# Patient Record
Sex: Female | Born: 1940 | Race: White | Hispanic: No | Marital: Single | Smoking: Never smoker
Health system: Southern US, Community
[De-identification: ages and names within clinical notes are randomized; demographics above are authoritative.]

## PROBLEM LIST (undated history)

## (undated) DIAGNOSIS — M1990 Unspecified osteoarthritis, unspecified site: Secondary | ICD-10-CM

## (undated) DIAGNOSIS — K219 Gastro-esophageal reflux disease without esophagitis: Secondary | ICD-10-CM

## (undated) DIAGNOSIS — F0390 Unspecified dementia without behavioral disturbance: Secondary | ICD-10-CM

## (undated) DIAGNOSIS — R1310 Dysphagia, unspecified: Secondary | ICD-10-CM

## (undated) DIAGNOSIS — N189 Chronic kidney disease, unspecified: Secondary | ICD-10-CM

## (undated) DIAGNOSIS — I10 Essential (primary) hypertension: Secondary | ICD-10-CM

## (undated) DIAGNOSIS — F419 Anxiety disorder, unspecified: Secondary | ICD-10-CM

## (undated) DIAGNOSIS — R4182 Altered mental status, unspecified: Secondary | ICD-10-CM

## (undated) DIAGNOSIS — E162 Hypoglycemia, unspecified: Secondary | ICD-10-CM

## (undated) DIAGNOSIS — E785 Hyperlipidemia, unspecified: Secondary | ICD-10-CM

## (undated) DIAGNOSIS — F329 Major depressive disorder, single episode, unspecified: Secondary | ICD-10-CM

## (undated) DIAGNOSIS — F32A Depression, unspecified: Secondary | ICD-10-CM

## (undated) DIAGNOSIS — F319 Bipolar disorder, unspecified: Secondary | ICD-10-CM

## (undated) DIAGNOSIS — R600 Localized edema: Secondary | ICD-10-CM

## (undated) DIAGNOSIS — D696 Thrombocytopenia, unspecified: Secondary | ICD-10-CM

## (undated) DIAGNOSIS — R531 Weakness: Secondary | ICD-10-CM

## (undated) HISTORY — DX: Hyperlipidemia, unspecified: E78.5

## (undated) HISTORY — DX: Unspecified osteoarthritis, unspecified site: M19.90

## (undated) HISTORY — DX: Essential (primary) hypertension: I10

## (undated) HISTORY — DX: Major depressive disorder, single episode, unspecified: F32.9

## (undated) HISTORY — DX: Depression, unspecified: F32.A

## (undated) HISTORY — PX: ABDOMINAL HYSTERECTOMY: SHX81

---

## 1987-03-16 HISTORY — PX: BACK SURGERY: SHX140

## 1993-08-15 HISTORY — PX: CHOLECYSTECTOMY: SHX55

## 1995-04-04 HISTORY — PX: OTHER SURGICAL HISTORY: SHX169

## 1995-04-04 HISTORY — PX: PARTIAL HYSTERECTOMY: SHX80

## 1995-05-16 HISTORY — PX: BREAST BIOPSY: SHX20

## 1997-04-28 HISTORY — PX: ANKLE SURGERY: SHX546

## 2001-02-05 ENCOUNTER — Encounter (HOSPITAL_COMMUNITY): Admission: RE | Admit: 2001-02-05 | Discharge: 2001-03-07 | Payer: Self-pay | Admitting: Family Medicine

## 2001-05-27 ENCOUNTER — Encounter: Payer: Self-pay | Admitting: Specialist

## 2001-05-27 ENCOUNTER — Ambulatory Visit (HOSPITAL_COMMUNITY): Admission: RE | Admit: 2001-05-27 | Discharge: 2001-05-27 | Payer: Self-pay | Admitting: *Deleted

## 2002-06-02 ENCOUNTER — Encounter: Payer: Self-pay | Admitting: Specialist

## 2002-06-02 ENCOUNTER — Ambulatory Visit (HOSPITAL_COMMUNITY): Admission: RE | Admit: 2002-06-02 | Discharge: 2002-06-02 | Payer: Self-pay | Admitting: Specialist

## 2002-10-26 ENCOUNTER — Ambulatory Visit (HOSPITAL_COMMUNITY): Admission: RE | Admit: 2002-10-26 | Discharge: 2002-10-26 | Payer: Self-pay | Admitting: Internal Medicine

## 2002-10-26 HISTORY — PX: COLONOSCOPY: SHX174

## 2003-10-10 ENCOUNTER — Ambulatory Visit (HOSPITAL_COMMUNITY): Admission: RE | Admit: 2003-10-10 | Discharge: 2003-10-10 | Payer: Self-pay | Admitting: Specialist

## 2003-11-02 ENCOUNTER — Ambulatory Visit (HOSPITAL_COMMUNITY): Admission: RE | Admit: 2003-11-02 | Discharge: 2003-11-02 | Payer: Self-pay | Admitting: Specialist

## 2004-01-30 ENCOUNTER — Ambulatory Visit: Payer: Self-pay | Admitting: *Deleted

## 2004-07-24 ENCOUNTER — Ambulatory Visit: Payer: Self-pay | Admitting: Psychiatry

## 2004-10-10 ENCOUNTER — Ambulatory Visit (HOSPITAL_COMMUNITY): Admission: RE | Admit: 2004-10-10 | Discharge: 2004-10-10 | Payer: Self-pay | Admitting: Specialist

## 2005-01-22 ENCOUNTER — Ambulatory Visit: Payer: Self-pay | Admitting: Psychiatry

## 2005-04-23 ENCOUNTER — Ambulatory Visit: Payer: Self-pay | Admitting: Psychiatry

## 2005-07-18 ENCOUNTER — Ambulatory Visit (HOSPITAL_COMMUNITY): Payer: Self-pay | Admitting: Psychiatry

## 2005-08-13 ENCOUNTER — Ambulatory Visit (HOSPITAL_COMMUNITY): Admission: RE | Admit: 2005-08-13 | Discharge: 2005-08-13 | Payer: Self-pay | Admitting: Family Medicine

## 2005-09-01 ENCOUNTER — Encounter: Admission: RE | Admit: 2005-09-01 | Discharge: 2005-09-01 | Payer: Self-pay | Admitting: Neurology

## 2005-10-15 ENCOUNTER — Ambulatory Visit (HOSPITAL_COMMUNITY): Payer: Self-pay | Admitting: Psychiatry

## 2005-11-05 ENCOUNTER — Encounter (HOSPITAL_COMMUNITY): Admission: RE | Admit: 2005-11-05 | Discharge: 2005-12-05 | Payer: Self-pay | Admitting: Neurology

## 2005-11-19 ENCOUNTER — Ambulatory Visit (HOSPITAL_COMMUNITY): Admission: RE | Admit: 2005-11-19 | Discharge: 2005-11-19 | Payer: Self-pay | Admitting: Family Medicine

## 2005-12-05 ENCOUNTER — Encounter (HOSPITAL_COMMUNITY): Admission: RE | Admit: 2005-12-05 | Discharge: 2006-01-04 | Payer: Self-pay | Admitting: Neurology

## 2006-01-09 ENCOUNTER — Ambulatory Visit (HOSPITAL_COMMUNITY): Payer: Self-pay | Admitting: Psychiatry

## 2006-05-01 ENCOUNTER — Ambulatory Visit (HOSPITAL_COMMUNITY): Payer: Self-pay | Admitting: Psychiatry

## 2006-08-28 ENCOUNTER — Ambulatory Visit (HOSPITAL_COMMUNITY): Payer: Self-pay | Admitting: Psychiatry

## 2006-11-18 ENCOUNTER — Ambulatory Visit (HOSPITAL_COMMUNITY): Payer: Self-pay | Admitting: Psychiatry

## 2006-12-05 ENCOUNTER — Ambulatory Visit (HOSPITAL_COMMUNITY): Admission: RE | Admit: 2006-12-05 | Discharge: 2006-12-05 | Payer: Self-pay | Admitting: Family Medicine

## 2007-01-08 ENCOUNTER — Ambulatory Visit (HOSPITAL_COMMUNITY): Admission: RE | Admit: 2007-01-08 | Discharge: 2007-01-08 | Payer: Self-pay | Admitting: Family Medicine

## 2007-01-22 ENCOUNTER — Ambulatory Visit (HOSPITAL_COMMUNITY): Admission: RE | Admit: 2007-01-22 | Discharge: 2007-01-22 | Payer: Self-pay | Admitting: Family Medicine

## 2007-02-05 ENCOUNTER — Encounter (HOSPITAL_COMMUNITY): Admission: RE | Admit: 2007-02-05 | Discharge: 2007-02-10 | Payer: Self-pay

## 2007-02-11 ENCOUNTER — Ambulatory Visit: Payer: Self-pay | Admitting: Orthopedic Surgery

## 2007-02-12 ENCOUNTER — Ambulatory Visit (HOSPITAL_COMMUNITY): Payer: Self-pay | Admitting: Psychiatry

## 2007-02-13 DIAGNOSIS — Z8679 Personal history of other diseases of the circulatory system: Secondary | ICD-10-CM | POA: Insufficient documentation

## 2007-02-18 ENCOUNTER — Ambulatory Visit: Payer: Self-pay | Admitting: Orthopedic Surgery

## 2007-02-27 ENCOUNTER — Ambulatory Visit (HOSPITAL_COMMUNITY): Admission: RE | Admit: 2007-02-27 | Discharge: 2007-02-27 | Payer: Self-pay | Admitting: Orthopedic Surgery

## 2007-02-27 ENCOUNTER — Ambulatory Visit: Payer: Self-pay | Admitting: Orthopedic Surgery

## 2007-03-03 ENCOUNTER — Ambulatory Visit: Payer: Self-pay | Admitting: Orthopedic Surgery

## 2007-03-03 DIAGNOSIS — L02419 Cutaneous abscess of limb, unspecified: Secondary | ICD-10-CM

## 2007-03-03 DIAGNOSIS — L03119 Cellulitis of unspecified part of limb: Secondary | ICD-10-CM

## 2007-03-11 ENCOUNTER — Ambulatory Visit: Payer: Self-pay | Admitting: Orthopedic Surgery

## 2007-03-16 ENCOUNTER — Ambulatory Visit: Payer: Self-pay | Admitting: Orthopedic Surgery

## 2007-03-19 ENCOUNTER — Encounter: Payer: Self-pay | Admitting: Orthopedic Surgery

## 2007-03-23 ENCOUNTER — Ambulatory Visit: Payer: Self-pay | Admitting: Orthopedic Surgery

## 2007-04-06 ENCOUNTER — Ambulatory Visit: Payer: Self-pay | Admitting: Orthopedic Surgery

## 2007-04-08 ENCOUNTER — Encounter: Payer: Self-pay | Admitting: Orthopedic Surgery

## 2007-04-14 ENCOUNTER — Ambulatory Visit (HOSPITAL_COMMUNITY): Payer: Self-pay | Admitting: Psychiatry

## 2007-04-17 ENCOUNTER — Encounter: Payer: Self-pay | Admitting: Orthopedic Surgery

## 2007-05-04 ENCOUNTER — Ambulatory Visit: Payer: Self-pay | Admitting: Orthopedic Surgery

## 2007-05-12 ENCOUNTER — Telehealth: Payer: Self-pay | Admitting: Orthopedic Surgery

## 2007-06-04 ENCOUNTER — Ambulatory Visit: Payer: Self-pay | Admitting: Orthopedic Surgery

## 2007-06-16 ENCOUNTER — Encounter: Payer: Self-pay | Admitting: Orthopedic Surgery

## 2007-07-28 ENCOUNTER — Ambulatory Visit (HOSPITAL_COMMUNITY): Payer: Self-pay | Admitting: Psychiatry

## 2007-09-24 ENCOUNTER — Ambulatory Visit (HOSPITAL_COMMUNITY): Payer: Self-pay | Admitting: Psychiatry

## 2007-12-08 ENCOUNTER — Ambulatory Visit (HOSPITAL_COMMUNITY): Admission: RE | Admit: 2007-12-08 | Discharge: 2007-12-08 | Payer: Self-pay | Admitting: Family Medicine

## 2007-12-24 ENCOUNTER — Ambulatory Visit (HOSPITAL_COMMUNITY): Payer: Self-pay | Admitting: Psychiatry

## 2008-03-24 ENCOUNTER — Ambulatory Visit (HOSPITAL_COMMUNITY): Payer: Self-pay | Admitting: Psychiatry

## 2008-06-23 ENCOUNTER — Ambulatory Visit (HOSPITAL_COMMUNITY): Payer: Self-pay | Admitting: Psychiatry

## 2008-10-11 ENCOUNTER — Ambulatory Visit (HOSPITAL_COMMUNITY): Payer: Self-pay | Admitting: Psychiatry

## 2008-12-12 ENCOUNTER — Ambulatory Visit (HOSPITAL_COMMUNITY): Admission: RE | Admit: 2008-12-12 | Discharge: 2008-12-12 | Payer: Self-pay | Admitting: Family Medicine

## 2009-01-10 ENCOUNTER — Ambulatory Visit (HOSPITAL_COMMUNITY): Payer: Self-pay | Admitting: Psychiatry

## 2009-02-24 ENCOUNTER — Ambulatory Visit (HOSPITAL_COMMUNITY): Admission: RE | Admit: 2009-02-24 | Discharge: 2009-02-24 | Payer: Self-pay | Admitting: Family Medicine

## 2009-02-24 HISTORY — PX: WRIST SURGERY: SHX841

## 2009-04-11 ENCOUNTER — Ambulatory Visit (HOSPITAL_COMMUNITY): Payer: Self-pay | Admitting: Psychiatry

## 2009-07-11 ENCOUNTER — Ambulatory Visit (HOSPITAL_COMMUNITY): Payer: Self-pay | Admitting: Psychiatry

## 2009-08-24 ENCOUNTER — Emergency Department (HOSPITAL_COMMUNITY): Admission: EM | Admit: 2009-08-24 | Discharge: 2009-08-24 | Payer: Self-pay | Admitting: Emergency Medicine

## 2009-10-10 ENCOUNTER — Ambulatory Visit (HOSPITAL_COMMUNITY): Payer: Self-pay | Admitting: Psychiatry

## 2009-11-30 ENCOUNTER — Emergency Department (HOSPITAL_COMMUNITY)
Admission: EM | Admit: 2009-11-30 | Discharge: 2009-11-30 | Payer: Self-pay | Source: Home / Self Care | Admitting: Emergency Medicine

## 2009-12-15 ENCOUNTER — Ambulatory Visit (HOSPITAL_COMMUNITY): Admission: RE | Admit: 2009-12-15 | Discharge: 2009-12-15 | Payer: Self-pay | Admitting: Family Medicine

## 2010-01-09 ENCOUNTER — Ambulatory Visit (HOSPITAL_COMMUNITY): Payer: Self-pay | Admitting: Psychiatry

## 2010-04-10 ENCOUNTER — Ambulatory Visit (HOSPITAL_COMMUNITY): Payer: Self-pay | Admitting: Psychiatry

## 2010-06-03 ENCOUNTER — Encounter: Payer: Self-pay | Admitting: Specialist

## 2010-06-03 ENCOUNTER — Encounter: Payer: Self-pay | Admitting: Family Medicine

## 2010-07-10 ENCOUNTER — Encounter (INDEPENDENT_AMBULATORY_CARE_PROVIDER_SITE_OTHER): Payer: BC Managed Care – PPO | Admitting: Psychiatry

## 2010-07-10 ENCOUNTER — Encounter (HOSPITAL_COMMUNITY): Payer: Medicare Other | Admitting: Psychiatry

## 2010-07-10 DIAGNOSIS — F331 Major depressive disorder, recurrent, moderate: Secondary | ICD-10-CM

## 2010-09-25 NOTE — Op Note (Signed)
NAME:  Amy Clay, DEMMA              ACCOUNT NO.:  [id number]   MEDICAL RECORD NO.:  [id number]          PATIENT TYPE:  AMB   LOCATION:  DAY                           FACILITY:  [facility]   PHYSICIAN:  Vickki Hearing, M.D.DATE OF BIRTH:  1941/02/20   DATE OF PROCEDURE:  02/27/2007  DATE OF DISCHARGE:                               OPERATIVE REPORT   PREOPERATIVE DIAGNOSIS:  Right ankle infection.   PROCEDURE:  Incision and drainage, removal of hardware right ankle.   SURGEON:  Vickki Hearing, M.D., no assistants.   ANESTHETIC:  Spinal.   FINDINGS:  Healed old lateral malleolus fracture, intact hardware.  Cultures were taken and specimen sent for micro.  Minimal blood loss.  No complications.   HISTORY:  This is a 70 year old female 10 years status post open  treatment internal fixation of bimalleolar ankle fracture on the right.  She was in her usual state of health until some time in July 2008 when  she developed redness and a scab over the right ankle incision.  When  the scab came off, the wound opened up.  She was initially seen by her  primary care physician and treated with dicloxacillin and topical cream  and when that did not improve her symptoms, she was treated at the wound  center in [place], [place].  After debridement and curetting of the  wound, doctor at the wound center noted that he felt metal and sought my  advice.  We asked her to come in and we treated her with the week of  Levaquin, she did not improve and she was scheduled for surgery.   After proper identification and marking of the surgical site.  The  patient was taken to the operating room for spinal anesthetic.  After  successful spinal anesthesia, right lower extremity was prepped and  draped using sterile technique.  Time-out procedure was then completed.  The previous incision was taken advantage of and taken down to the  metal.  The metal was removed, the wound was debrided, the wound was  irrigated with copious amounts of saline.  The screw sites were debrided  with the curette.  The wound was closed.  The area of opening was found  to be over one of the screws.  This area was sharply debrided.   Wound was then dressed sterilely and the patient was taken to recovery  in stable condition.  She was placed in an air cast and follow-up  scheduled for within one week for dressing change.      Vickki Hearing, M.D.  Electronically Signed    SEH/MEDQ  D:  03/25/2007  T:  03/25/2007  Job:  [id number]

## 2010-09-25 NOTE — H&P (Signed)
NAME:  Amy Clay, Amy Clay              ACCOUNT NO.:  [id number]   MEDICAL RECORD NO.:  [id number]          PATIENT TYPE:  AMB   LOCATION:  DAY                           FACILITY:  [facility]   PHYSICIAN:  Vickki Hearing, M.D.DATE OF BIRTH:  December 25, 1940   DATE OF ADMISSION:  DATE OF DISCHARGE:  [facility]                              HISTORY & PHYSICAL   CHIEF COMPLAINT:  Non-healing wound right ankle   This is a 69 year old female, 10 years status post open treatment  internal fixation bimalleolar fracture, right ankle.  She was in her  usual state of health until sometime July of 2008 when she developed  redness and a scab over the right ankle incision.  When the scab came  off, the wound opened up.  She was initially seen by her primary care  physician Dr. Nobie Putnam who treated her with dicloxacillin and topical  cream.  When that did not ameliorate the symptoms, she went to the wound  center.  After debridements and curetting of the wound, the doctor at  the wound center said that he felt metal and he sought my advice.  We  saw her, we gave her some Levaquin for approximately a week.  It did not  work and we scheduled her for surgery.   REVIEW OF SYSTEMS:  Fatigue, reflux, weakness, tremor, unsteady gait,  joint pain, depression, mood swings, anxiety, panic attack, cataract,  sinusitis, worsening sinus problems.  Denies chest pain, shortness of  breath or urinary symptoms.   ALLERGIES:  PENICILLIN, KEFLEX   PAST MEDICAL HISTORY:  She has a history of acid reflux, nervous  disorder, hypertension.   MEDICATIONS:  She takes Celebrex, lithium, Hytrin, Paxil, K-Dur, Bayer  aspirin, Vesicare, hydrochlorothiazide, Prilosec, Klonopin, Cleocin.   PAST SURGICAL HISTORY:  She has had a back surgery in 1988,  cholecystectomy in 1995, partial hysterectomy in 1996, breast biopsy on  the right in 1997, fractured right ankle 1998.   FAMILY HISTORY:  Notable for arthritis and diabetes.   SOCIAL  HISTORY:  She is single and retired, does not smoke or drink.   PHYSICAL EXAMINATION:  She weighs approximately 256 pounds, pulse is 70,  respiratory rate is 18.  APPEARANCE:  Large body habitus, no deformities, adequate grooming,  normal development, decent nutrition.  EXTREMITIES:  Observations:  No swelling, mild varicose veins,  palpation, temperature normal.  No tenderness, mild edema, and pulses  were normal.  Gait and station are normal.  There is a 2-3 mm wound  which has opened up over the right ankle.  There is no visible hardware.  There is drainage.  Ankle motion is normal.  Ankle stability is normal.  Ankle muscle strength and tone surrounding the ankle is normal.  Skin as  stated.  Sensation is normal.  NEURO:  She is oriented x3.  Mood and affect normal.   X-rays are showing that the medial and lateral hardware are intact, with  no lucency to suggest infection of the hardware.   DIAGNOSES:  Cellulitis infection of the right ankle.   PLAN:  Incision, drainage, culture, removal of hardware,  and packing of  the wound and wound care.      Vickki Hearing, M.D.  Electronically Signed     SEH/MEDQ  D:  02/26/2007  T:  02/26/2007  Job:  [id number]   cc:   [facility]

## 2010-09-28 NOTE — Consult Note (Signed)
NAME:  Amy Clay, Amy Clay                        ACCOUNT NO.:  [id number]   MEDICAL RECORD NO.:  [id number]                  PATIENT TYPE:   LOCATION:                                       FACILITY:  [facility]   PHYSICIAN:  Lionel December, M.D.                 DATE OF BIRTH:  08-19-1940   DATE OF CONSULTATION:  10/13/2002  DATE OF DISCHARGE:                                   CONSULTATION   REPORT TITLE:  GASTROENTEROLOGY CONSULTATION.   REFERRING PHYSICIAN:  Corrie Mckusick, M.D.   REASON FOR CONSULTATION:  Diarrhea, colonoscopy.   HISTORY OF PRESENT ILLNESS:  Amy Clay is a pleasant 70 year old Caucasian  female with a 1-2 year history of intermittent abdominal cramps associated  with diarrhea.  Several weeks ago she developed perfuse diarrhea.  She  developed worsening diarrhea.  She had diarrhea for three days in a row,  having 3-4 watery stools at a time.  She states other people around her were  sick including  daycare workers and some of the children.  It was suspected  that she had a viral gastroenteritis.  The symptoms persisted for several  days; therefore, stool studies were obtained.  Giardia, Cryptosporidium, C.  diff and culture were negative.  She was given Lomotil as needed for  diarrhea.   She tells me she continues to have intermittent bouts of diarrhea at this  point.  She notes this particularly if she eats out at restaurants.  She  will have postprandial cramps followed by diarrhea, cramping resolves with  defecation.  She also has multiple loose stools in the morning time.  She is  really constipated.  Denies any fever or chills, nausea or vomiting.  She  occasionally has some heartburn and takes Mylanta as needed.  Denies any  recent changes in her medications.  She has never had a colonoscopy.   CURRENT MEDICATIONS:  1. Ativan 0.5 mg q.i.d.  2. Celebrex 200 mg daily.  3. Lithium Carbonate 1/2 tablet b.i.d.  4. Hytrin 5 mg daily.  5. Lomotil two tablets  t.i.d. p.r.n.  6. K-Dur 10 mEq daily.  7. Detrol 4 mg daily.  8. Paxil 37.5 mg daily.  9. Hydrochlorothiazide 25 mg daily.  10.      Mylanta p.r.n.   ALLERGIES:  1. PENICILLIN.  2. KEFLEX.   PAST MEDICAL HISTORY:  1. Hypertension.  2. Anxiety, nerve problems.  3. History of IBS.  4. Hiatal hernia.   PAST SURGICAL HISTORY:  1. She had back surgery in 1988.  2. Cholecystectomy.  3. Hysterectomy.  4. Right ankle repair.  5. Right breast biopsy which was benign.   FAMILY HISTORY:  Mother and father both had strokes.  She has a sister with  diabetes mellitus.  No family history of colorectal cancer.   SOCIAL HISTORY:  She is single, has no children.  She has one sister.  She  is employed as a Administrator, sports.  Denies any tobacco or alcohol use or  abuse.   REVIEW OF SYSTEMS:  Please see HPI for GI.  GENERAL:  Denies any weight  loss.  CARDIOPULMONARY:  Denies any chest pain or shortness of breath.   PHYSICAL EXAMINATION:  VITAL SIGNS:  Weight 62.6, height 5 feet 6 inches,  temperature 98.6, blood pressure 138/82, pulse 76.  GENERAL:  A pleasant well-nourished, well-developed Caucasian female in no  acute distress.  SKIN:  Warm and dry.  No jaundice.  HEENT:  Pupils equal, round and reactive to light.  Conjunctivae pink.  Sclerae nonicteric.  Oropharyngeal mucosa moist and pink.  No lesions,  erythema or exudate.  No lymphadenopathy, thyromegaly.  CHEST:  Lungs are clear to auscultation.  CARDIAC:  Reveals regular, rate and rhythm.  Normal S1, S2.  No murmurs,  rubs or gallops.  ABDOMEN:  Positive bowel sounds.  Obese but symmetrical .  Soft, nontender;  no organomegaly or mass.  EXTREMITIES:  No edema.   LABORATORY DATA:  The patient reports a normal thyroid blood test recently.  She also recently had lithium level checked.   IMPRESSION:  Chronic intermittent abdominal crampy sensation with diarrhea.  Symptoms most consistent with irritable bowel syndrome.  Recently she  may  have had a bout of viral gastroenteritis which seems to have resolved.  She  seems to be back at her baseline at this point.  She has never had a  colonoscopy.  I recommended one for both surveillance and diagnostic  purposes.   PLAN:  1. Colonoscopy in the near future.  Discussed risks and alternatives with     the patient.  She is willing to proceed.  2. __________ one sublingual q.a.c. and at bedtime p.r.n. #18 samples given.     She may continue to use Lomotil     p.r.n. if this does not seem to help.  3. Further recommendations to follow.   I want to thank Dr. Assunta Found for allowing [place] to take part in the care of  this patient.     Leanna Battles. Melvyn Neth, P.A.-C                   Lionel December, M.D.    LSL/MEDQ  D:  10/13/2002  T:  10/13/2002  Job:  [id number]   cc:   Corrie Mckusick, M.D.  [address] [place] [place]  [place]  [place] [address]  Fax: [phone]

## 2010-09-28 NOTE — Op Note (Signed)
   NAME:  Amy Clay, Amy Clay                        ACCOUNT NO.:  [id number]   MEDICAL RECORD NO.:  [id number]                   PATIENT TYPE:  AMB   LOCATION:  DAY                                  FACILITY:  [facility]   PHYSICIAN:  Lionel December, M.D.                 DATE OF BIRTH:  [birth year]   DATE OF PROCEDURE:  10/26/2002  DATE OF DISCHARGE:                                 OPERATIVE REPORT   PROCEDURE:  Total colonoscopy.   INDICATIONS FOR PROCEDURE:  Ms. Donnell is a 70 year old Caucasian female who  has chronic/intermittent nonbloody diarrhea possibly due to IBS.  She is  undergoing colonoscopy both for diagnostic and screening purposes.  The  procedure was reviewed with the patient, and informed consent was obtained.   PREOPERATIVE MEDICATIONS:  Demerol 50 mg IV, Versed 5 mg IV in divided  doses.   FINDINGS:  The procedure was performed in the endoscopy suite.  The  patient's vital signs and O2 saturations were monitored during the procedure  and remained stable.  The patient was placed in the left lateral position  and rectal examination performed.  No abnormality noted on external or  digital exam.  The Olympus videoduodenoscope was placed into the rectum and  advanced to the region of the sigmoid colon and beyond.  The preparation was  satisfactory.  The scope was passed to the cecum which was identified by the  appendiceal orifice and ileocecal valve.  Pictures were taken for the  record.  As the scope was withdrawn, the colonic mucosa was once again  carefully examined and was normal throughout.  The rectal mucosa similarly  was normal.  The scope was retroflexed to examine the anorectal junction,  and small hemorrhoids were noted below the dentate line.  The endoscope was  straightened and withdrawn.  The patient tolerated the procedure well.   FINAL DIAGNOSIS:  Small external hemorrhoids.  Otherwise normal colonoscopy.   I feel her symptoms are secondary to irritable bowel  syndrome.    RECOMMENDATIONS:  1. High fiber diet.  2. Citrucel one tablespoon full daily.  3. NuLev one sublingual t.i.d. p.r.n.  Prescription given for 100.                                               Lionel December, M.D.    NR/MEDQ  D:  10/26/2002  T:  10/26/2002  Job:  [id number]   cc:   Kirk Ruths, M.D.  [address]  [place]  [place] [address]  Fax: [phone]

## 2010-10-04 ENCOUNTER — Encounter (INDEPENDENT_AMBULATORY_CARE_PROVIDER_SITE_OTHER): Payer: Medicare Other | Admitting: Psychiatry

## 2010-10-04 DIAGNOSIS — F331 Major depressive disorder, recurrent, moderate: Secondary | ICD-10-CM

## 2010-10-10 ENCOUNTER — Other Ambulatory Visit (HOSPITAL_COMMUNITY): Payer: Self-pay | Admitting: Internal Medicine

## 2010-10-10 ENCOUNTER — Ambulatory Visit (HOSPITAL_COMMUNITY)
Admission: RE | Admit: 2010-10-10 | Discharge: 2010-10-10 | Disposition: A | Discharge status: Home / Self Care | Payer: Medicare Other | Source: Ambulatory Visit | Attending: Internal Medicine | Admitting: Internal Medicine

## 2010-10-10 DIAGNOSIS — M25519 Pain in unspecified shoulder: Secondary | ICD-10-CM

## 2010-10-10 DIAGNOSIS — M898X9 Other specified disorders of bone, unspecified site: Secondary | ICD-10-CM | POA: Insufficient documentation

## 2010-11-01 ENCOUNTER — Encounter (INDEPENDENT_AMBULATORY_CARE_PROVIDER_SITE_OTHER): Payer: Medicare Other | Admitting: Psychiatry

## 2010-11-01 DIAGNOSIS — F331 Major depressive disorder, recurrent, moderate: Secondary | ICD-10-CM

## 2010-11-04 NOTE — Group Therapy Note (Signed)
NAME:  Amy Clay, Amy Clay              ACCOUNT NO.:  [id number]  MEDICAL RECORD NO.:  [id number]  LOCATION:  [facility]                           FACILITY:  [facility]  PHYSICIAN:  Nykiah Ma T. Reeve Mallo, M.D.   DATE OF BIRTH:  12-14-1940                                PROGRESS NOTE   The patient came in today with her sister earlier than her scheduled appointment.  Sister reported that the patient fell almost 5 weeks ago when she lost her balance and sustained back injuries.  She was seen by her primary care doctor, Dr. Renette Butters, at [facility].  She was given pain medication; however, she continued to have a lot of back pain, jerky movements and difficulty coming out of her chair and bed. Recently she was started on steroids, which make her more anxious, nervous, and generalized pain.  Yesterday she visited the doctor, who gave Vistaril 50 mg IM to relieve anxiety.  Though the patient reported that that medication somewhat calmed her down, she continued to have jerky movements, generalized pain, and was afraid that she may fall again.  She has taken some extra Ativan, which has helped her a lot and controlled these jerky movements.  The patient denies any depressive thoughts or any sleep changes but endorses increased anxiety and nervousness that she may fall again.  Her blood pressure is 120/70.  Her pulse is 80.  She reports no change in her appetite or any behavior. Her labs from May 21 were reviewed.  Her lithium level was 0.27.  Her CBC and chemistries were within normal limits except for hemoglobin 11.8.  The patient is now scheduled to see an orthopedic doctor as Dr. Renette Butters believes she may have a rotator cuff injury.  The patient is concerned that due to the jerky movements she may not sit in an MRI exam and wants something to help these jerky moments  MENTAL STATUS EXAM: The patient is calm, pleasant, cooperative, maintaining good eye contact.  Her speech is soft but clear and coherent.   She has difficulty changing her posture due to significant pain.  She also feels tired and reports an anxious and nervous mood.  Her affect is constricted, but she denies any auditory hallucinations, suicidal thoughts or homicidal thoughts.  There is no psychosis present.  She is alert and oriented x3. She is slow to answer questions and taking more time to change her posture, but her insight, judgment, impulse control are okay.  DIAGNOSIS: AXIS I:  Major depressive disorder, recurrent.  PLAN: At this time, I have recommended to take Ativan 0.5 mg as needed to help those jerky movements and nervousness.  I also talked to Dr. Gwendalyn Ege office and talked to the PA  to consider getting a neurology consult if she continues to have these jerky movements.  I do believe the steroid may have caused the side effects.  The patient has recently stopped the steroids as she finished the tapering dose.  I recommended to continue to monitor closely.  If these jerky movements do not go away in the next 2 weeks, then she should consider getting a neurology consult.  She is also scheduled to see orthopedic  doctor and possibly will get more tests done to rule out any rotator cuff injury.  She has been on a low-dose lithium for a very long time.  There is a small possibility that lithium may cause these jerky movements; however, it is unlikely since the patient has been on this low-dose medication for many years.  However, I will not rule out if we do not find any other possible etiology of jerky movements to stop the lithium and try a different medication.  At this time, the patient is also very reluctant to stop the lithium.  For now, we will continue Paxil 40 mg daily, lithium 150 daily, and we will increase the Ativan 0.5 mg up to 4 times a day.  I explained the risks and benefits of medication, especially taking more time to change her posture as Ativan can cause sometimes postural hypotension.  She  is scheduled to see me in 6 weeks.  I recommended to give [place] a call if she has any question or concern.     Aislyn Hayse T. Lolly Mustache, M.D.     STA/MEDQ  D:  11/01/2010  T:  11/01/2010  Job:  [id number]  Electronically Signed by Kathryne Sharper M.D. on 11/04/2010 11:55:16 PM

## 2010-11-13 ENCOUNTER — Ambulatory Visit (INDEPENDENT_AMBULATORY_CARE_PROVIDER_SITE_OTHER): Payer: Medicare Other | Admitting: Orthopedic Surgery

## 2010-11-13 ENCOUNTER — Encounter: Payer: Self-pay | Admitting: Orthopedic Surgery

## 2010-11-13 VITALS — Resp 22 | Ht 66.0 in | Wt 218.0 lb

## 2010-11-13 DIAGNOSIS — M75101 Unspecified rotator cuff tear or rupture of right shoulder, not specified as traumatic: Secondary | ICD-10-CM | POA: Insufficient documentation

## 2010-11-13 DIAGNOSIS — M719 Bursopathy, unspecified: Secondary | ICD-10-CM

## 2010-11-13 MED ORDER — METHYLPREDNISOLONE ACETATE 40 MG/ML IJ SUSP: 40.0000 mg | Freq: Once | INTRAMUSCULAR | Status: DC

## 2010-11-13 NOTE — Procedures (Signed)
Separately identifiable procedure report  Informed consent was obtained verbally.  Time out was taken.  RIGHT shoulder was injected subacromial space.   Alcohol was used to prep the shoulder, along with ethyl chloride.  40 mg of Depo-Medrol and 3 cc of 1% lidocaine was injected into the subacromial space.  There were no complications 

## 2010-11-13 NOTE — Patient Instructions (Signed)
You have received a steroid shot. 15% of patients experience increased pain at the injection site with in the next 24 hours. This is best treated with ice and tylenol extra strength 2 tabs every 8 hours. If you are still having pain please call the office.    

## 2010-11-13 NOTE — Progress Notes (Signed)
  New patient  RIGHT shoulder pain after a fall on May 25  70 year old female complains of dull throbbing 6/10 intermittent pain in the RIGHT arm which seems to be improving.  She does have some difficulty elevating the arm complaints of 6/10 pain.  Her pain is partially relieved by the IM shot, and the midzone and hydrocodone 5 mg  She tells [place] on the review of systems that she's had some unexpected weight loss and fatigue as well as eye pain and shortness of breath she does snore at night and she has some nausea and constipation there is also a history of frequency and urgency itching of the skin numbness unsteady gait and tremors  Nervousness anxiety depression easy bruising excessive thirst and urination as well as seasonal ALLERGY  Vital signs are stable as recorded  General appearance is normal  The patient is alert and oriented x3  The patient's mood and affect are normal  Gait assessment: with a walker  The cardiovascular exam reveals normal pulses and temperature without edema swelling.  The lymphatic system is negative for palpable lymph nodes  The sensory exam is normal.  There are no pathologic reflexes.  Balance is abnormal.   Exam of the right shoulder  Inspection tenderness inferior to the posterior border of the acromion  Range of motion active flexion 150 passive normal  Stability normal  Strength 4/5 Skin normal   Hospital film and report indicates prominent subacromial spur RIGHT shoulder.  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  She may have a small rotator cuff tear but it is nonoperative and she still has greater than 90 of forward elevation.  I gave her a subacromial injection asked her to do some gentle exercises with active range of motion and followup with [place] as needed

## 2010-12-27 ENCOUNTER — Encounter (INDEPENDENT_AMBULATORY_CARE_PROVIDER_SITE_OTHER): Payer: Medicare Other | Admitting: Psychiatry

## 2010-12-27 DIAGNOSIS — F331 Major depressive disorder, recurrent, moderate: Secondary | ICD-10-CM

## 2011-01-16 ENCOUNTER — Other Ambulatory Visit (HOSPITAL_COMMUNITY): Payer: Self-pay | Admitting: Family Medicine

## 2011-01-16 DIAGNOSIS — Z139 Encounter for screening, unspecified: Secondary | ICD-10-CM

## 2011-01-22 ENCOUNTER — Ambulatory Visit (HOSPITAL_COMMUNITY)
Admission: RE | Admit: 2011-01-22 | Discharge: 2011-01-22 | Disposition: A | Discharge status: Home / Self Care | Payer: Medicare Other | Source: Ambulatory Visit | Attending: Family Medicine | Admitting: Family Medicine

## 2011-01-22 DIAGNOSIS — Z1231 Encounter for screening mammogram for malignant neoplasm of breast: Secondary | ICD-10-CM | POA: Insufficient documentation

## 2011-01-22 DIAGNOSIS — Z139 Encounter for screening, unspecified: Secondary | ICD-10-CM

## 2011-02-20 LAB — WOUND CULTURE: Gram Stain: NONE SEEN

## 2011-02-20 LAB — CBC
HCT: 38.5
Hemoglobin: 12.8
MCHC: 33.4
MCV: 89.4
Platelets: 212
RDW: 14.6 — ABNORMAL HIGH

## 2011-02-20 LAB — BASIC METABOLIC PANEL
CO2: 29
GFR calc non Af Amer: 50 — ABNORMAL LOW
Glucose, Bld: 83
Potassium: 4.1

## 2011-02-20 LAB — ANAEROBIC CULTURE

## 2011-03-07 ENCOUNTER — Encounter (INDEPENDENT_AMBULATORY_CARE_PROVIDER_SITE_OTHER): Payer: Medicare Other | Admitting: Psychiatry

## 2011-03-07 DIAGNOSIS — F331 Major depressive disorder, recurrent, moderate: Secondary | ICD-10-CM

## 2011-04-27 ENCOUNTER — Other Ambulatory Visit (HOSPITAL_COMMUNITY): Payer: Self-pay | Admitting: Psychiatry

## 2011-05-17 DIAGNOSIS — N318 Other neuromuscular dysfunction of bladder: Secondary | ICD-10-CM | POA: Diagnosis not present

## 2011-05-30 ENCOUNTER — Ambulatory Visit (INDEPENDENT_AMBULATORY_CARE_PROVIDER_SITE_OTHER): Payer: Medicare Other | Admitting: Psychiatry

## 2011-05-30 ENCOUNTER — Encounter (HOSPITAL_COMMUNITY): Payer: Self-pay | Admitting: Psychiatry

## 2011-05-30 ENCOUNTER — Encounter (HOSPITAL_COMMUNITY): Payer: Medicare Other | Admitting: Psychiatry

## 2011-05-30 VITALS — Wt 215.0 lb

## 2011-05-30 DIAGNOSIS — F331 Major depressive disorder, recurrent, moderate: Secondary | ICD-10-CM

## 2011-05-30 MED ORDER — LITHIUM CARBONATE 150 MG PO CAPS: 150.0000 mg | ORAL_CAPSULE | Freq: Every day | ORAL | Status: DC

## 2011-05-30 MED ORDER — LORAZEPAM 0.5 MG PO TABS: 0.5000 mg | ORAL_TABLET | Freq: Three times a day (TID) | ORAL | Status: DC | PRN

## 2011-05-30 MED ORDER — PAROXETINE HCL 40 MG PO TABS: 40.0000 mg | ORAL_TABLET | ORAL | Status: DC

## 2011-05-30 NOTE — Progress Notes (Signed)
Patient came for her followup appointment. She has been compliant with her medication. She had a very good Christmas. She is happy as her sister now getting sleep study and will eventually get CPAP machine. Patient denies any anxiety and nervousness or crying spells. Her jerky movements and shakes has also been much improved. She is sleeping better. Her primary care physician is Dr. Renette Butters who is prescribing pain medication. Patient does not abuse her pain medication. Overall she's been stable on her psychiatric medication. She takes Ativan 2-3 times a day as needed. She denies any agitation anger or mood swings.  Mental status examination Patient is elderly woman who is casually dressed and fairly groomed. Her thought process is slow but clear and logical. She denies any active or passive suicidal thinking and homicidal thinking. There no psychotic symptoms present. She is slow in her moving and uses walker to avoid fall. Her attention and concentration is okay. She's alert and oriented x3. Her insight judgment and impulse control is okay.  Assessment Maj. depressive disorder  Plan I will continue her current medication. Patient is very reluctant to stop her lithium which really had helped her a lot. At this time patient reported no side effects of medication. I will continue her Paxil and she will continue to take Ativan 0.5 mg 2-3 times as needed. I have explained risks and benefits of medication and I will see her again in 3 months. Patient wanted to have hard copy of prescription.

## 2011-06-20 DIAGNOSIS — B351 Tinea unguium: Secondary | ICD-10-CM | POA: Diagnosis not present

## 2011-06-20 DIAGNOSIS — M79609 Pain in unspecified limb: Secondary | ICD-10-CM | POA: Diagnosis not present

## 2011-06-26 ENCOUNTER — Telehealth (HOSPITAL_COMMUNITY): Payer: Self-pay | Admitting: *Deleted

## 2011-06-27 ENCOUNTER — Encounter (HOSPITAL_COMMUNITY): Payer: Self-pay | Admitting: Psychiatry

## 2011-06-27 ENCOUNTER — Ambulatory Visit (INDEPENDENT_AMBULATORY_CARE_PROVIDER_SITE_OTHER): Payer: Medicare Other | Admitting: Psychiatry

## 2011-06-27 VITALS — BP 110/70 | HR 80

## 2011-06-27 DIAGNOSIS — Z79899 Other long term (current) drug therapy: Secondary | ICD-10-CM

## 2011-06-27 DIAGNOSIS — F331 Major depressive disorder, recurrent, moderate: Secondary | ICD-10-CM

## 2011-06-27 MED ORDER — LORAZEPAM 0.5 MG PO TABS: 0.5000 mg | ORAL_TABLET | Freq: Four times a day (QID) | ORAL | Status: DC | PRN

## 2011-06-27 MED ORDER — LITHIUM CARBONATE 150 MG PO CAPS: 150.0000 mg | ORAL_CAPSULE | Freq: Every day | ORAL | Status: DC

## 2011-06-27 MED ORDER — PAROXETINE HCL 40 MG PO TABS: 40.0000 mg | ORAL_TABLET | ORAL | Status: DC

## 2011-06-27 NOTE — Progress Notes (Signed)
Addended by: Kathryne Sharper T on: 06/27/2011 02:27 PM   Modules accepted: Orders

## 2011-06-27 NOTE — Progress Notes (Signed)
The patient came today with her sister earlier than her scheduled appointment. Yesterday patient sister called and asked to see the patient as she is concerned about her sister who is having crying spells and depressive thoughts. Patient told she's been lately more nervous anxious and has poor concentration and attention. She admitted poor appetite and not sleeping well. It is unclear if she is taking lorazepam 3 times a day which was recommended on her last visit causing this change in her behavior. However on the last visit patient told she uses sometime 2-3 Ativan a day. I told yesterday that she should start taking lorazepam 4 times a day on the phone. Patient and her sister reported since taking 4 times a day her anxiety is much control. She slept last night better. She denies any hallucination, paranoia, agitation or any suicidal thoughts. She reported no side effects of medication.  Past psychiatric history Patient has been seeing psychiatrist since 43 in this office. She is diagnosed with major depressive disorder. She has been stable on lithium and Paxil for many years. Her last psychiatric inpatient treatment was in 90s. She admitted history of mania, depression and psychosis.  Medical history Patient has history of arthritis blood pressure and chronic pain. Her primary care physician is Dr. Renette Butters at [facility]. She had history of jerking movements, fall and generalized pain.  Mental status examination Patient is anxious nervous but cooperative. She maintained good eye contact. Her speech is soft clear and coherent. Her thought process is slow but logical. Her movements are slow due to chronic pain. She denies any active or passive suicidal thinking and homicidal thinking. She denies any auditory or visual hallucination. Her attention and concentration is fair. She appears anxious described her mood is nervous but constricted affect. There were no tremors or shakes noted. She's  alert and oriented x3. Her insight judgment and impulse control is okay.  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 It is possible she may have withdrawal symptoms since she cut down her lorazepam to 3 a day. I will also order blood work including CBC lithium level and metabolic panel. I recommended if patient does not see any improvement with for her as a times a day and she should call us again. I will see her again in 6 weeks. Time spent 30 minutes

## 2011-06-28 DIAGNOSIS — Z79899 Other long term (current) drug therapy: Secondary | ICD-10-CM | POA: Diagnosis not present

## 2011-06-28 LAB — CBC WITH DIFFERENTIAL/PLATELET
Basophils Absolute: 0 10*3/uL (ref 0.0–0.1)
Basophils Relative: 0 % (ref 0–1)
Eosinophils Relative: 2 % (ref 0–5)
HCT: 42.7 % (ref 36.0–46.0)
Lymphocytes Relative: 14 % (ref 12–46)
MCHC: 30.9 g/dL (ref 30.0–36.0)
Monocytes Absolute: 0.5 10*3/uL (ref 0.1–1.0)
Neutro Abs: 4.8 10*3/uL (ref 1.7–7.7)
Platelets: 225 10*3/uL (ref 150–400)
RDW: 13.3 % (ref 11.5–15.5)
WBC: 6.4 10*3/uL (ref 4.0–10.5)

## 2011-06-28 LAB — COMPREHENSIVE METABOLIC PANEL
ALT: 9 U/L (ref 0–35)
AST: 15 U/L (ref 0–37)
Alkaline Phosphatase: 88 U/L (ref 39–117)
BUN: 20 mg/dL (ref 6–23)
Creat: 1.22 mg/dL — ABNORMAL HIGH (ref 0.50–1.10)

## 2011-07-12 DIAGNOSIS — L738 Other specified follicular disorders: Secondary | ICD-10-CM | POA: Diagnosis not present

## 2011-07-12 DIAGNOSIS — L821 Other seborrheic keratosis: Secondary | ICD-10-CM | POA: Diagnosis not present

## 2011-07-12 DIAGNOSIS — L570 Actinic keratosis: Secondary | ICD-10-CM | POA: Diagnosis not present

## 2011-07-15 DIAGNOSIS — M722 Plantar fascial fibromatosis: Secondary | ICD-10-CM | POA: Diagnosis not present

## 2011-07-25 ENCOUNTER — Encounter (HOSPITAL_COMMUNITY): Payer: Self-pay | Admitting: Psychiatry

## 2011-07-25 ENCOUNTER — Ambulatory Visit (INDEPENDENT_AMBULATORY_CARE_PROVIDER_SITE_OTHER): Payer: Medicare Other | Admitting: Psychiatry

## 2011-07-25 DIAGNOSIS — F329 Major depressive disorder, single episode, unspecified: Secondary | ICD-10-CM

## 2011-07-25 MED ORDER — BUSPIRONE HCL 5 MG PO TABS: 5.0000 mg | ORAL_TABLET | Freq: Two times a day (BID) | ORAL | Status: DC

## 2011-07-25 NOTE — Progress Notes (Signed)
Chief complaint I am more anxious  History of presenting illness Patient is 70 year old Caucasian female who came with her sister for her followup appointment. Patient is schedule in earlier appointment, she complained of more anxiety and nervousness. She admitted having more bad days but feeling depressed and anxious. She also endorse some mood lability but her sleep is fine. She denies any auditory or visual hallucination. She denies any crying spells. She denies any feeling of hopelessness or helplessness. She is taking Ativan regularly 3 times a day however her anxiety remains unresolved. Her sister is also concerned about her anxiety. She denies any tremors or side effects. I review her blood work including lithium level which is 0.11 however her creatinine is marginally high.  Past psychiatric history Patient has been seeing psychiatrist since 43 in this office. She is diagnosed with major depressive disorder. She has been stable on lithium and Paxil for many years. Her last psychiatric inpatient treatment was in 90s. She admitted history of mania, depression and psychosis.  Medical history Patient has history of arthritis blood pressure and chronic pain. Her primary care physician is Dr. Renette Butters at [facility]. She had history of jerking movements, fall and generalized pain.  Mental status examination Patient is anxious and nervous but cooperative. She maintained good eye contact. Her speech is soft clear and coherent. Her thought process is slow but logical. Her movements are slow due to chronic pain. She denies any active or passive suicidal thinking and homicidal thinking. She denies any auditory or visual hallucination. Her attention and concentration is fair. There were no tremors or shakes noted. She's alert and oriented x3. Her insight judgment and impulse control is okay.  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 I do believe patient is been more anxious than usual. I will not increase lithium due to high creatinine level. However I will add BuSpar to help the anxiety symptoms. Her creatinine is 1.2, she does not have tremors or any urinary symptoms at this time. She is scheduled to see her primary care physician in few weeks. I recommended to call us if she is a question or concern about the medication or feel worsening of her symptoms. I will see her again in 3 weeks. Time spent 30 minutes a copy of blood results is given to the patient.

## 2011-07-29 DIAGNOSIS — M722 Plantar fascial fibromatosis: Secondary | ICD-10-CM | POA: Diagnosis not present

## 2011-08-20 ENCOUNTER — Ambulatory Visit (HOSPITAL_COMMUNITY): Payer: Medicare Other | Admitting: Psychiatry

## 2011-08-22 ENCOUNTER — Ambulatory Visit (INDEPENDENT_AMBULATORY_CARE_PROVIDER_SITE_OTHER): Payer: Medicare Other | Admitting: Psychiatry

## 2011-08-22 ENCOUNTER — Encounter (HOSPITAL_COMMUNITY): Payer: Self-pay | Admitting: Psychiatry

## 2011-08-22 VITALS — BP 148/84 | HR 67 | Wt 204.0 lb

## 2011-08-22 DIAGNOSIS — F331 Major depressive disorder, recurrent, moderate: Secondary | ICD-10-CM

## 2011-08-22 MED ORDER — PAROXETINE HCL 40 MG PO TABS: 40.0000 mg | ORAL_TABLET | ORAL | Status: DC

## 2011-08-22 MED ORDER — BUSPIRONE HCL 5 MG PO TABS: 5.0000 mg | ORAL_TABLET | Freq: Three times a day (TID) | ORAL | Status: DC

## 2011-08-22 MED ORDER — LORAZEPAM 0.5 MG PO TABS: 0.5000 mg | ORAL_TABLET | Freq: Four times a day (QID) | ORAL | Status: DC | PRN

## 2011-08-22 NOTE — Progress Notes (Signed)
Chief complaint I am doing better.    History of presenting illness Patient is 71 year old Caucasian female who came with her sister for her followup appointment. Patient is taking BuSpar 5 mg twice a day.  She is completely off from lithium due to increased creatinine .  As per sister patient is doing much better.  She is less anxious and less tearful.  She still has some anxiety and nervousness she sleeps better.  She has some residual racing thoughts but she denies any feeling of hopelessness or helplessness .  She's tolerating BuSpar and denies any side effects.  She is concerned about her physical health.  She has not seen her primary care physician in a while .  Patient told that it is very difficult to get appointment with him .  Lately she is concerned about her physical checkup and blood test .  Patient denies any tremors or any side effects of medication.  Her jerky movements are resolved.  Current psychiatric medication BuSpar 5 mg twice a day  lorazepam 0.5 mg 4 times a day Paxil 40 mg daily  Past psychiatric history Patient has been seeing psychiatrist since 41 in this office. She is diagnosed with major depressive disorder. She has been stable on lithium and Paxil for many years. Her last psychiatric inpatient treatment was in 90s. She admitted history of mania, depression and psychosis.  Medical history Patient has history of arthritis blood pressure and chronic pain. Her primary care physician is Dr. Phillips Odor at [facility]. She had history of jerking movements, fall and generalized pain.  Mental status examination Patient is casually dressed and fairly groomed.  She uses walker for walking.  She described her mood is pleasant and her affect is improved from the past.  She maintained good eye contact.  Her speech is slow but fluent clear and coherent.  Her thought process is also slow but logical linear and goal-directed.  Her attention and concentration is fair.  She  has some difficulty recalling old events but overall she is relevant in conversation.  She denies any auditory or visual hallucination.  She denies any active or passive suicidal thinking and homicidal thinking.  There were no paranoia or delusions or psychotic symptoms present at this time.  She's alert and oriented x3.  Her insight judgment and impulse control is okay.  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  Patient is started to feel better since we started BuSpar .  She still has some residual anxiety symptoms.  I recommend to increase BuSpar to 3 times a day.  I also recommend to keep appointment with her primary care physician for and will checkup and blood work .  At this time patient denies any side effects of medication.  I explained risks and benefits of medication.  I will see her again in 8 weeks.  Time spent 30 minutes.

## 2011-08-29 ENCOUNTER — Ambulatory Visit (HOSPITAL_COMMUNITY): Payer: Medicare Other | Admitting: Psychiatry

## 2011-09-09 ENCOUNTER — Other Ambulatory Visit (HOSPITAL_COMMUNITY): Payer: Self-pay | Admitting: Family Medicine

## 2011-09-09 DIAGNOSIS — D649 Anemia, unspecified: Secondary | ICD-10-CM | POA: Diagnosis not present

## 2011-09-09 DIAGNOSIS — E559 Vitamin D deficiency, unspecified: Secondary | ICD-10-CM | POA: Diagnosis not present

## 2011-09-09 DIAGNOSIS — E785 Hyperlipidemia, unspecified: Secondary | ICD-10-CM | POA: Diagnosis not present

## 2011-09-09 DIAGNOSIS — I10 Essential (primary) hypertension: Secondary | ICD-10-CM | POA: Diagnosis not present

## 2011-09-09 DIAGNOSIS — R7301 Impaired fasting glucose: Secondary | ICD-10-CM | POA: Diagnosis not present

## 2011-09-09 DIAGNOSIS — M169 Osteoarthritis of hip, unspecified: Secondary | ICD-10-CM | POA: Diagnosis not present

## 2011-09-09 DIAGNOSIS — Z79899 Other long term (current) drug therapy: Secondary | ICD-10-CM | POA: Diagnosis not present

## 2011-09-09 DIAGNOSIS — Z6832 Body mass index (BMI) 32.0-32.9, adult: Secondary | ICD-10-CM | POA: Diagnosis not present

## 2011-09-09 DIAGNOSIS — R5381 Other malaise: Secondary | ICD-10-CM | POA: Diagnosis not present

## 2011-09-09 DIAGNOSIS — Z139 Encounter for screening, unspecified: Secondary | ICD-10-CM

## 2011-09-09 DIAGNOSIS — Z23 Encounter for immunization: Secondary | ICD-10-CM | POA: Diagnosis not present

## 2011-09-15 ENCOUNTER — Encounter (HOSPITAL_COMMUNITY): Payer: Self-pay

## 2011-09-15 ENCOUNTER — Observation Stay (HOSPITAL_COMMUNITY)
Admission: EM | Admit: 2011-09-15 | Discharge: 2011-09-16 | DRG: 641 | Disposition: A | Discharge status: Home Health | Payer: Medicare Other | Attending: Internal Medicine | Admitting: Internal Medicine

## 2011-09-15 ENCOUNTER — Emergency Department (HOSPITAL_COMMUNITY): Payer: Medicare Other

## 2011-09-15 DIAGNOSIS — M129 Arthropathy, unspecified: Secondary | ICD-10-CM | POA: Diagnosis present

## 2011-09-15 DIAGNOSIS — R404 Transient alteration of awareness: Secondary | ICD-10-CM | POA: Diagnosis not present

## 2011-09-15 DIAGNOSIS — R42 Dizziness and giddiness: Secondary | ICD-10-CM | POA: Diagnosis not present

## 2011-09-15 DIAGNOSIS — I10 Essential (primary) hypertension: Secondary | ICD-10-CM | POA: Diagnosis present

## 2011-09-15 DIAGNOSIS — R0602 Shortness of breath: Secondary | ICD-10-CM | POA: Diagnosis not present

## 2011-09-15 DIAGNOSIS — Z79899 Other long term (current) drug therapy: Secondary | ICD-10-CM

## 2011-09-15 DIAGNOSIS — R55 Syncope and collapse: Secondary | ICD-10-CM | POA: Diagnosis not present

## 2011-09-15 DIAGNOSIS — E861 Hypovolemia: Secondary | ICD-10-CM | POA: Diagnosis not present

## 2011-09-15 DIAGNOSIS — L02419 Cutaneous abscess of limb, unspecified: Secondary | ICD-10-CM

## 2011-09-15 DIAGNOSIS — Z8679 Personal history of other diseases of the circulatory system: Secondary | ICD-10-CM

## 2011-09-15 DIAGNOSIS — R5381 Other malaise: Secondary | ICD-10-CM | POA: Diagnosis not present

## 2011-09-15 DIAGNOSIS — M75101 Unspecified rotator cuff tear or rupture of right shoulder, not specified as traumatic: Secondary | ICD-10-CM

## 2011-09-15 DIAGNOSIS — F329 Major depressive disorder, single episode, unspecified: Secondary | ICD-10-CM | POA: Diagnosis present

## 2011-09-15 DIAGNOSIS — M1990 Unspecified osteoarthritis, unspecified site: Secondary | ICD-10-CM

## 2011-09-15 DIAGNOSIS — F331 Major depressive disorder, recurrent, moderate: Secondary | ICD-10-CM

## 2011-09-15 DIAGNOSIS — E860 Dehydration: Secondary | ICD-10-CM | POA: Diagnosis not present

## 2011-09-15 DIAGNOSIS — L03119 Cellulitis of unspecified part of limb: Secondary | ICD-10-CM | POA: Diagnosis not present

## 2011-09-15 DIAGNOSIS — G319 Degenerative disease of nervous system, unspecified: Secondary | ICD-10-CM | POA: Diagnosis not present

## 2011-09-15 DIAGNOSIS — M19019 Primary osteoarthritis, unspecified shoulder: Secondary | ICD-10-CM | POA: Diagnosis not present

## 2011-09-15 DIAGNOSIS — F411 Generalized anxiety disorder: Secondary | ICD-10-CM | POA: Diagnosis not present

## 2011-09-15 DIAGNOSIS — M159 Polyosteoarthritis, unspecified: Secondary | ICD-10-CM | POA: Diagnosis not present

## 2011-09-15 DIAGNOSIS — Z043 Encounter for examination and observation following other accident: Secondary | ICD-10-CM | POA: Diagnosis not present

## 2011-09-15 DIAGNOSIS — M719 Bursopathy, unspecified: Secondary | ICD-10-CM | POA: Diagnosis present

## 2011-09-15 DIAGNOSIS — M67919 Unspecified disorder of synovium and tendon, unspecified shoulder: Secondary | ICD-10-CM | POA: Diagnosis present

## 2011-09-15 HISTORY — DX: Anxiety disorder, unspecified: F41.9

## 2011-09-15 LAB — DIFFERENTIAL
Basophils Relative: 0 % (ref 0–1)
Eosinophils Absolute: 0.1 10*3/uL (ref 0.0–0.7)
Eosinophils Relative: 1 % (ref 0–5)
Monocytes Absolute: 0.7 10*3/uL (ref 0.1–1.0)
Monocytes Relative: 7 % (ref 3–12)
Neutro Abs: 8.8 10*3/uL — ABNORMAL HIGH (ref 1.7–7.7)

## 2011-09-15 LAB — CBC
HCT: 39.3 % (ref 36.0–46.0)
Hemoglobin: 12.6 g/dL (ref 12.0–15.0)
MCH: 29.6 pg (ref 26.0–34.0)
MCHC: 32.1 g/dL (ref 30.0–36.0)
MCV: 92.5 fL (ref 78.0–100.0)

## 2011-09-15 LAB — BASIC METABOLIC PANEL
BUN: 18 mg/dL (ref 6–23)
CO2: 26 mEq/L (ref 19–32)
Chloride: 101 mEq/L (ref 96–112)
Creatinine, Ser: 1.17 mg/dL — ABNORMAL HIGH (ref 0.50–1.10)
Glucose, Bld: 113 mg/dL — ABNORMAL HIGH (ref 70–99)
Potassium: 3.8 mEq/L (ref 3.5–5.1)

## 2011-09-15 LAB — URINALYSIS, ROUTINE W REFLEX MICROSCOPIC
Bilirubin Urine: NEGATIVE
Glucose, UA: NEGATIVE mg/dL
Hgb urine dipstick: NEGATIVE
Protein, ur: NEGATIVE mg/dL

## 2011-09-15 LAB — PROCALCITONIN: Procalcitonin: 0.59 ng/mL

## 2011-09-15 MED ORDER — POTASSIUM CHLORIDE IN NACL 20-0.9 MEQ/L-% IV SOLN
INTRAVENOUS | Status: DC
  Administered 2011-09-16: via INTRAVENOUS

## 2011-09-15 MED ORDER — ACETAMINOPHEN 325 MG PO TABS: 650.0000 mg | ORAL_TABLET | Freq: Four times a day (QID) | ORAL | Status: DC | PRN

## 2011-09-15 MED ORDER — PAROXETINE HCL 20 MG PO TABS
40.0000 mg | ORAL_TABLET | Freq: Every day | ORAL | Status: DC
  Administered 2011-09-16: 40 mg via ORAL
  Filled 2011-09-15: qty 2

## 2011-09-15 MED ORDER — MELOXICAM 15 MG PO TABS
15.0000 mg | ORAL_TABLET | Freq: Every day | ORAL | Status: DC
Start: 2011-09-16 — End: 2011-09-16
  Filled 2011-09-15: qty 1

## 2011-09-15 MED ORDER — BUSPIRONE HCL 5 MG PO TABS
5.0000 mg | ORAL_TABLET | Freq: Three times a day (TID) | ORAL | Status: DC
  Administered 2011-09-16: 5 mg via ORAL
  Filled 2011-09-15 (×2): qty 1

## 2011-09-15 MED ORDER — ACETAMINOPHEN 650 MG RE SUPP: 650.0000 mg | Freq: Four times a day (QID) | RECTAL | Status: DC | PRN

## 2011-09-15 MED ORDER — TRAZODONE HCL 50 MG PO TABS: 25.0000 mg | ORAL_TABLET | Freq: Every evening | ORAL | Status: DC | PRN

## 2011-09-15 MED ORDER — ONDANSETRON HCL 4 MG PO TABS: 4.0000 mg | ORAL_TABLET | Freq: Four times a day (QID) | ORAL | Status: DC | PRN

## 2011-09-15 MED ORDER — TERAZOSIN HCL 1 MG PO CAPS: 2.0000 mg | ORAL_CAPSULE | Freq: Every day | ORAL | Status: DC

## 2011-09-15 MED ORDER — ASPIRIN EC 81 MG PO TBEC
81.0000 mg | DELAYED_RELEASE_TABLET | Freq: Every day | ORAL | Status: DC
  Administered 2011-09-16: 81 mg via ORAL
  Filled 2011-09-15: qty 1

## 2011-09-15 MED ORDER — PANTOPRAZOLE SODIUM 40 MG PO TBEC: 40.0000 mg | DELAYED_RELEASE_TABLET | Freq: Every day | ORAL | Status: DC

## 2011-09-15 MED ORDER — SODIUM CHLORIDE 0.9 % IV SOLN: INTRAVENOUS | Status: DC

## 2011-09-15 MED ORDER — SODIUM CHLORIDE 0.9 % IJ SOLN: 3.0000 mL | Freq: Two times a day (BID) | INTRAMUSCULAR | Status: DC

## 2011-09-15 MED ORDER — ONDANSETRON HCL 4 MG/2ML IJ SOLN: 4.0000 mg | Freq: Four times a day (QID) | INTRAMUSCULAR | Status: DC | PRN

## 2011-09-15 MED ORDER — ENOXAPARIN SODIUM 40 MG/0.4ML ~~LOC~~ SOLN
40.0000 mg | SUBCUTANEOUS | Status: DC
  Administered 2011-09-16: 40 mg via SUBCUTANEOUS
  Filled 2011-09-15 (×2): qty 0.4

## 2011-09-15 MED ORDER — BISACODYL 5 MG PO TBEC: 5.0000 mg | DELAYED_RELEASE_TABLET | Freq: Every day | ORAL | Status: DC | PRN

## 2011-09-15 MED ORDER — FLEET ENEMA 7-19 GM/118ML RE ENEM: 1.0000 | ENEMA | Freq: Once | RECTAL | Status: AC | PRN

## 2011-09-15 MED ORDER — DARIFENACIN HYDROBROMIDE ER 7.5 MG PO TB24
7.5000 mg | ORAL_TABLET | Freq: Every day | ORAL | Status: DC
  Administered 2011-09-16: 7.5 mg via ORAL
  Filled 2011-09-15: qty 1

## 2011-09-15 MED ORDER — LORAZEPAM 0.5 MG PO TABS: 0.5000 mg | ORAL_TABLET | Freq: Four times a day (QID) | ORAL | Status: DC | PRN

## 2011-09-15 NOTE — ED Provider Notes (Signed)
History   This chart was scribed for Amy Anger, DO by Amy Clay. The patient was seen in room [facility]8/[facility].    CSN: [id number]  Arrival date & time 09/15/11  1827   First MD Initiated Contact with Patient 09/15/11 1839      Chief Complaint  Patient presents with  . Loss of Consciousness    HPI Pt was seen at 1850.   Per pt, c/o sudden onset and resolution of one episode of syncope that occurred this afternoon PTA.  Pt states she was standing in her kitchen when she felt "lightheaded" then "just blacked out."  Patient notes being constipated yesterday, took a laxative and has had several BM's since. Denies chest pain/ palpitations, no SOB/cough, no abd pain, no N/V, no neck pain, no visual changes, no focal motor weakness, no tingling/numbness in extremities, no incont of bowel/bladder.     PCP Dr. Phillips Odor   Past Medical History  Diagnosis Date  . High blood pressure   . Arthritis   . Depression   . Anxiety     Past Surgical History  Procedure Date  . Back surgery   . Cholecystectomy   . Partial hysterectomy   . Bladder tacked   . Ankle surgery right ankle fracture  . Wrist surgery right wrist fracture    Family History  Problem Relation Age of Onset  . Arthritis    . Asthma    . Diabetes      History  Substance Use Topics  . Smoking status: Never Smoker   . Smokeless tobacco: Not on file  . Alcohol Use: No    Review of Systems ROS: Statement: All systems negative except as marked or noted in the HPI; Constitutional: Negative for fever and chills. ; ; Eyes: Negative for eye pain, redness and discharge. ; ; ENMT: Negative for ear pain, hoarseness, nasal congestion, sinus pressure and sore throat. ; ; Cardiovascular: Negative for chest pain, palpitations, diaphoresis, dyspnea and peripheral edema. ; ; Respiratory: Negative for cough, wheezing and stridor. ; ; Gastrointestinal: Negative for nausea, vomiting, diarrhea, abdominal pain, blood in stool,  hematemesis, jaundice and rectal bleeding. . ; ; Genitourinary: Negative for dysuria, flank pain and hematuria. ; ; Musculoskeletal: Negative for back pain and neck pain. Negative for swelling and trauma.; ; Skin: Negative for pruritus, rash, abrasions, blisters, bruising and skin lesion.; ; Neuro: Negative for headache, and neck stiffness. Negative for weakness, altered mental status, extremity weakness, paresthesias, involuntary movement, seizure and +lightheadedness, syncope.     Allergies  Cephalexin and Penicillins  Home Medications   Current Outpatient Rx  Name Route Sig Dispense Refill  . ASPIRIN 81 MG PO TABS Oral Take 81 mg by mouth daily.      . BUSPIRONE HCL 5 MG PO TABS Oral Take 1 tablet (5 mg total) by mouth 3 (three) times daily. 90 tablet 1  . CALCIUM CARBONATE 600 MG PO TABS Oral Take 600 mg by mouth 2 (two) times daily with a meal.      . ESTRACE 0.1 MG/GM VA CREA      . LORAZEPAM 0.5 MG PO TABS Oral Take 1 tablet (0.5 mg total) by mouth 4 (four) times daily as needed for anxiety. 120 tablet 1  . LUTEIN-ZEAXANTHIN PO Oral Take by mouth.      . MELOXICAM 15 MG PO TABS      . OMEPRAZOLE 20 MG PO CPDR      . PAROXETINE HCL 40 MG PO  TABS Oral Take 1 tablet (40 mg total) by mouth every morning. 30 tablet 1  . SOLIFENACIN SUCCINATE 10 MG PO TABS Oral Take 5 mg by mouth daily.      Marland Kitchen TERAZOSIN HCL 2 MG PO CAPS Oral Take 2 mg by mouth at bedtime.      Marland Kitchen VITAMIN D (ERGOCALCIFEROL) PO Oral Take 50,000 Units by mouth once a week.        BP 128/56  Pulse 65  Temp(Src) 97.8 F (36.6 C) (Oral)  Resp 20  Ht 5\' 6"  (1.676 m)  Wt 200 lb (90.719 kg)  BMI 32.28 kg/m2  SpO2 100%  Physical Exam 1855: Physical examination:  Nursing notes reviewed; Vital signs and O2 SAT reviewed;  Constitutional: Well developed, Well nourished, In no acute distress; Head:  Normocephalic, +small contusion left parietal scalp without open wound; Eyes: EOMI, PERRL, No scleral icterus; ENMT: Mouth and  pharynx normal, Mucous membranes dry; Neck: Supple, Full range of motion, No lymphadenopathy; Cardiovascular: Regular rate and rhythm, No murmur, rub, or gallop; Respiratory: Breath sounds clear & equal bilaterally, No rales, rhonchi, wheezes, or rub, Normal respiratory effort/excursion; Chest: Nontender, Movement normal; Abdomen: Soft, Nontender, Nondistended, Normal bowel sounds; Extremities: Pulses normal, No tenderness, No edema, No calf edema or asymmetry.; Neuro: AA&Ox3, Major CN grossly intact. No facial droop, speech clear. Equal grips bilat. No gross focal motor or sensory deficits in extremities.; Skin: Color normal, Warm, Dry.    ED Course  Procedures   MDM  MDM Reviewed: nursing note and vitals Reviewed previous: ECG Interpretation: ECG, labs, x-ray and CT scan    Date: 09/15/2011  Rate: 65  Rhythm: normal sinus rhythm  QRS Axis: normal  Intervals: normal  ST/T Wave abnormalities: normal  Conduction Disutrbances:none  Narrative Interpretation:   Old EKG Reviewed: unchanged; no significant changes from previous EKG dated 02/25/2007.   Results for orders placed during the hospital encounter of 09/15/11  BASIC METABOLIC PANEL      Component Value Range   Sodium 137  135 - 145 (mEq/L)   Potassium 3.8  3.5 - 5.1 (mEq/L)   Chloride 101  96 - 112 (mEq/L)   CO2 26  19 - 32 (mEq/L)   Glucose, Bld 113 (*) 70 - 99 (mg/dL)   BUN 18  6 - 23 (mg/dL)   Creatinine, Ser 1.61 (*) 0.50 - 1.10 (mg/dL)   Calcium 9.3  8.4 - 09.6 (mg/dL)   GFR calc non Af Amer 45 (*) >90 (mL/min)   GFR calc Af Amer 53 (*) >90 (mL/min)  CBC      Component Value Range   WBC 10.0  4.0 - 10.5 (K/uL)   RBC 4.25  3.87 - 5.11 (MIL/uL)   Hemoglobin 12.6  12.0 - 15.0 (g/dL)   HCT 04.5  40.9 - 81.1 (%)   MCV 92.5  78.0 - 100.0 (fL)   MCH 29.6  26.0 - 34.0 (pg)   MCHC 32.1  30.0 - 36.0 (g/dL)   RDW 91.4  78.2 - 95.6 (%)   Platelets 199  150 - 400 (K/uL)  DIFFERENTIAL      Component Value Range    Neutrophils Relative 88 (*) 43 - 77 (%)   Neutro Abs 8.8 (*) 1.7 - 7.7 (K/uL)   Lymphocytes Relative 5 (*) 12 - 46 (%)   Lymphs Abs 0.5 (*) 0.7 - 4.0 (K/uL)   Monocytes Relative 7  3 - 12 (%)   Monocytes Absolute 0.7  0.1 - 1.0 (K/uL)  Eosinophils Relative 1  0 - 5 (%)   Eosinophils Absolute 0.1  0.0 - 0.7 (K/uL)   Basophils Relative 0  0 - 1 (%)   Basophils Absolute 0.0  0.0 - 0.1 (K/uL)  TROPONIN I      Component Value Range   Troponin I <0.30  <0.30 (ng/mL)  URINALYSIS, ROUTINE W REFLEX MICROSCOPIC      Component Value Range   Color, Urine YELLOW  YELLOW    APPearance CLEAR  CLEAR    Specific Gravity, Urine <1.005 (*) 1.005 - 1.030    pH 6.0  5.0 - 8.0    Glucose, UA NEGATIVE  NEGATIVE (mg/dL)   Hgb urine dipstick NEGATIVE  NEGATIVE    Bilirubin Urine NEGATIVE  NEGATIVE    Ketones, ur NEGATIVE  NEGATIVE (mg/dL)   Protein, ur NEGATIVE  NEGATIVE (mg/dL)   Urobilinogen, UA 0.2  0.0 - 1.0 (mg/dL)   Nitrite NEGATIVE  NEGATIVE    Leukocytes, UA NEGATIVE  NEGATIVE   LACTIC ACID, PLASMA      Component Value Range   Lactic Acid, Venous 1.3  0.5 - 2.2 (mmol/L)  PROCALCITONIN      Component Value Range   Procalcitonin 0.59     Dg Chest 2 View 09/15/2011  *RADIOLOGY REPORT*  Clinical Data: Weakness and shortness of breath.  CHEST - 2 VIEW  Comparison: None  Findings: The cardiac silhouette, mediastinal and hilar contours are within normal limits.  The lungs are clear.  The bony thorax is intact.  IMPRESSION: No acute cardiopulmonary findings.  Original Report Authenticated By: P. Loralie Champagne, M.D.   Ct Head Wo Contrast 09/15/2011  *RADIOLOGY REPORT*  Clinical Data: Larey Seat.  CT HEAD WITHOUT CONTRAST  Technique:  Contiguous axial images were obtained from the base of the skull through the vertex without contrast.  Comparison: MRI 01/22/2007.  Findings: There is stable atrophy, ventriculomegaly and periventricular white matter disease.  No extra-axial fluid collections are identified.  No CT  findings for acute hemispheric infarction and/or intracranial hemorrhage.  No mass lesions. Brainstem and cerebellum appear normal.  The bony structures are intact.  No fracture or significant sinus disease.  The mastoid air cells and middle ear cavities are clear. The globes are intact.  Asymmetric right frontal inner table skull thickening could be due to a remote trauma or a meningioma.  IMPRESSION:  1.  Stable cerebral atrophy, ventriculomegaly and periventricular white matter disease. 2.  No acute intracranial findings or mass lesions. 3.  No skull fracture.  Original Report Authenticated By: P. Loralie Champagne, M.D.     9:52 PM:  Pt not orthostatic per VS, but holds on to ED RN very tightly when moving to standing position because she is "scared."  Denies specific vertigo or lightheadedness when standing.  Dx testing d/w pt and family.  Questions answered.  Verb understanding, agreeable to admit.  T/C to [facility] Dr. Orvan Falconer, case discussed, including:  HPI, pertinent PM/SHx, VS/PE, dx testing, ED course and treatment:  Agreeable to admit, requests to obtain tele bed.         I personally performed the services described in this documentation, which was scribed in my presence. The recorded information has been reviewed and considered. Shelise Maron Allison Quarry, DO 09/17/11 1543

## 2011-09-15 NOTE — H&P (Addendum)
PCP:   Colette Ribas, MD, MD   Psychiatrist: Kathryne Sharper M.D.  Chief Complaint:  Fainting spell this evening  HPI: Amy Clay is an 71 y.o. female.   Elderly Caucasian lady, lives with her sister, has a history of depression, no history of fainting, but was indicating cooking over the stove, when she suddenly felt as if she was going to faint and called out to her sister. Before sister could arrive, patient fell hit the back of her head the side of her head and her rump. She was unconscious for only a few seconds and was then brought to the emergency room for further evaluation.  Of note patient took 2 doses of Epsom salts last night, with coffee, leave her constipation. She woke up at 4 AM this morning and had multiple watery loose stools. But has had no bowel movements since.    X-rays of the chest and head are negative; her lab work is essentially normal, but she had some dizziness when sitting up. Also with sitting up her blood pressure seemed to rise nearly 30 points, and her pulse goes up 20 points. He continues to complain of pain at the base of her spine.  She takes Hytrin for blood pressure every morning and has done so for many years. She takes Ativan 4 times daily for anxiety. She recently was taken off Librium in favor of BuSpar for her depression and anxiety.   Rewiew of Systems:  The patient denies anorexia, fever, weight loss,, vision loss, decreased hearing, hoarseness, chest pain, dyspnea on exertion, peripheral edema, balance deficits, hemoptysis, abdominal pain, melena, hematochezia, severe indigestion/heartburn, hematuria, incontinence, genital sores, muscle weakness, suspicious skin lesions, transient blindness, difficulty walking, unusual weight change, abnormal bleeding, enlarged lymph nodes, angioedema, and breast masses.    Past Medical History  Diagnosis Date  . High blood pressure   . Arthritis   . Depression   . Anxiety     Past Surgical  History  Procedure Date  . Back surgery   . Cholecystectomy   . Partial hysterectomy   . Bladder tacked   . Ankle surgery right ankle fracture  . Wrist surgery right wrist fracture    Medications:  HOME MEDS: Prior to Admission medications   Medication Sig Start Date End Date Taking? Authorizing Provider  acetaminophen (TYLENOL) 650 MG CR tablet Take 650 mg by mouth every 8 (eight) hours as needed. pain   Yes Historical Provider, MD  aspirin EC 81 MG tablet Take 81 mg by mouth daily.   Yes Historical Provider, MD  busPIRone (BUSPAR) 5 MG tablet Take 1 tablet (5 mg total) by mouth 3 (three) times daily. 08/22/11 08/21/12 Yes Cleotis Nipper, MD  calcium carbonate (OS-CAL) 600 MG TABS Take 600 mg by mouth 2 (two) times daily with a meal.     Yes Historical Provider, MD  Cholecalciferol (VITAMIN D) 2000 UNITS CAPS Take 1 capsule by mouth daily.   Yes Historical Provider, MD  ESTRACE VAGINAL 0.1 MG/GM vaginal cream Place 2 g vaginally 2 (two) times a week.  10/15/10  Yes Historical Provider, MD  LORazepam (ATIVAN) 0.5 MG tablet Take 1 tablet (0.5 mg total) by mouth 4 (four) times daily as needed for anxiety. 08/22/11  Yes Cleotis Nipper, MD  LUTEIN-ZEAXANTHIN PO Take by mouth.     Yes Historical Provider, MD  meloxicam (MOBIC) 15 MG tablet Take 15 mg by mouth daily.  09/28/10  Yes Historical Provider, MD  omeprazole (PRILOSEC) 20  MG capsule Take 20 mg by mouth daily.  09/19/10  Yes Historical Provider, MD  ondansetron (ZOFRAN) 4 MG tablet Take 4 mg by mouth every 8 (eight) hours as needed. nausea   Yes Historical Provider, MD  PARoxetine (PAXIL) 40 MG tablet Take 1 tablet (40 mg total) by mouth every morning. 08/22/11  Yes Cleotis Nipper, MD  solifenacin (VESICARE) 10 MG tablet Take 10 mg by mouth daily.    Yes Historical Provider, MD  terazosin (HYTRIN) 2 MG capsule Take 2 mg by mouth at bedtime.     Yes Historical Provider, MD     Allergies:  Allergies  Allergen Reactions  . Cephalexin Rash and  Other (See Comments)    Tongue broke out really bad  . Penicillins Rash    Social History:   reports that she has never smoked. She does not have any smokeless tobacco history on file. She reports that she does not drink alcohol or use illicit drugs.  Family History: Family History  Problem Relation Age of Onset  . Arthritis    . Asthma    . Diabetes       Physical Exam: Filed Vitals:   09/16/11 0300 09/16/11 0400 09/16/11 0500 09/16/11 0600  BP: 142/71 131/69 137/69 137/60  Pulse: 65 65 66 65  Temp:  98.2 F (36.8 C)    TempSrc:  Oral    Resp: 16 14 16 17   Height:      Weight:   93.8 kg (206 lb 12.7 oz)   SpO2: 92% 94% 99% 97%   Blood pressure 137/60, pulse 65, temperature 98.2 F (36.8 C), temperature source Oral, resp. rate 17, height 5\' 6"  (1.676 m), weight 93.8 kg (206 lb 12.7 oz), SpO2 97.00%.  GEN:  Pleasant obese elderly Caucasian lady lying in the stretcher in no acute distress; cooperative with exam PSYCH:  alert and oriented x4; does  appear somewhat anxious. HEENT: Mucous membranes pink, dry, and anicteric; PERRLA; EOM intact; no cervical lymphadenopathy nor thyromegaly or carotid bruit; no JVD; Breasts:: Not examined CHEST WALL: No tenderness CHEST: Normal respiration, clear to auscultation bilaterally HEART: Regular rate and rhythm; no murmurs rubs or gallops BACK: No bruising; no CVA tenderness ABDOMEN: Obese, soft non-tender; no masses, no organomegaly, normal abdominal bowel sounds;  Rectal Exam: Not done EXTREMITIES: age-appropriate arthropathy of the hands and knees; no edema; no ulcerations. Genitalia: not examined PULSES: 2+ and symmetric SKIN: Normal hydration no rash or ulceration CNS: Cranial nerves 2-12 grossly intact no focal lateralizing neurologic deficit   Labs & Imaging Results for orders placed during the hospital encounter of 09/15/11 (from the past 48 hour(s))  BASIC METABOLIC PANEL     Status: Abnormal   Collection Time   09/15/11   7:09 PM      Component Value Range Comment   Sodium 137  135 - 145 (mEq/L)    Potassium 3.8  3.5 - 5.1 (mEq/L)    Chloride 101  96 - 112 (mEq/L)    CO2 26  19 - 32 (mEq/L)    Glucose, Bld 113 (*) 70 - 99 (mg/dL)    BUN 18  6 - 23 (mg/dL)    Creatinine, Ser 1.61 (*) 0.50 - 1.10 (mg/dL)    Calcium 9.3  8.4 - 10.5 (mg/dL)    GFR calc non Af Amer 45 (*) >90 (mL/min)    GFR calc Af Amer 53 (*) >90 (mL/min)   CBC     Status: Normal   Collection  Time   09/15/11  7:09 PM      Component Value Range Comment   WBC 10.0  4.0 - 10.5 (K/uL)    RBC 4.25  3.87 - 5.11 (MIL/uL)    Hemoglobin 12.6  12.0 - 15.0 (g/dL)    HCT 78.4  69.6 - 29.5 (%)    MCV 92.5  78.0 - 100.0 (fL)    MCH 29.6  26.0 - 34.0 (pg)    MCHC 32.1  30.0 - 36.0 (g/dL)    RDW 28.4  13.2 - 44.0 (%)    Platelets 199  150 - 400 (K/uL)   DIFFERENTIAL     Status: Abnormal   Collection Time   09/15/11  7:09 PM      Component Value Range Comment   Neutrophils Relative 88 (*) 43 - 77 (%)    Neutro Abs 8.8 (*) 1.7 - 7.7 (K/uL)    Lymphocytes Relative 5 (*) 12 - 46 (%)    Lymphs Abs 0.5 (*) 0.7 - 4.0 (K/uL)    Monocytes Relative 7  3 - 12 (%)    Monocytes Absolute 0.7  0.1 - 1.0 (K/uL)    Eosinophils Relative 1  0 - 5 (%)    Eosinophils Absolute 0.1  0.0 - 0.7 (K/uL)    Basophils Relative 0  0 - 1 (%)    Basophils Absolute 0.0  0.0 - 0.1 (K/uL)   TROPONIN I     Status: Normal   Collection Time   09/15/11  7:09 PM      Component Value Range Comment   Troponin I <0.30  <0.30 (ng/mL)   LACTIC ACID, PLASMA     Status: Normal   Collection Time   09/15/11  7:09 PM      Component Value Range Comment   Lactic Acid, Venous 1.3  0.5 - 2.2 (mmol/L)   PROCALCITONIN     Status: Normal   Collection Time   09/15/11  7:09 PM      Component Value Range Comment   Procalcitonin 0.59     URINALYSIS, ROUTINE W REFLEX MICROSCOPIC     Status: Abnormal   Collection Time   09/15/11  8:31 PM      Component Value Range Comment   Color, Urine YELLOW  YELLOW      APPearance CLEAR  CLEAR     Specific Gravity, Urine <1.005 (*) 1.005 - 1.030     pH 6.0  5.0 - 8.0     Glucose, UA NEGATIVE  NEGATIVE (mg/dL)    Hgb urine dipstick NEGATIVE  NEGATIVE     Bilirubin Urine NEGATIVE  NEGATIVE     Ketones, ur NEGATIVE  NEGATIVE (mg/dL)    Protein, ur NEGATIVE  NEGATIVE (mg/dL)    Urobilinogen, UA 0.2  0.0 - 1.0 (mg/dL)    Nitrite NEGATIVE  NEGATIVE     Leukocytes, UA NEGATIVE  NEGATIVE  MICROSCOPIC NOT DONE ON URINES WITH NEGATIVE PROTEIN, BLOOD, LEUKOCYTES, NITRITE, OR GLUCOSE <1000 mg/dL.  MRSA PCR SCREENING     Status: Normal   Collection Time   09/15/11 11:32 PM      Component Value Range Comment   MRSA by PCR NEGATIVE  NEGATIVE    CARDIAC PANEL(CRET KIN+CKTOT+MB+TROPI)     Status: Normal   Collection Time   09/16/11 12:57 AM      Component Value Range Comment   Total CK 55  7 - 177 (U/L)    CK, MB 2.9  0.3 - 4.0 (  ng/mL)    Troponin I <0.30  <0.30 (ng/mL)    Relative Index RELATIVE INDEX IS INVALID  0.0 - 2.5                                                                                                                                                 Dg Chest 2 View  09/15/2011  *RADIOLOGY REPORT*  Clinical Data: Weakness and shortness of breath.  CHEST - 2 VIEW  Comparison: None  Findings: The cardiac silhouette, mediastinal and hilar contours are within normal limits.  The lungs are clear.  The bony thorax is intact.  IMPRESSION: No acute cardiopulmonary findings.  Original Report Authenticated By: P. Loralie Champagne, M.D.   Ct Head Wo Contrast  09/15/2011  *RADIOLOGY REPORT*  Clinical Data: Larey Seat.  CT HEAD WITHOUT CONTRAST  Technique:  Contiguous axial images were obtained from the base of the skull through the vertex without contrast.  Comparison: MRI 01/22/2007.  Findings: There is stable atrophy, ventriculomegaly and periventricular white matter disease.  No extra-axial fluid collections are identified.  No CT findings for  acute hemispheric infarction and/or intracranial hemorrhage.  No mass lesions. Brainstem and cerebellum appear normal.  The bony structures are intact.  No fracture or significant sinus disease.  The mastoid air cells and middle ear cavities are clear. The globes are intact.  Asymmetric right frontal inner table skull thickening could be due to a remote trauma or a meningioma.  IMPRESSION:  1.  Stable cerebral atrophy, ventriculomegaly and periventricular white matter disease. 2.  No acute intracranial findings or mass lesions. 3.  No skull fracture.  Original Report Authenticated By: P. Loralie Champagne, M.D.      Assessment Present on Admission:  .Syncope and collapse .Rotator cuff syndrome of right shoulder .Major depression .Arthritis   PLAN: Syncope probably related to dehydration, related to laxative use,  and orthostasis, complicated by her psychotropic meds and Hytrin, and heat of cooking over the stove.  Bring this lady on observation hydration and a cardiac workup. Since patient is able to walk without difficult, will not get a CT of her pelvis, since it will not change the fact that she needs to have some physical therapy   Other plans as per orders.   Jillienne Egner 09/16/2011, 7:15 AM

## 2011-09-15 NOTE — ED Notes (Signed)
Pt complaining of tailbone hurting.

## 2011-09-15 NOTE — ED Notes (Signed)
EMS reports was called out for syncope.  Reports pt passed out while walking in her house.  C/O sacral pain but says is chronic.  Reports pt c/o feeling dizzy and "blacked out."  EMS says pt was constipated yesterday and took a lot of laxatives.  Reports has been having frequent bm's.  EMS administered 350cc NSS and bp wnl.  Reports afib on monitor, but is not new.    CBG 134

## 2011-09-16 DIAGNOSIS — R55 Syncope and collapse: Secondary | ICD-10-CM

## 2011-09-16 DIAGNOSIS — M159 Polyosteoarthritis, unspecified: Secondary | ICD-10-CM

## 2011-09-16 DIAGNOSIS — F329 Major depressive disorder, single episode, unspecified: Secondary | ICD-10-CM

## 2011-09-16 DIAGNOSIS — I10 Essential (primary) hypertension: Secondary | ICD-10-CM

## 2011-09-16 LAB — CBC
Hemoglobin: 11.5 g/dL — ABNORMAL LOW (ref 12.0–15.0)
MCH: 29.2 pg (ref 26.0–34.0)
MCHC: 31.3 g/dL (ref 30.0–36.0)
RDW: 13.1 % (ref 11.5–15.5)

## 2011-09-16 LAB — CARDIAC PANEL(CRET KIN+CKTOT+MB+TROPI)
CK, MB: 2.9 ng/mL (ref 0.3–4.0)
CK, MB: 2.7 ng/mL (ref 0.3–4.0)
Relative Index: INVALID (ref 0.0–2.5)
Total CK: 55 U/L (ref 7–177)
Troponin I: <0.3 ng/mL (ref <0.30)
Troponin I: <0.3 ng/mL (ref <0.30)

## 2011-09-16 LAB — BASIC METABOLIC PANEL
BUN: 15 mg/dL (ref 6–23)
Calcium: 9.2 mg/dL (ref 8.4–10.5)
Creatinine, Ser: 1.09 mg/dL (ref 0.50–1.10)
GFR calc non Af Amer: 49 mL/min — ABNORMAL LOW (ref >90)
Glucose, Bld: 92 mg/dL (ref 70–99)
Potassium: 4 mEq/L (ref 3.5–5.1)

## 2011-09-16 LAB — MRSA PCR SCREENING: MRSA by PCR: NEGATIVE

## 2011-09-16 NOTE — Progress Notes (Signed)
Pt discharged to home with sister. States they take care of each other. Discharge instructions given regarding diet, activity, medications, and care of iv site. Pt instructed to watch for signs of infection and immediately report symptoms to PCP or go to ED. Pt will follow up with PCP.

## 2011-09-16 NOTE — Discharge Summary (Signed)
Physician Discharge Summary  Patient ID: Amy Clay MRN: [id number] DOB/AGE: 11-16-1940 71 y.o. Primary Care Physician:[name],[name], MD, MD Admit date: 09/15/2011 Discharge date: 09/16/2011    Discharge Diagnoses:  1. Syncopal episode secondary to hypovolemia/dehydration. Recent laxative use. 2. Major depression. 3. Rotator cuff syndrome of the right shoulder.   Medication List  As of 09/16/2011  8:07 AM   TAKE these medications         acetaminophen 650 MG CR tablet   Commonly known as: TYLENOL   Take 650 mg by mouth every 8 (eight) hours as needed. pain      aspirin EC 81 MG tablet   Take 81 mg by mouth daily.      busPIRone 5 MG tablet   Commonly known as: BUSPAR   Take 1 tablet (5 mg total) by mouth 3 (three) times daily.      calcium carbonate 600 MG Tabs   Commonly known as: OS-CAL   Take 600 mg by mouth 2 (two) times daily with a meal.      ESTRACE VAGINAL 0.1 MG/GM vaginal cream   Generic drug: estradiol   Place 2 g vaginally 2 (two) times a week.      LORazepam 0.5 MG tablet   Commonly known as: ATIVAN   Take 1 tablet (0.5 mg total) by mouth 4 (four) times daily as needed for anxiety.      LUTEIN-ZEAXANTHIN PO   Take by mouth.      meloxicam 15 MG tablet   Commonly known as: MOBIC   Take 15 mg by mouth daily.      omeprazole 20 MG capsule   Commonly known as: PRILOSEC   Take 20 mg by mouth daily.      ondansetron 4 MG tablet   Commonly known as: ZOFRAN   Take 4 mg by mouth every 8 (eight) hours as needed. nausea      PARoxetine 40 MG tablet   Commonly known as: PAXIL   Take 1 tablet (40 mg total) by mouth every morning.      solifenacin 10 MG tablet   Commonly known as: VESICARE   Take 10 mg by mouth daily.      terazosin 2 MG capsule   Commonly known as: HYTRIN   Take 2 mg by mouth at bedtime.      Vitamin D 2000 UNITS Caps   Take 1 capsule by mouth daily.            Discharged Condition: Stable.    Consults:  None.  Significant Diagnostic Studies: Dg Chest 2 View  09/15/2011  *RADIOLOGY REPORT*  Clinical Data: Weakness and shortness of breath.  CHEST - 2 VIEW  Comparison: None  Findings: The cardiac silhouette, mediastinal and hilar contours are within normal limits.  The lungs are clear.  The bony thorax is intact.  IMPRESSION: No acute cardiopulmonary findings.  Original Report Authenticated By: P. Loralie Champagne, M.D.   Ct Head Wo Contrast  09/15/2011  *RADIOLOGY REPORT*  Clinical Data: Larey Seat.  CT HEAD WITHOUT CONTRAST  Technique:  Contiguous axial images were obtained from the base of the skull through the vertex without contrast.  Comparison: MRI 01/22/2007.  Findings: There is stable atrophy, ventriculomegaly and periventricular white matter disease.  No extra-axial fluid collections are identified.  No CT findings for acute hemispheric infarction and/or intracranial hemorrhage.  No mass lesions. Brainstem and cerebellum appear normal.  The bony structures are intact.  No fracture or significant sinus  disease.  The mastoid air cells and middle ear cavities are clear. The globes are intact.  Asymmetric right frontal inner table skull thickening could be due to a remote trauma or a meningioma.  IMPRESSION:  1.  Stable cerebral atrophy, ventriculomegaly and periventricular white matter disease. 2.  No acute intracranial findings or mass lesions. 3.  No skull fracture.  Original Report Authenticated By: P. Loralie Champagne, M.D.    Lab Results: Basic Metabolic Panel:  Basename 09/16/11 0417 09/15/11 1909  NA 141 137  K 4.0 3.8  CL 107 101  CO2 27 26  GLUCOSE 92 113*  BUN 15 18  CREATININE 1.09 1.17*  CALCIUM 9.2 9.3  MG -- --  PHOS -- --       CBC:  Basename 09/16/11 0417 09/15/11 1909  WBC 6.9 10.0  NEUTROABS -- 8.8*  HGB 11.5* 12.6  HCT 36.8 39.3  MCV 93.4 92.5  PLT 186 199    Recent Results (from the past 240 hour(s))  MRSA PCR SCREENING     Status: Normal   Collection Time    09/15/11 11:32 PM      Component Value Range Status Comment   MRSA by PCR NEGATIVE  NEGATIVE  Final      Hospital Course: This 71 year old lady was admitted yesterday for observation. She suddenly felt lightheaded and fell she was going to faint. Before her sister could arrive, she fell and hit the back of her head. She was unconscious for only a few seconds. She had taken 2 doses of Epsom salts the previous night to try to relieve her constipation. Approximately 4:00 in the morning on the day of admission she had multiple watery stools. She was admitted and appropriately treated with intravenous fluids. Serial cardiac enzymes have been negative. CT scan of her brain is negative for any intracranial bleeding. She does have some pain in the lower back in the coccygeal region but she is able to walk. Therefore, a pelvic CT was not indicated.  Discharge Exam: Blood pressure 137/60, pulse 65, temperature 98.2 F (36.8 C), temperature source Oral, resp. rate 17, height 5\' 6"  (1.676 m), weight 93.8 kg (206 lb 12.7 oz), SpO2 97.00%. She looks systemically well. She is alert and orientated. There are no focal neurologic signs. Heart sounds are present and normal. Lung fields are clear. Abdomen is soft and nontender.  Disposition: Home. I will order home health physical therapy for this lady.  Discharge Orders    Future Appointments: Provider: Department: Dept Phone: Center:   09/17/2011 11:00 AM [facility] [phone] [place] [facility]   10/01/2011 2:30 PM Corbin Ade, MD [facility] [phone] [facility]     Future Orders Please Complete By Expires   Diet - low sodium heart healthy      Increase activity slowly           Signed: Wilson Singer Pager [phone]  09/16/2011, 8:07 AM

## 2011-09-16 NOTE — Care Management Note (Signed)
    Page 1 of 1   09/16/2011     8:44:44 AM   CARE MANAGEMENT NOTE 09/16/2011  Patient:  Amy Clay, Amy Clay   Account Number:  [id number]  Date Initiated:  09/16/2011  Documentation initiated by:  Rosemary Holms  Subjective/Objective Assessment:   Pt admitted after falling at home. Pt lived at home alone     Action/Plan:   Pt discharging today and states she has used [facility] in the past. [facility] up with [facility] for [facility]   Anticipated DC Date:  09/16/2011   Anticipated DC Plan:  HOME W HOME HEALTH SERVICES      DC Planning Services  CM consult      Choice offered to / List presented to:          [facility] arranged  [facility]-2 PT      [facility] agency  [facility]   Status of service:  Completed, signed off Medicare Important Message given?   (If response is "NO", the following Medicare IM given date fields will be blank) Date Medicare IM given:   Date Additional Medicare IM given:    Discharge Disposition:  HOME W HOME HEALTH SERVICES  Per UR Regulation:    If discussed at Long Length of Stay Meetings, dates discussed:    Comments:  09/16/11 830 Khyli Swaim RN BSN CM

## 2011-09-17 ENCOUNTER — Other Ambulatory Visit (HOSPITAL_COMMUNITY): Payer: Self-pay

## 2011-09-17 LAB — URINE CULTURE
Colony Count: >=100000
Culture  Setup Time: 201305052205

## 2011-09-18 DIAGNOSIS — R262 Difficulty in walking, not elsewhere classified: Secondary | ICD-10-CM | POA: Diagnosis not present

## 2011-09-18 DIAGNOSIS — M159 Polyosteoarthritis, unspecified: Secondary | ICD-10-CM | POA: Diagnosis not present

## 2011-09-18 DIAGNOSIS — F329 Major depressive disorder, single episode, unspecified: Secondary | ICD-10-CM | POA: Diagnosis not present

## 2011-09-18 DIAGNOSIS — R55 Syncope and collapse: Secondary | ICD-10-CM | POA: Diagnosis not present

## 2011-09-18 DIAGNOSIS — IMO0001 Reserved for inherently not codable concepts without codable children: Secondary | ICD-10-CM | POA: Diagnosis not present

## 2011-09-18 DIAGNOSIS — I10 Essential (primary) hypertension: Secondary | ICD-10-CM | POA: Diagnosis not present

## 2011-09-19 DIAGNOSIS — I10 Essential (primary) hypertension: Secondary | ICD-10-CM | POA: Diagnosis not present

## 2011-09-19 DIAGNOSIS — IMO0001 Reserved for inherently not codable concepts without codable children: Secondary | ICD-10-CM | POA: Diagnosis not present

## 2011-09-19 DIAGNOSIS — R262 Difficulty in walking, not elsewhere classified: Secondary | ICD-10-CM | POA: Diagnosis not present

## 2011-09-19 DIAGNOSIS — R55 Syncope and collapse: Secondary | ICD-10-CM | POA: Diagnosis not present

## 2011-09-19 DIAGNOSIS — F329 Major depressive disorder, single episode, unspecified: Secondary | ICD-10-CM | POA: Diagnosis not present

## 2011-09-19 DIAGNOSIS — M159 Polyosteoarthritis, unspecified: Secondary | ICD-10-CM | POA: Diagnosis not present

## 2011-09-23 DIAGNOSIS — R262 Difficulty in walking, not elsewhere classified: Secondary | ICD-10-CM | POA: Diagnosis not present

## 2011-09-23 DIAGNOSIS — M159 Polyosteoarthritis, unspecified: Secondary | ICD-10-CM | POA: Diagnosis not present

## 2011-09-23 DIAGNOSIS — I10 Essential (primary) hypertension: Secondary | ICD-10-CM | POA: Diagnosis not present

## 2011-09-23 DIAGNOSIS — F329 Major depressive disorder, single episode, unspecified: Secondary | ICD-10-CM | POA: Diagnosis not present

## 2011-09-23 DIAGNOSIS — IMO0001 Reserved for inherently not codable concepts without codable children: Secondary | ICD-10-CM | POA: Diagnosis not present

## 2011-09-23 DIAGNOSIS — R55 Syncope and collapse: Secondary | ICD-10-CM | POA: Diagnosis not present

## 2011-09-25 DIAGNOSIS — F329 Major depressive disorder, single episode, unspecified: Secondary | ICD-10-CM | POA: Diagnosis not present

## 2011-09-25 DIAGNOSIS — R55 Syncope and collapse: Secondary | ICD-10-CM | POA: Diagnosis not present

## 2011-09-25 DIAGNOSIS — I10 Essential (primary) hypertension: Secondary | ICD-10-CM | POA: Diagnosis not present

## 2011-09-25 DIAGNOSIS — R262 Difficulty in walking, not elsewhere classified: Secondary | ICD-10-CM | POA: Diagnosis not present

## 2011-09-25 DIAGNOSIS — IMO0001 Reserved for inherently not codable concepts without codable children: Secondary | ICD-10-CM | POA: Diagnosis not present

## 2011-09-25 DIAGNOSIS — M159 Polyosteoarthritis, unspecified: Secondary | ICD-10-CM | POA: Diagnosis not present

## 2011-09-25 NOTE — Progress Notes (Signed)
UR Chart Review Completed  

## 2011-09-27 DIAGNOSIS — I10 Essential (primary) hypertension: Secondary | ICD-10-CM | POA: Diagnosis not present

## 2011-09-27 DIAGNOSIS — F329 Major depressive disorder, single episode, unspecified: Secondary | ICD-10-CM | POA: Diagnosis not present

## 2011-09-27 DIAGNOSIS — M159 Polyosteoarthritis, unspecified: Secondary | ICD-10-CM | POA: Diagnosis not present

## 2011-09-27 DIAGNOSIS — IMO0001 Reserved for inherently not codable concepts without codable children: Secondary | ICD-10-CM | POA: Diagnosis not present

## 2011-09-27 DIAGNOSIS — R55 Syncope and collapse: Secondary | ICD-10-CM | POA: Diagnosis not present

## 2011-09-27 DIAGNOSIS — R262 Difficulty in walking, not elsewhere classified: Secondary | ICD-10-CM | POA: Diagnosis not present

## 2011-09-30 DIAGNOSIS — B351 Tinea unguium: Secondary | ICD-10-CM | POA: Diagnosis not present

## 2011-09-30 DIAGNOSIS — IMO0001 Reserved for inherently not codable concepts without codable children: Secondary | ICD-10-CM | POA: Diagnosis not present

## 2011-09-30 DIAGNOSIS — L84 Corns and callosities: Secondary | ICD-10-CM | POA: Diagnosis not present

## 2011-09-30 DIAGNOSIS — R55 Syncope and collapse: Secondary | ICD-10-CM | POA: Diagnosis not present

## 2011-09-30 DIAGNOSIS — F329 Major depressive disorder, single episode, unspecified: Secondary | ICD-10-CM | POA: Diagnosis not present

## 2011-09-30 DIAGNOSIS — I10 Essential (primary) hypertension: Secondary | ICD-10-CM | POA: Diagnosis not present

## 2011-09-30 DIAGNOSIS — M79609 Pain in unspecified limb: Secondary | ICD-10-CM | POA: Diagnosis not present

## 2011-09-30 DIAGNOSIS — R262 Difficulty in walking, not elsewhere classified: Secondary | ICD-10-CM | POA: Diagnosis not present

## 2011-09-30 DIAGNOSIS — M159 Polyosteoarthritis, unspecified: Secondary | ICD-10-CM | POA: Diagnosis not present

## 2011-10-01 ENCOUNTER — Ambulatory Visit (INDEPENDENT_AMBULATORY_CARE_PROVIDER_SITE_OTHER): Payer: Medicare Other | Admitting: Internal Medicine

## 2011-10-01 ENCOUNTER — Encounter: Payer: Self-pay | Admitting: Internal Medicine

## 2011-10-01 DIAGNOSIS — K5900 Constipation, unspecified: Secondary | ICD-10-CM | POA: Diagnosis not present

## 2011-10-01 DIAGNOSIS — R1310 Dysphagia, unspecified: Secondary | ICD-10-CM | POA: Diagnosis not present

## 2011-10-01 DIAGNOSIS — R109 Unspecified abdominal pain: Secondary | ICD-10-CM | POA: Insufficient documentation

## 2011-10-01 DIAGNOSIS — K429 Umbilical hernia without obstruction or gangrene: Secondary | ICD-10-CM

## 2011-10-01 MED ORDER — PEG-KCL-NACL-NASULF-NA ASC-C 100 G PO SOLR: 1.0000 | Freq: Once | ORAL | Status: DC

## 2011-10-01 NOTE — Progress Notes (Deleted)
Primary Care Physician:  Colette Ribas, MD, MD Primary Gastroenterologist:  Dr.   Pre-Procedure History & Physical: HPI:  Amy Clay is a 71 y.o. female here for   Past Medical History  Diagnosis Date  . High blood pressure   . Arthritis   . Depression   . Anxiety   . Hemorrhoids   . Hyperlipidemia     Past Surgical History  Procedure Date  . Back surgery 03/16/87  . Cholecystectomy 08/15/93  . Partial hysterectomy 04/04/95  . Bladder tacked 04/04/95  . Ankle surgery 04/28/97    right ankle fracture  . Wrist surgery 02/24/09    right wrist fracture  . Colonoscopy 10/26/2002    Dr. Karilyn Cota- small external hemorrhoids otherwise normal   . Breast biopsy 05/16/95    right side    Prior to Admission medications   Medication Sig Start Date End Date Taking? Authorizing Provider  acetaminophen (TYLENOL) 650 MG CR tablet Take 650 mg by mouth every 8 (eight) hours as needed. pain   Yes Historical Provider, MD  aspirin EC 81 MG tablet Take 81 mg by mouth daily.   Yes Historical Provider, MD  busPIRone (BUSPAR) 5 MG tablet Take 1 tablet (5 mg total) by mouth 3 (three) times daily. 08/22/11 08/21/12 Yes Cleotis Nipper, MD  calcium carbonate (OS-CAL) 600 MG TABS Take 600 mg by mouth 2 (two) times daily with a meal.     Yes Historical Provider, MD  Cholecalciferol (VITAMIN D) 2000 UNITS CAPS Take 1 capsule by mouth daily.   Yes Historical Provider, MD  LORazepam (ATIVAN) 0.5 MG tablet Take 1 tablet (0.5 mg total) by mouth 4 (four) times daily as needed for anxiety. 08/22/11  Yes Cleotis Nipper, MD  LUTEIN-ZEAXANTHIN PO Take by mouth.     Yes Historical Provider, MD  meloxicam (MOBIC) 15 MG tablet Take 15 mg by mouth daily.  09/28/10  Yes Historical Provider, MD  omeprazole (PRILOSEC) 20 MG capsule Take 20 mg by mouth daily.  09/19/10  Yes Historical Provider, MD  ondansetron (ZOFRAN) 4 MG tablet Take 4 mg by mouth every 8 (eight) hours as needed. nausea   Yes Historical Provider, MD    PARoxetine (PAXIL) 40 MG tablet Take 1 tablet (40 mg total) by mouth every morning. 08/22/11  Yes Cleotis Nipper, MD  solifenacin (VESICARE) 10 MG tablet Take 10 mg by mouth daily.    Yes Historical Provider, MD  terazosin (HYTRIN) 2 MG capsule Take 2 mg by mouth at bedtime.     Yes Historical Provider, MD  vitamin C (ASCORBIC ACID) 250 MG tablet Take 250 mg by mouth daily.   Yes Historical Provider, MD  ESTRACE VAGINAL 0.1 MG/GM vaginal cream Place 2 g vaginally 2 (two) times a week.  10/15/10   Historical Provider, MD    Allergies as of 10/01/2011 - Review Complete 10/01/2011  Allergen Reaction Noted  . Cephalexin Rash and Other (See Comments)   . Penicillins Rash     Family History  Problem Relation Age of Onset  . Arthritis    . Asthma    . Diabetes      History   Social History  . Marital Status: Single    Spouse Name: N/A    Number of Children: N/A  . Years of Education: 12   Occupational History  . retired    Social History Main Topics  . Smoking status: Never Smoker   . Smokeless tobacco: Not on file  .  Alcohol Use: No  . Drug Use: No  . Sexually Active: Not on file   Other Topics Concern  . Not on file   Social History Narrative  . No narrative on file    Review of Systems: See HPI, otherwise negative ROS  Physical Exam: BP 145/82  Pulse 96  Temp(Src) 98.1 F (36.7 C) (Temporal)  Ht 5\' 6"  (1.676 m)  Wt 201 lb 3.2 oz (91.264 kg)  BMI 32.47 kg/m2 General:   Alert,  Well-developed, well-nourished, pleasant and cooperative in NAD Skin:  Intact without significant lesions or rashes. Eyes:  Sclera clear, no icterus.   Conjunctiva pink. Ears:  Normal auditory acuity. Nose:  No deformity, discharge,  or lesions. Mouth:  No deformity or lesions. Neck:  Supple; no masses or thyromegaly. No significant cervical adenopathy. Lungs:  Clear throughout to auscultation.   No wheezes, crackles, or rhonchi. No acute distress. Heart:  Regular rate and rhythm; no  murmurs, clicks, rubs,  or gallops. Abdomen: Non-distended, normal bowel sounds.  Soft and nontender without appreciable mass or hepatosplenomegaly.  Pulses:  Normal pulses noted. Extremities:  Without clubbing or edema.  Impression/Plan:  ***

## 2011-10-01 NOTE — Patient Instructions (Signed)
EGD w possible esophageal dilation and diagnostic colonoscopy in the near future

## 2011-10-01 NOTE — Assessment & Plan Note (Signed)
This pleasant 71 year old lady actually has a multitude of GI complaints including intermittent esophageal dysphagia diffuse intermittent abdominal pain (often temporally related to a bulging umbilical hernia) and chronic constipation. She has lost 12 pounds in the past month or so.   Recommendations: I have offered this nice lady both a EGD with possible suction dilation as appropriate and a diagnostic colonoscopy in the near future.The risks, benefits, limitations, imponderables and alternatives regarding both EGD and colonoscopy have been reviewed with the patient. Questions have been answered. All parties agreeable.  Further recommendations to follow. I'd like to thank Dr. Phillips Odor forallowing me  see this nice lady today.

## 2011-10-01 NOTE — Progress Notes (Signed)
Referring Provider: [name], [name], MD Primary Care Physician:  [name],[name], MD, MD Primary Gastroenterologist:  Dr.  Phillp Dolores  Chief Complaint  Patient presents with  . Abdominal Pain  . Nausea    HPI:  Amy Clay is a 70 y.o. female here as a referral from Dr. [name] for further evaluation intermittent abdominal pain and nausea. Symptoms insidiously progressed over the past couple months; she states she's lost 12 pounds the past one month.  GERD symptoms well controlled on Prilosec. Reports intermittent soft with dysphagia to solids. Notes a "bulging belly button" from time to time since cholecystectomy 1995. Bulge associated with some degree of her abdominal pain which she's been experiencing. Worsening of constipation recently no rectal bleeding. Takes Epson salts and MiraLax with variable results. States lifelong constipation. However, I note that she underwent colonoscopy by Dr. Rehman about 10 years ago for diarrhea. She had external hemorrhoids otherwise normal examination. Don't have an element of interval bowel syndrome at that time. No family history GI neoplasia. Of note, she does take meloxicam for osteoarthritis.  Past Medical History  Diagnosis Date  . High blood pressure   . Arthritis   . Depression   . Anxiety   . Hemorrhoids   . Hyperlipidemia     Past Surgical History  Procedure Date  . Back surgery 03/16/87  . Cholecystectomy 08/15/93  . Partial hysterectomy 04/04/95  . Bladder tacked 04/04/95  . Ankle surgery 04/28/97    right ankle fracture  . Wrist surgery 02/24/09    right wrist fracture  . Colonoscopy 10/26/2002    Dr. Rehman- small external hemorrhoids otherwise normal   . Breast biopsy 05/16/95    right side    Current Outpatient Prescriptions  Medication Sig Dispense Refill  . acetaminophen (TYLENOL) 650 MG CR tablet Take 650 mg by mouth every 8 (eight) hours as needed. pain      . aspirin EC 81 MG tablet Take 81 mg by mouth daily.        . busPIRone (BUSPAR) 5 MG tablet Take 1 tablet (5 mg total) by mouth 3 (three) times daily.  90 tablet  1  . calcium carbonate (OS-CAL) 600 MG TABS Take 600 mg by mouth 2 (two) times daily with a meal.        . Cholecalciferol (VITAMIN D) 2000 UNITS CAPS Take 1 capsule by mouth daily.      . LORazepam (ATIVAN) 0.5 MG tablet Take 1 tablet (0.5 mg total) by mouth 4 (four) times daily as needed for anxiety.  120 tablet  1  . LUTEIN-ZEAXANTHIN PO Take by mouth.        . meloxicam (MOBIC) 15 MG tablet Take 15 mg by mouth daily.       . omeprazole (PRILOSEC) 20 MG capsule Take 20 mg by mouth daily.       . ondansetron (ZOFRAN) 4 MG tablet Take 4 mg by mouth every 8 (eight) hours as needed. nausea      . PARoxetine (PAXIL) 40 MG tablet Take 1 tablet (40 mg total) by mouth every morning.  30 tablet  1  . solifenacin (VESICARE) 10 MG tablet Take 10 mg by mouth daily.       . terazosin (HYTRIN) 2 MG capsule Take 2 mg by mouth at bedtime.        . vitamin C (ASCORBIC ACID) 250 MG tablet Take 250 mg by mouth daily.      . ESTRACE VAGINAL 0.1 MG/GM vaginal   cream Place 2 g vaginally 2 (two) times a week.       . DISCONTD: lithium carbonate 150 MG capsule Take 1 capsule (150 mg total) by mouth daily at 12 noon.  30 capsule  2   Current Facility-Administered Medications  Medication Dose Route Frequency Provider Last Rate Last Dose  . methylPREDNISolone acetate (DEPO-MEDROL) injection 40 mg  40 mg Intra-articular Once Stanley E Harrison, MD        Allergies as of 10/01/2011 - Review Complete 10/01/2011  Allergen Reaction Noted  . Cephalexin Rash and Other (See Comments)   . Penicillins Rash     Family History  Problem Relation Age of Onset  . Arthritis    . Asthma    . Diabetes      History   Social History  . Marital Status: Single    Spouse Name: N/A    Number of Children: N/A  . Years of Education: 12   Occupational History  . retired    Social History Main Topics  . Smoking status:  Never Smoker   . Smokeless tobacco: Not on file  . Alcohol Use: No  . Drug Use: No  . Sexually Active: Not on file   Other Topics Concern  . Not on file   Social History Narrative  . No narrative on file    Review of Systems: Gen: Denies any fever, chills, sweats, anorexia, fatigue, weakness, malaise, weight loss, and sleep disorder CV: Denies chest pain, angina, palpitations, syncope, orthopnea, PND, peripheral edema, and claudication. Resp: Denies dyspnea at rest, dyspnea with exercise, cough, sputum, wheezing, coughing up blood, and pleurisy. GI: Denies vomiting blood, jaundice, and fecal incontinence.   Denies dysphagia or odynophagia. GU : Denies urinary burning, blood in urine, urinary frequency, urinary hesitancy, nocturnal urination, and urinary incontinence. MS: Denies joint pain, limitation of movement, and swelling, stiffness, low back pain, extremity pain. Denies muscle weakness, cramps, atrophy.  Derm: Denies rash, itching, dry skin, hives, moles, warts, or unhealing ulcers.  Psych: Denies depression, anxiety, memory loss, suicidal ideation, hallucinations, paranoia, and confusion. Heme: Denies bruising, bleeding, and enlarged lymph nodes.  Physical Exam: BP 145/82  Pulse 96  Temp(Src) 98.1 F (36.7 C) (Temporal)  Ht 5' 6" (1.676 m)  Wt 201 lb 3.2 oz (91.264 kg)  BMI 32.47 kg/m2 General:  Frail elderly alert lady accompanied by her sister, pleasant and cooperative in NAD urine and lites with a walker. Head:  Normocephalic and atraumatic. Eyes:  Sclera clear, no icterus.   Conjunctiva pink. Ears:  Normal auditory acuity. Nose:  No deformity, discharge,  or lesions. Mouth:  No deformity or lesions, dentition normal. Neck:  Supple; no masses or thyromegaly. Lungs:  Clear throughout to auscultation.   No wheezes, crackles, or rhonchi. No acute distress. Heart:  Regular rate and rhythm; no murmurs, clicks, rubs,  or gallops. Abdomen:  Obese Soft, nontender and  nondistended. Almost golf ball sized umbilical hernia protuberant and easily reducible. No masses, hepatosplenomegaly or hernias noted. Normal bowel sounds, without guarding, and without rebound.   Rectal:  Good sphincter tone. No mass rectal vault. No stool in rectal vault. Mucus Hemoccult-negative  Msk:  Symmetrical without gross deformities. Normal posture. Pulses:  Normal pulses noted. Extremities:  Without clubbing or edema. Neurologic:  Alert and  oriented x4;  grossly normal neurologically. Skin:  Intact without significant lesions or rashes. Cervical Nodes:  No significant cervical adenopathy. Psych:  Alert and cooperative. Normal mood and affect.  

## 2011-10-02 DIAGNOSIS — IMO0001 Reserved for inherently not codable concepts without codable children: Secondary | ICD-10-CM | POA: Diagnosis not present

## 2011-10-02 DIAGNOSIS — M159 Polyosteoarthritis, unspecified: Secondary | ICD-10-CM | POA: Diagnosis not present

## 2011-10-02 DIAGNOSIS — R55 Syncope and collapse: Secondary | ICD-10-CM | POA: Diagnosis not present

## 2011-10-02 DIAGNOSIS — I10 Essential (primary) hypertension: Secondary | ICD-10-CM | POA: Diagnosis not present

## 2011-10-02 DIAGNOSIS — F329 Major depressive disorder, single episode, unspecified: Secondary | ICD-10-CM | POA: Diagnosis not present

## 2011-10-02 DIAGNOSIS — R262 Difficulty in walking, not elsewhere classified: Secondary | ICD-10-CM | POA: Diagnosis not present

## 2011-10-04 DIAGNOSIS — Z6832 Body mass index (BMI) 32.0-32.9, adult: Secondary | ICD-10-CM | POA: Diagnosis not present

## 2011-10-04 DIAGNOSIS — I10 Essential (primary) hypertension: Secondary | ICD-10-CM | POA: Diagnosis not present

## 2011-10-04 DIAGNOSIS — M549 Dorsalgia, unspecified: Secondary | ICD-10-CM | POA: Diagnosis not present

## 2011-10-04 DIAGNOSIS — F329 Major depressive disorder, single episode, unspecified: Secondary | ICD-10-CM | POA: Diagnosis not present

## 2011-10-04 DIAGNOSIS — R55 Syncope and collapse: Secondary | ICD-10-CM | POA: Diagnosis not present

## 2011-10-04 DIAGNOSIS — IMO0001 Reserved for inherently not codable concepts without codable children: Secondary | ICD-10-CM | POA: Diagnosis not present

## 2011-10-04 DIAGNOSIS — E669 Obesity, unspecified: Secondary | ICD-10-CM | POA: Diagnosis not present

## 2011-10-04 DIAGNOSIS — R262 Difficulty in walking, not elsewhere classified: Secondary | ICD-10-CM | POA: Diagnosis not present

## 2011-10-04 DIAGNOSIS — M159 Polyosteoarthritis, unspecified: Secondary | ICD-10-CM | POA: Diagnosis not present

## 2011-10-07 DIAGNOSIS — R262 Difficulty in walking, not elsewhere classified: Secondary | ICD-10-CM | POA: Diagnosis not present

## 2011-10-07 DIAGNOSIS — R55 Syncope and collapse: Secondary | ICD-10-CM | POA: Diagnosis not present

## 2011-10-07 DIAGNOSIS — F329 Major depressive disorder, single episode, unspecified: Secondary | ICD-10-CM | POA: Diagnosis not present

## 2011-10-07 DIAGNOSIS — I10 Essential (primary) hypertension: Secondary | ICD-10-CM | POA: Diagnosis not present

## 2011-10-07 DIAGNOSIS — M159 Polyosteoarthritis, unspecified: Secondary | ICD-10-CM | POA: Diagnosis not present

## 2011-10-07 DIAGNOSIS — IMO0001 Reserved for inherently not codable concepts without codable children: Secondary | ICD-10-CM | POA: Diagnosis not present

## 2011-10-09 DIAGNOSIS — F329 Major depressive disorder, single episode, unspecified: Secondary | ICD-10-CM | POA: Diagnosis not present

## 2011-10-09 DIAGNOSIS — R55 Syncope and collapse: Secondary | ICD-10-CM | POA: Diagnosis not present

## 2011-10-09 DIAGNOSIS — M159 Polyosteoarthritis, unspecified: Secondary | ICD-10-CM | POA: Diagnosis not present

## 2011-10-09 DIAGNOSIS — IMO0001 Reserved for inherently not codable concepts without codable children: Secondary | ICD-10-CM | POA: Diagnosis not present

## 2011-10-09 DIAGNOSIS — I10 Essential (primary) hypertension: Secondary | ICD-10-CM | POA: Diagnosis not present

## 2011-10-09 DIAGNOSIS — R262 Difficulty in walking, not elsewhere classified: Secondary | ICD-10-CM | POA: Diagnosis not present

## 2011-10-11 DIAGNOSIS — I10 Essential (primary) hypertension: Secondary | ICD-10-CM | POA: Diagnosis not present

## 2011-10-11 DIAGNOSIS — F329 Major depressive disorder, single episode, unspecified: Secondary | ICD-10-CM | POA: Diagnosis not present

## 2011-10-11 DIAGNOSIS — IMO0001 Reserved for inherently not codable concepts without codable children: Secondary | ICD-10-CM | POA: Diagnosis not present

## 2011-10-11 DIAGNOSIS — M159 Polyosteoarthritis, unspecified: Secondary | ICD-10-CM | POA: Diagnosis not present

## 2011-10-11 DIAGNOSIS — R262 Difficulty in walking, not elsewhere classified: Secondary | ICD-10-CM | POA: Diagnosis not present

## 2011-10-11 DIAGNOSIS — R55 Syncope and collapse: Secondary | ICD-10-CM | POA: Diagnosis not present

## 2011-10-14 DIAGNOSIS — F329 Major depressive disorder, single episode, unspecified: Secondary | ICD-10-CM | POA: Diagnosis not present

## 2011-10-14 DIAGNOSIS — R262 Difficulty in walking, not elsewhere classified: Secondary | ICD-10-CM | POA: Diagnosis not present

## 2011-10-14 DIAGNOSIS — IMO0001 Reserved for inherently not codable concepts without codable children: Secondary | ICD-10-CM | POA: Diagnosis not present

## 2011-10-14 DIAGNOSIS — M159 Polyosteoarthritis, unspecified: Secondary | ICD-10-CM | POA: Diagnosis not present

## 2011-10-14 DIAGNOSIS — I10 Essential (primary) hypertension: Secondary | ICD-10-CM | POA: Diagnosis not present

## 2011-10-14 DIAGNOSIS — R55 Syncope and collapse: Secondary | ICD-10-CM | POA: Diagnosis not present

## 2011-10-16 DIAGNOSIS — R262 Difficulty in walking, not elsewhere classified: Secondary | ICD-10-CM | POA: Diagnosis not present

## 2011-10-16 DIAGNOSIS — R55 Syncope and collapse: Secondary | ICD-10-CM | POA: Diagnosis not present

## 2011-10-16 DIAGNOSIS — I10 Essential (primary) hypertension: Secondary | ICD-10-CM | POA: Diagnosis not present

## 2011-10-16 DIAGNOSIS — M159 Polyosteoarthritis, unspecified: Secondary | ICD-10-CM | POA: Diagnosis not present

## 2011-10-16 DIAGNOSIS — F329 Major depressive disorder, single episode, unspecified: Secondary | ICD-10-CM | POA: Diagnosis not present

## 2011-10-16 DIAGNOSIS — IMO0001 Reserved for inherently not codable concepts without codable children: Secondary | ICD-10-CM | POA: Diagnosis not present

## 2011-10-18 DIAGNOSIS — I10 Essential (primary) hypertension: Secondary | ICD-10-CM | POA: Diagnosis not present

## 2011-10-18 DIAGNOSIS — IMO0001 Reserved for inherently not codable concepts without codable children: Secondary | ICD-10-CM | POA: Diagnosis not present

## 2011-10-18 DIAGNOSIS — R262 Difficulty in walking, not elsewhere classified: Secondary | ICD-10-CM | POA: Diagnosis not present

## 2011-10-18 DIAGNOSIS — F329 Major depressive disorder, single episode, unspecified: Secondary | ICD-10-CM | POA: Diagnosis not present

## 2011-10-18 DIAGNOSIS — M159 Polyosteoarthritis, unspecified: Secondary | ICD-10-CM | POA: Diagnosis not present

## 2011-10-18 DIAGNOSIS — R55 Syncope and collapse: Secondary | ICD-10-CM | POA: Diagnosis not present

## 2011-10-22 ENCOUNTER — Ambulatory Visit (HOSPITAL_COMMUNITY): Payer: Self-pay | Admitting: Psychiatry

## 2011-10-22 ENCOUNTER — Ambulatory Visit (INDEPENDENT_AMBULATORY_CARE_PROVIDER_SITE_OTHER): Payer: Medicare Other | Admitting: Psychiatry

## 2011-10-22 ENCOUNTER — Encounter (HOSPITAL_COMMUNITY): Payer: Self-pay | Admitting: Psychiatry

## 2011-10-22 DIAGNOSIS — I10 Essential (primary) hypertension: Secondary | ICD-10-CM | POA: Diagnosis not present

## 2011-10-22 DIAGNOSIS — R262 Difficulty in walking, not elsewhere classified: Secondary | ICD-10-CM | POA: Diagnosis not present

## 2011-10-22 DIAGNOSIS — F329 Major depressive disorder, single episode, unspecified: Secondary | ICD-10-CM

## 2011-10-22 DIAGNOSIS — F331 Major depressive disorder, recurrent, moderate: Secondary | ICD-10-CM

## 2011-10-22 DIAGNOSIS — M159 Polyosteoarthritis, unspecified: Secondary | ICD-10-CM | POA: Diagnosis not present

## 2011-10-22 DIAGNOSIS — IMO0001 Reserved for inherently not codable concepts without codable children: Secondary | ICD-10-CM | POA: Diagnosis not present

## 2011-10-22 DIAGNOSIS — F411 Generalized anxiety disorder: Secondary | ICD-10-CM

## 2011-10-22 DIAGNOSIS — R55 Syncope and collapse: Secondary | ICD-10-CM | POA: Diagnosis not present

## 2011-10-22 MED ORDER — BUSPIRONE HCL 5 MG PO TABS: 5.0000 mg | ORAL_TABLET | Freq: Three times a day (TID) | ORAL | Status: DC

## 2011-10-22 MED ORDER — LORAZEPAM 0.5 MG PO TABS: 0.5000 mg | ORAL_TABLET | Freq: Four times a day (QID) | ORAL | Status: DC | PRN

## 2011-10-22 MED ORDER — PAROXETINE HCL 40 MG PO TABS: 40.0000 mg | ORAL_TABLET | ORAL | Status: DC

## 2011-10-22 NOTE — Progress Notes (Signed)
Chief complaint I am doing better.    History of presenting illness Patient is 71 year old Caucasian female who came with her sister for her followup appointment. Patient is taking BuSpar 5 mg twice a day.  Last month she was seen in emergency department due to fainting .  She was found to be dehydrated.  She has blood work which shows normal WBC count and chronic enzyme.  Her creatinine was mildly elevated which was corrected after IV fluid.  Overall she likes her BuSpar.  She does not have tremors.  She has more energy and motivation to do things.  She denies any recent feeling of hopelessness or helplessness.  She still has some anxiety but overall her symptoms are less intense and less frequent.  She sleeping better.  She denies any agitation anger mood swing.  She denies any recent jerky movements of her hands.  She's not drinking or using any illegal substance.    Current psychiatric medication BuSpar 5 mg twice a day  lorazepam 0.5 mg 4 times a day Paxil 40 mg daily  Past psychiatric history Patient has been seeing psychiatrist since 35 in this office. She is diagnosed with major depressive disorder. She has been stable on lithium and Paxil for many years. Her last psychiatric inpatient treatment was in 90s. She admitted history of mania, depression and psychosis.  Medical history Patient has history of arthritis blood pressure and chronic pain. Her primary care physician is Dr. Phillips Odor at [facility].  She takes medication for her blood pressure and arthritis.  She had history of jerking movements, fall and generalized pain.  She was recently seen in emergency department due to dehydration.    Mental status examination Patient is casually dressed and fairly groomed.  She uses walker for walking.  She is pleasant and cooperative.  She described her mood is good and her affect is mood congruent..  She maintained good eye contact.  Her speech is slow but fluent clear and  coherent.  Her thought process is also slow but logical linear and goal-directed.  Her attention and concentration is fair.  She has some difficulty recalling old events but overall she is relevant in conversation.  She denies any auditory or visual hallucination.  She denies any active or passive suicidal thinking and homicidal thinking.  There were no paranoia or delusions or psychotic symptoms present at this time.  She's alert and oriented x3.  Her insight judgment and impulse control is okay.  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  Her creatinine level is 1.09 and her TSH is 0.215.  Her hemoglobin is 11.5 and her WBC count is within normal limit.  House continue her current psychiatric medication.  Patient is doing much better with BuSpar .  She is off from lithium.  I recommend to call us if she is a question or concern about the medication or if he feel worsening of the symptoms.  Time spent 30 minutes.  I will see her again in 2 months.  Portion of this note is generated with voice recognition software and may contain typographical error.

## 2011-10-24 DIAGNOSIS — F329 Major depressive disorder, single episode, unspecified: Secondary | ICD-10-CM | POA: Diagnosis not present

## 2011-10-24 DIAGNOSIS — I10 Essential (primary) hypertension: Secondary | ICD-10-CM | POA: Diagnosis not present

## 2011-10-24 DIAGNOSIS — R262 Difficulty in walking, not elsewhere classified: Secondary | ICD-10-CM | POA: Diagnosis not present

## 2011-10-24 DIAGNOSIS — M159 Polyosteoarthritis, unspecified: Secondary | ICD-10-CM | POA: Diagnosis not present

## 2011-10-24 DIAGNOSIS — IMO0001 Reserved for inherently not codable concepts without codable children: Secondary | ICD-10-CM | POA: Diagnosis not present

## 2011-10-24 DIAGNOSIS — R55 Syncope and collapse: Secondary | ICD-10-CM | POA: Diagnosis not present

## 2011-10-25 ENCOUNTER — Encounter (HOSPITAL_COMMUNITY): Payer: Self-pay | Admitting: Pharmacy Technician

## 2011-10-25 DIAGNOSIS — IMO0001 Reserved for inherently not codable concepts without codable children: Secondary | ICD-10-CM | POA: Diagnosis not present

## 2011-10-25 DIAGNOSIS — R262 Difficulty in walking, not elsewhere classified: Secondary | ICD-10-CM | POA: Diagnosis not present

## 2011-10-25 DIAGNOSIS — R55 Syncope and collapse: Secondary | ICD-10-CM | POA: Diagnosis not present

## 2011-10-25 DIAGNOSIS — F329 Major depressive disorder, single episode, unspecified: Secondary | ICD-10-CM | POA: Diagnosis not present

## 2011-10-25 DIAGNOSIS — I10 Essential (primary) hypertension: Secondary | ICD-10-CM | POA: Diagnosis not present

## 2011-10-25 DIAGNOSIS — M159 Polyosteoarthritis, unspecified: Secondary | ICD-10-CM | POA: Diagnosis not present

## 2011-10-30 MED ORDER — SODIUM CHLORIDE 0.45 % IV SOLN
Freq: Once | INTRAVENOUS | Status: AC
  Administered 2011-10-31: 08:00:00 via INTRAVENOUS

## 2011-10-31 ENCOUNTER — Encounter (HOSPITAL_COMMUNITY): Payer: Self-pay | Admitting: *Deleted

## 2011-10-31 ENCOUNTER — Ambulatory Visit (HOSPITAL_COMMUNITY)
Admission: RE | Admit: 2011-10-31 | Discharge: 2011-10-31 | Disposition: A | Discharge status: Home / Self Care | Payer: Medicare Other | Source: Ambulatory Visit | Attending: Internal Medicine | Admitting: Internal Medicine

## 2011-10-31 ENCOUNTER — Encounter (HOSPITAL_COMMUNITY)
Admission: RE | Disposition: A | Discharge status: Home / Self Care | Payer: Self-pay | Source: Ambulatory Visit | Attending: Internal Medicine

## 2011-10-31 DIAGNOSIS — K5909 Other constipation: Secondary | ICD-10-CM | POA: Insufficient documentation

## 2011-10-31 DIAGNOSIS — K222 Esophageal obstruction: Secondary | ICD-10-CM | POA: Diagnosis not present

## 2011-10-31 DIAGNOSIS — E785 Hyperlipidemia, unspecified: Secondary | ICD-10-CM | POA: Diagnosis not present

## 2011-10-31 DIAGNOSIS — R109 Unspecified abdominal pain: Secondary | ICD-10-CM | POA: Diagnosis not present

## 2011-10-31 DIAGNOSIS — R1310 Dysphagia, unspecified: Secondary | ICD-10-CM

## 2011-10-31 DIAGNOSIS — K449 Diaphragmatic hernia without obstruction or gangrene: Secondary | ICD-10-CM

## 2011-10-31 DIAGNOSIS — K5900 Constipation, unspecified: Secondary | ICD-10-CM | POA: Diagnosis not present

## 2011-10-31 HISTORY — PX: SAVORY DILATION: SHX5439

## 2011-10-31 HISTORY — PX: MALONEY DILATION: SHX5535

## 2011-10-31 SURGERY — COLONOSCOPY WITH ESOPHAGOGASTRODUODENOSCOPY (EGD)
Anesthesia: Moderate Sedation

## 2011-10-31 MED ORDER — MEPERIDINE HCL 100 MG/ML IJ SOLN
INTRAMUSCULAR | Status: AC
  Filled 2011-10-31: qty 2

## 2011-10-31 MED ORDER — MEPERIDINE HCL 100 MG/ML IJ SOLN
INTRAMUSCULAR | Status: DC | PRN
  Administered 2011-10-31: 50 mg via INTRAVENOUS
  Administered 2011-10-31 (×2): 25 mg via INTRAVENOUS

## 2011-10-31 MED ORDER — BUTAMBEN-TETRACAINE-BENZOCAINE 2-2-14 % EX AERO
INHALATION_SPRAY | CUTANEOUS | Status: DC | PRN
  Administered 2011-10-31: 2 via TOPICAL

## 2011-10-31 MED ORDER — STERILE WATER FOR IRRIGATION IR SOLN
Status: DC | PRN
  Administered 2011-10-31: 09:00:00

## 2011-10-31 MED ORDER — MIDAZOLAM HCL 5 MG/5ML IJ SOLN
INTRAMUSCULAR | Status: DC | PRN
  Administered 2011-10-31: 1 mg via INTRAVENOUS
  Administered 2011-10-31: 2 mg via INTRAVENOUS
  Administered 2011-10-31 (×2): 1 mg via INTRAVENOUS

## 2011-10-31 MED ORDER — MIDAZOLAM HCL 5 MG/5ML IJ SOLN
INTRAMUSCULAR | Status: AC
  Filled 2011-10-31: qty 10

## 2011-10-31 NOTE — Interval H&P Note (Signed)
History and Physical Interval Note:  10/31/2011 8:44 AM  Amy Clay  has presented today for surgery, with the diagnosis of wt loss, change in bowel habits, dysphagia  The various methods of treatment have been discussed with the patient and family. After consideration of risks, benefits and other options for treatment, the patient has consented to  Procedure(s) (LRB): COLONOSCOPY WITH ESOPHAGOGASTRODUODENOSCOPY (EGD) (N/A) SAVORY DILATION (N/A) MALONEY DILATION (N/A) as a surgical intervention .  The patient's history has been reviewed, patient examined, no change in status, stable for surgery.  I have reviewed the patients' chart and labs.  Questions were answered to the patient's satisfaction.     Eula Listen  As above. Umbilical hernia not causing her that much a problem since being in the office. Chronic constipation now well managed with MiraLax and prune juice. Otherwise, plan as per attached office note

## 2011-10-31 NOTE — Op Note (Signed)
[facility] [address] [place], [place]  [address]  COLONOSCOPY PROCEDURE REPORT  PATIENT:  Amy Clay, Amy Clay  MR#:  [id number] BIRTHDATE:  October 21, 1940, 70 yrs. old  GENDER:  female ENDOSCOPIST:  R. Roetta Sessions, MD FACP [facility] REF. BY:  Assunta Found, M.D. PROCEDURE DATE:  10/31/2011 PROCEDURE:  Diagnostic colonoscopy  INDICATIONS:  Intermittent constipation; last colonoscopy 10 years ago  INFORMED CONSENT:  The risks, benefits, alternatives and imponderables including but not limited to bleeding, perforation as well as the possibility of a missed lesion have been reviewed. The potential for biopsy, lesion removal, etc. have also been discussed.  Questions have been answered.  All parties agreeable. Please see the history and physical in the medical record for more information.  MEDICATIONS:  Versed 5 mg IV and Demerol 100 mg IV in divided doses.  DESCRIPTION OF PROCEDURE:  After a digital rectal exam was performed, the EC-[id number] ([id number]) colonoscope was advanced from the anus through the rectum and colon to the area of the cecum, ileocecal valve and appendiceal orifice.  The cecum was deeply intubated.  These structures were well-seen and photographed for the record.  From the level of the cecum and ileocecal valve, the scope was slowly and cautiously withdrawn.  The mucosal surfaces were carefully surveyed utilizing scope tip deflection to facilitate fold flattening as needed.  The scope was pulled down into the rectum where a thorough examination including retroflexion was performed. <<PROCEDUREIMAGES>>  FINDINGS: Marginal prep particularly on the right side. Anal papilla; otherwise, normal rectum. Redundant but otherwise normal appearing colon. Poor prep on the right side made the exam more difficult. A small subtle/flat lesion may not have been seen today because of the prep.  THERAPEUTIC / DIAGNOSTIC MANEUVERS PERFORMED: None  COMPLICATIONS:   None  CECAL WITHDRAWAL TIME: 8 minutes  IMPRESSION:  Anal papilla. Redundant colon.  RECOMMENDATIONS:  Continue MiraLax/Prune juice regimen-this seems to be working fairly well for her at this time. See EGD report. If she has recurrent abdominal pain referrable to her umbilical hernia, surgical consultation surgical would be recommended.  ______________________________ R. Roetta Sessions, MD Caleen Essex  CC:  Assunta Found, M.D.  n. eSIGNED:   R. Roetta Sessions at 10/31/2011 09:43 AM  Koren Bound, [phone]

## 2011-10-31 NOTE — H&P (View-Only) (Signed)
Referring Provider: Colette Ribas, MD Primary Care Physician:  Colette Ribas, MD, MD Primary Gastroenterologist:  Dr.  Jena Gauss  Chief Complaint  Patient presents with  . Abdominal Pain  . Nausea    HPI:  Amy Clay is a 71 y.o. female here as a referral from Dr. Phillips Odor for further evaluation intermittent abdominal pain and nausea. Symptoms insidiously progressed over the past couple months; she states she's lost 12 pounds the past one month.  GERD symptoms well controlled on Prilosec. Reports intermittent soft with dysphagia to solids. Notes a "bulging belly button" from time to time since cholecystectomy 1995. Bulge associated with some degree of her abdominal pain which she's been experiencing. Worsening of constipation recently no rectal bleeding. Takes Epson salts and MiraLax with variable results. States lifelong constipation. However, I note that she underwent colonoscopy by Dr. Karilyn Cota about 10 years ago for diarrhea. She had external hemorrhoids otherwise normal examination. Don't have an element of interval bowel syndrome at that time. No family history GI neoplasia. Of note, she does take meloxicam for osteoarthritis.  Past Medical History  Diagnosis Date  . High blood pressure   . Arthritis   . Depression   . Anxiety   . Hemorrhoids   . Hyperlipidemia     Past Surgical History  Procedure Date  . Back surgery 03/16/87  . Cholecystectomy 08/15/93  . Partial hysterectomy 04/04/95  . Bladder tacked 04/04/95  . Ankle surgery 04/28/97    right ankle fracture  . Wrist surgery 02/24/09    right wrist fracture  . Colonoscopy 10/26/2002    Dr. Karilyn Cota- small external hemorrhoids otherwise normal   . Breast biopsy 05/16/95    right side    Current Outpatient Prescriptions  Medication Sig Dispense Refill  . acetaminophen (TYLENOL) 650 MG CR tablet Take 650 mg by mouth every 8 (eight) hours as needed. pain      . aspirin EC 81 MG tablet Take 81 mg by mouth daily.        . busPIRone (BUSPAR) 5 MG tablet Take 1 tablet (5 mg total) by mouth 3 (three) times daily.  90 tablet  1  . calcium carbonate (OS-CAL) 600 MG TABS Take 600 mg by mouth 2 (two) times daily with a meal.        . Cholecalciferol (VITAMIN D) 2000 UNITS CAPS Take 1 capsule by mouth daily.      Marland Kitchen LORazepam (ATIVAN) 0.5 MG tablet Take 1 tablet (0.5 mg total) by mouth 4 (four) times daily as needed for anxiety.  120 tablet  1  . LUTEIN-ZEAXANTHIN PO Take by mouth.        . meloxicam (MOBIC) 15 MG tablet Take 15 mg by mouth daily.       Marland Kitchen omeprazole (PRILOSEC) 20 MG capsule Take 20 mg by mouth daily.       . ondansetron (ZOFRAN) 4 MG tablet Take 4 mg by mouth every 8 (eight) hours as needed. nausea      . PARoxetine (PAXIL) 40 MG tablet Take 1 tablet (40 mg total) by mouth every morning.  30 tablet  1  . solifenacin (VESICARE) 10 MG tablet Take 10 mg by mouth daily.       Marland Kitchen terazosin (HYTRIN) 2 MG capsule Take 2 mg by mouth at bedtime.        . vitamin C (ASCORBIC ACID) 250 MG tablet Take 250 mg by mouth daily.      Marland Kitchen ESTRACE VAGINAL 0.1 MG/GM vaginal  cream Place 2 g vaginally 2 (two) times a week.       Marland Kitchen DISCONTD: lithium carbonate 150 MG capsule Take 1 capsule (150 mg total) by mouth daily at 12 noon.  30 capsule  2   Current Facility-Administered Medications  Medication Dose Route Frequency Provider Last Rate Last Dose  . methylPREDNISolone acetate (DEPO-MEDROL) injection 40 mg  40 mg Intra-articular Once Vickki Hearing, MD        Allergies as of 10/01/2011 - Review Complete 10/01/2011  Allergen Reaction Noted  . Cephalexin Rash and Other (See Comments)   . Penicillins Rash     Family History  Problem Relation Age of Onset  . Arthritis    . Asthma    . Diabetes      History   Social History  . Marital Status: Single    Spouse Name: N/A    Number of Children: N/A  . Years of Education: 12   Occupational History  . retired    Social History Main Topics  . Smoking status:  Never Smoker   . Smokeless tobacco: Not on file  . Alcohol Use: No  . Drug Use: No  . Sexually Active: Not on file   Other Topics Concern  . Not on file   Social History Narrative  . No narrative on file    Review of Systems: Gen: Denies any fever, chills, sweats, anorexia, fatigue, weakness, malaise, weight loss, and sleep disorder CV: Denies chest pain, angina, palpitations, syncope, orthopnea, PND, peripheral edema, and claudication. Resp: Denies dyspnea at rest, dyspnea with exercise, cough, sputum, wheezing, coughing up blood, and pleurisy. GI: Denies vomiting blood, jaundice, and fecal incontinence.   Denies dysphagia or odynophagia. GU : Denies urinary burning, blood in urine, urinary frequency, urinary hesitancy, nocturnal urination, and urinary incontinence. MS: Denies joint pain, limitation of movement, and swelling, stiffness, low back pain, extremity pain. Denies muscle weakness, cramps, atrophy.  Derm: Denies rash, itching, dry skin, hives, moles, warts, or unhealing ulcers.  Psych: Denies depression, anxiety, memory loss, suicidal ideation, hallucinations, paranoia, and confusion. Heme: Denies bruising, bleeding, and enlarged lymph nodes.  Physical Exam: BP 145/82  Pulse 96  Temp(Src) 98.1 F (36.7 C) (Temporal)  Ht 5\' 6"  (1.676 m)  Wt 201 lb 3.2 oz (91.264 kg)  BMI 32.47 kg/m2 General:  Frail elderly alert lady accompanied by her sister, pleasant and cooperative in NAD urine and lites with a walker. Head:  Normocephalic and atraumatic. Eyes:  Sclera clear, no icterus.   Conjunctiva pink. Ears:  Normal auditory acuity. Nose:  No deformity, discharge,  or lesions. Mouth:  No deformity or lesions, dentition normal. Neck:  Supple; no masses or thyromegaly. Lungs:  Clear throughout to auscultation.   No wheezes, crackles, or rhonchi. No acute distress. Heart:  Regular rate and rhythm; no murmurs, clicks, rubs,  or gallops. Abdomen:  Obese Soft, nontender and  nondistended. Almost golf ball sized umbilical hernia protuberant and easily reducible. No masses, hepatosplenomegaly or hernias noted. Normal bowel sounds, without guarding, and without rebound.   Rectal:  Good sphincter tone. No mass rectal vault. No stool in rectal vault. Mucus Hemoccult-negative  Msk:  Symmetrical without gross deformities. Normal posture. Pulses:  Normal pulses noted. Extremities:  Without clubbing or edema. Neurologic:  Alert and  oriented x4;  grossly normal neurologically. Skin:  Intact without significant lesions or rashes. Cervical Nodes:  No significant cervical adenopathy. Psych:  Alert and cooperative. Normal mood and affect.

## 2011-10-31 NOTE — Discharge Instructions (Addendum)
Continue MiraLax/prune juice regimen for constipation.  Continue omeprazole 20 mg daily.  If abdominal pain recurs, you will likely need to see the surgeon to get your umbilical hernia repaired   Colonoscopy Discharge Instructions  Read the instructions outlined below and refer to this sheet in the next few weeks. These discharge instructions provide you with general information on caring for yourself after you leave the hospital. Your doctor may also give you specific instructions. While your treatment has been planned according to the most current medical practices available, unavoidable complications occasionally occur. If you have any problems or questions after discharge, call Dr. Jena Gauss at [phone]. ACTIVITY  You may resume your regular activity, but move at a slower pace for the next 24 hours.   Take frequent rest periods for the next 24 hours.   Walking will help get rid of the air and reduce the bloated feeling in your belly (abdomen).   No driving for 24 hours (because of the medicine (anesthesia) used during the test).    Do not sign any important legal documents or operate any machinery for 24 hours (because of the anesthesia used during the test).  NUTRITION  Drink plenty of fluids.   You may resume your normal diet as instructed by your doctor.   Begin with a light meal and progress to your normal diet. Heavy or fried foods are harder to digest and may make you feel sick to your stomach (nauseated).   Avoid alcoholic beverages for 24 hours or as instructed.  MEDICATIONS  You may resume your normal medications unless your doctor tells you otherwise.  WHAT YOU CAN EXPECT TODAY  Some feelings of bloating in the abdomen.   Passage of more gas than usual.   Spotting of blood in your stool or on the toilet paper.  IF YOU HAD POLYPS REMOVED DURING THE COLONOSCOPY:  No aspirin products for 7 days or as instructed.   No alcohol for 7 days or as instructed.   Eat a  soft diet for the next 24 hours.  FINDING OUT THE RESULTS OF YOUR TEST Not all test results are available during your visit. If your test results are not back during the visit, make an appointment with your caregiver to find out the results. Do not assume everything is normal if you have not heard from your caregiver or the medical facility. It is important for you to follow up on all of your test results.  SEEK IMMEDIATE MEDICAL ATTENTION IF:  You have more than a spotting of blood in your stool.   Your belly is swollen (abdominal distention).   You are nauseated or vomiting.   You have a temperature over 101.   You have abdominal pain or discomfort that is severe or gets worse throughout the day.   EGD Discharge instructions Please read the instructions outlined below and refer to this sheet in the next few weeks. These discharge instructions provide you with general information on caring for yourself after you leave the hospital. Your doctor may also give you specific instructions. While your treatment has been planned according to the most current medical practices available, unavoidable complications occasionally occur. If you have any problems or questions after discharge, please call your doctor. ACTIVITY  You may resume your regular activity but move at a slower pace for the next 24 hours.   Take frequent rest periods for the next 24 hours.   Walking will help expel (get rid of) the air and reduce  the bloated feeling in your abdomen.   No driving for 24 hours (because of the anesthesia (medicine) used during the test).   You may shower.   Do not sign any important legal documents or operate any machinery for 24 hours (because of the anesthesia used during the test).  NUTRITION  Drink plenty of fluids.   You may resume your normal diet.   Begin with a light meal and progress to your normal diet.   Avoid alcoholic beverages for 24 hours or as instructed by your caregiver.   MEDICATIONS  You may resume your normal medications unless your caregiver tells you otherwise.  WHAT YOU CAN EXPECT TODAY  You may experience abdominal discomfort such as a feeling of fullness or "gas" pains.  FOLLOW-UP  Your doctor will discuss the results of your test with you.  SEEK IMMEDIATE MEDICAL ATTENTION IF ANY OF THE FOLLOWING OCCUR:  Excessive nausea (feeling sick to your stomach) and/or vomiting.   Severe abdominal pain and distention (swelling).   Trouble swallowing.   Temperature over 101 F (37.8 C).   Rectal bleeding or vomiting of blood.

## 2011-10-31 NOTE — Op Note (Signed)
[facility] [address] [place], [place]  [address]  ENDOSCOPY PROCEDURE REPORT  PATIENT:  Amy Clay, Amy Clay  MR#:  [id number] BIRTHDATE:  31-May-1940, 70 yrs. old  GENDER:  female  ENDOSCOPIST:  R. Roetta Sessions, MD Caleen Essex Referred by:  Assunta Found, M.D.  PROCEDURE DATE:  10/31/2011 PROCEDURE:  EGD with Elease Hashimoto dilation  INDICATIONS:   esophageal dysphagia; recent abdominal pain and nausea-these symptoms improved recently. Much of her abdominal pain  recently felt to be due to an intermittanly symptomtic umbilical hernia  INFORMED CONSENT:   The risks, benefits, limitations, alternatives and imponderables have been discussed.  The potential for biopsy, esophogeal dilation, etc. have also been reviewed.  Questions have been answered.  All parties agreeable.  Please see the history and physical in the medical record for more information.  MEDICATIONS:    Versed 4 mg IV and Demerol 100 mg IV in divided doses. Cetacaine spray.  DESCRIPTION OF PROCEDURE:   The EG-[id number] ([id number]) endoscope was introduced through the mouth and advanced to the second portion of the duodenum without difficulty or limitations.  The mucosal surfaces were surveyed very carefully during advancement of the scope and upon withdrawal.  Retroflexion view of the proximal stomach and esophagogastric junction was performed.  <<PROCEDUREIMAGES>>  FINDINGS:   Normal appearing patent tubular esophagus. Stomach empty ;  small hiatal hernia. Minimal streaky erythema of the gastric     antrum of doubtful clinical significance. No ulcer or infiltrating process. Pylorus patent. Normal first and second portion of the duodenum  THERAPEUTIC / DIAGNOSTIC MANEUVERS PERFORMED: A 56 French Maloney dilator was passed empirically to full insertion with ease. A look back revealed superficial tear through the UES mucosa and a possible possible cervical web.  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 Small hiatal hernia. Some minimal erythema the gastric                  mucosa of doubtful clinical significance.  RECOMMENDATIONS:  Continue omeprazole 20 mg orally daily. If the patient has rcurrent symptoms related to her umbilical hernia, then         surgical consultation should be considered. See colonoscopy report.  ______________________________ R. Roetta Sessions, MD Caleen Essex  CC:  n. eSIGNED:   R. Roetta Sessions at 10/31/2011 09:12 AM  Koren Bound, [phone]

## 2011-11-04 ENCOUNTER — Encounter (HOSPITAL_COMMUNITY): Payer: Self-pay | Admitting: Internal Medicine

## 2011-12-07 DIAGNOSIS — M159 Polyosteoarthritis, unspecified: Secondary | ICD-10-CM | POA: Diagnosis not present

## 2011-12-07 DIAGNOSIS — M2550 Pain in unspecified joint: Secondary | ICD-10-CM | POA: Diagnosis not present

## 2011-12-13 ENCOUNTER — Encounter (HOSPITAL_COMMUNITY): Payer: Self-pay | Admitting: *Deleted

## 2011-12-13 ENCOUNTER — Emergency Department (HOSPITAL_COMMUNITY)
Admission: EM | Admit: 2011-12-13 | Discharge: 2011-12-13 | Disposition: A | Discharge status: Home / Self Care | Payer: Medicare Other | Attending: Emergency Medicine | Admitting: Emergency Medicine

## 2011-12-13 ENCOUNTER — Emergency Department (HOSPITAL_COMMUNITY): Payer: Medicare Other

## 2011-12-13 DIAGNOSIS — M159 Polyosteoarthritis, unspecified: Secondary | ICD-10-CM | POA: Diagnosis not present

## 2011-12-13 DIAGNOSIS — M129 Arthropathy, unspecified: Secondary | ICD-10-CM | POA: Diagnosis not present

## 2011-12-13 DIAGNOSIS — Z880 Allergy status to penicillin: Secondary | ICD-10-CM | POA: Diagnosis not present

## 2011-12-13 DIAGNOSIS — M546 Pain in thoracic spine: Secondary | ICD-10-CM | POA: Insufficient documentation

## 2011-12-13 DIAGNOSIS — F411 Generalized anxiety disorder: Secondary | ICD-10-CM | POA: Diagnosis not present

## 2011-12-13 DIAGNOSIS — E860 Dehydration: Secondary | ICD-10-CM | POA: Diagnosis not present

## 2011-12-13 DIAGNOSIS — I10 Essential (primary) hypertension: Secondary | ICD-10-CM | POA: Diagnosis not present

## 2011-12-13 DIAGNOSIS — R1084 Generalized abdominal pain: Secondary | ICD-10-CM | POA: Diagnosis not present

## 2011-12-13 DIAGNOSIS — F319 Bipolar disorder, unspecified: Secondary | ICD-10-CM | POA: Insufficient documentation

## 2011-12-13 DIAGNOSIS — M549 Dorsalgia, unspecified: Secondary | ICD-10-CM

## 2011-12-13 DIAGNOSIS — R209 Unspecified disturbances of skin sensation: Secondary | ICD-10-CM | POA: Diagnosis not present

## 2011-12-13 DIAGNOSIS — E785 Hyperlipidemia, unspecified: Secondary | ICD-10-CM | POA: Diagnosis not present

## 2011-12-13 DIAGNOSIS — B9789 Other viral agents as the cause of diseases classified elsewhere: Secondary | ICD-10-CM | POA: Diagnosis not present

## 2011-12-13 DIAGNOSIS — R109 Unspecified abdominal pain: Secondary | ICD-10-CM | POA: Diagnosis not present

## 2011-12-13 DIAGNOSIS — Z6832 Body mass index (BMI) 32.0-32.9, adult: Secondary | ICD-10-CM | POA: Diagnosis not present

## 2011-12-13 HISTORY — DX: Bipolar disorder, unspecified: F31.9

## 2011-12-13 LAB — URINALYSIS, ROUTINE W REFLEX MICROSCOPIC
Bilirubin Urine: NEGATIVE
Glucose, UA: NEGATIVE mg/dL
Hgb urine dipstick: NEGATIVE
Ketones, ur: NEGATIVE mg/dL
Protein, ur: NEGATIVE mg/dL
pH: 7 (ref 5.0–8.0)

## 2011-12-13 LAB — COMPREHENSIVE METABOLIC PANEL
ALT: 10 U/L (ref 0–35)
AST: 21 U/L (ref 0–37)
Albumin: 3.6 g/dL (ref 3.5–5.2)
Alkaline Phosphatase: 89 U/L (ref 39–117)
BUN: 17 mg/dL (ref 6–23)
Chloride: 103 mEq/L (ref 96–112)
Potassium: 4.6 mEq/L (ref 3.5–5.1)
Sodium: 137 mEq/L (ref 135–145)
Total Bilirubin: 0.3 mg/dL (ref 0.3–1.2)

## 2011-12-13 LAB — CBC WITH DIFFERENTIAL/PLATELET
Basophils Relative: 0 % (ref 0–1)
Hemoglobin: 12.2 g/dL (ref 12.0–15.0)
MCHC: 32.6 g/dL (ref 30.0–36.0)
Monocytes Relative: 6 % (ref 3–12)
Neutro Abs: 6 10*3/uL (ref 1.7–7.7)
Neutrophils Relative %: 80 % — ABNORMAL HIGH (ref 43–77)
Platelets: 206 10*3/uL (ref 150–400)
RBC: 4.01 MIL/uL (ref 3.87–5.11)

## 2011-12-13 MED ORDER — HYDROCODONE-ACETAMINOPHEN 5-325 MG PO TABS
1.0000 | ORAL_TABLET | Freq: Once | ORAL | Status: AC
  Administered 2011-12-13: 1 via ORAL
  Filled 2011-12-13: qty 1

## 2011-12-13 MED ORDER — METHYLPREDNISOLONE SODIUM SUCC 125 MG IJ SOLR
125.0000 mg | Freq: Once | INTRAMUSCULAR | Status: AC
  Administered 2011-12-13: 125 mg via INTRAVENOUS
  Filled 2011-12-13: qty 2

## 2011-12-13 MED ORDER — SODIUM CHLORIDE 0.9 % IV BOLUS (SEPSIS)
1000.0000 mL | Freq: Once | INTRAVENOUS | Status: AC
  Administered 2011-12-13: 1000 mL via INTRAVENOUS

## 2011-12-13 MED ORDER — HYDROMORPHONE HCL PF 1 MG/ML IJ SOLN
1.0000 mg | Freq: Once | INTRAMUSCULAR | Status: AC
  Administered 2011-12-13: 0.5 mg via INTRAVENOUS
  Filled 2011-12-13: qty 1

## 2011-12-13 MED ORDER — OXYCODONE-ACETAMINOPHEN 5-325 MG PO TABS: 1.0000 | ORAL_TABLET | Freq: Four times a day (QID) | ORAL | Status: AC | PRN

## 2011-12-13 MED ORDER — ONDANSETRON HCL 4 MG/2ML IJ SOLN
4.0000 mg | Freq: Once | INTRAMUSCULAR | Status: AC
  Administered 2011-12-13: 4 mg via INTRAVENOUS
  Filled 2011-12-13: qty 2

## 2011-12-13 MED ORDER — LORAZEPAM 1 MG PO TABS
0.5000 mg | ORAL_TABLET | Freq: Once | ORAL | Status: AC
  Administered 2011-12-13: 0.5 mg via ORAL
  Filled 2011-12-13: qty 1

## 2011-12-13 NOTE — ED Notes (Signed)
Pt c/o nausea, pain between shoulder blades, constipation, lower back pain on and off for a week, has been seen by Dr. Lamar Blinks office twice, once this past Saturday, given "shots" and was seen again today and advised to come to er because she was not feeling any better.

## 2011-12-13 NOTE — ED Notes (Signed)
Pt requesting something for her back pain, Dr. Estell Harpin notified, additional orders given

## 2011-12-13 NOTE — ED Notes (Signed)
Sent from Dr Beatrice Lecher office for dehydration.  Body aches, n/v,/d  Low back pain.

## 2011-12-13 NOTE — ED Notes (Signed)
Dr. Zammit in room with pt 

## 2011-12-13 NOTE — ED Provider Notes (Cosign Needed)
History   This chart was scribed for Amy Lennert, MD by Melba Coon. The patient was seen in room APA05/APA05 and the patient's care was started at 3:45PM.    CSN: [id number]  Arrival date & time 12/13/11  1500   First MD Initiated Contact with Patient 12/13/11 1527      Chief Complaint  Patient presents with  . Dehydration    (Consider location/radiation/quality/duration/timing/severity/associated sxs/prior treatment) Patient is a 71 y.o. female presenting with back pain. The history is provided by the patient. No language interpreter was used.  Back Pain  This is a new problem. The current episode started 1 to 2 hours ago. The problem occurs constantly. The problem has not changed since onset.The pain is associated with falling (fall was 3 months ago). Pain location: upper. The quality of the pain is described as burning. The pain does not radiate. The symptoms are aggravated by bending and twisting. Pertinent negatives include no chest pain, no fever, no numbness, no headaches, no abdominal pain, no dysuria and no weakness. Associated symptoms comments: Generalized body aches, nausea, vomit, diarrhea.  Pt was sent from PCP office for dehydration. Pt feels like "needle pins are sticking her". Pt states that she has never had the previous symptoms before. Allergic to Cephalexin and Penicillins. No other pertinent medical symptoms.  PCP: Dr. Phillips Odor  Past Medical History  Diagnosis Date  . High blood pressure   . Arthritis   . Depression   . Anxiety   . Hemorrhoids   . Hyperlipidemia   . Bipolar 1 disorder     Past Surgical History  Procedure Date  . Back surgery 03/16/87  . Cholecystectomy 08/15/93  . Partial hysterectomy 04/04/95  . Bladder tacked 04/04/95  . Ankle surgery 04/28/97    right ankle fracture  . Wrist surgery 02/24/09    right wrist fracture  . Colonoscopy 10/26/2002    Dr. Karilyn Cota- small external hemorrhoids otherwise normal   . Breast biopsy 05/16/95   right side  . Savory dilation 10/31/2011    Procedure: SAVORY DILATION;  Surgeon: Corbin Ade, MD;  Location: [facility];  Service: Endoscopy;  Laterality: N/A;  Elease Hashimoto dilation 10/31/2011    Procedure: Elease Hashimoto DILATION;  Surgeon: Corbin Ade, MD;  Location: [facility];  Service: Endoscopy;  Laterality: N/A;  . Abdominal hysterectomy     Family History  Problem Relation Age of Onset  . Arthritis    . Asthma    . Diabetes    . Colon cancer Neg Hx     History  Substance Use Topics  . Smoking status: Never Smoker   . Smokeless tobacco: Not on file  . Alcohol Use: No    OB History    Grav Para Term Preterm Abortions TAB SAB Ect Mult Living                  Review of Systems  Constitutional: Positive for chills. Negative for fever.       10 Systems reviewed and are negative for acute change except as noted in the HPI.  HENT: Negative for rhinorrhea.   Eyes: Negative for discharge and redness.  Respiratory: Negative for cough and shortness of breath.   Cardiovascular: Negative for chest pain.  Gastrointestinal: Positive for constipation. Negative for vomiting, abdominal pain and diarrhea.  Genitourinary: Positive for urgency. Negative for dysuria.  Musculoskeletal: Positive for back pain.  Skin: Negative for rash.  Neurological: Negative for dizziness, syncope, speech difficulty,  weakness, numbness and headaches.  Psychiatric/Behavioral: Negative for suicidal ideas, hallucinations and confusion.    Allergies  Cephalexin and Penicillins  Home Medications   Current Outpatient Rx  Name Route Sig Dispense Refill  . ASPIRIN EC 81 MG PO TBEC Oral Take 81 mg by mouth daily.    . OCUVITE PO TABS Oral Take 1 tablet by mouth daily.    . BUSPIRONE HCL 5 MG PO TABS Oral Take 1 tablet (5 mg total) by mouth 3 (three) times daily. 90 tablet 1  . CALCIUM CARBONATE 600 MG PO TABS Oral Take 600 mg by mouth 2 (two) times daily with a meal.     . VITAMIN D 2000 UNITS PO CAPS  Oral Take 1 capsule by mouth daily.    Marland Kitchen ESTRACE 0.1 MG/GM VA CREA Vaginal Place 2 g vaginally 2 (two) times a week.     Marland Kitchen LORAZEPAM 0.5 MG PO TABS Oral Take 1 tablet (0.5 mg total) by mouth 4 (four) times daily as needed for anxiety. 120 tablet 1  . LOSARTAN POTASSIUM 100 MG PO TABS Oral Take 100 mg by mouth every morning.     Marland Kitchen MELOXICAM 15 MG PO TABS Oral Take 15 mg by mouth daily.     Marland Kitchen OMEPRAZOLE 20 MG PO CPDR Oral Take 20 mg by mouth daily.     Marland Kitchen ONDANSETRON HCL 4 MG PO TABS Oral Take 4 mg by mouth every 8 (eight) hours as needed. nausea    . PAROXETINE HCL 40 MG PO TABS Oral Take 1 tablet (40 mg total) by mouth every morning. 30 tablet 1  . SOLIFENACIN SUCCINATE 10 MG PO TABS Oral Take 10 mg by mouth every morning.     Marland Kitchen TERAZOSIN HCL 1 MG PO CAPS Oral Take 1 mg by mouth at bedtime. Take with the 2 mg capsule at bedtime    . TERAZOSIN HCL 2 MG PO CAPS Oral Take 2 mg by mouth at bedtime. Take with the 1 mg capsule at bedtime    . VITAMIN C 250 MG PO TABS Oral Take 250 mg by mouth daily.    Marland Kitchen PEG-KCL-NACL-NASULF-NA ASC-C 100 G PO SOLR Oral Take 1 kit (100 g total) by mouth once. As directed Please purchase 1 Fleets enema to use with the prep 1 kit 0    BP 182/83  Pulse 68  Temp 98.7 F (37.1 C) (Oral)  Resp 20  Ht 5\' 6"  (1.676 m)  Wt 203 lb (92.08 kg)  BMI 32.76 kg/m2  SpO2 98%  Physical Exam  Nursing note and vitals reviewed. Constitutional: She is oriented to person, place, and time. She appears well-developed and well-nourished. No distress.  HENT:  Head: Normocephalic and atraumatic.  Right Ear: External ear normal.  Left Ear: External ear normal.  Mouth/Throat: Mucous membranes are dry.  Eyes: EOM are normal.  Neck: Normal range of motion. No tracheal deviation present.  Cardiovascular: Normal rate, regular rhythm and normal heart sounds.   No murmur heard. Pulmonary/Chest: Effort normal and breath sounds normal. No respiratory distress.  Abdominal: Soft. There is  tenderness (mild diffuse tenderness).  Musculoskeletal: Normal range of motion. She exhibits tenderness (mild tenderness throughout upper back).  Neurological: She is alert and oriented to person, place, and time.  Skin: Skin is warm and dry. No rash noted.  Psychiatric: She has a normal mood and affect. Her behavior is normal.    ED Course  Procedures (including critical care time)  DIAGNOSTIC STUDIES: Oxygen Saturation  is 99% on room air, normal by my interpretation.    COORDINATION OF CARE:  3:49PM - IV fluids, abd acute w/chest XR, blood w/u, and UA will be ordered for the pt. 6:00PM - norco/vicodin will be ordered for the pt. 6:40PM - lab and imaging results reviewed; results are negative; pt states that her back still has moderate burning discomfort in her entire back and pain meds did not alleviate the pain; dilaudid will be ordered for the pt. 7:49PM - dilaudid alleviated the back pain but pain is still present and pt feels anxious; Ativan will be ordered for the pt; pt recommended to f/u with PCP; pt will be ready for d/c.   Labs Reviewed  CBC WITH DIFFERENTIAL - Abnormal; Notable for the following:    Neutrophils Relative 80 (*)     All other components within normal limits  COMPREHENSIVE METABOLIC PANEL - Abnormal; Notable for the following:    Glucose, Bld 126 (*)     Creatinine, Ser 1.31 (*)     GFR calc non Af Amer 40 (*)     GFR calc Af Amer 46 (*)     All other components within normal limits  URINALYSIS, ROUTINE W REFLEX MICROSCOPIC   Dg Abd Acute W/chest  12/13/2011  *RADIOLOGY REPORT*  Clinical Data: Pain.  ACUTE ABDOMEN SERIES (ABDOMEN 2 VIEW & CHEST 1 VIEW)  Comparison: None.  Findings: The heart size is normal.  The lungs are clear.  Supine and upright views the abdomen demonstrate nonspecific bowel gas pattern.  There is no evidence for obstruction or free air. Mild rightward curvature is present in the lumbar spine.  The axial skeleton is otherwise  unremarkable.  IMPRESSION:  1.  No acute abnormality of the chest or abdomen. 2.  Mild curvature of the lumbar spine.  Original Report Authenticated By: Jamesetta Orleans. MATTERN, M.D.     No diagnosis found.   Date: 12/13/2011  Rate: 84  Rhythm: normal sinus rhythm  QRS Axis: normal  Intervals: normal  ST/T Wave abnormalities: nonspecific ST changes  Conduction Disutrbances:none  Narrative Interpretation:   Old EKG Reviewed: none available    MDM  The chart was scribed for me under my direct supervision.  I personally performed the history, physical, and medical decision making and all procedures in the evaluation of this patient.Amy Lennert, MD 12/13/11 [phone]

## 2011-12-13 NOTE — ED Notes (Addendum)
D/c instructions reviewed w/ pt and family - pt and family deny any further questions or concerns at present. Pt assisted via wheelchair to d/c by pt advocate.

## 2011-12-19 ENCOUNTER — Other Ambulatory Visit (HOSPITAL_COMMUNITY): Payer: Self-pay | Admitting: Family Medicine

## 2011-12-19 ENCOUNTER — Ambulatory Visit (HOSPITAL_COMMUNITY)
Admission: RE | Admit: 2011-12-19 | Discharge: 2011-12-19 | Disposition: A | Discharge status: Home / Self Care | Payer: Medicare Other | Source: Ambulatory Visit | Attending: Family Medicine | Admitting: Family Medicine

## 2011-12-19 DIAGNOSIS — M549 Dorsalgia, unspecified: Secondary | ICD-10-CM | POA: Diagnosis not present

## 2011-12-19 DIAGNOSIS — M533 Sacrococcygeal disorders, not elsewhere classified: Secondary | ICD-10-CM | POA: Insufficient documentation

## 2011-12-19 DIAGNOSIS — K219 Gastro-esophageal reflux disease without esophagitis: Secondary | ICD-10-CM | POA: Diagnosis not present

## 2011-12-19 DIAGNOSIS — Z043 Encounter for examination and observation following other accident: Secondary | ICD-10-CM | POA: Diagnosis not present

## 2011-12-19 DIAGNOSIS — Z6832 Body mass index (BMI) 32.0-32.9, adult: Secondary | ICD-10-CM | POA: Diagnosis not present

## 2011-12-24 ENCOUNTER — Encounter (HOSPITAL_COMMUNITY): Payer: Self-pay | Admitting: Psychiatry

## 2011-12-24 ENCOUNTER — Ambulatory Visit (INDEPENDENT_AMBULATORY_CARE_PROVIDER_SITE_OTHER): Payer: Medicare Other | Admitting: Psychiatry

## 2011-12-24 DIAGNOSIS — F323 Major depressive disorder, single episode, severe with psychotic features: Secondary | ICD-10-CM

## 2011-12-24 DIAGNOSIS — K219 Gastro-esophageal reflux disease without esophagitis: Secondary | ICD-10-CM | POA: Diagnosis not present

## 2011-12-24 DIAGNOSIS — F331 Major depressive disorder, recurrent, moderate: Secondary | ICD-10-CM

## 2011-12-24 DIAGNOSIS — R109 Unspecified abdominal pain: Secondary | ICD-10-CM | POA: Diagnosis not present

## 2011-12-24 DIAGNOSIS — F411 Generalized anxiety disorder: Secondary | ICD-10-CM

## 2011-12-24 MED ORDER — PAROXETINE HCL 40 MG PO TABS: 40.0000 mg | ORAL_TABLET | ORAL | Status: DC

## 2011-12-24 MED ORDER — LAMOTRIGINE 25 MG PO TABS: ORAL_TABLET | ORAL | Status: DC

## 2011-12-24 MED ORDER — LORAZEPAM 0.5 MG PO TABS: 0.5000 mg | ORAL_TABLET | Freq: Four times a day (QID) | ORAL | Status: DC | PRN

## 2011-12-24 NOTE — Progress Notes (Signed)
Chief complaint I have nausea .  I feel more depressed.      History of presenting illness Patient is 71 year old Caucasian female who came with her sister for her followup appointment. Patient complained of persistent nausea for past few weeks.  She was seen by her primary care physician who recommended to see GI specialist.  She has colonoscopy and her esophagus was stretched .  However as per patient they could not find anything and colonoscopy.  She has complete blood work 10 days ago which shows creatinine 1.31 and glucose 126.  Patient is started to feel more depressed isolated and withdrawn.  She appears tired and difficulty concentrating in conversation.  Sister endorse that she has been very weak due to persistent nausea.  It is unclear what is causing nausea.  She's also taking multiple medication for pain .  She denies any active or passive suicidal thoughts or homicidal thoughts but endorse at boot and withdrawn.  She denies any feeling of hopelessness or helplessness and wants to get better.  She's not drinking or using any illegal substance.  Current psychiatric medication BuSpar 5 mg twice a day  lorazepam 0.5 mg 4 times a day Paxil 40 mg daily  Past psychiatric history Patient has been seeing psychiatrist since 34 in this office. She is diagnosed with major depressive disorder. She has been stable on lithium and Paxil for many years. Her last psychiatric inpatient treatment was in 90s. She admitted history of mania, depression and psychosis.  Medical history Patient has history of arthritis blood pressure and chronic pain. Her primary care physician is Dr. Phillips Odor at [facility].  She takes medication for her blood pressure and arthritis.  She had history of jerking movements, fall and generalized pain.  She was recently seen in emergency department due to dehydration.  She is a colonoscopy which patient reported within normal limits.  Mental status  examination Patient is casually dressed and fairly groomed.  She uses walker for walking.  She is tired .  She maintained fair eye contact.  She described her mood is sad and depressed.  Her affect is constricted.  She's cooperative in conversation.  He denies any active or passive suicidal thoughts or homicidal thoughts.  Her speech is slow but coherent.  There were no flight of idea or loose association.  There were no obsession or delusions.  She has some tremors which are chronic.  She's alert and oriented x3.  Her attention and concentration is fair.  Her insight judgment and pulse control is okay.  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  Blood was drawn on 12/13/2011.  I review her collateral formation.  Patient was doing much better with lithium however due to increased creatinine it has been discontinued.  I recommend to try Lamictal 25 mg starting dose for 2 weeks and gradually go to 50 mg.  We will discontinue BuSpar .  I explained the risk and benefits of medication especially rash in that case she need to stop the medication immediately.  I will continue Paxil at present does.  I recommend to call us if she is any question or concern if she feels worsening of the symptoms.  Time spent 30 minutes.  I will see her again in 4 weeks.  Portion of this note is generated with voice recognition software and may contain typographical error.

## 2011-12-30 ENCOUNTER — Emergency Department (HOSPITAL_COMMUNITY)
Admission: EM | Admit: 2011-12-30 | Discharge: 2011-12-31 | Payer: Medicare Other | Attending: Emergency Medicine | Admitting: Emergency Medicine

## 2011-12-30 ENCOUNTER — Encounter (HOSPITAL_COMMUNITY): Payer: Self-pay | Admitting: *Deleted

## 2011-12-30 ENCOUNTER — Telehealth (HOSPITAL_COMMUNITY): Payer: Self-pay | Admitting: *Deleted

## 2011-12-30 ENCOUNTER — Emergency Department (HOSPITAL_COMMUNITY): Payer: Medicare Other

## 2011-12-30 DIAGNOSIS — F331 Major depressive disorder, recurrent, moderate: Secondary | ICD-10-CM | POA: Diagnosis not present

## 2011-12-30 DIAGNOSIS — R079 Chest pain, unspecified: Secondary | ICD-10-CM | POA: Diagnosis not present

## 2011-12-30 DIAGNOSIS — R109 Unspecified abdominal pain: Secondary | ICD-10-CM | POA: Diagnosis not present

## 2011-12-30 LAB — CBC WITH DIFFERENTIAL/PLATELET
Eosinophils Absolute: 0.1 10*3/uL (ref 0.0–0.7)
Eosinophils Relative: 1 % (ref 0–5)
HCT: 42.9 % (ref 36.0–46.0)
Lymphocytes Relative: 15 % (ref 12–46)
Lymphs Abs: 1 10*3/uL (ref 0.7–4.0)
MCH: 30.1 pg (ref 26.0–34.0)
MCV: 92.3 fL (ref 78.0–100.0)
Monocytes Absolute: 0.4 10*3/uL (ref 0.1–1.0)
RBC: 4.65 MIL/uL (ref 3.87–5.11)
RDW: 12.8 % (ref 11.5–15.5)
WBC: 7 10*3/uL (ref 4.0–10.5)

## 2011-12-30 LAB — BASIC METABOLIC PANEL
BUN: 16 mg/dL (ref 6–23)
CO2: 26 mEq/L (ref 19–32)
Calcium: 10.8 mg/dL — ABNORMAL HIGH (ref 8.4–10.5)
Creatinine, Ser: 1.21 mg/dL — ABNORMAL HIGH (ref 0.50–1.10)
Glucose, Bld: 92 mg/dL (ref 70–99)

## 2011-12-30 LAB — RAPID URINE DRUG SCREEN, HOSP PERFORMED
Amphetamines: NOT DETECTED
Barbiturates: NOT DETECTED
Tetrahydrocannabinol: NOT DETECTED

## 2011-12-30 MED ORDER — LORAZEPAM 1 MG PO TABS
1.0000 mg | ORAL_TABLET | Freq: Once | ORAL | Status: AC
  Administered 2011-12-30: 1 mg via ORAL
  Filled 2011-12-30: qty 1

## 2011-12-30 NOTE — BH Assessment (Addendum)
Assessment Note   Amy Clay is an 71 y.o. female.   PT WAS SENT TO THE ER BY HER PSYCHIATRIST DUE TO FAILURE TO THRIVE AND FLEETING SUICIDAL THOUGHTS.  PT CONTINUES TO REPEAT SHE WANTS TO GO HOME AND TODAY SAID SHE WAS GOING UPSTAIRS IN HER HOME TO BE ABLE TO GO HOME. WHEN FAMILY MEMBERS ASKED HER WAS SHE PLANNING TO DO SOMETHING TO HURT HERSELF PT REPLIED YES.  PT DENIES H/I.   PT IS FIXATED FEELINGS OF NAUSEA AND ALL WORK-UPS REVEAL NO MEDICAL PROBLEMS RELATED TO GASTRIC PROBLEMS.  PT HAS FAILED TO EAT AS SHE SHOULD, IS BECOMING AGITATED AND HAS GIVEN UP ON LIFE. PT HAS NO SELF-WORTH .  PT DENIES DRUG OR ALCOHOL USE. PT WAS COOPERATIVE DURING THE ASSESSMENT AND AGREES TO SIGN HERSELF IN FOR HELP. PT IS ALERT AND ORIENTED AND HER COGNITIVE THINKING ALLOWS HER TO DO SO.         Axis I: Major Depression, Recurrent severe Axis II: Deferred Axis III:  Past Medical History  Diagnosis Date  . High blood pressure   . Arthritis   . Depression   . Anxiety   . Hemorrhoids   . Hyperlipidemia   . Bipolar 1 disorder    Axis IV: other psychosocial or environmental problems and problems with access to health care services Axis V: 11-20 some danger of hurting self or others possible OR occasionally fails to maintain minimal personal hygiene OR gross impairment in communication        Past Medical History:  Past Medical History  Diagnosis Date  . High blood pressure   . Arthritis   . Depression   . Anxiety   . Hemorrhoids   . Hyperlipidemia   . Bipolar 1 disorder     Past Surgical History  Procedure Date  . Back surgery 03/16/87  . Cholecystectomy 08/15/93  . Partial hysterectomy 04/04/95  . Bladder tacked 04/04/95  . Ankle surgery 04/28/97    right ankle fracture  . Wrist surgery 02/24/09    right wrist fracture  . Colonoscopy 10/26/2002    Dr. Karilyn Cota- small external hemorrhoids otherwise normal   . Breast biopsy 05/16/95    right side  . Savory dilation 10/31/2011   Procedure: SAVORY DILATION;  Surgeon: Corbin Ade, MD;  Location[facility];  Service: Endoscopy;  Laterality: N/A;  Elease Hashimoto dilation 10/31/2011    Procedure: Elease Hashimoto DILATION;  Surgeon: Corbin Ade, MD;  Location: [facility];  Service: Endoscopy;  Laterality: N/A;  . Abdominal hysterectomy     Family History:  Family History  Problem Relation Age of Onset  . Arthritis    . Asthma    . Diabetes    . Colon cancer Neg Hx     Social History:  reports that she has never smoked. She does not have any smokeless tobacco history on file. She reports that she does not drink alcohol or use illicit drugs.  Additional Social History:  Alcohol / Drug Use Pain Medications: na Prescriptions: na Over the Counter: na History of alcohol / drug use?: No history of alcohol / drug abuse  CIWA: CIWA-Ar BP: 159/80 mmHg Pulse Rate: 69  COWS:    Allergies:  Allergies  Allergen Reactions  . Cephalexin Rash and Other (See Comments)    Blistering of the mouth  . Penicillins Rash    Home Medications:  (Not in a hospital admission)  OB/GYN Status:  No LMP recorded. Patient has had  a hysterectomy.  General Assessment Data Location of Assessment: [facility] ACT Assessment: Yes Living Arrangements: Other relatives (sisters) Can pt return to current living arrangement?: Yes Admission Status: Voluntary Is patient capable of signing voluntary admission?: Yes Transfer from: Acute Hospital Referral Source: MD (DR Donnetta Hutching)  Education Status Contact person: KAY Magee-[phone]  Risk to self Suicidal Ideation: Yes-Currently Present Suicidal Intent: Yes-Currently Present Is patient at risk for suicide?: Yes Suicidal Plan?: No Access to Means: No What has been your use of drugs/alcohol within the last 12 months?: NA Previous Attempts/Gestures: No How many times?: 0  Triggers for Past Attempts: None known Intentional Self Injurious Behavior: None Family Suicide History:  No Recent stressful life event(s): Recent negative physical changes (NAUSEA/VOMITING X 6 MONTHS OFF AND ON) Persecutory voices/beliefs?: No Depression: Yes Depression Symptoms: Despondent;Loss of interest in usual pleasures;Feeling worthless/self pity;Feeling angry/irritable Substance abuse history and/or treatment for substance abuse?: No Suicide prevention information given to non-admitted patients: Not applicable  Risk to Others Homicidal Ideation: No Thoughts of Harm to Others: No Current Homicidal Intent: No Current Homicidal Plan: No Access to Homicidal Means: No History of harm to others?: No Assessment of Violence: None Noted Does patient have access to weapons?: No Criminal Charges Pending?: No Does patient have a court date: No  Psychosis Hallucinations: None noted Delusions: Somatic (THINKS SOMETHING IS MEDICALLY WRONG WITH STOMACH--NO FINDING)  Mental Status Report Appear/Hygiene: Improved Eye Contact: Good Motor Activity: Psychomotor retardation;Gait exaggerated;Unsteady Speech: Logical/coherent Level of Consciousness: Alert Mood: Depressed;Labile;Despair;Helpless;Fearful;Irritable;Worthless, low self-esteem Affect: Appropriate to circumstance;Depressed;Sad Anxiety Level: Minimal Thought Processes: Coherent;Relevant Judgement: Impaired Orientation: Person;Place;Time;Situation Obsessive Compulsive Thoughts/Behaviors: Moderate (ABOUT HEALTH AND OTHER THINGS)  Cognitive Functioning Concentration: Decreased Memory: Recent Impaired;Remote Impaired IQ: Average Insight: Poor Impulse Control: Poor Appetite: Poor Sleep: Decreased Total Hours of Sleep: 6  (NO SLEEP LAST NIGHT) Vegetative Symptoms: Staying in bed;Decreased grooming  ADLScreening [facility] Assessment Services) Patient's cognitive ability adequate to safely complete daily activities?: No Patient able to express need for assistance with ADLs?: Yes Independently performs ADLs?: No  Abuse/Neglect  [facility]) Physical Abuse: Denies Verbal Abuse: Denies Sexual Abuse: Denies  Prior Inpatient Therapy Prior Inpatient Therapy: Yes Prior Therapy Dates: 1990 Prior Therapy Facilty/Provider(s): [facility] Reason for Treatment: DEPRESSION  Prior Outpatient Therapy Prior Outpatient Therapy: Yes Prior Therapy Dates: CURRENTLY Prior Therapy Facilty/Provider(s): [facility] [address] [phone] Reason for Treatment: DEPRESSION  ADL Screening (condition at time of admission) Patient's cognitive ability adequate to safely complete daily activities?: No Patient able to express need for assistance with ADLs?: Yes Independently performs ADLs?: No Communication: Independent Dressing (OT): Needs assistance Grooming: Needs assistance Feeding: Independent Bathing: Needs assistance Is this a change from baseline?: Pre-admission baseline Toileting: Needs assistance In/Out Bed: Needs assistance Walks in Home: Independent with device (comment) Weakness of Legs: None Weakness of Arms/Hands: None  Home Assistive Devices/Equipment Home Assistive Devices/Equipment: Bedside commode/3-in-1;Walker (specify type)  Therapy Consults (therapy consults require a physician order) PT Evaluation Needed: No OT Evalulation Needed: No SLP Evaluation Needed: No Abuse/Neglect Assessment (Assessment to be complete while patient is alone) Physical Abuse: Denies Verbal Abuse: Denies Sexual Abuse: Denies Exploitation of patient/patient's resources: Denies Self-Neglect: Denies Values / Beliefs Cultural Requests During Hospitalization: None Spiritual Requests During Hospitalization: None Consults Spiritual Care Consult Needed: No Social Work Consult Needed: No Merchant navy officer (For Healthcare) Advance Directive: Patient does not have advance directive;Patient would not like information Pre-existing out of facility DNR order (yellow form or pink MOST form): No    Additional Information 1:1  In Past 12  Months?: No CIRT Risk: No Elopement Risk: No Does patient have medical clearance?: Yes     Disposition: REFERRED TO [facility]. PATIENT HAS BEEN ACCEPTED TO [facility]. HAD PATIENT SIGN VOLUNTARY ADMISSION FORM AS REQUESTED AND FAXED TO [place]. PATIENT WILL BE TRANSFERRED LATER THIS AFTERNOON WHEN BED IS AVAILABLE. DR Jeanell Sparrow IN AGREEMENT WITH THIS DISPOSITION. Disposition Disposition of Patient: Inpatient treatment program Type of inpatient treatment program: Adult (REFERRED TO [facility])  On Site Evaluation by:  DR Donnetta Hutching                                                                                    Reviewed with Physician:     Hattie Perch Winford 12/30/2011 9:37 PM

## 2011-12-30 NOTE — ED Notes (Signed)
Walked with patient and Regulatory affairs officer to xray to have chest xray performed. Patient now back in room and being assessed by Samson Frederic at this time.

## 2011-12-30 NOTE — Telephone Encounter (Signed)
Recommend to bring patient to the emergency room and [facility] for assessment.

## 2011-12-30 NOTE — ED Notes (Addendum)
Says she is suicidal, has been seeing psychiatrist and recent change in meds with no relief.  Pt c/o nausea, and was recently seen for abd pain.

## 2011-12-30 NOTE — ED Provider Notes (Signed)
History  This chart was scribed for Donnetta Hutching, MD by Erskine Emery. This patient was seen in room APA16A/APA16A and the patient's care was started at 17:59.   CSN: [id number]  Arrival date & time 12/30/11  1647   First MD Initiated Contact with Patient 12/30/11 1759      Chief Complaint  Patient presents with  . Medical Clearance    (Consider location/radiation/quality/duration/timing/severity/associated sxs/prior treatment) The history is provided by the patient and a relative. No language interpreter was used.  Amy Clay is a 71 y.o. female who presents to the Emergency Department complaining of abdominal pain, loss of appetite, and a "want to leave this world." Pt lives with her sister who reports she has a h/o nervous break downs and lately hasn't been sleeping, isn't eating, isn't interested in anything, and is confused. Pt sees a psychiatrist at [facility] in [place]; she saw him last Tuesday, told him about her abdominal pain, and he changed her medication.  Pt's sister reports she was admitted to [facility] twice in 1997 after their mother was put in a nursing home and she "couldn't handle it." The pt was admitted again elsewhere years later.  Dr Phillips Odor is the pt's PCP who recently sent her to get a colonoscopy that came back normal.   Past Medical History  Diagnosis Date  . High blood pressure   . Arthritis   . Depression   . Anxiety   . Hemorrhoids   . Hyperlipidemia   . Bipolar 1 disorder     Past Surgical History  Procedure Date  . Back surgery 03/16/87  . Cholecystectomy 08/15/93  . Partial hysterectomy 04/04/95  . Bladder tacked 04/04/95  . Ankle surgery 04/28/97    right ankle fracture  . Wrist surgery 02/24/09    right wrist fracture  . Colonoscopy 10/26/2002    Dr. Karilyn Cota- small external hemorrhoids otherwise normal   . Breast biopsy 05/16/95    right side  . Savory dilation 10/31/2011    Procedure: SAVORY DILATION;  Surgeon: Corbin Ade,  MD;  [facility];  Service: Endoscopy;  Laterality: N/A;  Elease Hashimoto dilation 10/31/2011    Procedure: Elease Hashimoto DILATION;  Surgeon: Corbin Ade, MD;  [facility];  Service: Endoscopy;  Laterality: N/A;  . Abdominal hysterectomy     Family History  Problem Relation Age of Onset  . Arthritis    . Asthma    . Diabetes    . Colon cancer Neg Hx     History  Substance Use Topics  . Smoking status: Never Smoker   . Smokeless tobacco: Not on file  . Alcohol Use: No    OB History    Grav Para Term Preterm Abortions TAB SAB Ect Mult Living                  Review of Systems  Constitutional: Positive for activity change and appetite change. Negative for fever and chills.  Respiratory: Negative for shortness of breath.   Gastrointestinal: Positive for nausea and abdominal pain. Negative for vomiting.  Neurological: Negative for weakness.  Psychiatric/Behavioral: Positive for suicidal ideas, confusion and disturbed wake/sleep cycle. Negative for self-injury.  All other systems reviewed and are negative.    Allergies  Cephalexin and Penicillins  Home Medications   Current Outpatient Rx  Name Route Sig Dispense Refill  . ASPIRIN EC 81 MG PO TBEC Oral Take 81 mg by mouth daily.    Idolina Primer  PO TABS Oral Take 1 tablet by mouth daily.    Marland Kitchen CALCIUM CARBONATE 600 MG PO TABS Oral Take 600 mg by mouth 2 (two) times daily with a meal.     . VITAMIN D 2000 UNITS PO CAPS Oral Take 1 capsule by mouth daily.    Marland Kitchen ESTRACE 0.1 MG/GM VA CREA Vaginal Place 2 g vaginally 2 (two) times a week.     Marland Kitchen HYDROCODONE-ACETAMINOPHEN 5-500 MG PO TABS      . LAMOTRIGINE 25 MG PO TABS  Take 1 tab for 2 week and than 2 tab daily 60 tablet 0  . LORAZEPAM 0.5 MG PO TABS Oral Take 1 tablet (0.5 mg total) by mouth 4 (four) times daily as needed for anxiety. 120 tablet 0  . LOSARTAN POTASSIUM 100 MG PO TABS Oral Take 100 mg by mouth every morning.     Marland Kitchen MELOXICAM 15 MG PO TABS Oral Take 15  mg by mouth daily.     Marland Kitchen OMEPRAZOLE 20 MG PO CPDR Oral Take 20 mg by mouth daily.     Marland Kitchen ONDANSETRON HCL 4 MG PO TABS Oral Take 4 mg by mouth every 8 (eight) hours as needed. nausea    . OXYCODONE-ACETAMINOPHEN 5-325 MG PO TABS Oral Take 1 tablet by mouth every 6 (six) hours as needed for pain. 20 tablet 0  . PAROXETINE HCL 40 MG PO TABS Oral Take 1 tablet (40 mg total) by mouth every morning. 30 tablet 0  . PEG-KCL-NACL-NASULF-NA ASC-C 100 G PO SOLR Oral Take 1 kit (100 g total) by mouth once. As directed Please purchase 1 Fleets enema to use with the prep 1 kit 0  . SOLIFENACIN SUCCINATE 10 MG PO TABS Oral Take 10 mg by mouth every morning.     Marland Kitchen TERAZOSIN HCL 1 MG PO CAPS Oral Take 1 mg by mouth at bedtime. Take with the 2 mg capsule at bedtime    . TERAZOSIN HCL 2 MG PO CAPS Oral Take 2 mg by mouth at bedtime. Take with the 1 mg capsule at bedtime    . VITAMIN C 250 MG PO TABS Oral Take 250 mg by mouth daily.      Triage Vitals: BP 151/65  Pulse 64  Temp 97.6 F (36.4 C) (Oral)  Resp 20  Ht 5\' 6"  (1.676 m)  Wt 196 lb (88.905 kg)  BMI 31.64 kg/m2  SpO2 100%  Physical Exam  Nursing note and vitals reviewed. Constitutional: She is oriented to person, place, and time.       Obese  HENT:  Head: Normocephalic and atraumatic.  Eyes: Conjunctivae and EOM are normal.  Neck: Normal range of motion. Neck supple.  Cardiovascular: Normal rate and regular rhythm.   Pulmonary/Chest: Effort normal and breath sounds normal.  Abdominal: Soft. Bowel sounds are normal.  Musculoskeletal: Normal range of motion.  Neurological: She is oriented to person, place, and time.  Skin: Skin is warm and dry. She is not diaphoretic.  Psychiatric:       Flat affect, sad, speaks in low voice.    ED Course  Procedures (including critical care time) DIAGNOSTIC STUDIES: Oxygen Saturation is 100% on room air, normal by my interpretation.    COORDINATION OF CARE: 19:13--I evaluated the patient and we  discussed a treatment plan including blood work and psychiatric consults to which the pt and her sister agreed.    Results for orders placed during the hospital encounter of 12/30/11  CBC WITH DIFFERENTIAL  Component Value Range   WBC 7.0  4.0 - 10.5 K/uL   RBC 4.65  3.87 - 5.11 MIL/uL   Hemoglobin 14.0  12.0 - 15.0 g/dL   HCT 16.1  09.6 - 04.5 %   MCV 92.3  78.0 - 100.0 fL   MCH 30.1  26.0 - 34.0 pg   MCHC 32.6  30.0 - 36.0 g/dL   RDW 40.9  81.1 - 91.4 %   Platelets 219  150 - 400 K/uL   Neutrophils Relative 78 (*) 43 - 77 %   Neutro Abs 5.5  1.7 - 7.7 K/uL   Lymphocytes Relative 15  12 - 46 %   Lymphs Abs 1.0  0.7 - 4.0 K/uL   Monocytes Relative 6  3 - 12 %   Monocytes Absolute 0.4  0.1 - 1.0 K/uL   Eosinophils Relative 1  0 - 5 %   Eosinophils Absolute 0.1  0.0 - 0.7 K/uL   Basophils Relative 0  0 - 1 %   Basophils Absolute 0.0  0.0 - 0.1 K/uL  BASIC METABOLIC PANEL      Component Value Range   Sodium 139  135 - 145 mEq/L   Potassium 3.7  3.5 - 5.1 mEq/L   Chloride 103  96 - 112 mEq/L   CO2 26  19 - 32 mEq/L   Glucose, Bld 92  70 - 99 mg/dL   BUN 16  6 - 23 mg/dL   Creatinine, Ser 7.82 (*) 0.50 - 1.10 mg/dL   Calcium 95.6 (*) 8.4 - 10.5 mg/dL   GFR calc non Af Amer 44 (*) >90 mL/min   GFR calc Af Amer 51 (*) >90 mL/min  ETHANOL      Component Value Range   Alcohol, Ethyl (B) <11  0 - 11 mg/dL  URINE RAPID DRUG SCREEN (HOSP PERFORMED)      Component Value Range   Opiates NONE DETECTED  NONE DETECTED   Cocaine NONE DETECTED  NONE DETECTED   Benzodiazepines NONE DETECTED  NONE DETECTED   Amphetamines NONE DETECTED  NONE DETECTED   Tetrahydrocannabinol NONE DETECTED  NONE DETECTED   Barbiturates NONE DETECTED  NONE DETECTED   Dg Chest 2 View  12/30/2011  *RADIOLOGY REPORT*  Clinical Data: Chest pain  CHEST - 2 VIEW  Comparison: 09/15/2011  Findings: Spurring in the lower thoracic spine.  Vascular clips in the right upper abdomen. Lungs clear.  Heart size and  pulmonary vascularity normal.  No effusion.  IMPRESSION: No acute disease   Original Report Authenticated By: Osa Craver, M.D.       No diagnosis found.   Date: 12/30/2011  Rate: 71  Rhythm: normal sinus rhythm  QRS Axis: normal  Intervals: normal  ST/T Wave abnormalities: normal  Conduction Disutrbances: none  Narrative Interpretation: unremarkable     MDM  Patient medically cleared for abdominal pain.  Will get behavioral health consult for depression      I personally performed the services described in this documentation, which was scribed in my presence. The recorded information has been reviewed and considered.    Donnetta Hutching, MD 01/11/12 [phone]

## 2011-12-31 ENCOUNTER — Encounter (HOSPITAL_COMMUNITY): Payer: Self-pay | Admitting: *Deleted

## 2011-12-31 DIAGNOSIS — F315 Bipolar disorder, current episode depressed, severe, with psychotic features: Secondary | ICD-10-CM | POA: Diagnosis not present

## 2011-12-31 DIAGNOSIS — R079 Chest pain, unspecified: Secondary | ICD-10-CM | POA: Diagnosis not present

## 2011-12-31 DIAGNOSIS — R627 Adult failure to thrive: Secondary | ICD-10-CM | POA: Diagnosis present

## 2011-12-31 DIAGNOSIS — F411 Generalized anxiety disorder: Secondary | ICD-10-CM | POA: Diagnosis present

## 2011-12-31 DIAGNOSIS — F319 Bipolar disorder, unspecified: Secondary | ICD-10-CM | POA: Diagnosis not present

## 2011-12-31 DIAGNOSIS — I10 Essential (primary) hypertension: Secondary | ICD-10-CM | POA: Diagnosis present

## 2011-12-31 DIAGNOSIS — R197 Diarrhea, unspecified: Secondary | ICD-10-CM | POA: Diagnosis present

## 2011-12-31 DIAGNOSIS — K649 Unspecified hemorrhoids: Secondary | ICD-10-CM | POA: Diagnosis present

## 2011-12-31 DIAGNOSIS — E785 Hyperlipidemia, unspecified: Secondary | ICD-10-CM | POA: Diagnosis present

## 2011-12-31 DIAGNOSIS — F29 Unspecified psychosis not due to a substance or known physiological condition: Secondary | ICD-10-CM | POA: Diagnosis not present

## 2011-12-31 DIAGNOSIS — F312 Bipolar disorder, current episode manic severe with psychotic features: Secondary | ICD-10-CM | POA: Diagnosis not present

## 2011-12-31 DIAGNOSIS — Z880 Allergy status to penicillin: Secondary | ICD-10-CM | POA: Diagnosis not present

## 2011-12-31 DIAGNOSIS — Z79899 Other long term (current) drug therapy: Secondary | ICD-10-CM | POA: Diagnosis not present

## 2011-12-31 DIAGNOSIS — F331 Major depressive disorder, recurrent, moderate: Secondary | ICD-10-CM | POA: Diagnosis not present

## 2011-12-31 DIAGNOSIS — E669 Obesity, unspecified: Secondary | ICD-10-CM | POA: Diagnosis present

## 2011-12-31 DIAGNOSIS — K219 Gastro-esophageal reflux disease without esophagitis: Secondary | ICD-10-CM | POA: Diagnosis present

## 2011-12-31 DIAGNOSIS — M129 Arthropathy, unspecified: Secondary | ICD-10-CM | POA: Diagnosis present

## 2011-12-31 DIAGNOSIS — K449 Diaphragmatic hernia without obstruction or gangrene: Secondary | ICD-10-CM | POA: Diagnosis present

## 2011-12-31 MED ORDER — ONDANSETRON HCL 4 MG PO TABS: 4.0000 mg | ORAL_TABLET | Freq: Three times a day (TID) | ORAL | Status: DC | PRN

## 2011-12-31 MED ORDER — ZOLPIDEM TARTRATE 5 MG PO TABS
5.0000 mg | ORAL_TABLET | Freq: Every evening | ORAL | Status: DC | PRN
  Administered 2011-12-31: 5 mg via ORAL
  Filled 2011-12-31: qty 1

## 2011-12-31 MED ORDER — PANTOPRAZOLE SODIUM 40 MG PO TBEC
40.0000 mg | DELAYED_RELEASE_TABLET | Freq: Every day | ORAL | Status: DC
  Administered 2011-12-31: 40 mg via ORAL
  Filled 2011-12-31: qty 1

## 2011-12-31 MED ORDER — VITAMIN D3 25 MCG (1000 UNIT) PO TABS
2000.0000 [IU] | ORAL_TABLET | Freq: Every day | ORAL | Status: DC
  Administered 2011-12-31: 2000 [IU] via ORAL
  Filled 2011-12-31 (×2): qty 2

## 2011-12-31 MED ORDER — ASPIRIN EC 81 MG PO TBEC
81.0000 mg | DELAYED_RELEASE_TABLET | Freq: Every day | ORAL | Status: DC
  Administered 2011-12-31: 81 mg via ORAL
  Filled 2011-12-31 (×2): qty 1

## 2011-12-31 MED ORDER — DARIFENACIN HYDROBROMIDE ER 7.5 MG PO TB24
7.5000 mg | ORAL_TABLET | Freq: Every day | ORAL | Status: DC
  Administered 2011-12-31: 7.5 mg via ORAL
  Filled 2011-12-31 (×2): qty 1

## 2011-12-31 MED ORDER — VITAMIN D 50 MCG (2000 UT) PO CAPS: 1.0000 | ORAL_CAPSULE | ORAL | Status: DC

## 2011-12-31 MED ORDER — LORAZEPAM 1 MG PO TABS: 1.0000 mg | ORAL_TABLET | Freq: Three times a day (TID) | ORAL | Status: DC | PRN

## 2011-12-31 MED ORDER — TERAZOSIN HCL 1 MG PO CAPS
1.0000 mg | ORAL_CAPSULE | Freq: Every day | ORAL | Status: DC
  Filled 2011-12-31 (×2): qty 1

## 2011-12-31 MED ORDER — SUCRALFATE 1 G PO TABS
1.0000 g | ORAL_TABLET | Freq: Four times a day (QID) | ORAL | Status: DC
  Administered 2011-12-31: 1 g via ORAL
  Filled 2011-12-31 (×6): qty 1

## 2011-12-31 MED ORDER — VITAMIN C 500 MG PO TABS
250.0000 mg | ORAL_TABLET | Freq: Every day | ORAL | Status: DC
  Administered 2011-12-31: 250 mg via ORAL
  Filled 2011-12-31 (×2): qty 1

## 2011-12-31 MED ORDER — LAMOTRIGINE 100 MG PO TABS
100.0000 mg | ORAL_TABLET | Freq: Every day | ORAL | Status: DC
  Administered 2011-12-31: 100 mg via ORAL
  Filled 2011-12-31 (×2): qty 1

## 2011-12-31 MED ORDER — DOCUSATE SODIUM 100 MG PO CAPS
200.0000 mg | ORAL_CAPSULE | Freq: Every day | ORAL | Status: DC
  Filled 2011-12-31: qty 2

## 2011-12-31 MED ORDER — CALCIUM CARBONATE 600 MG PO TABS
600.0000 mg | ORAL_TABLET | Freq: Two times a day (BID) | ORAL | Status: DC
  Filled 2011-12-31 (×3): qty 1

## 2011-12-31 MED ORDER — ONDANSETRON 4 MG PO TBDP
4.0000 mg | ORAL_TABLET | Freq: Once | ORAL | Status: AC
  Administered 2011-12-31: 4 mg via ORAL

## 2011-12-31 MED ORDER — CALCIUM CARBONATE 1250 (500 CA) MG PO TABS
1.0000 | ORAL_TABLET | Freq: Two times a day (BID) | ORAL | Status: DC
  Administered 2011-12-31: 500 mg via ORAL
  Filled 2011-12-31 (×3): qty 1

## 2011-12-31 MED ORDER — ACETAMINOPHEN 325 MG PO TABS: 650.0000 mg | ORAL_TABLET | ORAL | Status: DC | PRN

## 2011-12-31 MED ORDER — LOSARTAN POTASSIUM 50 MG PO TABS
100.0000 mg | ORAL_TABLET | Freq: Every morning | ORAL | Status: DC
  Administered 2011-12-31: 100 mg via ORAL
  Filled 2011-12-31 (×2): qty 2

## 2011-12-31 MED ORDER — PAROXETINE HCL 20 MG PO TABS
40.0000 mg | ORAL_TABLET | Freq: Every day | ORAL | Status: DC
  Administered 2011-12-31: 40 mg via ORAL
  Filled 2011-12-31 (×2): qty 2

## 2011-12-31 MED ORDER — ONDANSETRON 4 MG PO TBDP
ORAL_TABLET | ORAL | Status: AC
  Filled 2011-12-31: qty 1

## 2011-12-31 NOTE — ED Notes (Signed)
Patient transferred to

## 2011-12-31 NOTE — ED Notes (Signed)
Pt acts like she don't want to move, pt tries to be very dependant on staff.

## 2011-12-31 NOTE — ED Notes (Signed)
Offered coke/crackers. Pt declined

## 2011-12-31 NOTE — ED Notes (Signed)
[facility] called and has a bed available at this time

## 2011-12-31 NOTE — BHH Counselor (Signed)
Received call from [facility]. The patient has been accepted as a voluntary admiss. She will be transferred this afternoon when the bed is available. Received fax from [place] that included the voluntary admission form. Form signed and returned to admissions. Support paperwork compiled and ready for transfer.

## 2011-12-31 NOTE — ED Notes (Signed)
Per Nicholos Johns at [facility] --pt has been accepted, but cannot come until this afternoon, she requested that we call around 1pm today to check if beds are open, [phone]

## 2011-12-31 NOTE — ED Notes (Signed)
Pt returned to bed, voided in bsc.

## 2011-12-31 NOTE — ED Notes (Signed)
Pt sitting up in recliner, multiple friends at bedside. Sitter at bedside.

## 2011-12-31 NOTE — ED Provider Notes (Signed)
5:45 PM patient alert ambulates with walker without difficulty. Pleasant and cooperative. Stable for transfer to psychiatric facility  Doug Sou, MD 12/31/11 1747

## 2011-12-31 NOTE — ED Notes (Signed)
Pt completed am care. Washed self in bed.

## 2011-12-31 NOTE — ED Notes (Signed)
Research scientist (physical sciences) in site

## 2012-01-23 ENCOUNTER — Ambulatory Visit (INDEPENDENT_AMBULATORY_CARE_PROVIDER_SITE_OTHER): Payer: Medicare Other | Admitting: Psychiatry

## 2012-01-23 ENCOUNTER — Encounter (HOSPITAL_COMMUNITY): Payer: Self-pay | Admitting: Psychiatry

## 2012-01-23 VITALS — BP 129/79 | HR 69 | Wt 200.0 lb

## 2012-01-23 DIAGNOSIS — F411 Generalized anxiety disorder: Secondary | ICD-10-CM

## 2012-01-23 DIAGNOSIS — F331 Major depressive disorder, recurrent, moderate: Secondary | ICD-10-CM

## 2012-01-23 DIAGNOSIS — F09 Unspecified mental disorder due to known physiological condition: Secondary | ICD-10-CM

## 2012-01-23 DIAGNOSIS — F333 Major depressive disorder, recurrent, severe with psychotic symptoms: Secondary | ICD-10-CM

## 2012-01-23 MED ORDER — PAROXETINE HCL 40 MG PO TABS: 40.0000 mg | ORAL_TABLET | ORAL | Status: DC

## 2012-01-23 MED ORDER — BENZTROPINE MESYLATE 0.5 MG PO TABS: 0.5000 mg | ORAL_TABLET | Freq: Every day | ORAL | Status: DC

## 2012-01-23 MED ORDER — LAMOTRIGINE 25 MG PO TABS: ORAL_TABLET | ORAL | Status: DC

## 2012-01-23 MED ORDER — RISPERIDONE 0.5 MG PO TABS: ORAL_TABLET | ORAL | Status: DC

## 2012-01-23 MED ORDER — PAROXETINE HCL 20 MG PO TABS: 20.0000 mg | ORAL_TABLET | ORAL | Status: DC

## 2012-01-23 NOTE — Progress Notes (Signed)
Chief complaint I was admitted at [facility] for 2 weeks for my depression.  I'm doing better.  My medicine changed.    History of presenting illness Patient is 71 year old Caucasian female who came with her sister for her followup appointment.  Patient was admitted at [facility] on August 20 and release on September 8 .  She was assessed in our emergency department when her sister brought her as patient complaining of increased depression and having suicidal thoughts.  She was not eating and fixated on her nausea.  She wants to join heaven and continued to have suicidal thoughts.  At her hospital stay , Ativan was discontinued.  Her Paxil was increased to 60 mg , her Lamictal was also increased and patient was started on Risperdal.  She is feeling better.  She denies any recent suicidal thoughts.  She is also eating well.  She continues to have anxiety and nervousness but overall she is more social and denies any crying spells.  Sister endorse that patient is still has some anxiety.  Patient denies any agitation anger mood swing.  She is eating better and gained some weight.  She complaint off tremors and shakes with Risperdal.  She denies any itching or any rash with Lamictal.  She denies any hallucination or any paranoid thinking .  She does not feel nauseated anymore.  She has complete blood work in the emergency room.  However we have not received any discharge summary from [facility].  Patient brought discharge medication list which was verified.  Her vitals are stable.  Current psychiatric medication Risperdal 1.5 mg at bedtime Paxil 40 mg daily Paxil 20 mg daily Lamictal 25 mg 2 tablet twice a day  Past psychiatric history Patient was recently admitted at [facility] after having suicidal thoughts.  She denies any suicidal attempt but feeling hopeless helpless and wanted to die.  Patient has been seeing psychiatrist since 40 in  this office. She is diagnosed with major depressive disorder. She she was very stable on lithium until her creatinine went up and lithium was discontinued.  We had recently tried Lamictal and the dose has been increased recently.  In the past we had tried BuSpar which was discontinued at hospital.  She has another psychiatric inpatient treatment in 90s. She admitted history of mania, depression and psychosis.  Social history Patient lives with her sister.  She has no children.  Her husband died in nursing home.  Patient has no other family.  Medical history Patient has history of arthritis blood pressure and chronic pain. Her primary care physician is Dr. Phillips Odor at [facility].  She takes medication for her blood pressure and arthritis.  She had history of jerking movements, fall and generalized pain.  She was recently seen in emergency department due to dehydration.  Her last blood work was done on August 19 which shows creatinine 1.21.  Her neutrophil count was 78.  Otherwise her CBC and CMP is normal.  Mental status examination Patient is casually dressed and fairly groomed.  She appears tired .  She described her mood is neutral and her affect is mood appropriate.  Her speech is slow .  She has difficulty reflecting her thoughts .  She has some tremors which could be due to Risperdal.  She uses walker for walking.  She maintained fair eye contact.  She's cooperative in conversation.  He denies any active or passive suicidal thoughts or homicidal thoughts.  Her speech  is slow but coherent.  There were no flight of idea or loose association.  There were no obsession or delusions.  Her attention and memory is distracted.  She has difficulty recalling or evens.  Her fund of knowledge is average.  She's alert and oriented x3.  Her attention and concentration is fair.  Her insight judgment and pulse control is okay.  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  Her medication has been changed one she doesn't [facility].  She is taking Paxil 40 mg and 20 mg.  She is taking Risperdal 1 mg and 0.5 mg.  She is taking Lamictal 25 mg 2 tablet twice a day.  She is feeling better however she continued to feel tired  And her tremor has been increased from the past.  I will add Cogentin 0.5 mg at bedtime.we will further evaluate in the future to cut down her Risperdal if her tremor does not get better.  Patient does not have any itching or rash with Lamictal.  I will discontinue BuSpar and lorazepam .  Patient is not taking these medication.  I recommend to call us if she is any question or concern about the medication if she feels worsening of the symptom.  I will see her again in 3-4 weeks.  Time spent 30 minutes.  Portion of this note is generated with voice recognition software and may contain typographical error.

## 2012-02-12 ENCOUNTER — Observation Stay (HOSPITAL_COMMUNITY)
Admission: EM | Admit: 2012-02-12 | Discharge: 2012-02-14 | Disposition: A | Discharge status: Home / Self Care | Payer: Medicare Other | Attending: Internal Medicine | Admitting: Internal Medicine

## 2012-02-12 ENCOUNTER — Emergency Department (HOSPITAL_COMMUNITY): Payer: Medicare Other

## 2012-02-12 ENCOUNTER — Encounter (HOSPITAL_COMMUNITY): Payer: Self-pay

## 2012-02-12 DIAGNOSIS — F603 Borderline personality disorder: Secondary | ICD-10-CM | POA: Diagnosis not present

## 2012-02-12 DIAGNOSIS — I10 Essential (primary) hypertension: Secondary | ICD-10-CM | POA: Diagnosis not present

## 2012-02-12 DIAGNOSIS — Z23 Encounter for immunization: Secondary | ICD-10-CM | POA: Diagnosis not present

## 2012-02-12 DIAGNOSIS — I959 Hypotension, unspecified: Secondary | ICD-10-CM | POA: Diagnosis not present

## 2012-02-12 DIAGNOSIS — R55 Syncope and collapse: Principal | ICD-10-CM | POA: Insufficient documentation

## 2012-02-12 DIAGNOSIS — M1990 Unspecified osteoarthritis, unspecified site: Secondary | ICD-10-CM

## 2012-02-12 DIAGNOSIS — F331 Major depressive disorder, recurrent, moderate: Secondary | ICD-10-CM | POA: Diagnosis not present

## 2012-02-12 DIAGNOSIS — N390 Urinary tract infection, site not specified: Secondary | ICD-10-CM | POA: Diagnosis not present

## 2012-02-12 DIAGNOSIS — M75101 Unspecified rotator cuff tear or rupture of right shoulder, not specified as traumatic: Secondary | ICD-10-CM

## 2012-02-12 DIAGNOSIS — M129 Arthropathy, unspecified: Secondary | ICD-10-CM | POA: Diagnosis not present

## 2012-02-12 DIAGNOSIS — G319 Degenerative disease of nervous system, unspecified: Secondary | ICD-10-CM | POA: Diagnosis not present

## 2012-02-12 DIAGNOSIS — R404 Transient alteration of awareness: Secondary | ICD-10-CM | POA: Diagnosis not present

## 2012-02-12 DIAGNOSIS — R1013 Epigastric pain: Secondary | ICD-10-CM | POA: Diagnosis not present

## 2012-02-12 LAB — URINALYSIS, ROUTINE W REFLEX MICROSCOPIC
Bilirubin Urine: NEGATIVE
Glucose, UA: NEGATIVE mg/dL
Hgb urine dipstick: NEGATIVE
Ketones, ur: NEGATIVE mg/dL
Nitrite: NEGATIVE
Specific Gravity, Urine: 1.011 (ref 1.005–1.030)
pH: 6.5 (ref 5.0–8.0)

## 2012-02-12 LAB — CBC WITH DIFFERENTIAL/PLATELET
Basophils Absolute: 0 10*3/uL (ref 0.0–0.1)
Basophils Relative: 0 % (ref 0–1)
Eosinophils Absolute: 0.1 10*3/uL (ref 0.0–0.7)
Eosinophils Relative: 1 % (ref 0–5)
Lymphs Abs: 0.5 10*3/uL — ABNORMAL LOW (ref 0.7–4.0)
MCH: 30.3 pg (ref 26.0–34.0)
Neutrophils Relative %: 88 % — ABNORMAL HIGH (ref 43–77)
Platelets: 162 10*3/uL (ref 150–400)
RBC: 3.63 MIL/uL — ABNORMAL LOW (ref 3.87–5.11)
RDW: 13.8 % (ref 11.5–15.5)
WBC: 9.2 10*3/uL (ref 4.0–10.5)

## 2012-02-12 LAB — COMPREHENSIVE METABOLIC PANEL
ALT: 9 U/L (ref 0–35)
AST: 14 U/L (ref 0–37)
Albumin: 3 g/dL — ABNORMAL LOW (ref 3.5–5.2)
Alkaline Phosphatase: 89 U/L (ref 39–117)
Calcium: 9.6 mg/dL (ref 8.4–10.5)
Glucose, Bld: 99 mg/dL (ref 70–99)
Potassium: 4.8 mEq/L (ref 3.5–5.1)
Sodium: 140 mEq/L (ref 135–145)
Total Protein: 6.2 g/dL (ref 6.0–8.3)

## 2012-02-12 LAB — POCT I-STAT TROPONIN I: Troponin i, poc: 0 ng/mL (ref 0.00–0.08)

## 2012-02-12 LAB — PROTIME-INR: INR: 1.13 (ref 0.00–1.49)

## 2012-02-12 LAB — URINE MICROSCOPIC-ADD ON

## 2012-02-12 LAB — APTT: aPTT: 29 seconds (ref 24–37)

## 2012-02-12 MED ORDER — SODIUM CHLORIDE 0.9 % IV BOLUS (SEPSIS)
500.0000 mL | Freq: Once | INTRAVENOUS | Status: AC
  Administered 2012-02-12: 500 mL via INTRAVENOUS

## 2012-02-12 MED ORDER — SULFAMETHOXAZOLE-TMP DS 800-160 MG PO TABS
1.0000 | ORAL_TABLET | Freq: Once | ORAL | Status: AC
  Administered 2012-02-12: 1 via ORAL
  Filled 2012-02-12: qty 1

## 2012-02-12 MED ORDER — CIPROFLOXACIN IN D5W 400 MG/200ML IV SOLN
400.0000 mg | Freq: Once | INTRAVENOUS | Status: AC
  Administered 2012-02-12: 400 mg via INTRAVENOUS
  Filled 2012-02-12: qty 200

## 2012-02-12 NOTE — ED Notes (Signed)
Pt transported to CT ?

## 2012-02-12 NOTE — ED Notes (Signed)
Pr ems- pt was shopping when she sat down in a chair, became nonresponsive, eyes rolled back. Pt initial BP was 88systolic, latest bp was 116/67. CBG-130 HR-67 NSR unremarkable 12-lead. Pt currently caox4, neg stroke scale. 20g IV LHand.

## 2012-02-12 NOTE — ED Notes (Signed)
PEr EMT, pt felt "very shaky" while ambulating with assistance. Pt normally uses walker to ambulate.

## 2012-02-12 NOTE — ED Notes (Signed)
Ambulated the patient in the unit and the patient stated the she felt shaky and unsteady, we returned to the room and i helped her back in the bed.

## 2012-02-12 NOTE — ED Notes (Signed)
While receiving Cipro IV pt started having some redness and itching near IV site. Site is patent Cipro stopped, NS bolus continued. MD notified.

## 2012-02-12 NOTE — ED Provider Notes (Signed)
History     CSN: [id number]  Arrival date & time 02/12/12  1620   First MD Initiated Contact with Patient 02/12/12 1626      Chief Complaint  Patient presents with  . Loss of Consciousness    (Consider location/radiation/quality/duration/timing/severity/associated sxs/prior treatment) HPI Pt states she was recently admitted to [facility] and her medications were changed. She was feeling well this AM. She ate breakfast and took meds. She then went shopping with her sister. While sitting in the car she became dizzy, described as spinning and had brief syncopal episode. EMS called. Pt states that she feels better now. She c/o of mild epigastric tenderness. No CP, SOB, fever chills, visual changes, focal weakness, N/V/D.  Past Medical History  Diagnosis Date  . High blood pressure   . Arthritis   . Depression   . Anxiety   . Hemorrhoids   . Hyperlipidemia   . Bipolar 1 disorder     Past Surgical History  Procedure Date  . Back surgery 03/16/87  . Cholecystectomy 08/15/93  . Partial hysterectomy 04/04/95  . Bladder tacked 04/04/95  . Ankle surgery 04/28/97    right ankle fracture  . Wrist surgery 02/24/09    right wrist fracture  . Colonoscopy 10/26/2002    Dr. Karilyn Cota- small external hemorrhoids otherwise normal   . Breast biopsy 05/16/95    right side  . Savory dilation 10/31/2011    Procedure: SAVORY DILATION;  Surgeon: Corbin Ade, MD;  Location: [facility];  Service: Endoscopy;  Laterality: N/A;  Elease Hashimoto dilation 10/31/2011    Procedure: Elease Hashimoto DILATION;  Surgeon: Corbin Ade, MD;  Location: [facility];  Service: Endoscopy;  Laterality: N/A;  . Abdominal hysterectomy     Family History  Problem Relation Age of Onset  . Arthritis    . Asthma    . Diabetes    . Colon cancer Neg Hx     History  Substance Use Topics  . Smoking status: Never Smoker   . Smokeless tobacco: Not on file  . Alcohol Use: No    OB History    Grav Para Term Preterm Abortions  TAB SAB Ect Mult Living                  Review of Systems  Constitutional: Negative for fever and chills.  HENT: Negative for neck pain.   Eyes: Negative for visual disturbance.  Respiratory: Negative for cough, wheezing and stridor.   Cardiovascular: Negative for chest pain, palpitations and leg swelling.  Gastrointestinal: Positive for abdominal pain. Negative for nausea, vomiting and diarrhea.  Genitourinary: Negative for dysuria and flank pain.  Musculoskeletal: Negative for back pain.  Skin: Negative for rash and wound.  Neurological: Positive for dizziness and syncope. Negative for seizures, weakness, light-headedness, numbness and headaches.    Allergies  Cephalexin and Penicillins  Home Medications   Current Outpatient Rx  Name Route Sig Dispense Refill  . ACETAMINOPHEN ER 650 MG PO TBCR Oral Take 650 mg by mouth every 8 (eight) hours as needed. For pain    . ASPIRIN EC 81 MG PO TBEC Oral Take 81 mg by mouth every morning.     Marland Kitchen BENZTROPINE MESYLATE 0.5 MG PO TABS Oral Take 1 tablet (0.5 mg total) by mouth at bedtime. 30 tablet 0  . OCUVITE PO TABS Oral Take 1 tablet by mouth every morning.     Marland Kitchen BIOTIN 5000 MCG PO CAPS Oral Take 1 capsule by mouth every  morning.    Marland Kitchen CALCIUM CARBONATE 600 MG PO TABS Oral Take 600 mg by mouth 2 (two) times daily with a meal.     . VITAMIN D 2000 UNITS PO CAPS Oral Take 1 capsule by mouth every morning.     Marland Kitchen DOCUSATE SODIUM 100 MG PO CAPS Oral Take 200 mg by mouth at bedtime.    Marland Kitchen ESTRACE 0.1 MG/GM VA CREA Vaginal Place 2 g vaginally 2 (two) times a week.     Marland Kitchen LAMOTRIGINE 25 MG PO TABS Oral Take 50 mg by mouth 2 (two) times daily.    Marland Kitchen LOSARTAN POTASSIUM 100 MG PO TABS Oral Take 100 mg by mouth every morning.     Marland Kitchen MELOXICAM 15 MG PO TABS Oral Take 15 mg by mouth daily as needed. For pain   *Sometimes patient uses Tylenol Arthritis*    . OMEPRAZOLE 20 MG PO CPDR Oral Take 40 mg by mouth every morning.     Marland Kitchen PAROXETINE HCL 20 MG PO TABS  Oral Take 1 tablet (20 mg total) by mouth every morning. 30 tablet 0  . PAROXETINE HCL 40 MG PO TABS Oral Take 1 tablet (40 mg total) by mouth every morning. 30 tablet 0  . RISPERIDONE 0.5 MG PO TABS Oral Take 0.5 mg by mouth daily. Takes with 1 mg tablet at 1:00 pm    . RISPERIDONE 1 MG PO TABS Oral Take 1 mg by mouth daily. Takes with 0.5 mg tablet at 1:00 pm    . SOLIFENACIN SUCCINATE 10 MG PO TABS Oral Take 10 mg by mouth every morning.     Marland Kitchen TERAZOSIN HCL 1 MG PO CAPS Oral Take 1 mg by mouth at bedtime. Take with the 2 mg capsule at bedtime    . TERAZOSIN HCL 2 MG PO CAPS Oral Take 2 mg by mouth at bedtime. Take with the 1 mg capsule at bedtime    . VITAMIN B-12 1000 MCG PO TABS Oral Take 1,000 mcg by mouth daily.      BP 157/77  Pulse 70  Temp 97 F (36.1 C)  Resp 13  SpO2 99%  Physical Exam  Nursing note and vitals reviewed. Constitutional: She is oriented to person, place, and time. She appears well-developed and well-nourished. No distress.  HENT:  Head: Normocephalic and atraumatic.       MM dry appearing  Eyes: EOM are normal. Pupils are equal, round, and reactive to light.  Neck: Normal range of motion. Neck supple.  Cardiovascular: Normal rate and regular rhythm.   Pulmonary/Chest: Effort normal and breath sounds normal. No respiratory distress. She has no wheezes. She has no rales. She exhibits no tenderness.  Abdominal: Soft. Bowel sounds are normal. She exhibits no distension and no mass. There is tenderness (mild epigastric TTP, No rebound or guarding). There is no rebound and no guarding.  Musculoskeletal: Normal range of motion. She exhibits no edema and no tenderness.  Neurological: She is alert and oriented to person, place, and time.       5/5 motor in all ext. Sensation intact, finger to nose intact, CN II-XII intact  Skin: Skin is warm and dry. No rash noted. No erythema.  Psychiatric: She has a normal mood and affect. Her behavior is normal.    ED Course    Procedures (including critical care time)  Labs Reviewed  CBC WITH DIFFERENTIAL - Abnormal; Notable for the following:    RBC 3.63 (*)     Hemoglobin 11.0 (*)  HCT 34.7 (*)     Neutrophils Relative 88 (*)     Neutro Abs 8.2 (*)     Lymphocytes Relative 6 (*)     Lymphs Abs 0.5 (*)     All other components within normal limits  COMPREHENSIVE METABOLIC PANEL - Abnormal; Notable for the following:    Creatinine, Ser 1.26 (*)     Albumin 3.0 (*)     Total Bilirubin 0.2 (*)     GFR calc non Af Amer 42 (*)     GFR calc Af Amer 48 (*)     All other components within normal limits  URINALYSIS, ROUTINE W REFLEX MICROSCOPIC - Abnormal; Notable for the following:    Leukocytes, UA MODERATE (*)     All other components within normal limits  URINE MICROSCOPIC-ADD ON - Abnormal; Notable for the following:    Squamous Epithelial / LPF FEW (*)     Bacteria, UA MANY (*)     All other components within normal limits  PROTIME-INR  APTT  POCT I-STAT TROPONIN I  URINE CULTURE   Ct Head Wo Contrast  02/12/2012  *RADIOLOGY REPORT*  Clinical Data: Syncope with fall.  CT HEAD WITHOUT CONTRAST  Technique:  Contiguous axial images were obtained from the base of the skull through the vertex without contrast.  Comparison: 09/15/2011  Findings: There is no evidence for acute hemorrhage, hydrocephalus, mass lesion, or abnormal extra-axial fluid collection.  No definite CT evidence for acute infarction.  Diffuse loss of parenchymal volume is consistent with atrophy. Patchy low attenuation in the deep hemispheric and periventricular white matter is nonspecific, but likely reflects chronic microvascular ischemic demyelination. The visualized paranasal sinuses and mastoid air cells are clear.  IMPRESSION: Stable.  No acute intracranial abnormality.  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   Original Report Authenticated By: ERIC A. MANSELL, M.D.      No diagnosis found.   Date:  02/12/2012  Rate: 73  Rhythm: normal sinus rhythm  QRS Axis: normal  Intervals: normal  ST/T Wave abnormalities: normal  Conduction Disutrbances:none  Narrative Interpretation:   Old EKG Reviewed: none available    MDM   Pt with localized redness and itching with IV cipro. Stopped and symptoms resolved. Pt very unsteady ambulating in hallway with assistance. Discussed with [facility]. Will see and admit for observation. Will start on PO Bactrim given pt's drug allergies.       Loren Racer, MD 02/13/12 [phone]

## 2012-02-13 DIAGNOSIS — N390 Urinary tract infection, site not specified: Secondary | ICD-10-CM | POA: Diagnosis present

## 2012-02-13 DIAGNOSIS — R55 Syncope and collapse: Secondary | ICD-10-CM | POA: Diagnosis present

## 2012-02-13 LAB — CBC
Hemoglobin: 10.7 g/dL — ABNORMAL LOW (ref 12.0–15.0)
MCH: 30.2 pg (ref 26.0–34.0)
RBC: 3.54 MIL/uL — ABNORMAL LOW (ref 3.87–5.11)

## 2012-02-13 LAB — BASIC METABOLIC PANEL
CO2: 26 mEq/L (ref 19–32)
Calcium: 9.3 mg/dL (ref 8.4–10.5)
Glucose, Bld: 104 mg/dL — ABNORMAL HIGH (ref 70–99)
Sodium: 142 mEq/L (ref 135–145)

## 2012-02-13 LAB — URINE CULTURE
Colony Count: NO GROWTH
Culture: NO GROWTH

## 2012-02-13 MED ORDER — LAMOTRIGINE 25 MG PO TABS
50.0000 mg | ORAL_TABLET | Freq: Two times a day (BID) | ORAL | Status: DC
  Administered 2012-02-13 – 2012-02-14 (×4): 50 mg via ORAL
  Filled 2012-02-13 (×5): qty 2

## 2012-02-13 MED ORDER — MELOXICAM 15 MG PO TABS
15.0000 mg | ORAL_TABLET | Freq: Every day | ORAL | Status: DC | PRN
  Filled 2012-02-13: qty 1

## 2012-02-13 MED ORDER — RISPERIDONE 0.5 MG PO TABS
1.5000 mg | ORAL_TABLET | ORAL | Status: DC
  Administered 2012-02-13: 1.5 mg via ORAL
  Filled 2012-02-13 (×2): qty 1

## 2012-02-13 MED ORDER — DARIFENACIN HYDROBROMIDE ER 15 MG PO TB24
15.0000 mg | ORAL_TABLET | Freq: Every day | ORAL | Status: DC
  Administered 2012-02-13 – 2012-02-14 (×2): 15 mg via ORAL
  Filled 2012-02-13 (×2): qty 1

## 2012-02-13 MED ORDER — PAROXETINE HCL 20 MG PO TABS
20.0000 mg | ORAL_TABLET | Freq: Every day | ORAL | Status: DC
  Filled 2012-02-13: qty 1

## 2012-02-13 MED ORDER — SULFAMETHOXAZOLE-TMP DS 800-160 MG PO TABS
1.0000 | ORAL_TABLET | Freq: Two times a day (BID) | ORAL | Status: DC
  Administered 2012-02-13 – 2012-02-14 (×4): 1 via ORAL
  Filled 2012-02-13 (×5): qty 1

## 2012-02-13 MED ORDER — LOSARTAN POTASSIUM 50 MG PO TABS
100.0000 mg | ORAL_TABLET | Freq: Every morning | ORAL | Status: DC
  Administered 2012-02-13 – 2012-02-14 (×2): 100 mg via ORAL
  Filled 2012-02-13 (×2): qty 2

## 2012-02-13 MED ORDER — RISPERIDONE 1 MG PO TABS
1.0000 mg | ORAL_TABLET | Freq: Every day | ORAL | Status: DC
  Filled 2012-02-13: qty 1

## 2012-02-13 MED ORDER — PANTOPRAZOLE SODIUM 40 MG PO TBEC
40.0000 mg | DELAYED_RELEASE_TABLET | Freq: Every day | ORAL | Status: DC
  Administered 2012-02-13 – 2012-02-14 (×2): 40 mg via ORAL
  Filled 2012-02-13 (×2): qty 1

## 2012-02-13 MED ORDER — PAROXETINE HCL 20 MG PO TABS
60.0000 mg | ORAL_TABLET | Freq: Every day | ORAL | Status: DC
  Administered 2012-02-13 – 2012-02-14 (×2): 60 mg via ORAL
  Filled 2012-02-13 (×2): qty 3

## 2012-02-13 MED ORDER — ASPIRIN EC 81 MG PO TBEC
81.0000 mg | DELAYED_RELEASE_TABLET | Freq: Every day | ORAL | Status: DC
  Administered 2012-02-13 – 2012-02-14 (×2): 81 mg via ORAL
  Filled 2012-02-13 (×2): qty 1

## 2012-02-13 MED ORDER — BENZTROPINE MESYLATE 0.5 MG PO TABS
0.5000 mg | ORAL_TABLET | Freq: Every day | ORAL | Status: DC
  Administered 2012-02-13: 0.5 mg via ORAL
  Filled 2012-02-13 (×2): qty 1

## 2012-02-13 MED ORDER — TERAZOSIN HCL 1 MG PO CAPS
3.0000 mg | ORAL_CAPSULE | Freq: Every day | ORAL | Status: DC
  Administered 2012-02-13: 3 mg via ORAL
  Filled 2012-02-13 (×2): qty 3

## 2012-02-13 MED ORDER — PNEUMOCOCCAL VAC POLYVALENT 25 MCG/0.5ML IJ INJ
0.5000 mL | INJECTION | INTRAMUSCULAR | Status: AC
  Administered 2012-02-14: 0.5 mL via INTRAMUSCULAR
  Filled 2012-02-13: qty 0.5

## 2012-02-13 MED ORDER — ESTRADIOL 0.1 MG/GM VA CREA
2.0000 g | TOPICAL_CREAM | VAGINAL | Status: DC
  Administered 2012-02-13: 0.25 via VAGINAL
  Filled 2012-02-13 (×2): qty 42.5

## 2012-02-13 MED ORDER — VITAMIN B-12 1000 MCG PO TABS
1000.0000 ug | ORAL_TABLET | Freq: Every day | ORAL | Status: DC
  Administered 2012-02-13 – 2012-02-14 (×2): 1000 ug via ORAL
  Filled 2012-02-13 (×2): qty 1

## 2012-02-13 MED ORDER — BIOTIN 5000 MCG PO CAPS: 1.0000 | ORAL_CAPSULE | Freq: Every morning | ORAL | Status: DC

## 2012-02-13 MED ORDER — OCUVITE PO TABS
1.0000 | ORAL_TABLET | Freq: Every day | ORAL | Status: DC
  Administered 2012-02-13: 1 via ORAL
  Filled 2012-02-13 (×2): qty 1

## 2012-02-13 MED ORDER — INFLUENZA VIRUS VACC SPLIT PF IM SUSP
0.5000 mL | INTRAMUSCULAR | Status: AC
  Administered 2012-02-14: 0.5 mL via INTRAMUSCULAR
  Filled 2012-02-13: qty 0.5

## 2012-02-13 MED ORDER — TERAZOSIN HCL 2 MG PO CAPS: 2.0000 mg | ORAL_CAPSULE | Freq: Every day | ORAL | Status: DC

## 2012-02-13 MED ORDER — CALCIUM CARBONATE 1250 (500 CA) MG PO TABS
1.0000 | ORAL_TABLET | Freq: Two times a day (BID) | ORAL | Status: DC
  Administered 2012-02-13 – 2012-02-14 (×3): 500 mg via ORAL
  Filled 2012-02-13 (×5): qty 1

## 2012-02-13 MED ORDER — RISPERIDONE 0.5 MG PO TABS
0.5000 mg | ORAL_TABLET | Freq: Every day | ORAL | Status: DC
  Filled 2012-02-13: qty 1

## 2012-02-13 MED ORDER — SODIUM CHLORIDE 0.9 % IJ SOLN
3.0000 mL | Freq: Two times a day (BID) | INTRAMUSCULAR | Status: DC
  Administered 2012-02-13 (×3): 3 mL via INTRAVENOUS

## 2012-02-13 MED ORDER — CALCIUM CARBONATE 600 MG PO TABS
600.0000 mg | ORAL_TABLET | Freq: Two times a day (BID) | ORAL | Status: DC
  Filled 2012-02-13: qty 1

## 2012-02-13 MED ORDER — DOCUSATE SODIUM 100 MG PO CAPS
200.0000 mg | ORAL_CAPSULE | Freq: Every day | ORAL | Status: DC
  Administered 2012-02-13: 200 mg via ORAL
  Filled 2012-02-13 (×2): qty 2

## 2012-02-13 NOTE — Progress Notes (Signed)
   Patient seen and examined today by my colleague Dr. Nestor Lewandowsky.  Patient seen and examined, data base reviewed.  UTI and presyncope.  Started on antibiotics, hydrate with IV fluids, PT/OT to evaluate and treat.  We will consider discharge in the morning.  Clint Lipps Pager: [phone] 02/13/2012, 10:34 AM

## 2012-02-13 NOTE — Progress Notes (Signed)
PT was brought up from the ED. Pt was put into bed and oriented to her room. Pt is in no apparent distress and is resting comfortably.

## 2012-02-13 NOTE — Progress Notes (Signed)
  Echocardiogram 2D Echocardiogram has been performed.  Amy Clay 02/13/2012, 2:57 PM

## 2012-02-13 NOTE — Care Management Note (Addendum)
    Page 1 of 2   02/14/2012     12:20:29 PM   CARE MANAGEMENT NOTE 02/14/2012  Patient:  Amy Clay, Amy Clay   Account Number:  [id number]  Date Initiated:  02/13/2012  Documentation initiated by:  Letha Cape  Subjective/Objective Assessment:   dx syncope  admit as observation.  lives with sister, pta independent.     Action/Plan:   pt eval   Anticipated DC Date:  02/14/2012   Anticipated DC Plan:  HOME W HOME HEALTH SERVICES      DC Planning Services  CM consult      [facility] Choice  HOME HEALTH   Choice offered to / List presented to:  C-1 Patient   DME arranged  [facility]      DME agency  [facility]     HH arranged  HH-2 PT  HH-3 OT      [facility] agency  [facility]   Status of service:  Completed, signed off Medicare Important Message given?   (If response is "NO", the following Medicare IM given date fields will be blank) Date Medicare IM given:   Date Additional Medicare IM given:    Discharge Disposition:  HOME W HOME HEALTH SERVICES  Per UR Regulation:  Reviewed for med. necessity/level of care/duration of stay  If discussed at Long Length of Stay Meetings, dates discussed:    Comments:  02/14/12 12:09 Letha Cape RN, BSN [phone] physical therapy rec hhpt, pt states she has worked with [facility] before and would like to work with them again. Referral made to [facility], Marie notified.  Tub /shower bench also ordered.  Patient for dc today.  Soc will begin 24-48 hrs post discharge.  Patient's sister will transport her home.  02/13/12 15:40 Letha Cape RN, BSN [phone] patient lives with sister, pta independent.  Await pt eval. NCM will continue to follow for dc needs.

## 2012-02-13 NOTE — H&P (Signed)
[facility] History and Physical  GRAESYN SERTICH ZOX:[id number] DOB: 04/27/1941 DOA: 02/12/2012  Referring physician: ED PCP: Colette Ribas, MD  Specialists: None  Chief Complaint: Syncope  HPI: Amy Clay is a 71 y.o. female who presents with c/o a single syncopal episode that occurred earlier today while she was sitting in her car at a [employer] store.  The patient did not fall with the episode as she was already in a seated position, she did have feelings of "not feeling well, like she was going to pass out" prior to the episode.  She was taken to the ED and evaluated.  Of note the patient also has a recent history of pain and burning on urination, in the ED work up revealed a UTI was indeed present, given 1.5L NS, they tried to start her on Cipro IV for her UTI but she had what appears to be an allergic reaction to that involving itching at the site of injection that went away shortly after the IV was discontinued.  Unfortunately because the patient is still "shaky" with walking hospitalist service has been asked to admit her for what likely will be an overnight observation.  Review of Systems: Patient also admits she may be somewhat dehydrated as well, otherwise 12 systems reviewed and negative except as per HPI.  Past Medical History  Diagnosis Date  . High blood pressure   . Arthritis   . Depression   . Anxiety   . Hemorrhoids   . Hyperlipidemia   . Bipolar 1 disorder    Past Surgical History  Procedure Date  . Back surgery 03/16/87  . Cholecystectomy 08/15/93  . Partial hysterectomy 04/04/95  . Bladder tacked 04/04/95  . Ankle surgery 04/28/97    right ankle fracture  . Wrist surgery 02/24/09    right wrist fracture  . Colonoscopy 10/26/2002    Dr. Karilyn Cota- small external hemorrhoids otherwise normal   . Breast biopsy 05/16/95    right side  . Savory dilation 10/31/2011    Procedure: SAVORY DILATION;  Surgeon: Corbin Ade, MD;  Location: [facility];   Service: Endoscopy;  Laterality: N/A;  Elease Hashimoto dilation 10/31/2011    Procedure: Elease Hashimoto DILATION;  Surgeon: Corbin Ade, MD;  Location: [facility];  Service: Endoscopy;  Laterality: N/A;  . Abdominal hysterectomy    Social History:  reports that she has never smoked. She does not have any smokeless tobacco history on file. She reports that she does not drink alcohol or use illicit drugs.  Allergies  Allergen Reactions  . Ciprofloxacin     Rash at site of injection with itching  . Cephalexin Rash and Other (See Comments)    Blistering of the mouth  . Penicillins Rash    Family History  Problem Relation Age of Onset  . Arthritis    . Asthma    . Diabetes    . Colon cancer Neg Hx     Prior to Admission medications   Medication Sig Start Date End Date Taking? Authorizing Provider  acetaminophen (TYLENOL) 650 MG CR tablet Take 650 mg by mouth every 8 (eight) hours as needed. For pain   Yes Historical Provider, MD  aspirin EC 81 MG tablet Take 81 mg by mouth every morning.    Yes Historical Provider, MD  benztropine (COGENTIN) 0.5 MG tablet Take 1 tablet (0.5 mg total) by mouth at bedtime. 01/23/12 01/22/13 Yes Cleotis Nipper, MD  beta carotene w/minerals (OCUVITE) tablet Take 1  tablet by mouth every morning.    Yes Historical Provider, MD  Biotin 5000 MCG CAPS Take 1 capsule by mouth every morning.   Yes Historical Provider, MD  calcium carbonate (OS-CAL) 600 MG TABS Take 600 mg by mouth 2 (two) times daily with a meal.    Yes Historical Provider, MD  Cholecalciferol (VITAMIN D) 2000 UNITS CAPS Take 1 capsule by mouth every morning.    Yes Historical Provider, MD  docusate sodium (STOOL SOFTENER) 100 MG capsule Take 200 mg by mouth at bedtime.   Yes Historical Provider, MD  ESTRACE VAGINAL 0.1 MG/GM vaginal cream Place 2 g vaginally 2 (two) times a week.  10/15/10  Yes Historical Provider, MD  lamoTRIgine (LAMICTAL) 25 MG tablet Take 50 mg by mouth 2 (two) times daily.   Yes  Historical Provider, MD  losartan (COZAAR) 100 MG tablet Take 100 mg by mouth every morning.  10/04/11  Yes Historical Provider, MD  meloxicam (MOBIC) 15 MG tablet Take 15 mg by mouth daily as needed. For pain   *Sometimes patient uses Tylenol Arthritis* 09/28/10  Yes Historical Provider, MD  omeprazole (PRILOSEC) 20 MG capsule Take 40 mg by mouth every morning.  09/19/10  Yes Historical Provider, MD  PARoxetine (PAXIL) 20 MG tablet Take 1 tablet (20 mg total) by mouth every morning. 01/23/12  Yes Cleotis Nipper, MD  PARoxetine (PAXIL) 40 MG tablet Take 1 tablet (40 mg total) by mouth every morning. 01/23/12  Yes Cleotis Nipper, MD  risperiDONE (RISPERDAL) 0.5 MG tablet Take 0.5 mg by mouth daily. Takes with 1 mg tablet at 1:00 pm   Yes Historical Provider, MD  risperiDONE (RISPERDAL) 1 MG tablet Take 1 mg by mouth daily. Takes with 0.5 mg tablet at 1:00 pm   Yes Historical Provider, MD  solifenacin (VESICARE) 10 MG tablet Take 10 mg by mouth every morning.    Yes Historical Provider, MD  terazosin (HYTRIN) 1 MG capsule Take 1 mg by mouth at bedtime. Take with the 2 mg capsule at bedtime   Yes Historical Provider, MD  terazosin (HYTRIN) 2 MG capsule Take 2 mg by mouth at bedtime. Take with the 1 mg capsule at bedtime   Yes Historical Provider, MD  vitamin B-12 (CYANOCOBALAMIN) 1000 MCG tablet Take 1,000 mcg by mouth daily.   Yes Historical Provider, MD   Physical Exam: Filed Vitals:   02/12/12 2100 02/12/12 2113 02/12/12 2130 02/12/12 2204  BP: 173/83 155/72 161/68 157/77  Pulse: 74 75 74 70  Temp:      Resp:  17 16 13   SpO2: 98% 100% 97% 99%     General:  NAD resting comfortably in hospital bed  Eyes: PEERLA EOMI  ENT: dry mucous membranes  Neck: supple w/o JVD  Cardiovascular: RRR w/o MRG  Respiratory: CTA B  Abdomen: soft, nt, nd, bs+  Skin: no rashes or lesions, the area of redness previously noted has dissapeared  Musculoskeletal: MAE, full ROM all 4 extremities  Psychiatric:  Normal mood and affect  Neurologic: AAOx3 grossly non-focal exam  Labs on Admission:  Basic Metabolic Panel:  Lab 02/12/12 9147  NA 140  K 4.8  CL 108  CO2 25  GLUCOSE 99  BUN 20  CREATININE 1.26*  CALCIUM 9.6  MG --  PHOS --   Liver Function Tests:  Lab 02/12/12 1745  AST 14  ALT 9  ALKPHOS 89  BILITOT 0.2*  PROT 6.2  ALBUMIN 3.0*   No results found for  this basename: LIPASE:5,AMYLASE:5 in the last 168 hours No results found for this basename: AMMONIA:5 in the last 168 hours CBC:  Lab 02/12/12 1745  WBC 9.2  NEUTROABS 8.2*  HGB 11.0*  HCT 34.7*  MCV 95.6  PLT 162   Cardiac Enzymes: No results found for this basename: CKTOTAL:5,CKMB:5,CKMBINDEX:5,TROPONINI:5 in the last 168 hours  BNP (last 3 results) No results found for this basename: PROBNP:3 in the last 8760 hours CBG: No results found for this basename: GLUCAP:5 in the last 168 hours  Radiological Exams on Admission: Ct Head Wo Contrast  02/12/2012  *RADIOLOGY REPORT*  Clinical Data: Syncope with fall.  CT HEAD WITHOUT CONTRAST  Technique:  Contiguous axial images were obtained from the base of the skull through the vertex without contrast.  Comparison: 09/15/2011  Findings: There is no evidence for acute hemorrhage, hydrocephalus, mass lesion, or abnormal extra-axial fluid collection.  No definite CT evidence for acute infarction.  Diffuse loss of parenchymal volume is consistent with atrophy. Patchy low attenuation in the deep hemispheric and periventricular white matter is nonspecific, but likely reflects chronic microvascular ischemic demyelination. The visualized paranasal sinuses and mastoid air cells are clear.  IMPRESSION: Stable.  No acute intracranial abnormality.  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   Original Report Authenticated By: ERIC A. MANSELL, M.D.     EKG: Independently reviewed.  Assessment/Plan Principal Problem:  *Syncope Active Problems:  UTI  (lower urinary tract infection)   1. Syncope - single syncopal episode likely secondary to UTI at this point, will go ahead and order 2d echo and put patient on telemetry just in case as well as ordering serial troponins, but less likely to be cardiogenic: patient has few risk factors for this.  Will defer decision to day team if they wish to get cards involved but at this time the UTI remains the most likely suspect. 2. UTI - with dysuria and bacturia on lab work, cultures pending, will start bactrim on this patient who is allergic to PCN, cephalosporins, and now flouroquinolones it seems (have added the last to her allergy list as well). 3. BPD type 1 - continue home meds  Code Status: Full Code Family Communication: Spoke with sister and another family member who was in room Disposition Plan: Admit to obs, likely discharge tomorrow.  Time spent: 50 min  GARDNER, JARED M. [facility] Pager [phone]  If 7PM-7AM, please contact night-coverage [email or web address] Password [email or web address] 02/13/2012, 1:15 AM

## 2012-02-13 NOTE — Progress Notes (Addendum)
Brief Nutrition Note:  Pt reports weight loss and poor appetite PTA per MST (Malnutrition Screening Tool)   Pt admitted with UTI for observation.    Weight hx:  Wt Readings from Last 5 Encounters:  02/13/12 213 lb 3 oz (96.7 kg)  01/23/12 200 lb (90.719 kg)  12/30/11 196 lb (88.905 kg)  12/13/11 203 lb (92.08 kg)  10/31/11 201 lb (91.173 kg)     Weight has been  Trending up  Body mass index is 34.41 kg/(m^2). Pt is obese class 1 per current BMI   Current diet is regular, pt consumed 75% of last meal.   Chart reviewed, no nutrition interventions warranted at this time, please consult as needed.   Clarene Duke RD, LDN Pager [phone] After Hours pager [phone]

## 2012-02-14 DIAGNOSIS — M129 Arthropathy, unspecified: Secondary | ICD-10-CM | POA: Diagnosis not present

## 2012-02-14 DIAGNOSIS — R55 Syncope and collapse: Secondary | ICD-10-CM | POA: Diagnosis not present

## 2012-02-14 DIAGNOSIS — N390 Urinary tract infection, site not specified: Secondary | ICD-10-CM

## 2012-02-14 LAB — BASIC METABOLIC PANEL
BUN: 18 mg/dL (ref 6–23)
Chloride: 109 mEq/L (ref 96–112)
GFR calc non Af Amer: 39 mL/min — ABNORMAL LOW (ref >90)
Glucose, Bld: 98 mg/dL (ref 70–99)
Potassium: 3.9 mEq/L (ref 3.5–5.1)

## 2012-02-14 LAB — CBC
HCT: 35 % — ABNORMAL LOW (ref 36.0–46.0)
Hemoglobin: 11.1 g/dL — ABNORMAL LOW (ref 12.0–15.0)
MCH: 30 pg (ref 26.0–34.0)
MCHC: 31.7 g/dL (ref 30.0–36.0)

## 2012-02-14 MED ORDER — SULFAMETHOXAZOLE-TRIMETHOPRIM 800-160 MG PO TABS: 1.0000 | ORAL_TABLET | Freq: Two times a day (BID) | ORAL | Status: DC

## 2012-02-14 MED ORDER — DIPHENHYDRAMINE HCL 25 MG PO CAPS
25.0000 mg | ORAL_CAPSULE | Freq: Once | ORAL | Status: AC
  Administered 2012-02-14: 25 mg via ORAL
  Filled 2012-02-14: qty 1

## 2012-02-14 NOTE — Evaluation (Signed)
Occupational Therapy Evaluation Patient Details Name: Amy Clay MRN: [id number] DOB: [birth year] Today's Date: 02/14/2012 Time: 4098-1191 OT Time Calculation (min): 18 min  OT Assessment / Plan / Recommendation Clinical Impression  Amy Clay is a 71 y.o. female who presents with c/o a single syncopal episode She displays decreased balance and some safety with ADl. Will benefit from skilled OT to improve independence with safety and independence with these self care tasks.     OT Assessment  Patient needs continued OT Services    Follow Up Recommendations  Home health OT;Supervision/Assistance - 24 hour    Barriers to Discharge      Equipment Recommendations  Tub/shower bench    Recommendations for Other Services    Frequency  Min 2X/week    Precautions / Restrictions Precautions Precautions: Fall Restrictions Weight Bearing Restrictions: No        ADL  Eating/Feeding: Simulated;Independent Where Assessed - Eating/Feeding: Chair Grooming: Simulated;Wash/dry hands;Set up Where Assessed - Grooming: Supported sitting Upper Body Bathing: Simulated;Chest;Right arm;Left arm;Abdomen;Set up Where Assessed - Upper Body Bathing: Unsupported sitting Lower Body Bathing: Simulated;Minimal assistance Where Assessed - Lower Body Bathing: Supported sit to stand Upper Body Dressing: Simulated;Set up Where Assessed - Upper Body Dressing: Unsupported sitting Lower Body Dressing: Simulated;Minimal assistance Where Assessed - Lower Body Dressing: Supported sit to stand Toilet Transfer: Performed;Minimal Web designer: Raised toilet seat with arms (or 3-in-1 over toilet) Toileting - Clothing Manipulation and Hygiene: Simulated;Minimal assistance Where Assessed - Engineer, mining and Hygiene: Sit to stand from 3-in-1 or toilet Equipment Used: Rolling walker ADL Comments: Pt with questions regarding how tubbench works. Sister present and  gave education including demonstration of transfer on and off tubbench and explained benefits of use. Pt states she has had trouble getting out of her tub in the past. Pt needed min verbal cues for hand placement and overall RW safety.     OT Diagnosis: Generalized weakness  OT Problem List: Decreased strength;Impaired balance (sitting and/or standing);Decreased knowledge of use of DME or AE;Decreased safety awareness OT Treatment Interventions: Self-care/ADL training;Therapeutic activities;DME and/or AE instruction;Patient/family education   OT Goals Acute Rehab OT Goals OT Goal Formulation: With patient/family Time For Goal Achievement: 02/28/12 Potential to Achieve Goals: Good ADL Goals Pt Will Perform Grooming: with supervision;Standing at sink ADL Goal: Grooming - Progress: Goal set today Pt Will Perform Lower Body Bathing: with supervision;Sit to stand from chair;Sit to stand from bed;Other (comment) (own setup) ADL Goal: Lower Body Bathing - Progress: Goal set today Pt Will Perform Lower Body Dressing: with supervision;Sit to stand from bed;Sit to stand from chair ADL Goal: Lower Body Dressing - Progress: Goal set today Pt Will Transfer to Toilet: with supervision;Ambulation;Comfort height toilet;Grab bars ADL Goal: Toilet Transfer - Progress: Goal set today Pt Will Perform Toileting - Clothing Manipulation: with supervision;Standing ADL Goal: Toileting - Clothing Manipulation - Progress: Goal set today Pt Will Perform Tub/Shower Transfer: with supervision;with DME;Transfer tub bench ADL Goal: Tub/Shower Transfer - Progress: Goal set today  Visit Information  Last OT Received On: 02/14/12 Assistance Needed: +1    Subjective Data  Subjective: I dont know how this tubbench works Patient Stated Goal: wants to go home   Prior Functioning     Home Living Lives With: Family (sister, 24yrs younger) Available Help at Discharge: Family;Available 24 hours/day Type of Home:  House Home Access: Ramped entrance Home Layout: One level Bathroom Shower/Tub: Engineer, manufacturing systems: Handicapped height Home Adaptive Equipment: Grab bars around  toilet;Grab bars in shower;Quad cane;Walker - rolling;Walker - four wheeled Prior Function Level of Independence: Needs assistance Needs Assistance: Light Housekeeping;Meal Prep;Bathing Bath: Minimal (sister helps with her back periodically) Meal Prep: Moderate Light Housekeeping: Moderate Able to Take Stairs?: No Driving: No Vocation: Retired Comments: Pt states she walks with quad cane in the house because of narrow pathways and uses rollator outside of the house. Pt sponges bathes due to fear of falling in tub. Pt and sister share housework chores Communication Communication: No difficulties         Vision/Perception     Cognition  Overall Cognitive Status: Appears within functional limits for tasks assessed/performed Arousal/Alertness: Awake/alert Orientation Level: Appears intact for tasks assessed Behavior During Session: [facility] for tasks performed    Extremity/Trunk Assessment Right Upper Extremity Assessment RUE ROM/Strength/Tone: [facility] for tasks assessed Left Upper Extremity Assessment LUE ROM/Strength/Tone: WFL for tasks assessed Right Lower Extremity Assessment RLE ROM/Strength/Tone: WFL for tasks assessed RLE Coordination: WFL - gross/fine motor Left Lower Extremity Assessment LLE ROM/Strength/Tone: WFL for tasks assessed LLE Coordination: WFL - gross/fine motor Trunk Assessment Trunk Assessment: Kyphotic     Mobility Bed Mobility Bed Mobility: Supine to Sit;Sitting - Scoot to Edge of Bed Supine to Sit: 5: Supervision;HOB elevated;With rails (HOB 20degrees) Sitting - Scoot to Edge of Bed: 5: Supervision Details for Bed Mobility Assistance: increased time and need for rail to achieve sitting with cueing to scoot to EOB due to fact pt with posterior lean attempting to get to  EOB Transfers Transfers: Sit to Stand;Stand to Sit Sit to Stand: 4: Min assist;With upper extremity assist;From chair/3-in-1 Stand to Sit: 4: Min assist;With upper extremity assist;To chair/3-in-1 Details for Transfer Assistance: X2 trials to stand from chair and needed min assist to rise and stabilize. Also cues for hand placement and min assist to control descent slightly.      Shoulder Instructions     Exercise     Balance     End of Session OT - End of Session Activity Tolerance: Patient tolerated treatment well Patient left: in chair;with call bell/phone within reach;with family/visitor present  GO Functional Assessment Tool Used: clinical judgement Functional Limitation: Self care Self Care Current Status (J1914): At least 20 percent but less than 40 percent impaired, limited or restricted Self Care Goal Status (N8295): At least 1 percent but less than 20 percent impaired, limited or restricted   Lennox Laity [phone] 02/14/2012, 11:00 AM

## 2012-02-14 NOTE — Evaluation (Deleted)
Physical Therapy Evaluation Patient Details Name: Amy Clay MRN: [id number] DOB: 1940/09/15 Today's Date: 02/14/2012 Time: 7829-5621 PT Time Calculation (min): 23 min  PT Assessment / Plan / Recommendation Clinical Impression  Pt admitted after syncopal episode while shopping. Pt reports no other falls or syncope in the last year. Pt does state she was shopping for new clothes because of weight loss related to lack of eating and drinking due to depression over her financial situation and states she was in [facility] for two weeks the end of August. Pt currently needs to use RW for all ambulation and requires assist for transfers, pt states sister can provide this assist at home. Will follow acutely to maximize mobilty, transfers and function to decrease burden of care prior to discharge.     PT Assessment  Patient needs continued PT services    Follow Up Recommendations  Home health PT;Supervision for mobility/OOB    Barriers to Discharge None      Equipment Recommendations  Tub/shower bench    Recommendations for Other Services     Frequency Min 3X/week    Precautions / Restrictions Precautions Precautions: Fall   Pertinent Vitals/Pain No pain      Mobility  Bed Mobility Bed Mobility: Supine to Sit;Sitting - Scoot to Edge of Bed Supine to Sit: 5: Supervision;HOB elevated;With rails (HOB 20degrees) Sitting - Scoot to Edge of Bed: 5: Supervision Details for Bed Mobility Assistance: increased time and need for rail to achieve sitting with cueing to scoot to EOB due to fact pt with posterior lean attempting to get to EOB Transfers Transfers: Sit to Stand;Stand to Sit Sit to Stand: 4: Min guard;From bed Stand to Sit: 4: Min guard;To bed;To chair/3-in-1 Details for Transfer Assistance: assist to achieve anterior translation and standing from bed x 2 trials with cueing for hand placement and safety to sit Ambulation/Gait Ambulation/Gait Assistance: 4: Min  guard Ambulation Distance (Feet): 300 Feet Assistive device: Rolling walker Ambulation/Gait Assistance Details: pt initiated gait with quad cane and reaching for therapist and quickly switched to RW which pt able to maintain without assist but cueing to avoid obstacles and for positioning in RW Gait Pattern: Step-through pattern;Decreased stride length;Trunk flexed Gait velocity: decreased Stairs: No    Shoulder Instructions     Exercises     PT Diagnosis: Difficulty walking  PT Problem List: Decreased activity tolerance;Decreased mobility;Decreased balance;Decreased knowledge of use of DME PT Treatment Interventions: Gait training;DME instruction;Functional mobility training;Therapeutic activities;Therapeutic exercise;Balance training;Patient/family education   PT Goals Acute Rehab PT Goals PT Goal Formulation: With patient Time For Goal Achievement: 02/21/12 Potential to Achieve Goals: Good Pt will go Supine/Side to Sit: with modified independence;with HOB 0 degrees PT Goal: Supine/Side to Sit - Progress: Goal set today Pt will go Sit to Supine/Side: with modified independence;with HOB 0 degrees PT Goal: Sit to Supine/Side - Progress: Goal set today Pt will go Sit to Stand: with supervision PT Goal: Sit to Stand - Progress: Goal set today Pt will go Stand to Sit: with supervision PT Goal: Stand to Sit - Progress: Goal set today Pt will Ambulate: >150 feet;with modified independence;with least restrictive assistive device PT Goal: Ambulate - Progress: Goal set today  Visit Information  Last PT Received On: 02/14/12 Assistance Needed: +1    Subjective Data  Subjective: "I was trying on clothes at [facility] and sat down on my rollator and passed out" Patient Stated Goal: return home with my sister   Prior Functioning  Home Living  Lives With: Family (sister, 43yrs younger) Available Help at Discharge: Family;Available 24 hours/day Type of Home: House Home Access: Ramped  entrance Home Layout: One level Bathroom Shower/Tub: Engineer, manufacturing systems: Handicapped height Home Adaptive Equipment: Bedside commode/3-in-1;Grab bars around toilet;Grab bars in shower;Quad cane;Walker - rolling;Walker - four wheeled Prior Function Level of Independence: Needs assistance Needs Assistance: Light Housekeeping;Meal Prep;Bathing Bath: Minimal (sister helps with her back periodically) Meal Prep: Moderate Light Housekeeping: Moderate Able to Take Stairs?: No Driving: No Vocation: Retired Comments: Pt states she walks with quad cane in the house because of narrow pathways and uses rollator outside of the house. Pt sponges bathes due to fear of falling in tub. Pt and sister share housework chores Communication Communication: No difficulties    Cognition  Overall Cognitive Status: Appears within functional limits for tasks assessed/performed Arousal/Alertness: Awake/alert Orientation Level: Appears intact for tasks assessed Behavior During Session: [facility] for tasks performed    Extremity/Trunk Assessment Right Lower Extremity Assessment RLE ROM/Strength/Tone: WFL for tasks assessed RLE Coordination: WFL - gross/fine motor Left Lower Extremity Assessment LLE ROM/Strength/Tone: WFL for tasks assessed LLE Coordination: WFL - gross/fine motor Trunk Assessment Trunk Assessment: Kyphotic   Balance    End of Session PT - End of Session Equipment Utilized During Treatment: Gait belt Activity Tolerance: Patient tolerated treatment well Patient left: in chair;with call bell/phone within reach Nurse Communication: Mobility status  GP     Toney Sang Beth 02/14/2012, 9:04 AM  Delaney Meigs, PT [phone]

## 2012-02-14 NOTE — Progress Notes (Signed)
[facility]  Patient Status: New  [facility] is providing the following services: PT  If patient discharges after hours, please call [phone].   Amy Clay 02/14/2012, 9:55 AM

## 2012-02-14 NOTE — Evaluation (Signed)
Physical Therapy Evaluation Patient Details Name: Amy Clay MRN: [id number] DOB: 1940/09/15 Today's Date: 02/14/2012 Time: 7829-5621 PT Time Calculation (min): 23 min  PT Assessment / Plan / Recommendation Clinical Impression  Pt admitted after syncopal episode while shopping. Pt reports no other falls or syncope in the last year. Pt does state she was shopping for new clothes because of weight loss related to lack of eating and drinking due to depression over her financial situation and states she was in [facility] for two weeks the end of August. Pt currently needs to use RW for all ambulation and requires assist for transfers, pt states sister can provide this assist at home. Will follow acutely to maximize mobilty, transfers and function to decrease burden of care prior to discharge.     PT Assessment  Patient needs continued PT services    Follow Up Recommendations  Home health PT;Supervision for mobility/OOB    Barriers to Discharge None      Equipment Recommendations  Tub/shower bench    Recommendations for Other Services     Frequency Min 3X/week    Precautions / Restrictions Precautions Precautions: Fall   Pertinent Vitals/Pain No pain      Mobility  Bed Mobility Bed Mobility: Supine to Sit;Sitting - Scoot to Edge of Bed Supine to Sit: 5: Supervision;HOB elevated;With rails (HOB 20degrees) Sitting - Scoot to Edge of Bed: 5: Supervision Details for Bed Mobility Assistance: increased time and need for rail to achieve sitting with cueing to scoot to EOB due to fact pt with posterior lean attempting to get to EOB Transfers Transfers: Sit to Stand;Stand to Sit Sit to Stand: 4: Min guard;From bed Stand to Sit: 4: Min guard;To bed;To chair/3-in-1 Details for Transfer Assistance: assist to achieve anterior translation and standing from bed x 2 trials with cueing for hand placement and safety to sit Ambulation/Gait Ambulation/Gait Assistance: 4: Min  guard Ambulation Distance (Feet): 300 Feet Assistive device: Rolling walker Ambulation/Gait Assistance Details: pt initiated gait with quad cane and reaching for therapist and quickly switched to RW which pt able to maintain without assist but cueing to avoid obstacles and for positioning in RW Gait Pattern: Step-through pattern;Decreased stride length;Trunk flexed Gait velocity: decreased Stairs: No    Shoulder Instructions     Exercises     PT Diagnosis: Difficulty walking  PT Problem List: Decreased activity tolerance;Decreased mobility;Decreased balance;Decreased knowledge of use of DME PT Treatment Interventions: Gait training;DME instruction;Functional mobility training;Therapeutic activities;Therapeutic exercise;Balance training;Patient/family education   PT Goals Acute Rehab PT Goals PT Goal Formulation: With patient Time For Goal Achievement: 02/21/12 Potential to Achieve Goals: Good Pt will go Supine/Side to Sit: with modified independence;with HOB 0 degrees PT Goal: Supine/Side to Sit - Progress: Goal set today Pt will go Sit to Supine/Side: with modified independence;with HOB 0 degrees PT Goal: Sit to Supine/Side - Progress: Goal set today Pt will go Sit to Stand: with supervision PT Goal: Sit to Stand - Progress: Goal set today Pt will go Stand to Sit: with supervision PT Goal: Stand to Sit - Progress: Goal set today Pt will Ambulate: >150 feet;with modified independence;with least restrictive assistive device PT Goal: Ambulate - Progress: Goal set today  Visit Information  Last PT Received On: 02/14/12 Assistance Needed: +1    Subjective Data  Subjective: "I was trying on clothes at [facility] and sat down on my rollator and passed out" Patient Stated Goal: return home with my sister   Prior Functioning  Home Living  Lives With: Family (sister, 52yrs younger) Available Help at Discharge: Family;Available 24 hours/day Type of Home: House Home Access: Ramped  entrance Home Layout: One level Bathroom Shower/Tub: Engineer, manufacturing systems: Handicapped height Home Adaptive Equipment: Bedside commode/3-in-1;Grab bars around toilet;Grab bars in shower;Quad cane;Walker - rolling;Walker - four wheeled Prior Function Level of Independence: Needs assistance Needs Assistance: Light Housekeeping;Meal Prep;Bathing Bath: Minimal (sister helps with her back periodically) Meal Prep: Moderate Light Housekeeping: Moderate Able to Take Stairs?: No Driving: No Vocation: Retired Comments: Pt states she walks with quad cane in the house because of narrow pathways and uses rollator outside of the house. Pt sponges bathes due to fear of falling in tub. Pt and sister share housework chores Communication Communication: No difficulties    Cognition  Overall Cognitive Status: Appears within functional limits for tasks assessed/performed Arousal/Alertness: Awake/alert Orientation Level: Appears intact for tasks assessed Behavior During Session: [facility] for tasks performed    Extremity/Trunk Assessment Right Lower Extremity Assessment RLE ROM/Strength/Tone: WFL for tasks assessed RLE Coordination: WFL - gross/fine motor Left Lower Extremity Assessment LLE ROM/Strength/Tone: WFL for tasks assessed LLE Coordination: WFL - gross/fine motor Trunk Assessment Trunk Assessment: Kyphotic   Balance    End of Session PT - End of Session Equipment Utilized During Treatment: Gait belt Activity Tolerance: Patient tolerated treatment well Patient left: in chair;with call bell/phone within reach Nurse Communication: Mobility status  GP Functional Assessment Tool Used: clinical judgement Functional Limitation: Mobility: Walking and moving around Mobility: Walking and Moving Around Current Status [phone]): At least 1 percent but less than 20 percent impaired, limited or restricted Mobility: Walking and Moving Around Goal Status [phone]): At least 1 percent but less than 20  percent impaired, limited or restricted   Amy Clay 02/14/2012, 9:05 AM  Amy Clay, PT [phone]

## 2012-02-14 NOTE — Discharge Summary (Signed)
Physician Discharge Summary  Amy Clay:[id number] DOB: [birth year] DOA: 02/12/2012  PCP: Colette Ribas, MD  Admit date: 02/12/2012 Discharge date: 02/14/2012  Recommendations for Outpatient Follow-up:  1. Followup with primary care physician  Discharge Diagnoses:  Principal Problem:  *Syncope Active Problems:  UTI (lower urinary tract infection)   Discharge Condition: Stable  Diet recommendation: Low sodium diet  Filed Weights   02/13/12 0329  Weight: 96.7 kg (213 lb 3 oz)    History of present illness:  Amy Clay is a 71 y.o. female who presents with c/o a single syncopal episode that occurred earlier today while she was sitting in her car at a department store. The patient did not fall with the episode as she was already in a seated position, she did have feelings of "not feeling well, like she was going to pass out" prior to the episode. She was taken to the ED and evaluated.  Of note the patient also has a recent history of pain and burning on urination, in the ED work up revealed a UTI was indeed present, given 1.5L NS, they tried to start her on Cipro IV for her UTI but she had what appears to be an allergic reaction to that involving itching at the site of injection that went away shortly after the IV was discontinued. Unfortunately because the patient is still "shaky" with walking hospitalist service has been asked to admit her for what likely will be an overnight observation.   Hospital Course:   1. Presyncope: Patient admitted to the hospital because of dizziness, this happened while she was already seated, patient did not fall. Patient denied any palpitations, denies any chest pain or shortness of breath. Denies any loss of consciousness or confusion after the dizziness spell. Patient evaluated in the emergency department with CT scan of the head which showed no acute events, 2-D echocardiogram was done and showed appropriate ejection fraction, this  presyncopal episode thought to be secondary to dehydration as patient herself mentioned that she does not drink a lot and she does not keep her self hydrated. Patient had previous episode before which was in the summer time, and she was also advised to keep herself hydrated with a lot of fluids.  2. UTI: At the time of admission her urinalysis was consistent with UTI, with 11-20 puss cells. Patient was started on Rocephin in the emergency department, the culture did not show any growth but I think she had a UTI. Please note that she had reaction to ciprofloxacin in the emergency department which is caused itchiness, this was switched to Bactrim, patient to be on Bactrim for total of 5 days.  3. Arthritis: Her medication continued.  4. Hypertension: She is on losartan, she is to have chronic kidney disease with creatinine of 1.3. This is about her baseline. Kidney function should be watched periodically as outpatient.  5. Borderline personality disorder type I: Continue psychiatric medication followup with psych as outpatient.   Procedures:  Echocardiogram on 02/13/2012 showed LVEF of 60-65% with normal wall motion, there is grade 1 diastolic dysfunction, no valvular abnormalities.  Consultations:  None  Discharge Exam: Filed Vitals:   02/13/12 1420 02/13/12 2051 02/14/12 0635 02/14/12 0856  BP: 164/94 168/96 146/89   Pulse: 84 79 78 123  Temp: 98.3 F (36.8 C) 98.3 F (36.8 C) 98.6 F (37 C)   TempSrc: Oral Oral Oral   Resp: 20 16 16    Height:      Weight:  SpO2: 98% 99% 95%    General: Alert and awake, oriented x3, not in any acute distress. HEENT: anicteric sclera, pupils reactive to light and accommodation, EOMI CVS: S1-S2 clear, no murmur rubs or gallops Chest: clear to auscultation bilaterally, no wheezing, rales or rhonchi Abdomen: soft nontender, nondistended, normal bowel sounds, no organomegaly Extremities: no cyanosis, clubbing or edema noted bilaterally Neuro:  Cranial nerves II-XII intact, no focal neurological deficits  Discharge Instructions  Discharge Orders    Future Orders Please Complete By Expires   Diet - low sodium heart healthy      Increase activity slowly          Medication List     As of 02/14/2012 11:09 AM    TAKE these medications         acetaminophen 650 MG CR tablet   Commonly known as: TYLENOL   Take 650 mg by mouth every 8 (eight) hours as needed. For pain      aspirin EC 81 MG tablet   Take 81 mg by mouth every morning.      benztropine 0.5 MG tablet   Commonly known as: COGENTIN   Take 1 tablet (0.5 mg total) by mouth at bedtime.      beta carotene w/minerals tablet   Take 1 tablet by mouth every morning.      Biotin 5000 MCG Caps   Take 1 capsule by mouth every morning.      calcium carbonate 600 MG Tabs   Commonly known as: OS-CAL   Take 600 mg by mouth 2 (two) times daily with a meal.      ESTRACE VAGINAL 0.1 MG/GM vaginal cream   Generic drug: estradiol   Place 2 g vaginally 2 (two) times a week.      lamoTRIgine 25 MG tablet   Commonly known as: LAMICTAL   Take 50 mg by mouth 2 (two) times daily.      losartan 100 MG tablet   Commonly known as: COZAAR   Take 100 mg by mouth every morning.      meloxicam 15 MG tablet   Commonly known as: MOBIC   Take 15 mg by mouth daily as needed. For pain   *Sometimes patient uses Tylenol Arthritis*      omeprazole 20 MG capsule   Commonly known as: PRILOSEC   Take 40 mg by mouth every morning.      PARoxetine 20 MG tablet   Commonly known as: PAXIL   Take 1 tablet (20 mg total) by mouth every morning.      PARoxetine 40 MG tablet   Commonly known as: PAXIL   Take 1 tablet (40 mg total) by mouth every morning.      risperiDONE 0.5 MG tablet   Commonly known as: RISPERDAL   Take 0.5 mg by mouth daily. Takes with 1 mg tablet at 1:00 pm      risperiDONE 1 MG tablet   Commonly known as: RISPERDAL   Take 1 mg by mouth daily. Takes with 0.5 mg  tablet at 1:00 pm      solifenacin 10 MG tablet   Commonly known as: VESICARE   Take 10 mg by mouth every morning.      STOOL SOFTENER 100 MG capsule   Generic drug: docusate sodium   Take 200 mg by mouth at bedtime.      sulfamethoxazole-trimethoprim 800-160 MG per tablet   Commonly known as: BACTRIM DS,SEPTRA DS   Take 1 tablet  by mouth 2 (two) times daily.      terazosin 2 MG capsule   Commonly known as: HYTRIN   Take 2 mg by mouth at bedtime. Take with the 1 mg capsule at bedtime      terazosin 1 MG capsule   Commonly known as: HYTRIN   Take 1 mg by mouth at bedtime. Take with the 2 mg capsule at bedtime      vitamin B-12 1000 MCG tablet   Commonly known as: CYANOCOBALAMIN   Take 1,000 mcg by mouth daily.      Vitamin D 2000 UNITS Caps   Take 1 capsule by mouth every morning.           Follow-up Information    Follow up with Colette Ribas, MD. In 1 week.   Contact information:   [address] [place] [address] [phone]           The results of significant diagnostics from this hospitalization (including imaging, microbiology, ancillary and laboratory) are listed below for reference.    Significant Diagnostic Studies: Ct Head Wo Contrast  02/12/2012  *RADIOLOGY REPORT*  Clinical Data: Syncope with fall.  CT HEAD WITHOUT CONTRAST  Technique:  Contiguous axial images were obtained from the base of the skull through the vertex without contrast.  Comparison: 09/15/2011  Findings: There is no evidence for acute hemorrhage, hydrocephalus, mass lesion, or abnormal extra-axial fluid collection.  No definite CT evidence for acute infarction.  Diffuse loss of parenchymal volume is consistent with atrophy. Patchy low attenuation in the deep hemispheric and periventricular white matter is nonspecific, but likely reflects chronic microvascular ischemic demyelination. The visualized paranasal sinuses and mastoid air cells are clear.   IMPRESSION: Stable.  No acute intracranial abnormality.  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   Original Report Authenticated By: ERIC A. MANSELL, M.D.     Microbiology: Recent Results (from the past 240 hour(s))  URINE CULTURE     Status: Normal   Collection Time   02/12/12  6:09 PM      Component Value Range Status Comment   Specimen Description URINE, CATHETERIZED   Final    Special Requests ADDED [phone]   Final    Culture  Setup Time 02/12/2012 21:15   Final    Colony Count NO GROWTH   Final    Culture NO GROWTH   Final    Report Status 02/13/2012 FINAL   Final      Labs: Basic Metabolic Panel:  Lab 02/14/12 8295 02/13/12 0535 02/12/12 1745  NA 143 142 140  K 3.9 4.5 4.8  CL 109 110 108  CO2 26 26 25   GLUCOSE 98 104* 99  BUN 18 22 20   CREATININE 1.33* 1.30* 1.26*  CALCIUM 9.9 9.3 9.6  MG -- -- --  PHOS -- -- --   Liver Function Tests:  Lab 02/12/12 1745  AST 14  ALT 9  ALKPHOS 89  BILITOT 0.2*  PROT 6.2  ALBUMIN 3.0*   No results found for this basename: LIPASE:5,AMYLASE:5 in the last 168 hours No results found for this basename: AMMONIA:5 in the last 168 hours CBC:  Lab 02/14/12 0430 02/13/12 0535 02/12/12 1745  WBC 10.4 9.3 9.2  NEUTROABS -- -- 8.2*  HGB 11.1* 10.7* 11.0*  HCT 35.0* 34.0* 34.7*  MCV 94.6 96.0 95.6  PLT 171 181 162   Cardiac Enzymes: No results found for this basename: CKTOTAL:5,CKMB:5,CKMBINDEX:5,TROPONINI:5 in the  last 168 hours BNP: BNP (last 3 results) No results found for this basename: PROBNP:3 in the last 8760 hours CBG: No results found for this basename: GLUCAP:5 in the last 168 hours  Time coordinating discharge: 40 minutes  Signed:  Quanetta Truss A  [facility] 02/14/2012, 11:09 AM

## 2012-02-14 NOTE — Progress Notes (Signed)
Amy Clay [id number] Discharge Data: 02/14/2012 1:14 PM Attending Provider: Clydia Llano, MD WUJ:[id number],WGNF Glo Herring, MD     [facility] Ship to be D/C'd Home per MD order.  Discussed with the patient the After Visit Summary and all questions fully answered. All IV's discontinued with no bleeding noted. All belongings returned to patient for patient to take home.   Last Vital Signs:  Blood pressure 146/89, pulse 123, temperature 98.6 F (37 C), temperature source Oral, resp. rate 16, height 5\' 6"  (1.676 m), weight 96.7 kg (213 lb 3 oz), SpO2 95.00%.  Discharge Medication List   Medication List     As of 02/14/2012  1:14 PM    TAKE these medications         acetaminophen 650 MG CR tablet   Commonly known as: TYLENOL   Take 650 mg by mouth every 8 (eight) hours as needed. For pain      aspirin EC 81 MG tablet   Take 81 mg by mouth every morning.      benztropine 0.5 MG tablet   Commonly known as: COGENTIN   Take 1 tablet (0.5 mg total) by mouth at bedtime.      beta carotene w/minerals tablet   Take 1 tablet by mouth every morning.      Biotin 5000 MCG Caps   Take 1 capsule by mouth every morning.      calcium carbonate 600 MG Tabs   Commonly known as: OS-CAL   Take 600 mg by mouth 2 (two) times daily with a meal.      ESTRACE VAGINAL 0.1 MG/GM vaginal cream   Generic drug: estradiol   Place 2 g vaginally 2 (two) times a week.      lamoTRIgine 25 MG tablet   Commonly known as: LAMICTAL   Take 50 mg by mouth 2 (two) times daily.      losartan 100 MG tablet   Commonly known as: COZAAR   Take 100 mg by mouth every morning.      meloxicam 15 MG tablet   Commonly known as: MOBIC   Take 15 mg by mouth daily as needed. For pain   *Sometimes patient uses Tylenol Arthritis*      omeprazole 20 MG capsule   Commonly known as: PRILOSEC   Take 40 mg by mouth every morning.      PARoxetine 20 MG tablet   Commonly known as: PAXIL   Take 1 tablet (20 mg total) by mouth  every morning.      PARoxetine 40 MG tablet   Commonly known as: PAXIL   Take 1 tablet (40 mg total) by mouth every morning.      risperiDONE 0.5 MG tablet   Commonly known as: RISPERDAL   Take 0.5 mg by mouth daily. Takes with 1 mg tablet at 1:00 pm      risperiDONE 1 MG tablet   Commonly known as: RISPERDAL   Take 1 mg by mouth daily. Takes with 0.5 mg tablet at 1:00 pm      solifenacin 10 MG tablet   Commonly known as: VESICARE   Take 10 mg by mouth every morning.      STOOL SOFTENER 100 MG capsule   Generic drug: docusate sodium   Take 200 mg by mouth at bedtime.      sulfamethoxazole-trimethoprim 800-160 MG per tablet   Commonly known as: BACTRIM DS,SEPTRA DS   Take 1 tablet by mouth 2 (two) times daily.  terazosin 2 MG capsule   Commonly known as: HYTRIN   Take 2 mg by mouth at bedtime. Take with the 1 mg capsule at bedtime      terazosin 1 MG capsule   Commonly known as: HYTRIN   Take 1 mg by mouth at bedtime. Take with the 2 mg capsule at bedtime      vitamin B-12 1000 MCG tablet   Commonly known as: CYANOCOBALAMIN   Take 1,000 mcg by mouth daily.      Vitamin D 2000 UNITS Caps   Take 1 capsule by mouth every morning.        Jceon Alverio, Elmarie Mainland, RN

## 2012-02-17 DIAGNOSIS — IMO0001 Reserved for inherently not codable concepts without codable children: Secondary | ICD-10-CM | POA: Diagnosis not present

## 2012-02-17 DIAGNOSIS — M159 Polyosteoarthritis, unspecified: Secondary | ICD-10-CM | POA: Diagnosis not present

## 2012-02-17 DIAGNOSIS — F329 Major depressive disorder, single episode, unspecified: Secondary | ICD-10-CM | POA: Diagnosis not present

## 2012-02-17 DIAGNOSIS — Z87440 Personal history of urinary (tract) infections: Secondary | ICD-10-CM | POA: Diagnosis not present

## 2012-02-17 DIAGNOSIS — R55 Syncope and collapse: Secondary | ICD-10-CM | POA: Diagnosis not present

## 2012-02-17 DIAGNOSIS — F319 Bipolar disorder, unspecified: Secondary | ICD-10-CM | POA: Diagnosis not present

## 2012-02-19 DIAGNOSIS — F329 Major depressive disorder, single episode, unspecified: Secondary | ICD-10-CM | POA: Diagnosis not present

## 2012-02-19 DIAGNOSIS — Z87440 Personal history of urinary (tract) infections: Secondary | ICD-10-CM | POA: Diagnosis not present

## 2012-02-19 DIAGNOSIS — R55 Syncope and collapse: Secondary | ICD-10-CM | POA: Diagnosis not present

## 2012-02-19 DIAGNOSIS — IMO0001 Reserved for inherently not codable concepts without codable children: Secondary | ICD-10-CM | POA: Diagnosis not present

## 2012-02-19 DIAGNOSIS — M159 Polyosteoarthritis, unspecified: Secondary | ICD-10-CM | POA: Diagnosis not present

## 2012-02-19 DIAGNOSIS — F319 Bipolar disorder, unspecified: Secondary | ICD-10-CM | POA: Diagnosis not present

## 2012-02-20 ENCOUNTER — Encounter (HOSPITAL_COMMUNITY): Payer: Self-pay | Admitting: Psychiatry

## 2012-02-20 ENCOUNTER — Ambulatory Visit (INDEPENDENT_AMBULATORY_CARE_PROVIDER_SITE_OTHER): Payer: Medicare Other | Admitting: Psychiatry

## 2012-02-20 DIAGNOSIS — F411 Generalized anxiety disorder: Secondary | ICD-10-CM

## 2012-02-20 DIAGNOSIS — F331 Major depressive disorder, recurrent, moderate: Secondary | ICD-10-CM

## 2012-02-20 DIAGNOSIS — F323 Major depressive disorder, single episode, severe with psychotic features: Secondary | ICD-10-CM

## 2012-02-20 DIAGNOSIS — F09 Unspecified mental disorder due to known physiological condition: Secondary | ICD-10-CM

## 2012-02-20 MED ORDER — RISPERIDONE 1 MG PO TABS: 1.0000 mg | ORAL_TABLET | Freq: Every day | ORAL | Status: DC

## 2012-02-20 MED ORDER — PAROXETINE HCL 40 MG PO TABS: 40.0000 mg | ORAL_TABLET | ORAL | Status: DC

## 2012-02-20 MED ORDER — PAROXETINE HCL 20 MG PO TABS: 20.0000 mg | ORAL_TABLET | ORAL | Status: DC

## 2012-02-20 MED ORDER — BENZTROPINE MESYLATE 0.5 MG PO TABS: 0.5000 mg | ORAL_TABLET | Freq: Every day | ORAL | Status: DC

## 2012-02-20 MED ORDER — LORAZEPAM 0.5 MG PO TABS: 0.5000 mg | ORAL_TABLET | ORAL | Status: DC | PRN

## 2012-02-20 NOTE — Progress Notes (Signed)
Chief complaint I was fainted in [employer].  Ambulance to the emergency room .  I have UTI.     History of presenting illness Patient is 71 year old Caucasian female who came with her sister for her followup appointment.  2 days ago patient was seen at emergency room when patient fainted.  She was dehydrated and diagnosed with UTI.  She was given antibiotic which she recently finished.  Patient endorse that her psychiatric medication is helping especially Lamictal.  She feels less depressed and less anxious but endorse sometimes feeling dizziness and dry mouth.  She is taking Cogentin which is helping her jerking movements.  Sister also endorse much improvement in her mood and sleep.  Earlier she was admitted at [facility] for decompensation into her illness.  Her Paxil was increased and her Risperdal was increased to 1.5 mg.  Patient continues to have some anxiety and wondering if she can take small dose Ativan.  Patient use to take Ativan 0.5 mg 4 times a day which was discontinued when she was hospitalized at [facility].  Patient denies any agitation anger mood swing but endorse episodes of severe anxiety and nervousness.  She's not drinking or using any illegal substance.  She still complain of dry mouth and sometimes feeling dizziness  Current psychiatric medication Risperdal 1.5 mg at bedtime Paxil 40 mg daily Paxil 20 mg daily Lamictal 25 mg 2 tablet twice a day  Past psychiatric history Patient is seeing psychiatrist since 1997 in this office.  Her last psychiatric admission was at [facility] after having suicidal thoughts.  She denies any history of suicidal attempt however endorse history of hopeless feeling.  She is diagnosed with major depressive disorder. She she was very stable on lithium until her creatinine went up and lithium was discontinued.  We had recently tried Lamictal and the dose has been increased recently.  In the past  we had tried BuSpar which was discontinued at hospital.  She has another psychiatric inpatient treatment in 90s. She admitted history of mania, depression and psychosis.  Social history Patient lives with her sister.  She has no children.  Her husband died in nursing home.  Patient has no other family.  Medical history Patient was recently admitted due to dehydration and UTI.  Patient has history of arthritis blood pressure and chronic pain. Her primary care physician is Dr. Phillips Odor at [facility].  She takes medication for her blood pressure and arthritis.  She had history of jerking movements, fall and generalized pain. Her last blood work was done on 02/14/2012 which shows anemia, her creatinine was 1.33.  She was given antibiotic result UTI.  Her WBC count was 10.4.  Mental status examination Patient is casually dressed and fairly groomed.  She appears tired .  She described her mood is better and her affect is improved .  Her speech is slow and soft but decreased volume and tone.  Her thought processes slow but logical linear and goal-directed.  She has some tremors which could be due to Risperdal.  She uses walker for walking.  She maintained fair eye contact.  She's cooperative in conversation.  He denies any active or passive suicidal thoughts or homicidal thoughts.  She has difficulty remembering things however there were no flight of idea or loose association.  There were no obsession or delusions.  Her attention and memory is distracted.  Her fund of knowledge is average.  She's alert and oriented x3.  Her  attention and concentration is fair.  Her insight judgment and pulse control is okay.  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  I believe she  is having more postural hypotension with Risperdal.  I recommend to decrease Risperdal to only 1 mg at bedtime.  She likes Lamictal which is helping her depression.  I will continue present dose of Lamictal and Paxil.  I explain in detail the risk and benefits of medication and encourage her to hydrate herself .  I recommend to use lorazepam 0.5 mg only for severe anxiety attack.  I explain in detail the risk and benefits of medication especially tolerance, dependence and withdrawal symptoms of benzodiazepine.  I recommend to call us if she is any question or concern about the medication or if he feel worsening of the symptom.  I also explained that there is a possibility she may see a new psychiatrist on her next appointment since I am moving full-time increase her office.  More than 50% of the time spent in counseling and coordination of care.  Followup in 6 weeks.  Portion of this note is generated with voice recognition software and may contain typographical error.

## 2012-02-21 DIAGNOSIS — IMO0001 Reserved for inherently not codable concepts without codable children: Secondary | ICD-10-CM | POA: Diagnosis not present

## 2012-02-21 DIAGNOSIS — F319 Bipolar disorder, unspecified: Secondary | ICD-10-CM | POA: Diagnosis not present

## 2012-02-21 DIAGNOSIS — M159 Polyosteoarthritis, unspecified: Secondary | ICD-10-CM | POA: Diagnosis not present

## 2012-02-21 DIAGNOSIS — Z87440 Personal history of urinary (tract) infections: Secondary | ICD-10-CM | POA: Diagnosis not present

## 2012-02-21 DIAGNOSIS — R55 Syncope and collapse: Secondary | ICD-10-CM | POA: Diagnosis not present

## 2012-02-21 DIAGNOSIS — F329 Major depressive disorder, single episode, unspecified: Secondary | ICD-10-CM | POA: Diagnosis not present

## 2012-02-24 DIAGNOSIS — Z87440 Personal history of urinary (tract) infections: Secondary | ICD-10-CM | POA: Diagnosis not present

## 2012-02-24 DIAGNOSIS — M159 Polyosteoarthritis, unspecified: Secondary | ICD-10-CM | POA: Diagnosis not present

## 2012-02-24 DIAGNOSIS — IMO0001 Reserved for inherently not codable concepts without codable children: Secondary | ICD-10-CM | POA: Diagnosis not present

## 2012-02-24 DIAGNOSIS — F319 Bipolar disorder, unspecified: Secondary | ICD-10-CM | POA: Diagnosis not present

## 2012-02-24 DIAGNOSIS — R55 Syncope and collapse: Secondary | ICD-10-CM | POA: Diagnosis not present

## 2012-02-24 DIAGNOSIS — F329 Major depressive disorder, single episode, unspecified: Secondary | ICD-10-CM | POA: Diagnosis not present

## 2012-02-26 DIAGNOSIS — F319 Bipolar disorder, unspecified: Secondary | ICD-10-CM | POA: Diagnosis not present

## 2012-02-26 DIAGNOSIS — IMO0001 Reserved for inherently not codable concepts without codable children: Secondary | ICD-10-CM | POA: Diagnosis not present

## 2012-02-26 DIAGNOSIS — F329 Major depressive disorder, single episode, unspecified: Secondary | ICD-10-CM | POA: Diagnosis not present

## 2012-02-26 DIAGNOSIS — M159 Polyosteoarthritis, unspecified: Secondary | ICD-10-CM | POA: Diagnosis not present

## 2012-02-26 DIAGNOSIS — R55 Syncope and collapse: Secondary | ICD-10-CM | POA: Diagnosis not present

## 2012-02-26 DIAGNOSIS — Z87440 Personal history of urinary (tract) infections: Secondary | ICD-10-CM | POA: Diagnosis not present

## 2012-02-28 DIAGNOSIS — M159 Polyosteoarthritis, unspecified: Secondary | ICD-10-CM | POA: Diagnosis not present

## 2012-02-28 DIAGNOSIS — IMO0001 Reserved for inherently not codable concepts without codable children: Secondary | ICD-10-CM | POA: Diagnosis not present

## 2012-02-28 DIAGNOSIS — R55 Syncope and collapse: Secondary | ICD-10-CM | POA: Diagnosis not present

## 2012-02-28 DIAGNOSIS — F319 Bipolar disorder, unspecified: Secondary | ICD-10-CM | POA: Diagnosis not present

## 2012-02-28 DIAGNOSIS — Z87440 Personal history of urinary (tract) infections: Secondary | ICD-10-CM | POA: Diagnosis not present

## 2012-02-28 DIAGNOSIS — F329 Major depressive disorder, single episode, unspecified: Secondary | ICD-10-CM | POA: Diagnosis not present

## 2012-03-02 DIAGNOSIS — IMO0001 Reserved for inherently not codable concepts without codable children: Secondary | ICD-10-CM | POA: Diagnosis not present

## 2012-03-02 DIAGNOSIS — F319 Bipolar disorder, unspecified: Secondary | ICD-10-CM | POA: Diagnosis not present

## 2012-03-02 DIAGNOSIS — Z87440 Personal history of urinary (tract) infections: Secondary | ICD-10-CM | POA: Diagnosis not present

## 2012-03-02 DIAGNOSIS — M159 Polyosteoarthritis, unspecified: Secondary | ICD-10-CM | POA: Diagnosis not present

## 2012-03-02 DIAGNOSIS — R55 Syncope and collapse: Secondary | ICD-10-CM | POA: Diagnosis not present

## 2012-03-02 DIAGNOSIS — F329 Major depressive disorder, single episode, unspecified: Secondary | ICD-10-CM | POA: Diagnosis not present

## 2012-03-03 ENCOUNTER — Other Ambulatory Visit (HOSPITAL_COMMUNITY): Payer: Self-pay | Admitting: Psychiatry

## 2012-03-03 ENCOUNTER — Telehealth (HOSPITAL_COMMUNITY): Payer: Self-pay | Admitting: *Deleted

## 2012-03-03 NOTE — Telephone Encounter (Signed)
Spoke to sister.  Who endorse that patient was feeling down on the weekend since we cut down Risperdal to 1 mg at bedtime.  Explained that Risperdal was causing postural hypertension and patient was feeling very dizzy with higher dose.  This was the reason we reduced the Risperdal 1 mg at bedtime.  I recommend continue that dose and do not take additional doses and watch carefully her mood symptoms.  If she continued to feel depressed than call us back.

## 2012-03-04 DIAGNOSIS — Z87440 Personal history of urinary (tract) infections: Secondary | ICD-10-CM | POA: Diagnosis not present

## 2012-03-04 DIAGNOSIS — F319 Bipolar disorder, unspecified: Secondary | ICD-10-CM | POA: Diagnosis not present

## 2012-03-04 DIAGNOSIS — IMO0001 Reserved for inherently not codable concepts without codable children: Secondary | ICD-10-CM | POA: Diagnosis not present

## 2012-03-04 DIAGNOSIS — R55 Syncope and collapse: Secondary | ICD-10-CM | POA: Diagnosis not present

## 2012-03-04 DIAGNOSIS — M159 Polyosteoarthritis, unspecified: Secondary | ICD-10-CM | POA: Diagnosis not present

## 2012-03-04 DIAGNOSIS — F329 Major depressive disorder, single episode, unspecified: Secondary | ICD-10-CM | POA: Diagnosis not present

## 2012-03-09 DIAGNOSIS — R55 Syncope and collapse: Secondary | ICD-10-CM | POA: Diagnosis not present

## 2012-03-09 DIAGNOSIS — M159 Polyosteoarthritis, unspecified: Secondary | ICD-10-CM | POA: Diagnosis not present

## 2012-03-09 DIAGNOSIS — F329 Major depressive disorder, single episode, unspecified: Secondary | ICD-10-CM | POA: Diagnosis not present

## 2012-03-09 DIAGNOSIS — IMO0001 Reserved for inherently not codable concepts without codable children: Secondary | ICD-10-CM | POA: Diagnosis not present

## 2012-03-09 DIAGNOSIS — F319 Bipolar disorder, unspecified: Secondary | ICD-10-CM | POA: Diagnosis not present

## 2012-03-09 DIAGNOSIS — Z87440 Personal history of urinary (tract) infections: Secondary | ICD-10-CM | POA: Diagnosis not present

## 2012-03-13 DIAGNOSIS — Z87440 Personal history of urinary (tract) infections: Secondary | ICD-10-CM | POA: Diagnosis not present

## 2012-03-13 DIAGNOSIS — IMO0001 Reserved for inherently not codable concepts without codable children: Secondary | ICD-10-CM | POA: Diagnosis not present

## 2012-03-13 DIAGNOSIS — R55 Syncope and collapse: Secondary | ICD-10-CM | POA: Diagnosis not present

## 2012-03-13 DIAGNOSIS — M159 Polyosteoarthritis, unspecified: Secondary | ICD-10-CM | POA: Diagnosis not present

## 2012-03-13 DIAGNOSIS — F329 Major depressive disorder, single episode, unspecified: Secondary | ICD-10-CM | POA: Diagnosis not present

## 2012-03-13 DIAGNOSIS — F319 Bipolar disorder, unspecified: Secondary | ICD-10-CM | POA: Diagnosis not present

## 2012-03-17 DIAGNOSIS — M159 Polyosteoarthritis, unspecified: Secondary | ICD-10-CM | POA: Diagnosis not present

## 2012-03-17 DIAGNOSIS — F329 Major depressive disorder, single episode, unspecified: Secondary | ICD-10-CM | POA: Diagnosis not present

## 2012-03-17 DIAGNOSIS — F319 Bipolar disorder, unspecified: Secondary | ICD-10-CM | POA: Diagnosis not present

## 2012-03-17 DIAGNOSIS — Z87440 Personal history of urinary (tract) infections: Secondary | ICD-10-CM | POA: Diagnosis not present

## 2012-03-17 DIAGNOSIS — R55 Syncope and collapse: Secondary | ICD-10-CM | POA: Diagnosis not present

## 2012-03-17 DIAGNOSIS — IMO0001 Reserved for inherently not codable concepts without codable children: Secondary | ICD-10-CM | POA: Diagnosis not present

## 2012-03-25 DIAGNOSIS — F319 Bipolar disorder, unspecified: Secondary | ICD-10-CM | POA: Diagnosis not present

## 2012-03-25 DIAGNOSIS — Z87440 Personal history of urinary (tract) infections: Secondary | ICD-10-CM | POA: Diagnosis not present

## 2012-03-25 DIAGNOSIS — F329 Major depressive disorder, single episode, unspecified: Secondary | ICD-10-CM | POA: Diagnosis not present

## 2012-03-25 DIAGNOSIS — IMO0001 Reserved for inherently not codable concepts without codable children: Secondary | ICD-10-CM | POA: Diagnosis not present

## 2012-03-25 DIAGNOSIS — M159 Polyosteoarthritis, unspecified: Secondary | ICD-10-CM | POA: Diagnosis not present

## 2012-03-25 DIAGNOSIS — R55 Syncope and collapse: Secondary | ICD-10-CM | POA: Diagnosis not present

## 2012-04-02 ENCOUNTER — Encounter (HOSPITAL_COMMUNITY): Payer: Self-pay | Admitting: Psychiatry

## 2012-04-02 ENCOUNTER — Ambulatory Visit (INDEPENDENT_AMBULATORY_CARE_PROVIDER_SITE_OTHER): Payer: Medicare Other | Admitting: Psychiatry

## 2012-04-02 VITALS — BP 112/74 | HR 64 | Ht 64.5 in | Wt 199.2 lb

## 2012-04-02 DIAGNOSIS — IMO0001 Reserved for inherently not codable concepts without codable children: Secondary | ICD-10-CM | POA: Diagnosis not present

## 2012-04-02 DIAGNOSIS — F5105 Insomnia due to other mental disorder: Secondary | ICD-10-CM | POA: Insufficient documentation

## 2012-04-02 DIAGNOSIS — F319 Bipolar disorder, unspecified: Secondary | ICD-10-CM | POA: Diagnosis not present

## 2012-04-02 DIAGNOSIS — R55 Syncope and collapse: Secondary | ICD-10-CM | POA: Diagnosis not present

## 2012-04-02 DIAGNOSIS — Z87440 Personal history of urinary (tract) infections: Secondary | ICD-10-CM | POA: Diagnosis not present

## 2012-04-02 DIAGNOSIS — F09 Unspecified mental disorder due to known physiological condition: Secondary | ICD-10-CM

## 2012-04-02 DIAGNOSIS — F329 Major depressive disorder, single episode, unspecified: Secondary | ICD-10-CM

## 2012-04-02 DIAGNOSIS — F411 Generalized anxiety disorder: Secondary | ICD-10-CM | POA: Diagnosis not present

## 2012-04-02 DIAGNOSIS — F323 Major depressive disorder, single episode, severe with psychotic features: Secondary | ICD-10-CM | POA: Diagnosis not present

## 2012-04-02 DIAGNOSIS — M159 Polyosteoarthritis, unspecified: Secondary | ICD-10-CM | POA: Diagnosis not present

## 2012-04-02 DIAGNOSIS — F331 Major depressive disorder, recurrent, moderate: Secondary | ICD-10-CM

## 2012-04-02 MED ORDER — PAROXETINE HCL 40 MG PO TABS: 40.0000 mg | ORAL_TABLET | ORAL | Status: DC

## 2012-04-02 MED ORDER — RISPERIDONE 0.25 MG PO TABS: ORAL_TABLET | ORAL | Status: DC

## 2012-04-02 MED ORDER — RISPERIDONE 1 MG PO TABS: 1.0000 mg | ORAL_TABLET | Freq: Every day | ORAL | Status: DC

## 2012-04-02 MED ORDER — PAROXETINE HCL 20 MG PO TABS: 20.0000 mg | ORAL_TABLET | ORAL | Status: DC

## 2012-04-02 MED ORDER — BENZTROPINE MESYLATE 0.5 MG PO TABS: 0.5000 mg | ORAL_TABLET | Freq: Every day | ORAL | Status: DC

## 2012-04-02 MED ORDER — LAMOTRIGINE 25 MG PO TABS: 50.0000 mg | ORAL_TABLET | Freq: Two times a day (BID) | ORAL | Status: DC

## 2012-04-02 NOTE — Progress Notes (Signed)
Chief complaint Follow-up and medication refill  History of presenting illness Patient is 71 year old Caucasian female who came with her sister for her followup appointment.  Pt was admitted to [facility] and was stopped from the Lithium due to the effect on her kidneys.  She has suicidal thoughts then.  No suicidal thoughts since then.  She has been adjusted on Paxil 60mg  and Lamictal 50 BID as well as Risperdal 1 mg at night.  She was taken off the Ativan in the hospital and noted improvement in her memory.  Now back on the Ativan she notes memory problems.  Will mark as an allergy to benzodiazepines.   She has a great fear of falling and holds on the side of the table during the entire meal from her fear of falling.  She felt as if she were going to faint and after telling her sister that on a shopping trip did faint.  She has felt faint and fallen several times since.  B/P today good.  It had been high before.  She was dehydrated for the first episode.  She has jerking muscle movements that predated the Risperdal.  She has very faint cogwheel rigidity of both wrists and her (R) elbow.  The sister insists that the muscle jerking started before the Risperdal and has not been made worse by the Risperdal.   Current psychiatric medication Risperdal 1.0 mg at bedtime Paxil 40 mg daily Paxil 20 mg daily Lamictal 25 mg 2 tablet twice a day Ativan which causes memory problems.  Past psychiatric history Patient is seeing psychiatrist since 1997 in this office.  Her last psychiatric admission was at [facility] after having suicidal thoughts.  She denies any history of suicidal attempt however endorse history of hopeless feeling.  She is diagnosed with major depressive disorder. She she was very stable on lithium until her creatinine went up and lithium was discontinued.  We had recently tried Lamictal and the dose has been increased recently.  In the past we had tried BuSpar which  was discontinued at hospital.  She has another psychiatric inpatient treatment in 90s. She admitted history of mania, depression and psychosis.  Social history Patient lives with her sister.  She has no children.  Her husband died in nursing home.  Patient has no other family.  Medical history Patient was recently admitted due to dehydration and UTI.  Patient has history of arthritis blood pressure and chronic pain. Her primary care physician is Dr. Phillips Odor at [facility].  She takes medication for her blood pressure and arthritis.  She had history of jerking movements, fall and generalized pain. Her last blood work was done on 02/14/2012 which shows anemia, her creatinine was 1.33.  She was given antibiotic result UTI.  Her WBC count was 10.4.  Mental status examination Patient is casually dressed and fairly groomed.  She appears tired .  She described her mood is better and her affect is improved .  Her speech is slow and soft but decreased volume and tone.  Her thought processes slow but logical linear and goal-directed.  She has some tremors which could be due to Risperdal.  She uses Makenize Messman for walking.  She maintained fair eye contact.  She's cooperative in conversation.  He denies any active or passive suicidal thoughts or homicidal thoughts.  She has difficulty remembering things however there were no flight of idea or loose association.  There were no obsession or delusions.  Her attention and memory  is distracted.  Her fund of knowledge is average.  She's alert and oriented x3.  Her attention and concentration is fair.  Her insight judgment and pulse control is okay.  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  Axis II deferred Axis III see medical history Axis IV mild to moderate Axis V 5-60  Plan I took her weight and height.  I reviewed CC, tobacco/med/surg Hx, meds effects/ side effects,  problem list, therapies and responses as well as current situation and options for them.    Her memory is made worse by the Ativan.  Will try increasing the Risperdal in the afternoons to see if that makes the anxiety better withoutcausing the jerking to be worse.

## 2012-04-07 DIAGNOSIS — Z87440 Personal history of urinary (tract) infections: Secondary | ICD-10-CM | POA: Diagnosis not present

## 2012-04-07 DIAGNOSIS — IMO0001 Reserved for inherently not codable concepts without codable children: Secondary | ICD-10-CM | POA: Diagnosis not present

## 2012-04-07 DIAGNOSIS — M159 Polyosteoarthritis, unspecified: Secondary | ICD-10-CM | POA: Diagnosis not present

## 2012-04-07 DIAGNOSIS — F319 Bipolar disorder, unspecified: Secondary | ICD-10-CM | POA: Diagnosis not present

## 2012-04-07 DIAGNOSIS — F329 Major depressive disorder, single episode, unspecified: Secondary | ICD-10-CM | POA: Diagnosis not present

## 2012-04-07 DIAGNOSIS — R55 Syncope and collapse: Secondary | ICD-10-CM | POA: Diagnosis not present

## 2012-04-15 DIAGNOSIS — F319 Bipolar disorder, unspecified: Secondary | ICD-10-CM | POA: Diagnosis not present

## 2012-04-15 DIAGNOSIS — Z87440 Personal history of urinary (tract) infections: Secondary | ICD-10-CM | POA: Diagnosis not present

## 2012-04-15 DIAGNOSIS — M159 Polyosteoarthritis, unspecified: Secondary | ICD-10-CM | POA: Diagnosis not present

## 2012-04-15 DIAGNOSIS — R55 Syncope and collapse: Secondary | ICD-10-CM | POA: Diagnosis not present

## 2012-04-15 DIAGNOSIS — IMO0001 Reserved for inherently not codable concepts without codable children: Secondary | ICD-10-CM | POA: Diagnosis not present

## 2012-04-15 DIAGNOSIS — F329 Major depressive disorder, single episode, unspecified: Secondary | ICD-10-CM | POA: Diagnosis not present

## 2012-04-27 DIAGNOSIS — L84 Corns and callosities: Secondary | ICD-10-CM | POA: Diagnosis not present

## 2012-04-27 DIAGNOSIS — B351 Tinea unguium: Secondary | ICD-10-CM | POA: Diagnosis not present

## 2012-04-27 DIAGNOSIS — M79609 Pain in unspecified limb: Secondary | ICD-10-CM | POA: Diagnosis not present

## 2012-04-30 ENCOUNTER — Ambulatory Visit (INDEPENDENT_AMBULATORY_CARE_PROVIDER_SITE_OTHER): Payer: Medicare Other | Admitting: Psychiatry

## 2012-04-30 ENCOUNTER — Encounter (HOSPITAL_COMMUNITY): Payer: Self-pay | Admitting: Psychiatry

## 2012-04-30 VITALS — Wt 202.2 lb

## 2012-04-30 DIAGNOSIS — F09 Unspecified mental disorder due to known physiological condition: Secondary | ICD-10-CM

## 2012-04-30 DIAGNOSIS — F323 Major depressive disorder, single episode, severe with psychotic features: Secondary | ICD-10-CM | POA: Diagnosis not present

## 2012-04-30 DIAGNOSIS — F411 Generalized anxiety disorder: Secondary | ICD-10-CM | POA: Diagnosis not present

## 2012-04-30 DIAGNOSIS — F5105 Insomnia due to other mental disorder: Secondary | ICD-10-CM

## 2012-04-30 DIAGNOSIS — F329 Major depressive disorder, single episode, unspecified: Secondary | ICD-10-CM

## 2012-04-30 DIAGNOSIS — F331 Major depressive disorder, recurrent, moderate: Secondary | ICD-10-CM

## 2012-04-30 MED ORDER — BENZTROPINE MESYLATE 0.5 MG PO TABS: 0.5000 mg | ORAL_TABLET | Freq: Every day | ORAL | Status: DC

## 2012-04-30 MED ORDER — LAMOTRIGINE 25 MG PO TABS: 50.0000 mg | ORAL_TABLET | Freq: Two times a day (BID) | ORAL | Status: DC

## 2012-04-30 MED ORDER — RISPERIDONE 1 MG PO TABS: 1.0000 mg | ORAL_TABLET | Freq: Every day | ORAL | Status: DC

## 2012-04-30 MED ORDER — VITAMIN E 180 MG (400 UNIT) PO CAPS: 400.0000 [IU] | ORAL_CAPSULE | Freq: Two times a day (BID) | ORAL | Status: DC

## 2012-04-30 MED ORDER — RISPERIDONE 0.25 MG PO TABS: ORAL_TABLET | ORAL | Status: DC

## 2012-04-30 MED ORDER — PAROXETINE HCL 40 MG PO TABS: 40.0000 mg | ORAL_TABLET | ORAL | Status: DC

## 2012-04-30 MED ORDER — PAROXETINE HCL 20 MG PO TABS: 20.0000 mg | ORAL_TABLET | ORAL | Status: DC

## 2012-04-30 MED ORDER — BENZTROPINE MESYLATE 0.5 MG PO TABS: 0.5000 mg | ORAL_TABLET | Freq: Two times a day (BID) | ORAL | Status: DC

## 2012-04-30 NOTE — Patient Instructions (Addendum)
The Risperdal may cause weight gain by turning on the drive for eating sugar and sweets.  Try focusing calorie intake on PROTEIN and FAT, NOT carbs.  Try cutting the Risperdal to ONE of the 0.25 mg twice a day.  Increasing the Cogentin and Vitamin E is for the muscle jerking and the stiffness.   Have a happy holiday.

## 2012-04-30 NOTE — Progress Notes (Signed)
Chief complaint Chief Complaint  Patient presents with  . Depression  . Follow-up  . Medication Refill   Subjective: "I think I'm doing better the last two weeks (since the sister and cousin added the second dose of Risperdal in the AM)".  History of presenting illness Patient is 71 year old Caucasian female who came with her sister for her followup appointment.  Pt reports that she is compliant with the psychotropic medications with improved benefit and no noticible side effects.  She still has some memory lapses.  She has gained some weight.  Will encoruage the family to only use ONE of the Risperdal 0.25 mg BID.  The pt and sister note some muscle jerking.  There is some cog wheel rigidity on both wrists and both elbows.  Will increase Cogentin for that. There is some retraction, but no fasciculation noted on the tongue.  Will add Vitamin E to see if that helps.   Current psychiatric medication Risperdal 1.0 mg at bedtime and 0.5mg  BID Paxil 40 mg daily in AM Paxil 20 mg daily in AM Lamictal 25 mg 2 tablet twice a day Off Ativan  Past psychiatric history Patient is seeing psychiatrist since 1997 in this office.  Her last psychiatric admission was at [facility] after having suicidal thoughts.  She denies any history of suicidal attempt however endorse history of hopeless feeling.  She is diagnosed with major depressive disorder. She she was very stable on lithium until her creatinine went up and lithium was discontinued.  We had recently tried Lamictal and the dose has been increased recently.  In the past we had tried BuSpar which was discontinued at hospital.  She has another psychiatric inpatient treatment in 90s. She admitted history of mania, depression and psychosis.  Social history Patient lives with her sister.  She has no children.  Her husband died in nursing home.  Patient has no other family.  Medical history Patient was recently admitted due to dehydration  and UTI.  Patient has history of arthritis blood pressure and chronic pain. Her primary care physician is Dr. Phillips Odor at [facility].  She takes medication for her blood pressure and arthritis.  She had history of jerking movements, fall and generalized pain. Her last blood work was done on 02/14/2012 which shows anemia, her creatinine was 1.33.  She was given antibiotic result UTI.  Her WBC count was 10.4.  Mental status examination Patient is casually dressed and fairly groomed.  She appears tired .  She described her mood is better and her affect is improved .  Her speech is slow and soft but decreased volume and tone.  Her thought processes slow but logical linear and goal-directed.  She has some tremors which could be due to Risperdal.  She uses Nyilah Kight for walking.  She maintained fair eye contact.  She's cooperative in conversation.  He denies any active or passive suicidal thoughts or homicidal thoughts.  She has difficulty remembering things however there were no flight of idea or loose association.  There were no obsession or delusions.  Her attention and memory is distracted.  Her fund of knowledge is average.  She's alert and oriented x3.  Her attention and concentration is fair.  Her insight judgment and pulse control is okay.  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  Axis II deferred Axis III see medical history Axis IV mild to moderate Axis V 5-60  Plan I took  her vitals.  I reviewed CC, tobacco/med/surg Hx, meds effects/ side effects, problem list, therapies and responses as well as current situation/symptoms discussed options. See orders and pt instructions for more details.

## 2012-05-11 DIAGNOSIS — L02619 Cutaneous abscess of unspecified foot: Secondary | ICD-10-CM | POA: Diagnosis not present

## 2012-05-19 DIAGNOSIS — Z6832 Body mass index (BMI) 32.0-32.9, adult: Secondary | ICD-10-CM | POA: Diagnosis not present

## 2012-05-19 DIAGNOSIS — Z7182 Exercise counseling: Secondary | ICD-10-CM | POA: Diagnosis not present

## 2012-05-19 DIAGNOSIS — R609 Edema, unspecified: Secondary | ICD-10-CM | POA: Diagnosis not present

## 2012-05-19 DIAGNOSIS — Z713 Dietary counseling and surveillance: Secondary | ICD-10-CM | POA: Diagnosis not present

## 2012-05-28 DIAGNOSIS — L0291 Cutaneous abscess, unspecified: Secondary | ICD-10-CM | POA: Diagnosis not present

## 2012-05-28 DIAGNOSIS — L0390 Cellulitis, unspecified: Secondary | ICD-10-CM | POA: Diagnosis not present

## 2012-05-28 DIAGNOSIS — Z6833 Body mass index (BMI) 33.0-33.9, adult: Secondary | ICD-10-CM | POA: Diagnosis not present

## 2012-05-28 DIAGNOSIS — R7301 Impaired fasting glucose: Secondary | ICD-10-CM | POA: Diagnosis not present

## 2012-06-01 ENCOUNTER — Ambulatory Visit (HOSPITAL_COMMUNITY)
Admission: RE | Admit: 2012-06-01 | Discharge: 2012-06-01 | Disposition: A | Discharge status: Home / Self Care | Payer: Medicare Other | Source: Ambulatory Visit | Attending: Family Medicine | Admitting: Family Medicine

## 2012-06-01 DIAGNOSIS — IMO0001 Reserved for inherently not codable concepts without codable children: Secondary | ICD-10-CM | POA: Insufficient documentation

## 2012-06-01 DIAGNOSIS — IMO0002 Reserved for concepts with insufficient information to code with codable children: Secondary | ICD-10-CM | POA: Insufficient documentation

## 2012-06-02 NOTE — Progress Notes (Signed)
Phyiscal Therapy Evaluation  Patient Details  Name: CAROLYNA YERIAN MRN: [id number] Date of Birth: 1940/09/10  Today's Date: 06/02/2012 Time: 4098-1191  Time: 25 minutes Charges: 1 eval Visit#: 1 Re-eval:    Pt comes in today for evaluation of foot wound.  However due to significant dementia and nervousness she would not sit down to be evaluated.  After 25 minutes of discussion of wound care and attempts to make the patient comfortable enough to sit, she continued to decline to sit down (normal chair w/handrails, with and without pillows; elevated mat and with A).   Discussed in great detail about wound care to pt sister Lucendia Herrlich) who was present and notably upset at the situation due to her difficulty treating to wound b/c of her own health factors.  Discussed using ointment with regular gauze and changing 1x/day or 1x/every other day to let the wound heal.  Pt checked her brakes to her rollator multiple times and would not let go of her rollator with both hands, only takes one hand off at a time and gets notably anxious when asked to take both hands off.    At this time contacted Dr. Beatrice Lecher office to refer pt to [facility] nursing wound care so that pt will be comfortable in her own home are receive care that she needs without anxiousness.    [facility] PT THERAPY OTHER OT PT DISCHARGE STATUS [id number] [facility] PT THERAPY OTHER OT PT GOAL STATUS [id number]  [facility] PT THERAPY OTHER OT PT CURRENT STATUS [id number] GP, CK   Jaquavius Hudler, PT 06/02/2012, 4:21 PM

## 2012-06-03 DIAGNOSIS — F411 Generalized anxiety disorder: Secondary | ICD-10-CM | POA: Diagnosis not present

## 2012-06-03 DIAGNOSIS — H3530 Unspecified macular degeneration: Secondary | ICD-10-CM | POA: Diagnosis not present

## 2012-06-03 DIAGNOSIS — I10 Essential (primary) hypertension: Secondary | ICD-10-CM | POA: Diagnosis not present

## 2012-06-03 DIAGNOSIS — F329 Major depressive disorder, single episode, unspecified: Secondary | ICD-10-CM | POA: Diagnosis not present

## 2012-06-03 DIAGNOSIS — Z9181 History of falling: Secondary | ICD-10-CM | POA: Diagnosis not present

## 2012-06-03 DIAGNOSIS — Z8781 Personal history of (healed) traumatic fracture: Secondary | ICD-10-CM | POA: Diagnosis not present

## 2012-06-03 DIAGNOSIS — L84 Corns and callosities: Secondary | ICD-10-CM | POA: Diagnosis not present

## 2012-06-03 DIAGNOSIS — H543 Unqualified visual loss, both eyes: Secondary | ICD-10-CM | POA: Diagnosis not present

## 2012-06-05 DIAGNOSIS — F411 Generalized anxiety disorder: Secondary | ICD-10-CM | POA: Diagnosis not present

## 2012-06-05 DIAGNOSIS — F329 Major depressive disorder, single episode, unspecified: Secondary | ICD-10-CM | POA: Diagnosis not present

## 2012-06-05 DIAGNOSIS — H543 Unqualified visual loss, both eyes: Secondary | ICD-10-CM | POA: Diagnosis not present

## 2012-06-05 DIAGNOSIS — L84 Corns and callosities: Secondary | ICD-10-CM | POA: Diagnosis not present

## 2012-06-05 DIAGNOSIS — H3530 Unspecified macular degeneration: Secondary | ICD-10-CM | POA: Diagnosis not present

## 2012-06-05 DIAGNOSIS — I10 Essential (primary) hypertension: Secondary | ICD-10-CM | POA: Diagnosis not present

## 2012-06-11 DIAGNOSIS — L84 Corns and callosities: Secondary | ICD-10-CM | POA: Diagnosis not present

## 2012-06-12 DIAGNOSIS — H543 Unqualified visual loss, both eyes: Secondary | ICD-10-CM | POA: Diagnosis not present

## 2012-06-12 DIAGNOSIS — I10 Essential (primary) hypertension: Secondary | ICD-10-CM | POA: Diagnosis not present

## 2012-06-12 DIAGNOSIS — F329 Major depressive disorder, single episode, unspecified: Secondary | ICD-10-CM | POA: Diagnosis not present

## 2012-06-12 DIAGNOSIS — L84 Corns and callosities: Secondary | ICD-10-CM | POA: Diagnosis not present

## 2012-06-12 DIAGNOSIS — F411 Generalized anxiety disorder: Secondary | ICD-10-CM | POA: Diagnosis not present

## 2012-06-12 DIAGNOSIS — H3530 Unspecified macular degeneration: Secondary | ICD-10-CM | POA: Diagnosis not present

## 2012-06-15 DIAGNOSIS — H3530 Unspecified macular degeneration: Secondary | ICD-10-CM | POA: Diagnosis not present

## 2012-06-15 DIAGNOSIS — F329 Major depressive disorder, single episode, unspecified: Secondary | ICD-10-CM | POA: Diagnosis not present

## 2012-06-15 DIAGNOSIS — L84 Corns and callosities: Secondary | ICD-10-CM | POA: Diagnosis not present

## 2012-06-15 DIAGNOSIS — H543 Unqualified visual loss, both eyes: Secondary | ICD-10-CM | POA: Diagnosis not present

## 2012-06-15 DIAGNOSIS — I10 Essential (primary) hypertension: Secondary | ICD-10-CM | POA: Diagnosis not present

## 2012-06-15 DIAGNOSIS — F411 Generalized anxiety disorder: Secondary | ICD-10-CM | POA: Diagnosis not present

## 2012-06-16 DIAGNOSIS — N318 Other neuromuscular dysfunction of bladder: Secondary | ICD-10-CM | POA: Diagnosis not present

## 2012-06-18 DIAGNOSIS — H3530 Unspecified macular degeneration: Secondary | ICD-10-CM | POA: Diagnosis not present

## 2012-06-18 DIAGNOSIS — L84 Corns and callosities: Secondary | ICD-10-CM | POA: Diagnosis not present

## 2012-06-18 DIAGNOSIS — F411 Generalized anxiety disorder: Secondary | ICD-10-CM | POA: Diagnosis not present

## 2012-06-22 DIAGNOSIS — F411 Generalized anxiety disorder: Secondary | ICD-10-CM | POA: Diagnosis not present

## 2012-06-22 DIAGNOSIS — F329 Major depressive disorder, single episode, unspecified: Secondary | ICD-10-CM | POA: Diagnosis not present

## 2012-06-22 DIAGNOSIS — L84 Corns and callosities: Secondary | ICD-10-CM | POA: Diagnosis not present

## 2012-06-22 DIAGNOSIS — I10 Essential (primary) hypertension: Secondary | ICD-10-CM | POA: Diagnosis not present

## 2012-06-22 DIAGNOSIS — H3530 Unspecified macular degeneration: Secondary | ICD-10-CM | POA: Diagnosis not present

## 2012-06-22 DIAGNOSIS — H543 Unqualified visual loss, both eyes: Secondary | ICD-10-CM | POA: Diagnosis not present

## 2012-06-30 DIAGNOSIS — H543 Unqualified visual loss, both eyes: Secondary | ICD-10-CM | POA: Diagnosis not present

## 2012-06-30 DIAGNOSIS — H3530 Unspecified macular degeneration: Secondary | ICD-10-CM | POA: Diagnosis not present

## 2012-06-30 DIAGNOSIS — I10 Essential (primary) hypertension: Secondary | ICD-10-CM | POA: Diagnosis not present

## 2012-06-30 DIAGNOSIS — F329 Major depressive disorder, single episode, unspecified: Secondary | ICD-10-CM | POA: Diagnosis not present

## 2012-06-30 DIAGNOSIS — L84 Corns and callosities: Secondary | ICD-10-CM | POA: Diagnosis not present

## 2012-06-30 DIAGNOSIS — F411 Generalized anxiety disorder: Secondary | ICD-10-CM | POA: Diagnosis not present

## 2012-07-02 ENCOUNTER — Ambulatory Visit (INDEPENDENT_AMBULATORY_CARE_PROVIDER_SITE_OTHER): Payer: Medicare Other | Admitting: Psychiatry

## 2012-07-02 ENCOUNTER — Encounter (HOSPITAL_COMMUNITY): Payer: Self-pay | Admitting: Psychiatry

## 2012-07-02 VITALS — Wt 203.4 lb

## 2012-07-02 DIAGNOSIS — F411 Generalized anxiety disorder: Secondary | ICD-10-CM

## 2012-07-02 DIAGNOSIS — F5105 Insomnia due to other mental disorder: Secondary | ICD-10-CM

## 2012-07-02 DIAGNOSIS — F331 Major depressive disorder, recurrent, moderate: Secondary | ICD-10-CM

## 2012-07-02 DIAGNOSIS — F329 Major depressive disorder, single episode, unspecified: Secondary | ICD-10-CM | POA: Diagnosis not present

## 2012-07-02 DIAGNOSIS — Z79899 Other long term (current) drug therapy: Secondary | ICD-10-CM

## 2012-07-02 MED ORDER — PAROXETINE HCL 20 MG PO TABS: 20.0000 mg | ORAL_TABLET | ORAL | Status: DC

## 2012-07-02 MED ORDER — BENZTROPINE MESYLATE 0.5 MG PO TABS: 0.5000 mg | ORAL_TABLET | Freq: Two times a day (BID) | ORAL | Status: DC

## 2012-07-02 MED ORDER — VITAMIN E 180 MG (400 UNIT) PO CAPS: 400.0000 [IU] | ORAL_CAPSULE | Freq: Two times a day (BID) | ORAL | Status: DC

## 2012-07-02 MED ORDER — PAROXETINE HCL 40 MG PO TABS: 40.0000 mg | ORAL_TABLET | ORAL | Status: DC

## 2012-07-02 MED ORDER — RISPERIDONE 1 MG PO TABS: 1.0000 mg | ORAL_TABLET | Freq: Every day | ORAL | Status: DC

## 2012-07-02 MED ORDER — RISPERIDONE 0.25 MG PO TABS: ORAL_TABLET | ORAL | Status: DC

## 2012-07-02 MED ORDER — LAMOTRIGINE 25 MG PO TABS: 75.0000 mg | ORAL_TABLET | Freq: Two times a day (BID) | ORAL | Status: DC

## 2012-07-02 NOTE — Progress Notes (Addendum)
[facility] [address] Progress Note Amy Clay MRN: [id number] DOB: [birth year] Age: 72 y.o.  Date: 07/02/2012 Start Time: 1:10 PM End Time: 1:30 PM  Chief Complaint: Chief Complaint  Patient presents with  . Depression  . Follow-up  . Medication Refill   Subjective: "I  Don't feel like doing anything.  I fell yesterday". Depression 5/10 and Anxiety 6/10, where 1 is the best and 10 is the worst.  Pain is 1/10  History of presenting illness Patient is 72 year old Caucasian female who came with her sister for her followup appointment.  Pt reports that she is compliant with the psychotropic medications with fair benefit and no noticeable side effects.  Her tongue looks better with the Vitamin E.   Current psychiatric medication Risperdal 1.0 mg at bedtime and 0.5mg  BID Paxil 40 mg daily in AM Paxil 20 mg daily in AM Lamictal 50 mg twice a day Vitamin E 400 IU each AM Off Ativan  Past psychiatric history Patient is seeing psychiatrist since 1997 in this office.  Her last psychiatric admission was at [facility] after having suicidal thoughts.  She denies any history of suicidal attempt however endorse history of hopeless feeling.  She is diagnosed with major depressive disorder. She she was very stable on lithium until her creatinine went up and lithium was discontinued.  We had recently tried Lamictal and the dose has been increased recently.  In the past we had tried BuSpar which was discontinued at hospital.  She has another psychiatric inpatient treatment in 90s. She admitted history of mania, depression and psychosis.  Social history Patient lives with her sister.  She has no children.  Her husband died in nursing home.  Patient has no other family.  Medical history Patient was recently admitted due to dehydration and UTI.  Patient has history of arthritis blood pressure and chronic pain. Her primary care physician is Dr. Phillips Odor at [facility].  She takes medication for her blood pressure and arthritis.  She had history of jerking movements, fall and generalized pain. Her last blood work was done on 02/14/2012 which shows anemia, her creatinine was 1.33.  She was given antibiotic result UTI.  Her WBC count was 10.4.  Family History family history includes Anxiety disorder in her paternal aunt; Arthritis in an unspecified family member; Asthma in an unspecified family member; and Diabetes in an unspecified family member.  There is no history of Colon cancer.  Mental status examination Patient is casually dressed and fairly groomed.  She appears tired .  She described her mood is better and her affect is improved .  Her speech is slow and soft but decreased volume and tone.  Her thought processes slow but logical linear and goal-directed.  She has some tremors which could be due to Risperdal.  She uses Dam[employer]ra Luscher for walking.  She maintained fair eye contact.  She's cooperative in conversation.  He denies any active or passive suicidal thoughts or homicidal thoughts.  She has difficulty remembering things however there were no flight of idea or loose association.  There were no obsession or delusions.  Her attention and memory is distracted.  Her fund of knowledge is average.  She's alert and oriented x3.  Her attention and concentration is fair.  Her insight judgment and pulse control is okay.  Lab Results:  Results for orders placed during the hospital encounter of 02/12/12 (from the past 8736 hour(s))  CBC WITH DIFFERENTIAL   Collection Time  02/12/12  5:45 PM      Result Value Range   WBC 9.2  4.0 - 10.5 K/uL   RBC 3.63 (*) 3.87 - 5.11 MIL/uL   Hemoglobin 11.0 (*) 12.0 - 15.0 g/dL   HCT 04.5 (*) 40.9 - 81.1 %   MCV 95.6  78.0 - 100.0 fL   MCH 30.3  26.0 - 34.0 pg   MCHC 31.7  30.0 - 36.0 g/dL   RDW 91.4  78.2 - 95.6 %   Platelets 162  150 - 400 K/uL   Neutrophils Relative 88 (*) 43 - 77 %   Neutro Abs 8.2 (*) 1.7 - 7.7  K/uL   Lymphocytes Relative 6 (*) 12 - 46 %   Lymphs Abs 0.5 (*) 0.7 - 4.0 K/uL   Monocytes Relative 5  3 - 12 %   Monocytes Absolute 0.5  0.1 - 1.0 K/uL   Eosinophils Relative 1  0 - 5 %   Eosinophils Absolute 0.1  0.0 - 0.7 K/uL   Basophils Relative 0  0 - 1 %   Basophils Absolute 0.0  0.0 - 0.1 K/uL  COMPREHENSIVE METABOLIC PANEL   Collection Time    02/12/12  5:45 PM      Result Value Range   Sodium 140  135 - 145 mEq/L   Potassium 4.8  3.5 - 5.1 mEq/L   Chloride 108  96 - 112 mEq/L   CO2 25  19 - 32 mEq/L   Glucose, Bld 99  70 - 99 mg/dL   BUN 20  6 - 23 mg/dL   Creatinine, Ser 2.13 (*) 0.50 - 1.10 mg/dL   Calcium 9.6  8.4 - 08.6 mg/dL   Total Protein 6.2  6.0 - 8.3 g/dL   Albumin 3.0 (*) 3.5 - 5.2 g/dL   AST 14  0 - 37 U/L   ALT 9  0 - 35 U/L   Alkaline Phosphatase 89  39 - 117 U/L   Total Bilirubin 0.2 (*) 0.3 - 1.2 mg/dL   GFR calc non Af Amer 42 (*) >90 mL/min   GFR calc Af Amer 48 (*) >90 mL/min  PROTIME-INR   Collection Time    02/12/12  5:45 PM      Result Value Range   Prothrombin Time 14.3  11.6 - 15.2 seconds   INR 1.13  0.00 - 1.49  APTT   Collection Time    02/12/12  5:45 PM      Result Value Range   aPTT 29  24 - 37 seconds  POCT I-STAT TROPONIN I   Collection Time    02/12/12  6:05 PM      Result Value Range   Troponin i, poc 0.00  0.00 - 0.08 ng/mL   Comment 3           URINE CULTURE   Collection Time    02/12/12  6:09 PM      Result Value Range   Specimen Description URINE, CATHETERIZED     Special Requests ADDED [phone]     Culture  Setup Time 02/12/2012 21:15     Colony Count NO GROWTH     Culture NO GROWTH     Report Status 02/13/2012 FINAL    URINALYSIS, ROUTINE W REFLEX MICROSCOPIC   Collection Time    02/12/12  6:09 PM      Result Value Range   Color, Urine YELLOW  YELLOW   APPearance CLEAR  CLEAR   Specific Gravity,  Urine 1.011  1.005 - 1.030   pH 6.5  5.0 - 8.0   Glucose, UA NEGATIVE  NEGATIVE mg/dL   Hgb urine dipstick  NEGATIVE  NEGATIVE   Bilirubin Urine NEGATIVE  NEGATIVE   Ketones, ur NEGATIVE  NEGATIVE mg/dL   Protein, ur NEGATIVE  NEGATIVE mg/dL   Urobilinogen, UA 1.0  0.0 - 1.0 mg/dL   Nitrite NEGATIVE  NEGATIVE   Leukocytes, UA MODERATE (*) NEGATIVE  URINE MICROSCOPIC-ADD ON   Collection Time    02/12/12  6:09 PM      Result Value Range   Squamous Epithelial / LPF FEW (*) RARE   WBC, UA 11-20  <3 WBC/hpf   RBC / HPF 0-2  <3 RBC/hpf   Bacteria, UA MANY (*) RARE  CBC   Collection Time    02/13/12  5:35 AM      Result Value Range   WBC 9.3  4.0 - 10.5 K/uL   RBC 3.54 (*) 3.87 - 5.11 MIL/uL   Hemoglobin 10.7 (*) 12.0 - 15.0 g/dL   HCT 09.8 (*) 11.9 - 14.7 %   MCV 96.0  78.0 - 100.0 fL   MCH 30.2  26.0 - 34.0 pg   MCHC 31.5  30.0 - 36.0 g/dL   RDW 82.9  56.2 - 13.0 %   Platelets 181  150 - 400 K/uL  BASIC METABOLIC PANEL   Collection Time    02/13/12  5:35 AM      Result Value Range   Sodium 142  135 - 145 mEq/L   Potassium 4.5  3.5 - 5.1 mEq/L   Chloride 110  96 - 112 mEq/L   CO2 26  19 - 32 mEq/L   Glucose, Bld 104 (*) 70 - 99 mg/dL   BUN 22  6 - 23 mg/dL   Creatinine, Ser 8.65 (*) 0.50 - 1.10 mg/dL   Calcium 9.3  8.4 - 78.4 mg/dL   GFR calc non Af Amer 40 (*) >90 mL/min   GFR calc Af Amer 46 (*) >90 mL/min  BASIC METABOLIC PANEL   Collection Time    02/14/12  4:30 AM      Result Value Range   Sodium 143  135 - 145 mEq/L   Potassium 3.9  3.5 - 5.1 mEq/L   Chloride 109  96 - 112 mEq/L   CO2 26  19 - 32 mEq/L   Glucose, Bld 98  70 - 99 mg/dL   BUN 18  6 - 23 mg/dL   Creatinine, Ser 6.96 (*) 0.50 - 1.10 mg/dL   Calcium 9.9  8.4 - 29.5 mg/dL   GFR calc non Af Amer 39 (*) >90 mL/min   GFR calc Af Amer 45 (*) >90 mL/min  CBC   Collection Time    02/14/12  4:30 AM      Result Value Range   WBC 10.4  4.0 - 10.5 K/uL   RBC 3.70 (*) 3.87 - 5.11 MIL/uL   Hemoglobin 11.1 (*) 12.0 - 15.0 g/dL   HCT 28.4 (*) 13.2 - 44.0 %   MCV 94.6  78.0 - 100.0 fL   MCH 30.0  26.0 - 34.0 pg    MCHC 31.7  30.0 - 36.0 g/dL   RDW 10.2  72.5 - 36.6 %   Platelets 171  150 - 400 K/uL  Results for orders placed during the hospital encounter of 12/30/11 (from the past 8736 hour(s))  URINE RAPID DRUG SCREEN (HOSP PERFORMED)   Collection  Time    12/30/11  7:36 PM      Result Value Range   Opiates NONE DETECTED  NONE DETECTED   Cocaine NONE DETECTED  NONE DETECTED   Benzodiazepines NONE DETECTED  NONE DETECTED   Amphetamines NONE DETECTED  NONE DETECTED   Tetrahydrocannabinol NONE DETECTED  NONE DETECTED   Barbiturates NONE DETECTED  NONE DETECTED  CBC WITH DIFFERENTIAL   Collection Time    12/30/11  7:44 PM      Result Value Range   WBC 7.0  4.0 - 10.5 K/uL   RBC 4.65  3.87 - 5.11 MIL/uL   Hemoglobin 14.0  12.0 - 15.0 g/dL   HCT 95.6  21.3 - 08.6 %   MCV 92.3  78.0 - 100.0 fL   MCH 30.1  26.0 - 34.0 pg   MCHC 32.6  30.0 - 36.0 g/dL   RDW 57.8  46.9 - 62.9 %   Platelets 219  150 - 400 K/uL   Neutrophils Relative 78 (*) 43 - 77 %   Neutro Abs 5.5  1.7 - 7.7 K/uL   Lymphocytes Relative 15  12 - 46 %   Lymphs Abs 1.0  0.7 - 4.0 K/uL   Monocytes Relative 6  3 - 12 %   Monocytes Absolute 0.4  0.1 - 1.0 K/uL   Eosinophils Relative 1  0 - 5 %   Eosinophils Absolute 0.1  0.0 - 0.7 K/uL   Basophils Relative 0  0 - 1 %   Basophils Absolute 0.0  0.0 - 0.1 K/uL  BASIC METABOLIC PANEL   Collection Time    12/30/11  7:44 PM      Result Value Range   Sodium 139  135 - 145 mEq/L   Potassium 3.7  3.5 - 5.1 mEq/L   Chloride 103  96 - 112 mEq/L   CO2 26  19 - 32 mEq/L   Glucose, Bld 92  70 - 99 mg/dL   BUN 16  6 - 23 mg/dL   Creatinine, Ser 5.28 (*) 0.50 - 1.10 mg/dL   Calcium 41.3 (*) 8.4 - 10.5 mg/dL   GFR calc non Af Amer 44 (*) >90 mL/min   GFR calc Af Amer 51 (*) >90 mL/min  ETHANOL   Collection Time    12/30/11  7:44 PM      Result Value Range   Alcohol, Ethyl (B) <11  0 - 11 mg/dL  Results for orders placed during the hospital encounter of 12/13/11 (from the past 8736  hour(s))  CBC WITH DIFFERENTIAL   Collection Time    12/13/11  3:50 PM      Result Value Range   WBC 7.5  4.0 - 10.5 K/uL   RBC 4.01  3.87 - 5.11 MIL/uL   Hemoglobin 12.2  12.0 - 15.0 g/dL   HCT 24.4  01.0 - 27.2 %   MCV 93.3  78.0 - 100.0 fL   MCH 30.4  26.0 - 34.0 pg   MCHC 32.6  30.0 - 36.0 g/dL   RDW 53.6  64.4 - 03.4 %   Platelets 206  150 - 400 K/uL   Neutrophils Relative 80 (*) 43 - 77 %   Neutro Abs 6.0  1.7 - 7.7 K/uL   Lymphocytes Relative 13  12 - 46 %   Lymphs Abs 1.0  0.7 - 4.0 K/uL   Monocytes Relative 6  3 - 12 %   Monocytes Absolute 0.5  0.1 - 1.0 K/uL  Eosinophils Relative 1  0 - 5 %   Eosinophils Absolute 0.1  0.0 - 0.7 K/uL   Basophils Relative 0  0 - 1 %   Basophils Absolute 0.0  0.0 - 0.1 K/uL  COMPREHENSIVE METABOLIC PANEL   Collection Time    12/13/11  3:50 PM      Result Value Range   Sodium 137  135 - 145 mEq/L   Potassium 4.6  3.5 - 5.1 mEq/L   Chloride 103  96 - 112 mEq/L   CO2 25  19 - 32 mEq/L   Glucose, Bld 126 (*) 70 - 99 mg/dL   BUN 17  6 - 23 mg/dL   Creatinine, Ser 9.81 (*) 0.50 - 1.10 mg/dL   Calcium 19.1  8.4 - 47.8 mg/dL   Total Protein 7.1  6.0 - 8.3 g/dL   Albumin 3.6  3.5 - 5.2 g/dL   AST 21  0 - 37 U/L   ALT 10  0 - 35 U/L   Alkaline Phosphatase 89  39 - 117 U/L   Total Bilirubin 0.3  0.3 - 1.2 mg/dL   GFR calc non Af Amer 40 (*) >90 mL/min   GFR calc Af Amer 46 (*) >90 mL/min  URINALYSIS, ROUTINE W REFLEX MICROSCOPIC   Collection Time    12/13/11  4:45 PM      Result Value Range   Color, Urine YELLOW  YELLOW   APPearance CLEAR  CLEAR   Specific Gravity, Urine 1.015  1.005 - 1.030   pH 7.0  5.0 - 8.0   Glucose, UA NEGATIVE  NEGATIVE mg/dL   Hgb urine dipstick NEGATIVE  NEGATIVE   Bilirubin Urine NEGATIVE  NEGATIVE   Ketones, ur NEGATIVE  NEGATIVE mg/dL   Protein, ur NEGATIVE  NEGATIVE mg/dL   Urobilinogen, UA 0.2  0.0 - 1.0 mg/dL   Nitrite NEGATIVE  NEGATIVE   Leukocytes, UA NEGATIVE  NEGATIVE  Results for orders  placed during the hospital encounter of 09/15/11 (from the past 8736 hour(s))  BASIC METABOLIC PANEL   Collection Time    09/15/11  7:09 PM      Result Value Range   Sodium 137  135 - 145 mEq/L   Potassium 3.8  3.5 - 5.1 mEq/L   Chloride 101  96 - 112 mEq/L   CO2 26  19 - 32 mEq/L   Glucose, Bld 113 (*) 70 - 99 mg/dL   BUN 18  6 - 23 mg/dL   Creatinine, Ser 2.95 (*) 0.50 - 1.10 mg/dL   Calcium 9.3  8.4 - 62.1 mg/dL   GFR calc non Af Amer 45 (*) >90 mL/min   GFR calc Af Amer 53 (*) >90 mL/min  CBC   Collection Time    09/15/11  7:09 PM      Result Value Range   WBC 10.0  4.0 - 10.5 K/uL   RBC 4.25  3.87 - 5.11 MIL/uL   Hemoglobin 12.6  12.0 - 15.0 g/dL   HCT 30.8  65.7 - 84.6 %   MCV 92.5  78.0 - 100.0 fL   MCH 29.6  26.0 - 34.0 pg   MCHC 32.1  30.0 - 36.0 g/dL   RDW 96.2  95.2 - 84.1 %   Platelets 199  150 - 400 K/uL  DIFFERENTIAL   Collection Time    09/15/11  7:09 PM      Result Value Range   Neutrophils Relative 88 (*) 43 - 77 %  Neutro Abs 8.8 (*) 1.7 - 7.7 K/uL   Lymphocytes Relative 5 (*) 12 - 46 %   Lymphs Abs 0.5 (*) 0.7 - 4.0 K/uL   Monocytes Relative 7  3 - 12 %   Monocytes Absolute 0.7  0.1 - 1.0 K/uL   Eosinophils Relative 1  0 - 5 %   Eosinophils Absolute 0.1  0.0 - 0.7 K/uL   Basophils Relative 0  0 - 1 %   Basophils Absolute 0.0  0.0 - 0.1 K/uL  TROPONIN I   Collection Time    09/15/11  7:09 PM      Result Value Range   Troponin I <0.30  <0.30 ng/mL  LACTIC ACID, PLASMA   Collection Time    09/15/11  7:09 PM      Result Value Range   Lactic Acid, Venous 1.3  0.5 - 2.2 mmol/L  PROCALCITONIN   Collection Time    09/15/11  7:09 PM      Result Value Range   Procalcitonin 0.59    URINE CULTURE   Collection Time    09/15/11  8:31 PM      Result Value Range   Specimen Description URINE, CLEAN CATCH     Special Requests NONE     Culture  Setup Time [id number]     Colony Count >=100,000 COLONIES/ML     Culture       Value: GROUP B  STREP(S.AGALACTIAE)ISOLATED     Note: TESTING AGAINST S. AGALACTIAE NOT ROUTINELY PERFORMED DUE TO PREDICTABILITY OF AMP/PEN/VAN SUSCEPTIBILITY.     LACTOBACILLUS SPECIES     Note: Standardized susceptibility testing for this organism is not available.   Report Status 09/17/2011 FINAL    URINALYSIS, ROUTINE W REFLEX MICROSCOPIC   Collection Time    09/15/11  8:31 PM      Result Value Range   Color, Urine YELLOW  YELLOW   APPearance CLEAR  CLEAR   Specific Gravity, Urine <1.005 (*) 1.005 - 1.030   pH 6.0  5.0 - 8.0   Glucose, UA NEGATIVE  NEGATIVE mg/dL   Hgb urine dipstick NEGATIVE  NEGATIVE   Bilirubin Urine NEGATIVE  NEGATIVE   Ketones, ur NEGATIVE  NEGATIVE mg/dL   Protein, ur NEGATIVE  NEGATIVE mg/dL   Urobilinogen, UA 0.2  0.0 - 1.0 mg/dL   Nitrite NEGATIVE  NEGATIVE   Leukocytes, UA NEGATIVE  NEGATIVE  MRSA PCR SCREENING   Collection Time    09/15/11 11:32 PM      Result Value Range   MRSA by PCR NEGATIVE  NEGATIVE  TSH   Collection Time    09/16/11 12:57 AM      Result Value Range   TSH 0.215 (*) 0.350 - 4.500 uIU/mL  CARDIAC PANEL(CRET KIN+CKTOT+MB+TROPI)   Collection Time    09/16/11 12:57 AM      Result Value Range   Total CK 55  7 - 177 U/L   CK, MB 2.9  0.3 - 4.0 ng/mL   Troponin I <0.30  <0.30 ng/mL   Relative Index RELATIVE INDEX IS INVALID  0.0 - 2.5  BASIC METABOLIC PANEL   Collection Time    09/16/11  4:17 AM      Result Value Range   Sodium 141  135 - 145 mEq/L   Potassium 4.0  3.5 - 5.1 mEq/L   Chloride 107  96 - 112 mEq/L   CO2 27  19 - 32 mEq/L   Glucose, Bld 92  70 -  99 mg/dL   BUN 15  6 - 23 mg/dL   Creatinine, Ser 7.82  0.50 - 1.10 mg/dL   Calcium 9.2  8.4 - 95.6 mg/dL   GFR calc non Af Amer 49 (*) >90 mL/min   GFR calc Af Amer 57 (*) >90 mL/min  CBC   Collection Time    09/16/11  4:17 AM      Result Value Range   WBC 6.9  4.0 - 10.5 K/uL   RBC 3.94  3.87 - 5.11 MIL/uL   Hemoglobin 11.5 (*) 12.0 - 15.0 g/dL   HCT 21.3  08.6 - 57.8 %    MCV 93.4  78.0 - 100.0 fL   MCH 29.2  26.0 - 34.0 pg   MCHC 31.3  30.0 - 36.0 g/dL   RDW 46.9  62.9 - 52.8 %   Platelets 186  150 - 400 K/uL  CARDIAC PANEL(CRET KIN+CKTOT+MB+TROPI)   Collection Time    09/16/11  8:37 AM      Result Value Range   Total CK 55  7 - 177 U/L   CK, MB 2.7  0.3 - 4.0 ng/mL   Troponin I <0.30  <0.30 ng/mL   Relative Index RELATIVE INDEX IS INVALID  0.0 - 2.5  She had some labs drawn in the [facility] in August.   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  Axis II deferred Axis III see medical history Axis IV mild to moderate Axis V 5-60  Plan: I took her vitals.  I reviewed CC, tobacco/med/surg Hx, meds effects/ side effects, problem list, therapies and responses as well as current situation/symptoms discussed options. See orders and pt instructions for more details.  Medical Decision Making Problem Points:  Established problem, worsening (2), Review of last therapy session (1) and Review of psycho-social stressors (1) Data Points:  Review or order clinical lab tests (1) Review of medication regiment & side effects (2) Review of new medications or change in dosage (2)  I certify that outpatient services furnished can reasonably be expected to improve the patient's condition.   Orson Aloe, MD, [facility]  Addendum:  07/03/2012 Sister called to clarify meds.  She had gotten depressed with the reduction of Risperdal.  Sister will go back up to two a day of the low dose Risperdal.  Her shaking was some better with 1 a day of Cogentin.  Will send script to pharmacy increasing that to 60 a month to cover enough pills.  Discussed her labs and they were pretty good.   Orson Aloe, MD, [facility]

## 2012-07-02 NOTE — Patient Instructions (Signed)
Get labs  Increase the Lamictal to 3 tablets twice a day.  "[employer] by Camelia Phenes could be very helpful.  Call if problems or concerns.

## 2012-07-03 ENCOUNTER — Telehealth (HOSPITAL_COMMUNITY): Payer: Self-pay | Admitting: *Deleted

## 2012-07-03 ENCOUNTER — Telehealth (HOSPITAL_COMMUNITY): Payer: Self-pay | Admitting: Psychiatry

## 2012-07-03 LAB — CBC WITH DIFFERENTIAL/PLATELET
Basophils Absolute: 0 10*3/uL (ref 0.0–0.1)
Basophils Relative: 0 % (ref 0–1)
HCT: 35.7 % — ABNORMAL LOW (ref 36.0–46.0)
MCHC: 32.2 g/dL (ref 30.0–36.0)
Monocytes Absolute: 0.5 10*3/uL (ref 0.1–1.0)
Neutro Abs: 5.9 10*3/uL (ref 1.7–7.7)
Neutrophils Relative %: 81 % — ABNORMAL HIGH (ref 43–77)
RDW: 13.8 % (ref 11.5–15.5)

## 2012-07-03 LAB — HEMOGLOBIN A1C: Mean Plasma Glucose: 111 mg/dL (ref <117)

## 2012-07-03 LAB — COMPREHENSIVE METABOLIC PANEL
ALT: <8 U/L (ref 0–35)
AST: 17 U/L (ref 0–37)
Albumin: 3.7 g/dL (ref 3.5–5.2)
Calcium: 9.7 mg/dL (ref 8.4–10.5)
Chloride: 104 mEq/L (ref 96–112)
Potassium: 4.6 mEq/L (ref 3.5–5.3)
Sodium: 138 mEq/L (ref 135–145)

## 2012-07-03 MED ORDER — BENZTROPINE MESYLATE 0.5 MG PO TABS: 0.5000 mg | ORAL_TABLET | Freq: Two times a day (BID) | ORAL | Status: DC

## 2012-07-03 NOTE — Addendum Note (Signed)
Addended by: Mike Craze on: 07/03/2012 04:52 PM   Modules accepted: Orders

## 2012-07-03 NOTE — Telephone Encounter (Signed)
See addendum in note

## 2012-07-03 NOTE — Telephone Encounter (Signed)
See addendum of last visit note

## 2012-07-20 DIAGNOSIS — M79609 Pain in unspecified limb: Secondary | ICD-10-CM | POA: Diagnosis not present

## 2012-07-20 DIAGNOSIS — Q828 Other specified congenital malformations of skin: Secondary | ICD-10-CM | POA: Diagnosis not present

## 2012-07-20 DIAGNOSIS — B351 Tinea unguium: Secondary | ICD-10-CM | POA: Diagnosis not present

## 2012-07-22 ENCOUNTER — Encounter (HOSPITAL_COMMUNITY): Payer: Self-pay | Admitting: Emergency Medicine

## 2012-07-22 ENCOUNTER — Emergency Department (HOSPITAL_COMMUNITY): Payer: Medicare Other

## 2012-07-22 ENCOUNTER — Emergency Department (HOSPITAL_COMMUNITY)
Admission: EM | Admit: 2012-07-22 | Discharge: 2012-07-22 | Disposition: A | Discharge status: Home / Self Care | Payer: Medicare Other | Attending: Emergency Medicine | Admitting: Emergency Medicine

## 2012-07-22 DIAGNOSIS — Y9289 Other specified places as the place of occurrence of the external cause: Secondary | ICD-10-CM | POA: Insufficient documentation

## 2012-07-22 DIAGNOSIS — Z8679 Personal history of other diseases of the circulatory system: Secondary | ICD-10-CM | POA: Insufficient documentation

## 2012-07-22 DIAGNOSIS — F319 Bipolar disorder, unspecified: Secondary | ICD-10-CM | POA: Diagnosis not present

## 2012-07-22 DIAGNOSIS — W010XXA Fall on same level from slipping, tripping and stumbling without subsequent striking against object, initial encounter: Secondary | ICD-10-CM | POA: Insufficient documentation

## 2012-07-22 DIAGNOSIS — Y9301 Activity, walking, marching and hiking: Secondary | ICD-10-CM | POA: Insufficient documentation

## 2012-07-22 DIAGNOSIS — R42 Dizziness and giddiness: Secondary | ICD-10-CM | POA: Diagnosis not present

## 2012-07-22 DIAGNOSIS — N390 Urinary tract infection, site not specified: Secondary | ICD-10-CM | POA: Insufficient documentation

## 2012-07-22 DIAGNOSIS — F411 Generalized anxiety disorder: Secondary | ICD-10-CM | POA: Insufficient documentation

## 2012-07-22 DIAGNOSIS — S4980XA Other specified injuries of shoulder and upper arm, unspecified arm, initial encounter: Secondary | ICD-10-CM | POA: Diagnosis not present

## 2012-07-22 DIAGNOSIS — Z79899 Other long term (current) drug therapy: Secondary | ICD-10-CM | POA: Insufficient documentation

## 2012-07-22 DIAGNOSIS — Z8739 Personal history of other diseases of the musculoskeletal system and connective tissue: Secondary | ICD-10-CM | POA: Insufficient documentation

## 2012-07-22 DIAGNOSIS — Z7982 Long term (current) use of aspirin: Secondary | ICD-10-CM | POA: Insufficient documentation

## 2012-07-22 DIAGNOSIS — S298XXA Other specified injuries of thorax, initial encounter: Secondary | ICD-10-CM | POA: Diagnosis not present

## 2012-07-22 DIAGNOSIS — S20229A Contusion of unspecified back wall of thorax, initial encounter: Secondary | ICD-10-CM | POA: Diagnosis not present

## 2012-07-22 DIAGNOSIS — E785 Hyperlipidemia, unspecified: Secondary | ICD-10-CM | POA: Insufficient documentation

## 2012-07-22 DIAGNOSIS — R404 Transient alteration of awareness: Secondary | ICD-10-CM | POA: Diagnosis not present

## 2012-07-22 DIAGNOSIS — R079 Chest pain, unspecified: Secondary | ICD-10-CM | POA: Diagnosis not present

## 2012-07-22 DIAGNOSIS — W19XXXA Unspecified fall, initial encounter: Secondary | ICD-10-CM

## 2012-07-22 DIAGNOSIS — I6789 Other cerebrovascular disease: Secondary | ICD-10-CM | POA: Diagnosis not present

## 2012-07-22 LAB — URINALYSIS, ROUTINE W REFLEX MICROSCOPIC
Bilirubin Urine: NEGATIVE
Glucose, UA: NEGATIVE mg/dL
Hgb urine dipstick: NEGATIVE
Specific Gravity, Urine: 1.01 (ref 1.005–1.030)
Urobilinogen, UA: 0.2 mg/dL (ref 0.0–1.0)

## 2012-07-22 LAB — CBC WITH DIFFERENTIAL/PLATELET
Basophils Absolute: 0 10*3/uL (ref 0.0–0.1)
Basophils Relative: 0 % (ref 0–1)
Lymphocytes Relative: 10 % — ABNORMAL LOW (ref 12–46)
MCHC: 32.2 g/dL (ref 30.0–36.0)
Neutro Abs: 6.5 10*3/uL (ref 1.7–7.7)
Platelets: 189 10*3/uL (ref 150–400)
RDW: 13 % (ref 11.5–15.5)
WBC: 8 10*3/uL (ref 4.0–10.5)

## 2012-07-22 LAB — COMPREHENSIVE METABOLIC PANEL
ALT: 7 U/L (ref 0–35)
AST: 13 U/L (ref 0–37)
Albumin: 3.2 g/dL — ABNORMAL LOW (ref 3.5–5.2)
CO2: 28 mEq/L (ref 19–32)
Calcium: 10 mg/dL (ref 8.4–10.5)
Chloride: 102 mEq/L (ref 96–112)
Creatinine, Ser: 1.38 mg/dL — ABNORMAL HIGH (ref 0.50–1.10)
GFR calc non Af Amer: 37 mL/min — ABNORMAL LOW (ref >90)
Sodium: 138 mEq/L (ref 135–145)
Total Bilirubin: 0.2 mg/dL — ABNORMAL LOW (ref 0.3–1.2)

## 2012-07-22 LAB — URINE MICROSCOPIC-ADD ON

## 2012-07-22 MED ORDER — SULFAMETHOXAZOLE-TRIMETHOPRIM 800-160 MG PO TABS: 1.0000 | ORAL_TABLET | Freq: Two times a day (BID) | ORAL | Status: DC

## 2012-07-22 MED ORDER — SULFAMETHOXAZOLE-TMP DS 800-160 MG PO TABS
1.0000 | ORAL_TABLET | Freq: Once | ORAL | Status: AC
  Administered 2012-07-22: 1 via ORAL
  Filled 2012-07-22: qty 1

## 2012-07-22 NOTE — ED Notes (Signed)
MD at bedside. 

## 2012-07-22 NOTE — ED Notes (Signed)
Patient states she does not need anything else at this time.

## 2012-07-22 NOTE — ED Notes (Signed)
Pt tripped and fell this am, called EMS who checked her out but did not bring her in to ED. Pt c/o r shoulder "blade" pain from falling on it today.Later pt states became dizzy and didn't feel right so called EMS the 2nd time. Pt denies any dizziness now and states "i feel pretty good right now". ROM wnl to r arm. Nad. Alert/oriented to most.

## 2012-07-22 NOTE — ED Provider Notes (Signed)
History  This chart was scribed for Ward Givens, MD by Bennett Scrape, ED Scribe. This patient was seen in room APA03/AP[facility]03 and the patient's care was started at 3:39 PM.  CSN: [id number]  Arrival date & time 07/22/12  1538   First MD Initiated Contact with Patient 07/22/12 1539      Chief Complaint  Patient presents with  . Fall    Patient is a 72 y.o. female presenting with fall. The history is provided by the patient. No language interpreter was used.  Fall The fall occurred while walking. Distance fallen: from standing position. She landed on carpet. There was no blood loss. The point of impact was the right shoulder. The pain is present in the right shoulder. Pertinent negatives include no visual change, no nausea, no vomiting, no headaches and no loss of consciousness.     Amy Clay is a 72 y.o. female brought in by ambulance, who presents to the Emergency Department complaining of a fall after losing her balance while walking in a cluttered room with a walker.  She states she tripped over some boxes. She c/o right posterior shoulder pain but denies head trauma or LOC. EMS was called due to the pt or her sister being unable to her get up out of the floor. She denies HA, nausea, emesis, leg pain and visual disturbances as associated symptoms. She has a h/o depression, HLD and bipolar disorder. She denies smoking and alcohol use.  Per EMS, pt fell this morning and they were called to the scene to evaluate. She was not brought to the ED at that time. She then felt dizzy later on in the day and re-called EMS for transport to the ED for evaluation.  PCP is Dr. Phillips Odor Orthopedist is Dr. Romeo Apple   Past Medical History  Diagnosis Date  . High blood pressure   . Arthritis   . Depression   . Anxiety   . Hemorrhoids   . Hyperlipidemia   . Bipolar 1 disorder     Past Surgical History  Procedure Laterality Date  . Back surgery  03/16/87  . Cholecystectomy  08/15/93  .  Partial hysterectomy  04/04/95  . Bladder tacked  04/04/95  . Ankle surgery  04/28/97    right ankle fracture  . Wrist surgery  02/24/09    right wrist fracture  . Colonoscopy  10/26/2002    Dr. Karilyn Cota- small external hemorrhoids otherwise normal   . Breast biopsy  05/16/95    right side  . Savory dilation  10/31/2011    Procedure: SAVORY DILATION;  Surgeon: Corbin Ade, MD;  Location: [facility];  Service: Endoscopy;  Laterality: N/A;  Elease Hashimoto dilation  10/31/2011    Procedure: Elease Hashimoto DILATION;  Surgeon: Corbin Ade, MD;  Location: [facility];  Service: Endoscopy;  Laterality: N/A;  . Abdominal hysterectomy      Family History  Problem Relation Age of Onset  . Arthritis    . Asthma    . Diabetes    . Colon cancer Neg Hx   . Anxiety disorder Paternal Aunt     History  Substance Use Topics  . Smoking status: Never Smoker   . Smokeless tobacco: Not on file  . Alcohol Use: No  lives with sister who is non-smoker   No OB history provided.  Review of Systems  Gastrointestinal: Negative for nausea and vomiting.  Musculoskeletal: Positive for arthralgias (right shoulder pain).  Neurological: Negative for loss of  consciousness and headaches.    Allergies  Benzodiazepines; Ciprofloxacin; Cephalexin; and Penicillins  Home Medications   Current Outpatient Rx  Name  Route  Sig  Dispense  Refill  . acetaminophen (TYLENOL) 650 MG CR tablet   Oral   Take 650 mg by mouth every 8 (eight) hours as needed. For pain         . aspirin EC 81 MG tablet   Oral   Take 81 mg by mouth every morning.          . benztropine (COGENTIN) 0.5 MG tablet   Oral   Take 1 tablet (0.5 mg total) by mouth 2 (two) times daily.   60 tablet   1   . beta carotene w/minerals (OCUVITE) tablet   Oral   Take 1 tablet by mouth every morning.          . Biotin 5000 MCG CAPS   Oral   Take 1 capsule by mouth every morning.         . calcium carbonate (OS-CAL) 600 MG TABS   Oral    Take 600 mg by mouth 2 (two) times daily with a meal.          . Cholecalciferol (VITAMIN D) 2000 UNITS CAPS   Oral   Take 1 capsule by mouth every morning.          . docusate sodium (STOOL SOFTENER) 100 MG capsule   Oral   Take 200 mg by mouth at bedtime.         Marland Kitchen ESTRACE VAGINAL 0.1 MG/GM vaginal cream   Vaginal   Place 2 g vaginally 2 (two) times a week.          . lamoTRIgine (LAMICTAL) 25 MG tablet   Oral   Take 3 tablets (75 mg total) by mouth 2 (two) times daily.   180 tablet   1   . losartan (COZAAR) 100 MG tablet   Oral   Take 100 mg by mouth every morning.          . meloxicam (MOBIC) 15 MG tablet   Oral   Take 15 mg by mouth daily as needed. For pain   *Sometimes patient uses Tylenol Arthritis*         . omeprazole (PRILOSEC) 20 MG capsule   Oral   Take 40 mg by mouth every morning.          Marland Kitchen PARoxetine (PAXIL) 20 MG tablet   Oral   Take 1 tablet (20 mg total) by mouth every morning.   30 tablet   1   . PARoxetine (PAXIL) 40 MG tablet   Oral   Take 1 tablet (40 mg total) by mouth every morning.   30 tablet   1   . risperiDONE (RISPERDAL) 0.25 MG tablet      Take by mouth one to two up to two times a day.   60 tablet   2   . risperiDONE (RISPERDAL) 1 MG tablet   Oral   Take 1 tablet (1 mg total) by mouth at bedtime.   30 tablet   1   . solifenacin (VESICARE) 10 MG tablet   Oral   Take 10 mg by mouth every morning.          . terazosin (HYTRIN) 1 MG capsule   Oral   Take 1 mg by mouth at bedtime. Take with the 2 mg capsule at bedtime         .  terazosin (HYTRIN) 2 MG capsule   Oral   Take 2 mg by mouth at bedtime. Take with the 1 mg capsule at bedtime         . vitamin B-12 (CYANOCOBALAMIN) 1000 MCG tablet   Oral   Take 1,000 mcg by mouth daily.         . vitamin E 400 UNIT capsule   Oral   Take 1 capsule (400 Units total) by mouth 2 (two) times daily.   60 capsule   1     Triage Vitals: BP 141/61   Pulse 87  Temp(Src) 98 F (36.7 C) (Oral)  Resp 16  SpO2 99%  Vital signs normal   Orthostatic vital signs normal   Physical Exam  Nursing note and vitals reviewed. Constitutional: She is oriented to person, place, and time. She appears well-developed and well-nourished.  Non-toxic appearance. She does not appear ill. No distress.  HENT:  Head: Normocephalic and atraumatic.  Right Ear: External ear normal.  Left Ear: External ear normal.  Nose: Nose normal. No mucosal edema or rhinorrhea.  Mouth/Throat: Oropharynx is clear and moist and mucous membranes are normal. No dental abscesses or edematous.  No obvious trauma   Eyes: Conjunctivae and EOM are normal. Pupils are equal, round, and reactive to light.  Neck: Normal range of motion and full passive range of motion without pain. Neck supple.  Cardiovascular: Normal rate, regular rhythm and normal heart sounds.  Exam reveals no gallop and no friction rub.   No murmur heard. Pulmonary/Chest: Effort normal and breath sounds normal. No respiratory distress. She has no wheezes. She has no rhonchi. She has no rales. She exhibits no tenderness (no tenderness to the ribs) and no crepitus.  Abdominal: Soft. Normal appearance and bowel sounds are normal. She exhibits no distension. There is no tenderness. There is no rebound and no guarding.  Musculoskeletal: Normal range of motion. She exhibits no edema and no tenderness.       Back:  Non-tender to the thoracic and lumbar spine, can flex and extend at the hips, knees, shoulders and elbows without discomfort  The linear contusion is noted.   Neurological: She is alert and oriented to person, place, and time. She has normal strength. No cranial nerve deficit.  Skin: Skin is warm, dry and intact. No rash noted. No erythema. No pallor.  Linear red streak just to the right of the lower thoracic region that travels up to the right shoulder with an area of ecchymosis and edema in the middle   Psychiatric: She has a normal mood and affect. Her speech is normal and behavior is normal. Her mood appears not anxious.    ED Course  Procedures (including critical care time)  Medications  sulfamethoxazole-trimethoprim (BACTRIM DS) 800-160 MG per tablet 1 tablet (not administered)    DIAGNOSTIC STUDIES: Oxygen Saturation is 99% on room air, normal by my interpretation.    COORDINATION OF CARE: 3:59 PM-Discussed treatment plan which includes CXR and right scapula XR with pt at bedside and pt agreed to plan. Pt declined all medications.   5:38 PM-Sister in room confirms EMS version, states patient fell about 2 pm. She later felt dizzy.  She states that the pt has a h/o dehydration and has not been drinking well lately. She also states that the pt has been depressed lately and reports an admission to [facility]. She denies that that pt needs a psychiatric evaluation today. Pt reports that she feels lightheaded sometimes.  Orthostatic vitals were performed in the room and the pt denies feeling lightheaded when she was stood up. Pt agrees to CBC and UA. Informed pt of negative radiology results.   7:51 PM-Pt rechecked and is resting comfortably. Informed pt of lab results showing UTI. Discussed discharge plan which includes antibiotic with pt and pt agreed to plan. Also advised pt to follow up with PCP if needed and pt agreed. Pt is eating crackers and drinking a soda. Sister is concerned she may be dehydrated.  Results for orders placed during the hospital encounter of 07/22/12  CBC WITH DIFFERENTIAL      Result Value Range   WBC 8.0  4.0 - 10.5 K/uL   RBC 4.01  3.87 - 5.11 MIL/uL   Hemoglobin 12.3  12.0 - 15.0 g/dL   HCT 16.1  09.6 - 04.5 %   MCV 95.3  78.0 - 100.0 fL   MCH 30.7  26.0 - 34.0 pg   MCHC 32.2  30.0 - 36.0 g/dL   RDW 40.9  81.1 - 91.4 %   Platelets 189  150 - 400 K/uL   Neutrophils Relative 82 (*) 43 - 77 %   Neutro Abs 6.5  1.7 - 7.7 K/uL    Lymphocytes Relative 10 (*) 12 - 46 %   Lymphs Abs 0.8  0.7 - 4.0 K/uL   Monocytes Relative 7  3 - 12 %   Monocytes Absolute 0.5  0.1 - 1.0 K/uL   Eosinophils Relative 1  0 - 5 %   Eosinophils Absolute 0.1  0.0 - 0.7 K/uL   Basophils Relative 0  0 - 1 %   Basophils Absolute 0.0  0.0 - 0.1 K/uL  COMPREHENSIVE METABOLIC PANEL      Result Value Range   Sodium 138  135 - 145 mEq/L   Potassium 4.4  3.5 - 5.1 mEq/L   Chloride 102  96 - 112 mEq/L   CO2 28  19 - 32 mEq/L   Glucose, Bld 108 (*) 70 - 99 mg/dL   BUN 18  6 - 23 mg/dL   Creatinine, Ser 7.82 (*) 0.50 - 1.10 mg/dL   Calcium 95.6  8.4 - 21.3 mg/dL   Total Protein 6.5  6.0 - 8.3 g/dL   Albumin 3.2 (*) 3.5 - 5.2 g/dL   AST 13  0 - 37 U/L   ALT 7  0 - 35 U/L   Alkaline Phosphatase 96  39 - 117 U/L   Total Bilirubin 0.2 (*) 0.3 - 1.2 mg/dL   GFR calc non Af Amer 37 (*) >90 mL/min   GFR calc Af Amer 43 (*) >90 mL/min  URINALYSIS, ROUTINE W REFLEX MICROSCOPIC      Result Value Range   Color, Urine YELLOW  YELLOW   APPearance CLEAR  CLEAR   Specific Gravity, Urine 1.010  1.005 - 1.030   pH 6.0  5.0 - 8.0   Glucose, UA NEGATIVE  NEGATIVE mg/dL   Hgb urine dipstick NEGATIVE  NEGATIVE   Bilirubin Urine NEGATIVE  NEGATIVE   Ketones, ur NEGATIVE  NEGATIVE mg/dL   Protein, ur NEGATIVE  NEGATIVE mg/dL   Urobilinogen, UA 0.2  0.0 - 1.0 mg/dL   Nitrite NEGATIVE  NEGATIVE   Leukocytes, UA SMALL (*) NEGATIVE  URINE MICROSCOPIC-ADD ON      Result Value Range   WBC, UA 3-6  <3 WBC/hpf   RBC / HPF 0-2  <3 RBC/hpf   Bacteria, UA MANY (*) RARE  Laboratory interpretation all normal except probable UTI, stable renal insufficiency   Dg Ribs Unilateral W/chest Right  07/22/2012  *RADIOLOGY REPORT*  Clinical Data: Larey Seat.  Scapular and rib pain.  RIGHT RIBS AND CHEST - 3+ VIEW  Comparison: Same day  Findings: No rib fracture.  No scapular fracture or clavicle fracture.  Right chest appears clear.  IMPRESSION: Negative radiographs.  Original  Report Authenticated By: Paulina Fusi, M.D.    Dg Scapula Right  07/22/2012  *RADIOLOGY REPORT*  Clinical Data: Larey Seat.  Regional trauma.  Assess for scapular fracture.  RIGHT SCAPULA - 2+ VIEWS  Comparison: None.  Findings: Humeral head is properly located.  No fracture of the scapula, distal clavicle or regional ribs is identified.  IMPRESSION: Negative radiographs. Original Report Authenticated By: Paulina Fusi, M.D.      1. Fall, initial encounter   2. Contusion, back, unspecified laterality, initial encounter   3. UTI (lower urinary tract infection)    New Prescriptions   SULFAMETHOXAZOLE-TRIMETHOPRIM (SEPTRA DS) 800-160 MG PER TABLET    Take 1 tablet by mouth every 12 (twelve) hours.    Plan discharge  Devoria Albe, MD, FACEP     MDM    I personally performed the services described in this documentation, which was scribed in my presence. The recorded information has been reviewed and considered.  Devoria Albe, MD, Arm[facility]       Ward Givens, MD 07/22/12 2017

## 2012-07-23 LAB — URINE CULTURE

## 2012-08-17 ENCOUNTER — Other Ambulatory Visit (HOSPITAL_COMMUNITY): Payer: Self-pay | Admitting: Psychiatry

## 2012-08-18 ENCOUNTER — Telehealth (HOSPITAL_COMMUNITY): Payer: Self-pay | Admitting: Psychiatry

## 2012-08-18 NOTE — Telephone Encounter (Signed)
S/W family who reported that they got the Risperdal and acknowledge that there is an appointment in the 17th.

## 2012-08-18 NOTE — Telephone Encounter (Signed)
Refilled med and anticipat seeing in 9 days.

## 2012-08-23 DIAGNOSIS — R69 Illness, unspecified: Secondary | ICD-10-CM | POA: Diagnosis not present

## 2012-08-27 ENCOUNTER — Encounter (HOSPITAL_COMMUNITY): Payer: Self-pay | Admitting: Psychiatry

## 2012-08-27 ENCOUNTER — Ambulatory Visit (INDEPENDENT_AMBULATORY_CARE_PROVIDER_SITE_OTHER): Payer: Medicare Other | Admitting: Psychiatry

## 2012-08-27 VITALS — Wt 200.6 lb

## 2012-08-27 DIAGNOSIS — F09 Unspecified mental disorder due to known physiological condition: Secondary | ICD-10-CM | POA: Diagnosis not present

## 2012-08-27 DIAGNOSIS — F5105 Insomnia due to other mental disorder: Secondary | ICD-10-CM

## 2012-08-27 DIAGNOSIS — F331 Major depressive disorder, recurrent, moderate: Secondary | ICD-10-CM

## 2012-08-27 DIAGNOSIS — F323 Major depressive disorder, single episode, severe with psychotic features: Secondary | ICD-10-CM

## 2012-08-27 DIAGNOSIS — F411 Generalized anxiety disorder: Secondary | ICD-10-CM

## 2012-08-27 DIAGNOSIS — F329 Major depressive disorder, single episode, unspecified: Secondary | ICD-10-CM

## 2012-08-27 MED ORDER — VITAMIN E 180 MG (400 UNIT) PO CAPS: 400.0000 [IU] | ORAL_CAPSULE | Freq: Two times a day (BID) | ORAL | Status: AC

## 2012-08-27 MED ORDER — PAROXETINE HCL 40 MG PO TABS: 40.0000 mg | ORAL_TABLET | ORAL | Status: DC

## 2012-08-27 MED ORDER — BENZTROPINE MESYLATE 0.5 MG PO TABS: 0.5000 mg | ORAL_TABLET | Freq: Two times a day (BID) | ORAL | Status: DC

## 2012-08-27 MED ORDER — RISPERIDONE 0.25 MG PO TABS: ORAL_TABLET | ORAL | Status: DC

## 2012-08-27 MED ORDER — RISPERIDONE 1 MG PO TABS: 1.0000 mg | ORAL_TABLET | Freq: Every day | ORAL | Status: DC

## 2012-08-27 MED ORDER — PAROXETINE HCL 20 MG PO TABS: 20.0000 mg | ORAL_TABLET | ORAL | Status: DC

## 2012-08-27 MED ORDER — LAMOTRIGINE 25 MG PO TABS: 75.0000 mg | ORAL_TABLET | Freq: Two times a day (BID) | ORAL | Status: DC

## 2012-08-27 NOTE — Patient Instructions (Signed)
Set a timer for 8 or a certain number minutes and walk for that amount of time in the house or in the yard.  Mark the number of minutes on a calendar for that day.  Do that every day this week.  Then next week increase the time by 1 minutes and then mark the calendar with the number of minutes for that day.  Each week increase your exercise by one minute.  Keep a record of this so you can see the progress you are making.  Do this every day, just like eating and sleeping.  It is good for pain control, depression, and for your soul/spirit.  Bring the record in for your next visit so we can talk about your effort and how you feel with the new exercise program going and working for you.  Take care of yourself.  No one else is standing up to do the job and only you know what you need.   GET SERIOUS about taking care of yourself.  Do the next right thing and that often means doing something to care for yourself along the lines of are you hungry, are you angry, are you lonely, are you tired, are you scared?  [employer] is what that stands for.  Call if problems or concerns. 

## 2012-08-27 NOTE — Progress Notes (Signed)
[facility] [address] Progress Note EMNET MONK MRN: [id number] DOB: 09/21/1940 Age: 72 y.o.  Date: 08/27/2012 Start Time: 3:15 PM End Time: 3:30 PM  Chief Complaint: Chief Complaint  Patient presents with  . Depression  . Follow-up  . Medication Refill  . Anxiety   Subjective: "I'm able to think better". Depression 3/10 and Anxiety 4/10, where 1 is the best and 10 is the worst.  Pain is 1/10 with her (L) shoulder  History of presenting illness Patient is 72 year old Caucasian female who came with her sister for her followup appointment.  Pt reports that she is compliant with the psychotropic medications with fair benefit and no noticeable side effects.  Her tongue continues to look better with the Vitamin E.  Her thinking is better by her and her sister's observation.  Current psychiatric medication Risperdal 1.0 mg at bedtime and 0.5mg  BID Paxil 40 mg daily in AM Paxil 20 mg daily in AM Lamictal 50 mg twice a day Vitamin E 400 IU each AM Off Ativan  Past psychiatric history Patient is seeing psychiatrist since 1997 in this office.  Her last psychiatric admission was at [facility] after having suicidal thoughts.  She denies any history of suicidal attempt however endorse history of hopeless feeling.  She is diagnosed with major depressive disorder. She she was very stable on lithium until her creatinine went up and lithium was discontinued.  We had recently tried Lamictal and the dose has been increased recently.  In the past we had tried BuSpar which was discontinued at hospital.  She has another psychiatric inpatient treatment in 90s. She admitted history of mania, depression and psychosis.  Social history Patient lives with her sister.  She has no children.  Her husband died in nursing home.  Patient has no other family.  Medical history Patient was recently admitted due to dehydration and UTI.  Patient has history of arthritis blood pressure  and chronic pain. Her primary care physician is Dr. Phillips Odor at [facility].  She takes medication for her blood pressure and arthritis.  She had history of jerking movements, fall and generalized pain. Her last blood work was done on 02/14/2012 which shows anemia, her creatinine was 1.33.  She was given antibiotic result UTI.  Her WBC count was 10.4.  Family History family history includes Anxiety disorder in her paternal aunt; Arthritis in an unspecified family member; Asthma in an unspecified family member; and Diabetes in an unspecified family member.  There is no history of Colon cancer.  Mental status examination Patient is casually dressed and fairly groomed.  She appears tired .  She described her mood is better and her affect is improved .  Her speech is slow and soft but decreased volume and tone.  Her thought processes slow but logical linear and goal-directed.  She has some tremors which could be due to Risperdal.  She uses [employer] for walking.  She maintained fair eye contact.  She's cooperative in conversation.  He denies any active or passive suicidal thoughts or homicidal thoughts.  She has difficulty remembering things however there were no flight of idea or loose association.  There were no obsession or delusions.  Her attention and memory is distracted.  Her fund of knowledge is average.  She's alert and oriented x3.  Her attention and concentration is fair.  Her insight judgment and pulse control is okay.  Lab Results:  Results for orders placed during the hospital encounter of 07/22/12 (from  the past 8736 hour(s))  CBC WITH DIFFERENTIAL   Collection Time    07/22/12  4:56 PM      Result Value Range   WBC 8.0  4.0 - 10.5 K/uL   RBC 4.01  3.87 - 5.11 MIL/uL   Hemoglobin 12.3  12.0 - 15.0 g/dL   HCT 16.1  09.6 - 04.5 %   MCV 95.3  78.0 - 100.0 fL   MCH 30.7  26.0 - 34.0 pg   MCHC 32.2  30.0 - 36.0 g/dL   RDW 40.9  81.1 - 91.4 %   Platelets 189  150 - 400 K/uL    Neutrophils Relative 82 (*) 43 - 77 %   Neutro Abs 6.5  1.7 - 7.7 K/uL   Lymphocytes Relative 10 (*) 12 - 46 %   Lymphs Abs 0.8  0.7 - 4.0 K/uL   Monocytes Relative 7  3 - 12 %   Monocytes Absolute 0.5  0.1 - 1.0 K/uL   Eosinophils Relative 1  0 - 5 %   Eosinophils Absolute 0.1  0.0 - 0.7 K/uL   Basophils Relative 0  0 - 1 %   Basophils Absolute 0.0  0.0 - 0.1 K/uL  COMPREHENSIVE METABOLIC PANEL   Collection Time    07/22/12  4:56 PM      Result Value Range   Sodium 138  135 - 145 mEq/L   Potassium 4.4  3.5 - 5.1 mEq/L   Chloride 102  96 - 112 mEq/L   CO2 28  19 - 32 mEq/L   Glucose, Bld 108 (*) 70 - 99 mg/dL   BUN 18  6 - 23 mg/dL   Creatinine, Ser 7.82 (*) 0.50 - 1.10 mg/dL   Calcium 95.6  8.4 - 21.3 mg/dL   Total Protein 6.5  6.0 - 8.3 g/dL   Albumin 3.2 (*) 3.5 - 5.2 g/dL   AST 13  0 - 37 U/L   ALT 7  0 - 35 U/L   Alkaline Phosphatase 96  39 - 117 U/L   Total Bilirubin 0.2 (*) 0.3 - 1.2 mg/dL   GFR calc non Af Amer 37 (*) >90 mL/min   GFR calc Af Amer 43 (*) >90 mL/min  URINE CULTURE   Collection Time    07/22/12  5:51 PM      Result Value Range   Specimen Description URINE, CATHETERIZED     Special Requests NONE     Culture  Setup Time 07/22/2012 18:35     Colony Count NO GROWTH     Culture NO GROWTH     Report Status 07/23/2012 FINAL    URINALYSIS, ROUTINE W REFLEX MICROSCOPIC   Collection Time    07/22/12  5:51 PM      Result Value Range   Color, Urine YELLOW  YELLOW   APPearance CLEAR  CLEAR   Specific Gravity, Urine 1.010  1.005 - 1.030   pH 6.0  5.0 - 8.0   Glucose, UA NEGATIVE  NEGATIVE mg/dL   Hgb urine dipstick NEGATIVE  NEGATIVE   Bilirubin Urine NEGATIVE  NEGATIVE   Ketones, ur NEGATIVE  NEGATIVE mg/dL   Protein, ur NEGATIVE  NEGATIVE mg/dL   Urobilinogen, UA 0.2  0.0 - 1.0 mg/dL   Nitrite NEGATIVE  NEGATIVE   Leukocytes, UA SMALL (*) NEGATIVE  URINE MICROSCOPIC-ADD ON   Collection Time    07/22/12  5:51 PM      Result Value Range   WBC, UA  3-6  <3 WBC/hpf   RBC / HPF 0-2  <3 RBC/hpf   Bacteria, UA MANY (*) RARE  Results for orders placed in visit on 07/02/12 (from the past 8736 hour(s))  COMPREHENSIVE METABOLIC PANEL   Collection Time    07/02/12  1:45 PM      Result Value Range   Sodium 138  135 - 145 mEq/L   Potassium 4.6  3.5 - 5.3 mEq/L   Chloride 104  96 - 112 mEq/L   CO2 28  19 - 32 mEq/L   Glucose, Bld 138 (*) 70 - 99 mg/dL   BUN 27 (*) 6 - 23 mg/dL   Creat 1.61 (*) 0.96 - 1.10 mg/dL   Total Bilirubin 0.4  0.3 - 1.2 mg/dL   Alkaline Phosphatase 86  39 - 117 U/L   AST 17  0 - 37 U/L   ALT <8  0 - 35 U/L   Total Protein 6.3  6.0 - 8.3 g/dL   Albumin 3.7  3.5 - 5.2 g/dL   Calcium 9.7  8.4 - 04.5 mg/dL  CBC WITH DIFFERENTIAL   Collection Time    07/02/12  1:45 PM      Result Value Range   WBC 7.3  4.0 - 10.5 K/uL   RBC 3.87  3.87 - 5.11 MIL/uL   Hemoglobin 11.5 (*) 12.0 - 15.0 g/dL   HCT 40.9 (*) 81.1 - 91.4 %   MCV 92.2  78.0 - 100.0 fL   MCH 29.7  26.0 - 34.0 pg   MCHC 32.2  30.0 - 36.0 g/dL   RDW 78.2  95.6 - 21.3 %   Platelets 250  150 - 400 K/uL   Neutrophils Relative 81 (*) 43 - 77 %   Neutro Abs 5.9  1.7 - 7.7 K/uL   Lymphocytes Relative 11 (*) 12 - 46 %   Lymphs Abs 0.8  0.7 - 4.0 K/uL   Monocytes Relative 7  3 - 12 %   Monocytes Absolute 0.5  0.1 - 1.0 K/uL   Eosinophils Relative 1  0 - 5 %   Eosinophils Absolute 0.1  0.0 - 0.7 K/uL   Basophils Relative 0  0 - 1 %   Basophils Absolute 0.0  0.0 - 0.1 K/uL   Smear Review Criteria for review not met    HEMOGLOBIN A1C   Collection Time    07/02/12  1:45 PM      Result Value Range   Hemoglobin A1C 5.5  <5.7 %   Mean Plasma Glucose 111  <117 mg/dL  Results for orders placed during the hospital encounter of 02/12/12 (from the past 8736 hour(s))  CBC WITH DIFFERENTIAL   Collection Time    02/12/12  5:45 PM      Result Value Range   WBC 9.2  4.0 - 10.5 K/uL   RBC 3.63 (*) 3.87 - 5.11 MIL/uL   Hemoglobin 11.0 (*) 12.0 - 15.0 g/dL   HCT 08.6  (*) 57.8 - 46.0 %   MCV 95.6  78.0 - 100.0 fL   MCH 30.3  26.0 - 34.0 pg   MCHC 31.7  30.0 - 36.0 g/dL   RDW 46.9  62.9 - 52.8 %   Platelets 162  150 - 400 K/uL   Neutrophils Relative 88 (*) 43 - 77 %   Neutro Abs 8.2 (*) 1.7 - 7.7 K/uL   Lymphocytes Relative 6 (*) 12 - 46 %   Lymphs Abs 0.5 (*) 0.7 -  4.0 K/uL   Monocytes Relative 5  3 - 12 %   Monocytes Absolute 0.5  0.1 - 1.0 K/uL   Eosinophils Relative 1  0 - 5 %   Eosinophils Absolute 0.1  0.0 - 0.7 K/uL   Basophils Relative 0  0 - 1 %   Basophils Absolute 0.0  0.0 - 0.1 K/uL  COMPREHENSIVE METABOLIC PANEL   Collection Time    02/12/12  5:45 PM      Result Value Range   Sodium 140  135 - 145 mEq/L   Potassium 4.8  3.5 - 5.1 mEq/L   Chloride 108  96 - 112 mEq/L   CO2 25  19 - 32 mEq/L   Glucose, Bld 99  70 - 99 mg/dL   BUN 20  6 - 23 mg/dL   Creatinine, Ser 1.61 (*) 0.50 - 1.10 mg/dL   Calcium 9.6  8.4 - 09.6 mg/dL   Total Protein 6.2  6.0 - 8.3 g/dL   Albumin 3.0 (*) 3.5 - 5.2 g/dL   AST 14  0 - 37 U/L   ALT 9  0 - 35 U/L   Alkaline Phosphatase 89  39 - 117 U/L   Total Bilirubin 0.2 (*) 0.3 - 1.2 mg/dL   GFR calc non Af Amer 42 (*) >90 mL/min   GFR calc Af Amer 48 (*) >90 mL/min  PROTIME-INR   Collection Time    02/12/12  5:45 PM      Result Value Range   Prothrombin Time 14.3  11.6 - 15.2 seconds   INR 1.13  0.00 - 1.49  APTT   Collection Time    02/12/12  5:45 PM      Result Value Range   aPTT 29  24 - 37 seconds  POCT I-STAT TROPONIN I   Collection Time    02/12/12  6:05 PM      Result Value Range   Troponin i, poc 0.00  0.00 - 0.08 ng/mL   Comment 3           URINE CULTURE   Collection Time    02/12/12  6:09 PM      Result Value Range   Specimen Description URINE, CATHETERIZED     Special Requests ADDED [phone]     Culture  Setup Time 02/12/2012 21:15     Colony Count NO GROWTH     Culture NO GROWTH     Report Status 02/13/2012 FINAL    URINALYSIS, ROUTINE W REFLEX MICROSCOPIC   Collection  Time    02/12/12  6:09 PM      Result Value Range   Color, Urine YELLOW  YELLOW   APPearance CLEAR  CLEAR   Specific Gravity, Urine 1.011  1.005 - 1.030   pH 6.5  5.0 - 8.0   Glucose, UA NEGATIVE  NEGATIVE mg/dL   Hgb urine dipstick NEGATIVE  NEGATIVE   Bilirubin Urine NEGATIVE  NEGATIVE   Ketones, ur NEGATIVE  NEGATIVE mg/dL   Protein, ur NEGATIVE  NEGATIVE mg/dL   Urobilinogen, UA 1.0  0.0 - 1.0 mg/dL   Nitrite NEGATIVE  NEGATIVE   Leukocytes, UA MODERATE (*) NEGATIVE  URINE MICROSCOPIC-ADD ON   Collection Time    02/12/12  6:09 PM      Result Value Range   Squamous Epithelial / LPF FEW (*) RARE   WBC, UA 11-20  <3 WBC/hpf   RBC / HPF 0-2  <3 RBC/hpf   Bacteria, UA MANY (*)  RARE  CBC   Collection Time    02/13/12  5:35 AM      Result Value Range   WBC 9.3  4.0 - 10.5 K/uL   RBC 3.54 (*) 3.87 - 5.11 MIL/uL   Hemoglobin 10.7 (*) 12.0 - 15.0 g/dL   HCT 10.2 (*) 72.5 - 36.6 %   MCV 96.0  78.0 - 100.0 fL   MCH 30.2  26.0 - 34.0 pg   MCHC 31.5  30.0 - 36.0 g/dL   RDW 44.0  34.7 - 42.5 %   Platelets 181  150 - 400 K/uL  BASIC METABOLIC PANEL   Collection Time    02/13/12  5:35 AM      Result Value Range   Sodium 142  135 - 145 mEq/L   Potassium 4.5  3.5 - 5.1 mEq/L   Chloride 110  96 - 112 mEq/L   CO2 26  19 - 32 mEq/L   Glucose, Bld 104 (*) 70 - 99 mg/dL   BUN 22  6 - 23 mg/dL   Creatinine, Ser 9.56 (*) 0.50 - 1.10 mg/dL   Calcium 9.3  8.4 - 38.7 mg/dL   GFR calc non Af Amer 40 (*) >90 mL/min   GFR calc Af Amer 46 (*) >90 mL/min  BASIC METABOLIC PANEL   Collection Time    02/14/12  4:30 AM      Result Value Range   Sodium 143  135 - 145 mEq/L   Potassium 3.9  3.5 - 5.1 mEq/L   Chloride 109  96 - 112 mEq/L   CO2 26  19 - 32 mEq/L   Glucose, Bld 98  70 - 99 mg/dL   BUN 18  6 - 23 mg/dL   Creatinine, Ser 5.64 (*) 0.50 - 1.10 mg/dL   Calcium 9.9  8.4 - 33.2 mg/dL   GFR calc non Af Amer 39 (*) >90 mL/min   GFR calc Af Amer 45 (*) >90 mL/min  CBC   Collection  Time    02/14/12  4:30 AM      Result Value Range   WBC 10.4  4.0 - 10.5 K/uL   RBC 3.70 (*) 3.87 - 5.11 MIL/uL   Hemoglobin 11.1 (*) 12.0 - 15.0 g/dL   HCT 95.1 (*) 88.4 - 16.6 %   MCV 94.6  78.0 - 100.0 fL   MCH 30.0  26.0 - 34.0 pg   MCHC 31.7  30.0 - 36.0 g/dL   RDW 06.3  01.6 - 01.0 %   Platelets 171  150 - 400 K/uL  Results for orders placed during the hospital encounter of 12/30/11 (from the past 8736 hour(s))  URINE RAPID DRUG SCREEN (HOSP PERFORMED)   Collection Time    12/30/11  7:36 PM      Result Value Range   Opiates NONE DETECTED  NONE DETECTED   Cocaine NONE DETECTED  NONE DETECTED   Benzodiazepines NONE DETECTED  NONE DETECTED   Amphetamines NONE DETECTED  NONE DETECTED   Tetrahydrocannabinol NONE DETECTED  NONE DETECTED   Barbiturates NONE DETECTED  NONE DETECTED  CBC WITH DIFFERENTIAL   Collection Time    12/30/11  7:44 PM      Result Value Range   WBC 7.0  4.0 - 10.5 K/uL   RBC 4.65  3.87 - 5.11 MIL/uL   Hemoglobin 14.0  12.0 - 15.0 g/dL   HCT 93.2  35.5 - 73.2 %   MCV 92.3  78.0 - 100.0 fL  MCH 30.1  26.0 - 34.0 pg   MCHC 32.6  30.0 - 36.0 g/dL   RDW 16.1  09.6 - 04.5 %   Platelets 219  150 - 400 K/uL   Neutrophils Relative 78 (*) 43 - 77 %   Neutro Abs 5.5  1.7 - 7.7 K/uL   Lymphocytes Relative 15  12 - 46 %   Lymphs Abs 1.0  0.7 - 4.0 K/uL   Monocytes Relative 6  3 - 12 %   Monocytes Absolute 0.4  0.1 - 1.0 K/uL   Eosinophils Relative 1  0 - 5 %   Eosinophils Absolute 0.1  0.0 - 0.7 K/uL   Basophils Relative 0  0 - 1 %   Basophils Absolute 0.0  0.0 - 0.1 K/uL  BASIC METABOLIC PANEL   Collection Time    12/30/11  7:44 PM      Result Value Range   Sodium 139  135 - 145 mEq/L   Potassium 3.7  3.5 - 5.1 mEq/L   Chloride 103  96 - 112 mEq/L   CO2 26  19 - 32 mEq/L   Glucose, Bld 92  70 - 99 mg/dL   BUN 16  6 - 23 mg/dL   Creatinine, Ser 4.09 (*) 0.50 - 1.10 mg/dL   Calcium 81.1 (*) 8.4 - 10.5 mg/dL   GFR calc non Af Amer 44 (*) >90 mL/min    GFR calc Af Amer 51 (*) >90 mL/min  ETHANOL   Collection Time    12/30/11  7:44 PM      Result Value Range   Alcohol, Ethyl (B) <11  0 - 11 mg/dL  Results for orders placed during the hospital encounter of 12/13/11 (from the past 8736 hour(s))  CBC WITH DIFFERENTIAL   Collection Time    12/13/11  3:50 PM      Result Value Range   WBC 7.5  4.0 - 10.5 K/uL   RBC 4.01  3.87 - 5.11 MIL/uL   Hemoglobin 12.2  12.0 - 15.0 g/dL   HCT 91.4  78.2 - 95.6 %   MCV 93.3  78.0 - 100.0 fL   MCH 30.4  26.0 - 34.0 pg   MCHC 32.6  30.0 - 36.0 g/dL   RDW 21.3  08.6 - 57.8 %   Platelets 206  150 - 400 K/uL   Neutrophils Relative 80 (*) 43 - 77 %   Neutro Abs 6.0  1.7 - 7.7 K/uL   Lymphocytes Relative 13  12 - 46 %   Lymphs Abs 1.0  0.7 - 4.0 K/uL   Monocytes Relative 6  3 - 12 %   Monocytes Absolute 0.5  0.1 - 1.0 K/uL   Eosinophils Relative 1  0 - 5 %   Eosinophils Absolute 0.1  0.0 - 0.7 K/uL   Basophils Relative 0  0 - 1 %   Basophils Absolute 0.0  0.0 - 0.1 K/uL  COMPREHENSIVE METABOLIC PANEL   Collection Time    12/13/11  3:50 PM      Result Value Range   Sodium 137  135 - 145 mEq/L   Potassium 4.6  3.5 - 5.1 mEq/L   Chloride 103  96 - 112 mEq/L   CO2 25  19 - 32 mEq/L   Glucose, Bld 126 (*) 70 - 99 mg/dL   BUN 17  6 - 23 mg/dL   Creatinine, Ser 4.69 (*) 0.50 - 1.10 mg/dL   Calcium 62.9  8.4 -  10.5 mg/dL   Total Protein 7.1  6.0 - 8.3 g/dL   Albumin 3.6  3.5 - 5.2 g/dL   AST 21  0 - 37 U/L   ALT 10  0 - 35 U/L   Alkaline Phosphatase 89  39 - 117 U/L   Total Bilirubin 0.3  0.3 - 1.2 mg/dL   GFR calc non Af Amer 40 (*) >90 mL/min   GFR calc Af Amer 46 (*) >90 mL/min  URINALYSIS, ROUTINE W REFLEX MICROSCOPIC   Collection Time    12/13/11  4:45 PM      Result Value Range   Color, Urine YELLOW  YELLOW   APPearance CLEAR  CLEAR   Specific Gravity, Urine 1.015  1.005 - 1.030   pH 7.0  5.0 - 8.0   Glucose, UA NEGATIVE  NEGATIVE mg/dL   Hgb urine dipstick NEGATIVE  NEGATIVE    Bilirubin Urine NEGATIVE  NEGATIVE   Ketones, ur NEGATIVE  NEGATIVE mg/dL   Protein, ur NEGATIVE  NEGATIVE mg/dL   Urobilinogen, UA 0.2  0.0 - 1.0 mg/dL   Nitrite NEGATIVE  NEGATIVE   Leukocytes, UA NEGATIVE  NEGATIVE  Results for orders placed during the hospital encounter of 09/15/11 (from the past 8736 hour(s))  BASIC METABOLIC PANEL   Collection Time    09/15/11  7:09 PM      Result Value Range   Sodium 137  135 - 145 mEq/L   Potassium 3.8  3.5 - 5.1 mEq/L   Chloride 101  96 - 112 mEq/L   CO2 26  19 - 32 mEq/L   Glucose, Bld 113 (*) 70 - 99 mg/dL   BUN 18  6 - 23 mg/dL   Creatinine, Ser 8.11 (*) 0.50 - 1.10 mg/dL   Calcium 9.3  8.4 - 91.4 mg/dL   GFR calc non Af Amer 45 (*) >90 mL/min   GFR calc Af Amer 53 (*) >90 mL/min  CBC   Collection Time    09/15/11  7:09 PM      Result Value Range   WBC 10.0  4.0 - 10.5 K/uL   RBC 4.25  3.87 - 5.11 MIL/uL   Hemoglobin 12.6  12.0 - 15.0 g/dL   HCT 78.2  95.6 - 21.3 %   MCV 92.5  78.0 - 100.0 fL   MCH 29.6  26.0 - 34.0 pg   MCHC 32.1  30.0 - 36.0 g/dL   RDW 08.6  57.8 - 46.9 %   Platelets 199  150 - 400 K/uL  DIFFERENTIAL   Collection Time    09/15/11  7:09 PM      Result Value Range   Neutrophils Relative 88 (*) 43 - 77 %   Neutro Abs 8.8 (*) 1.7 - 7.7 K/uL   Lymphocytes Relative 5 (*) 12 - 46 %   Lymphs Abs 0.5 (*) 0.7 - 4.0 K/uL   Monocytes Relative 7  3 - 12 %   Monocytes Absolute 0.7  0.1 - 1.0 K/uL   Eosinophils Relative 1  0 - 5 %   Eosinophils Absolute 0.1  0.0 - 0.7 K/uL   Basophils Relative 0  0 - 1 %   Basophils Absolute 0.0  0.0 - 0.1 K/uL  TROPONIN I   Collection Time    09/15/11  7:09 PM      Result Value Range   Troponin I <0.30  <0.30 ng/mL  LACTIC ACID, PLASMA   Collection Time    09/15/11  7:09  PM      Result Value Range   Lactic Acid, Venous 1.3  0.5 - 2.2 mmol/L  PROCALCITONIN   Collection Time    09/15/11  7:09 PM      Result Value Range   Procalcitonin 0.59    URINE CULTURE   Collection  Time    09/15/11  8:31 PM      Result Value Range   Specimen Description URINE, CLEAN CATCH     Special Requests NONE     Culture  Setup Time [id number]     Colony Count >=100,000 COLONIES/ML     Culture       Value: GROUP B STREP(S.AGALACTIAE)ISOLATED     Note: TESTING AGAINST S. AGALACTIAE NOT ROUTINELY PERFORMED DUE TO PREDICTABILITY OF AMP/PEN/VAN SUSCEPTIBILITY.     LACTOBACILLUS SPECIES     Note: Standardized susceptibility testing for this organism is not available.   Report Status 09/17/2011 FINAL    URINALYSIS, ROUTINE W REFLEX MICROSCOPIC   Collection Time    09/15/11  8:31 PM      Result Value Range   Color, Urine YELLOW  YELLOW   APPearance CLEAR  CLEAR   Specific Gravity, Urine <1.005 (*) 1.005 - 1.030   pH 6.0  5.0 - 8.0   Glucose, UA NEGATIVE  NEGATIVE mg/dL   Hgb urine dipstick NEGATIVE  NEGATIVE   Bilirubin Urine NEGATIVE  NEGATIVE   Ketones, ur NEGATIVE  NEGATIVE mg/dL   Protein, ur NEGATIVE  NEGATIVE mg/dL   Urobilinogen, UA 0.2  0.0 - 1.0 mg/dL   Nitrite NEGATIVE  NEGATIVE   Leukocytes, UA NEGATIVE  NEGATIVE  MRSA PCR SCREENING   Collection Time    09/15/11 11:32 PM      Result Value Range   MRSA by PCR NEGATIVE  NEGATIVE  TSH   Collection Time    09/16/11 12:57 AM      Result Value Range   TSH 0.215 (*) 0.350 - 4.500 uIU/mL  CARDIAC PANEL(CRET KIN+CKTOT+MB+TROPI)   Collection Time    09/16/11 12:57 AM      Result Value Range   Total CK 55  7 - 177 U/L   CK, MB 2.9  0.3 - 4.0 ng/mL   Troponin I <0.30  <0.30 ng/mL   Relative Index RELATIVE INDEX IS INVALID  0.0 - 2.5  BASIC METABOLIC PANEL   Collection Time    09/16/11  4:17 AM      Result Value Range   Sodium 141  135 - 145 mEq/L   Potassium 4.0  3.5 - 5.1 mEq/L   Chloride 107  96 - 112 mEq/L   CO2 27  19 - 32 mEq/L   Glucose, Bld 92  70 - 99 mg/dL   BUN 15  6 - 23 mg/dL   Creatinine, Ser 8.11  0.50 - 1.10 mg/dL   Calcium 9.2  8.4 - 91.4 mg/dL   GFR calc non Af Amer 49 (*) >90 mL/min    GFR calc Af Amer 57 (*) >90 mL/min  CBC   Collection Time    09/16/11  4:17 AM      Result Value Range   WBC 6.9  4.0 - 10.5 K/uL   RBC 3.94  3.87 - 5.11 MIL/uL   Hemoglobin 11.5 (*) 12.0 - 15.0 g/dL   HCT 78.2  95.6 - 21.3 %   MCV 93.4  78.0 - 100.0 fL   MCH 29.2  26.0 - 34.0 pg   MCHC 31.3  30.0 - 36.0 g/dL   RDW 78.2  95.6 - 21.3 %   Platelets 186  150 - 400 K/uL  CARDIAC PANEL(CRET KIN+CKTOT+MB+TROPI)   Collection Time    09/16/11  8:37 AM      Result Value Range   Total CK 55  7 - 177 U/L   CK, MB 2.7  0.3 - 4.0 ng/mL   Troponin I <0.30  <0.30 ng/mL   Relative Index RELATIVE INDEX IS INVALID  0.0 - 2.5  She had some labs drawn in the [facility] in August.   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  Axis II deferred Axis III see medical history Axis IV mild to moderate Axis V 5-60  Plan: I took her vitals.  I reviewed CC, tobacco/med/surg Hx, meds effects/ side effects, problem list, therapies and responses as well as current situation/symptoms discussed options. Continue current effective medications. See orders and pt instructions for more details.  MEDICATIONS this encounter: Meds ordered this encounter  Medications  . vitamin E 400 UNIT capsule    Sig: Take 1 capsule (400 Units total) by mouth 2 (two) times daily.    Dispense:  60 capsule    Refill:  2  . risperiDONE (RISPERDAL) 1 MG tablet    Sig: Take 1 tablet (1 mg total) by mouth at bedtime.    Dispense:  30 tablet    Refill:  2  . risperiDONE (RISPERDAL) 0.25 MG tablet    Sig: TAKE ONE TO TWO TABLETS BY MOUTH UP TO TWICE DAILY.    Dispense:  120 tablet    Refill:  2  . PARoxetine (PAXIL) 40 MG tablet    Sig: Take 1 tablet (40 mg total) by mouth every morning.    Dispense:  30 tablet    Refill:  2  . PARoxetine (PAXIL) 20 MG tablet    Sig: Take 1 tablet (20 mg total) by mouth every morning.     Dispense:  30 tablet    Refill:  2  . lamoTRIgine (LAMICTAL) 25 MG tablet    Sig: Take 3 tablets (75 mg total) by mouth 2 (two) times daily.    Dispense:  180 tablet    Refill:  2  . benztropine (COGENTIN) 0.5 MG tablet    Sig: Take 1 tablet (0.5 mg total) by mouth 2 (two) times daily.    Dispense:  60 tablet    Refill:  2    Medical Decision Making Problem Points:  Established problem, stable/improving (1), Review of last therapy session (1) and Review of psycho-social stressors (1) Data Points:  Review or order clinical lab tests (1) Review of medication regiment & side effects (2)  I certify that outpatient services furnished can reasonably be expected to improve the patient's condition.   Orson Aloe, MD, [facility]  Addendum:  08/27/2012 Sister called to clarify meds.  She had gotten depressed with the reduction of Risperdal.  Sister will go back up to two a day of the low dose Risperdal.  Her shaking was some better with 1 a day of Cogentin.  Will send script to pharmacy increasing that to 60 a month to cover enough pills.  Discussed her labs and they were pretty good.   Orson Aloe, MD, [facility]

## 2012-08-28 ENCOUNTER — Ambulatory Visit (HOSPITAL_COMMUNITY): Payer: Self-pay | Admitting: Psychiatry

## 2012-08-31 DIAGNOSIS — M79609 Pain in unspecified limb: Secondary | ICD-10-CM | POA: Diagnosis not present

## 2012-08-31 DIAGNOSIS — B351 Tinea unguium: Secondary | ICD-10-CM | POA: Diagnosis not present

## 2012-08-31 DIAGNOSIS — Q828 Other specified congenital malformations of skin: Secondary | ICD-10-CM | POA: Diagnosis not present

## 2012-10-14 DIAGNOSIS — Q828 Other specified congenital malformations of skin: Secondary | ICD-10-CM | POA: Diagnosis not present

## 2012-10-14 DIAGNOSIS — L03039 Cellulitis of unspecified toe: Secondary | ICD-10-CM | POA: Diagnosis not present

## 2012-10-14 DIAGNOSIS — L02619 Cutaneous abscess of unspecified foot: Secondary | ICD-10-CM | POA: Diagnosis not present

## 2012-10-22 ENCOUNTER — Ambulatory Visit (HOSPITAL_COMMUNITY)
Admission: RE | Admit: 2012-10-22 | Discharge: 2012-10-22 | Disposition: A | Discharge status: Home / Self Care | Payer: Medicare Other | Source: Ambulatory Visit | Attending: Family Medicine | Admitting: Family Medicine

## 2012-10-22 ENCOUNTER — Other Ambulatory Visit (HOSPITAL_COMMUNITY): Payer: Self-pay | Admitting: Family Medicine

## 2012-10-22 DIAGNOSIS — Z1231 Encounter for screening mammogram for malignant neoplasm of breast: Secondary | ICD-10-CM | POA: Insufficient documentation

## 2012-10-22 DIAGNOSIS — Z139 Encounter for screening, unspecified: Secondary | ICD-10-CM

## 2012-11-20 DIAGNOSIS — H5231 Anisometropia: Secondary | ICD-10-CM | POA: Diagnosis not present

## 2012-11-20 DIAGNOSIS — H353190 Nonexudative age-related macular degeneration, unspecified eye, stage unspecified: Secondary | ICD-10-CM | POA: Diagnosis not present

## 2012-11-20 DIAGNOSIS — H52229 Regular astigmatism, unspecified eye: Secondary | ICD-10-CM | POA: Diagnosis not present

## 2012-11-20 DIAGNOSIS — H524 Presbyopia: Secondary | ICD-10-CM | POA: Diagnosis not present

## 2012-11-23 DIAGNOSIS — B351 Tinea unguium: Secondary | ICD-10-CM | POA: Diagnosis not present

## 2012-11-23 DIAGNOSIS — M79609 Pain in unspecified limb: Secondary | ICD-10-CM | POA: Diagnosis not present

## 2012-11-23 DIAGNOSIS — L03039 Cellulitis of unspecified toe: Secondary | ICD-10-CM | POA: Diagnosis not present

## 2012-11-24 ENCOUNTER — Other Ambulatory Visit: Payer: Self-pay | Admitting: Podiatry

## 2012-11-24 ENCOUNTER — Telehealth: Payer: Self-pay | Admitting: Podiatry

## 2012-11-24 DIAGNOSIS — R0989 Other specified symptoms and signs involving the circulatory and respiratory systems: Secondary | ICD-10-CM

## 2012-11-26 ENCOUNTER — Ambulatory Visit (INDEPENDENT_AMBULATORY_CARE_PROVIDER_SITE_OTHER): Payer: Medicare Other | Admitting: Psychiatry

## 2012-11-26 ENCOUNTER — Ambulatory Visit (HOSPITAL_COMMUNITY): Payer: Self-pay | Admitting: Psychiatry

## 2012-11-26 ENCOUNTER — Encounter (HOSPITAL_COMMUNITY): Payer: Self-pay | Admitting: Psychiatry

## 2012-11-26 VITALS — BP 140/101 | HR 96 | Wt 212.0 lb

## 2012-11-26 DIAGNOSIS — F5105 Insomnia due to other mental disorder: Secondary | ICD-10-CM | POA: Diagnosis not present

## 2012-11-26 DIAGNOSIS — F331 Major depressive disorder, recurrent, moderate: Secondary | ICD-10-CM

## 2012-11-26 DIAGNOSIS — F489 Nonpsychotic mental disorder, unspecified: Secondary | ICD-10-CM

## 2012-11-26 MED ORDER — PAROXETINE HCL 20 MG PO TABS: 20.0000 mg | ORAL_TABLET | ORAL | Status: DC

## 2012-11-26 MED ORDER — RISPERIDONE 0.25 MG PO TABS: ORAL_TABLET | ORAL | Status: DC

## 2012-11-26 MED ORDER — RISPERIDONE 1 MG PO TABS: 1.0000 mg | ORAL_TABLET | Freq: Every day | ORAL | Status: DC

## 2012-11-26 MED ORDER — BENZTROPINE MESYLATE 0.5 MG PO TABS: 0.5000 mg | ORAL_TABLET | Freq: Two times a day (BID) | ORAL | Status: DC

## 2012-11-26 MED ORDER — LAMOTRIGINE 25 MG PO TABS: 100.0000 mg | ORAL_TABLET | Freq: Two times a day (BID) | ORAL | Status: DC

## 2012-11-26 MED ORDER — PAROXETINE HCL 40 MG PO TABS: 40.0000 mg | ORAL_TABLET | ORAL | Status: DC

## 2012-11-26 NOTE — Progress Notes (Signed)
[facility] [id number] Progress Note BETZAYDA BRAXTON MRN: [id number] DOB: 24-Jul-1940 Age: 72 y.o.  Date: 11/26/2012  Chief Complaint  Patient presents with  . Medication Problem  . Follow-up  . Fatigue  . Medication Refill  . Memory Loss   History of presenting illness Patient is 72 year old Caucasian female who came with her sister for her followup appointment.  Patient is complaining of restlessness and anxiety .  She cannot sit down because she has to pace .  She's feeling very tired groggy and having poor attention and concentration.  She also endorsed family issues and do not remember things very well.  Her sister who is also very old trying to help her most of the time.  She sleeping on and off.  She gets sometimes confused.  She has tremors and shakes.  She does not feel as depressed however she is very concerned about her physical health.  She's taking multiple psychotropic medication.  She has any crying spells or any active or passive suicidal thoughts.  She's not drinking or using any illegal substance.  Past psychiatric history Patient is seeing psychiatrist since 1997 in this office.  Her last psychiatric admission was at [facility] after having suicidal thoughts.  She denies any history of suicidal attempt however endorse history of hopeless feeling.  She is diagnosed with major depressive disorder. She she was very stable on lithium until her creatinine went up and lithium was discontinued.  We had recently tried Lamictal and the dose has been increased recently.  In the past we had tried BuSpar which was discontinued at hospital.  She has another psychiatric inpatient treatment in 90s. She admitted history of mania, depression and psychosis.  Social history Patient lives with her sister.  She has no children.  Her husband died in nursing home.  Patient has no other family.  Medical history Patient was recently admitted due to dehydration and UTI.   Patient has history of arthritis blood pressure and chronic pain. Her primary care physician is Dr. Phillips Odor at [facility].  She takes medication for her blood pressure and arthritis.  She had history of jerking movements, fall and generalized pain. Her last blood work was done on 02/14/2012 which shows anemia, her creatinine was 1.33.  She was given antibiotic result UTI.  Her WBC count was 10.4.  Family History family history includes Anxiety disorder in her paternal aunt; Arthritis in an unspecified family member; Asthma in an unspecified family member; and Diabetes in an unspecified family member.  There is no history of Colon cancer.  Review of Systems  Constitutional: Positive for malaise/fatigue.  Skin: Negative.   Neurological: Positive for tremors, weakness and headaches.  Psychiatric/Behavioral: Positive for memory loss. Negative for suicidal ideas, hallucinations and substance abuse. The patient is nervous/anxious and has insomnia.      Mental status examination Patient is casually dressed and fairly groomed.  She appears tired .  She described her mood is anxious.  Due to the conversation she remains standing .  She told when she sits than she has an urge to stand.  Her affect is constricted.  Her attention and concentration is poor.  She has poverty of thought content.  She doesn't auditory or visual hallucination.  There were no paranoia or delusion obsession present at this time.  Her fund of knowledge is okay.  There are tremors and shakes in her hand.  She's alert and oriented x3.  Her insight judgment and impulse  control is okay.  Lab Results:  Results for orders placed during the hospital encounter of 07/22/12 (from the past 8736 hour(s))  CBC WITH DIFFERENTIAL   Collection Time    07/22/12  4:56 PM      Result Value Range   WBC 8.0  4.0 - 10.5 K/uL   RBC 4.01  3.87 - 5.11 MIL/uL   Hemoglobin 12.3  12.0 - 15.0 g/dL   HCT 96.0  45.4 - 09.8 %   MCV 95.3  78.0  - 100.0 fL   MCH 30.7  26.0 - 34.0 pg   MCHC 32.2  30.0 - 36.0 g/dL   RDW 11.9  14.7 - 82.9 %   Platelets 189  150 - 400 K/uL   Neutrophils Relative % 82 (*) 43 - 77 %   Neutro Abs 6.5  1.7 - 7.7 K/uL   Lymphocytes Relative 10 (*) 12 - 46 %   Lymphs Abs 0.8  0.7 - 4.0 K/uL   Monocytes Relative 7  3 - 12 %   Monocytes Absolute 0.5  0.1 - 1.0 K/uL   Eosinophils Relative 1  0 - 5 %   Eosinophils Absolute 0.1  0.0 - 0.7 K/uL   Basophils Relative 0  0 - 1 %   Basophils Absolute 0.0  0.0 - 0.1 K/uL  COMPREHENSIVE METABOLIC PANEL   Collection Time    07/22/12  4:56 PM      Result Value Range   Sodium 138  135 - 145 mEq/L   Potassium 4.4  3.5 - 5.1 mEq/L   Chloride 102  96 - 112 mEq/L   CO2 28  19 - 32 mEq/L   Glucose, Bld 108 (*) 70 - 99 mg/dL   BUN 18  6 - 23 mg/dL   Creatinine, Ser 5.62 (*) 0.50 - 1.10 mg/dL   Calcium 13.0  8.4 - 86.5 mg/dL   Total Protein 6.5  6.0 - 8.3 g/dL   Albumin 3.2 (*) 3.5 - 5.2 g/dL   AST 13  0 - 37 U/L   ALT 7  0 - 35 U/L   Alkaline Phosphatase 96  39 - 117 U/L   Total Bilirubin 0.2 (*) 0.3 - 1.2 mg/dL   GFR calc non Af Amer 37 (*) >90 mL/min   GFR calc Af Amer 43 (*) >90 mL/min  URINE CULTURE   Collection Time    07/22/12  5:51 PM      Result Value Range   Specimen Description URINE, CATHETERIZED     Special Requests NONE     Culture  Setup Time 07/22/2012 18:35     Colony Count NO GROWTH     Culture NO GROWTH     Report Status 07/23/2012 FINAL    URINALYSIS, ROUTINE W REFLEX MICROSCOPIC   Collection Time    07/22/12  5:51 PM      Result Value Range   Color, Urine YELLOW  YELLOW   APPearance CLEAR  CLEAR   Specific Gravity, Urine 1.010  1.005 - 1.030   pH 6.0  5.0 - 8.0   Glucose, UA NEGATIVE  NEGATIVE mg/dL   Hgb urine dipstick NEGATIVE  NEGATIVE   Bilirubin Urine NEGATIVE  NEGATIVE   Ketones, ur NEGATIVE  NEGATIVE mg/dL   Protein, ur NEGATIVE  NEGATIVE mg/dL   Urobilinogen, UA 0.2  0.0 - 1.0 mg/dL   Nitrite NEGATIVE  NEGATIVE    Leukocytes, UA SMALL (*) NEGATIVE  URINE MICROSCOPIC-ADD ON   Collection Time  07/22/12  5:51 PM      Result Value Range   WBC, UA 3-6  <3 WBC/hpf   RBC / HPF 0-2  <3 RBC/hpf   Bacteria, UA MANY (*) RARE  Results for orders placed in visit on 07/02/12 (from the past 8736 hour(s))  COMPREHENSIVE METABOLIC PANEL   Collection Time    07/02/12  1:45 PM      Result Value Range   Sodium 138  135 - 145 mEq/L   Potassium 4.6  3.5 - 5.3 mEq/L   Chloride 104  96 - 112 mEq/L   CO2 28  19 - 32 mEq/L   Glucose, Bld 138 (*) 70 - 99 mg/dL   BUN 27 (*) 6 - 23 mg/dL   Creat 1.61 (*) 0.96 - 1.10 mg/dL   Total Bilirubin 0.4  0.3 - 1.2 mg/dL   Alkaline Phosphatase 86  39 - 117 U/L   AST 17  0 - 37 U/L   ALT <8  0 - 35 U/L   Total Protein 6.3  6.0 - 8.3 g/dL   Albumin 3.7  3.5 - 5.2 g/dL   Calcium 9.7  8.4 - 04.5 mg/dL  CBC WITH DIFFERENTIAL   Collection Time    07/02/12  1:45 PM      Result Value Range   WBC 7.3  4.0 - 10.5 K/uL   RBC 3.87  3.87 - 5.11 MIL/uL   Hemoglobin 11.5 (*) 12.0 - 15.0 g/dL   HCT 40.9 (*) 81.1 - 91.4 %   MCV 92.2  78.0 - 100.0 fL   MCH 29.7  26.0 - 34.0 pg   MCHC 32.2  30.0 - 36.0 g/dL   RDW 78.2  95.6 - 21.3 %   Platelets 250  150 - 400 K/uL   Neutrophils Relative % 81 (*) 43 - 77 %   Neutro Abs 5.9  1.7 - 7.7 K/uL   Lymphocytes Relative 11 (*) 12 - 46 %   Lymphs Abs 0.8  0.7 - 4.0 K/uL   Monocytes Relative 7  3 - 12 %   Monocytes Absolute 0.5  0.1 - 1.0 K/uL   Eosinophils Relative 1  0 - 5 %   Eosinophils Absolute 0.1  0.0 - 0.7 K/uL   Basophils Relative 0  0 - 1 %   Basophils Absolute 0.0  0.0 - 0.1 K/uL   Smear Review Criteria for review not met    HEMOGLOBIN A1C   Collection Time    07/02/12  1:45 PM      Result Value Range   Hemoglobin A1C 5.5  <5.7 %   Mean Plasma Glucose 111  <117 mg/dL  Results for orders placed during the hospital encounter of 02/12/12 (from the past 8736 hour(s))  CBC WITH DIFFERENTIAL   Collection Time    02/12/12  5:45 PM       Result Value Range   WBC 9.2  4.0 - 10.5 K/uL   RBC 3.63 (*) 3.87 - 5.11 MIL/uL   Hemoglobin 11.0 (*) 12.0 - 15.0 g/dL   HCT 08.6 (*) 57.8 - 46.9 %   MCV 95.6  78.0 - 100.0 fL   MCH 30.3  26.0 - 34.0 pg   MCHC 31.7  30.0 - 36.0 g/dL   RDW 62.9  52.8 - 41.3 %   Platelets 162  150 - 400 K/uL   Neutrophils Relative % 88 (*) 43 - 77 %   Neutro Abs 8.2 (*) 1.7 - 7.7 K/uL  Lymphocytes Relative 6 (*) 12 - 46 %   Lymphs Abs 0.5 (*) 0.7 - 4.0 K/uL   Monocytes Relative 5  3 - 12 %   Monocytes Absolute 0.5  0.1 - 1.0 K/uL   Eosinophils Relative 1  0 - 5 %   Eosinophils Absolute 0.1  0.0 - 0.7 K/uL   Basophils Relative 0  0 - 1 %   Basophils Absolute 0.0  0.0 - 0.1 K/uL  COMPREHENSIVE METABOLIC PANEL   Collection Time    02/12/12  5:45 PM      Result Value Range   Sodium 140  135 - 145 mEq/L   Potassium 4.8  3.5 - 5.1 mEq/L   Chloride 108  96 - 112 mEq/L   CO2 25  19 - 32 mEq/L   Glucose, Bld 99  70 - 99 mg/dL   BUN 20  6 - 23 mg/dL   Creatinine, Ser 1.61 (*) 0.50 - 1.10 mg/dL   Calcium 9.6  8.4 - 09.6 mg/dL   Total Protein 6.2  6.0 - 8.3 g/dL   Albumin 3.0 (*) 3.5 - 5.2 g/dL   AST 14  0 - 37 U/L   ALT 9  0 - 35 U/L   Alkaline Phosphatase 89  39 - 117 U/L   Total Bilirubin 0.2 (*) 0.3 - 1.2 mg/dL   GFR calc non Af Amer 42 (*) >90 mL/min   GFR calc Af Amer 48 (*) >90 mL/min  PROTIME-INR   Collection Time    02/12/12  5:45 PM      Result Value Range   Prothrombin Time 14.3  11.6 - 15.2 seconds   INR 1.13  0.00 - 1.49  APTT   Collection Time    02/12/12  5:45 PM      Result Value Range   aPTT 29  24 - 37 seconds  POCT I-STAT TROPONIN I   Collection Time    02/12/12  6:05 PM      Result Value Range   Troponin i, poc 0.00  0.00 - 0.08 ng/mL   Comment 3           URINE CULTURE   Collection Time    02/12/12  6:09 PM      Result Value Range   Specimen Description URINE, CATHETERIZED     Special Requests ADDED [phone]     Culture  Setup Time 02/12/2012 21:15      Colony Count NO GROWTH     Culture NO GROWTH     Report Status 02/13/2012 FINAL    URINALYSIS, ROUTINE W REFLEX MICROSCOPIC   Collection Time    02/12/12  6:09 PM      Result Value Range   Color, Urine YELLOW  YELLOW   APPearance CLEAR  CLEAR   Specific Gravity, Urine 1.011  1.005 - 1.030   pH 6.5  5.0 - 8.0   Glucose, UA NEGATIVE  NEGATIVE mg/dL   Hgb urine dipstick NEGATIVE  NEGATIVE   Bilirubin Urine NEGATIVE  NEGATIVE   Ketones, ur NEGATIVE  NEGATIVE mg/dL   Protein, ur NEGATIVE  NEGATIVE mg/dL   Urobilinogen, UA 1.0  0.0 - 1.0 mg/dL   Nitrite NEGATIVE  NEGATIVE   Leukocytes, UA MODERATE (*) NEGATIVE  URINE MICROSCOPIC-ADD ON   Collection Time    02/12/12  6:09 PM      Result Value Range   Squamous Epithelial / LPF FEW (*) RARE   WBC, UA 11-20  <3  WBC/hpf   RBC / HPF 0-2  <3 RBC/hpf   Bacteria, UA MANY (*) RARE  CBC   Collection Time    02/13/12  5:35 AM      Result Value Range   WBC 9.3  4.0 - 10.5 K/uL   RBC 3.54 (*) 3.87 - 5.11 MIL/uL   Hemoglobin 10.7 (*) 12.0 - 15.0 g/dL   HCT 45.4 (*) 09.8 - 11.9 %   MCV 96.0  78.0 - 100.0 fL   MCH 30.2  26.0 - 34.0 pg   MCHC 31.5  30.0 - 36.0 g/dL   RDW 14.7  82.9 - 56.2 %   Platelets 181  150 - 400 K/uL  BASIC METABOLIC PANEL   Collection Time    02/13/12  5:35 AM      Result Value Range   Sodium 142  135 - 145 mEq/L   Potassium 4.5  3.5 - 5.1 mEq/L   Chloride 110  96 - 112 mEq/L   CO2 26  19 - 32 mEq/L   Glucose, Bld 104 (*) 70 - 99 mg/dL   BUN 22  6 - 23 mg/dL   Creatinine, Ser 1.30 (*) 0.50 - 1.10 mg/dL   Calcium 9.3  8.4 - 86.5 mg/dL   GFR calc non Af Amer 40 (*) >90 mL/min   GFR calc Af Amer 46 (*) >90 mL/min  BASIC METABOLIC PANEL   Collection Time    02/14/12  4:30 AM      Result Value Range   Sodium 143  135 - 145 mEq/L   Potassium 3.9  3.5 - 5.1 mEq/L   Chloride 109  96 - 112 mEq/L   CO2 26  19 - 32 mEq/L   Glucose, Bld 98  70 - 99 mg/dL   BUN 18  6 - 23 mg/dL   Creatinine, Ser 7.84 (*) 0.50 - 1.10  mg/dL   Calcium 9.9  8.4 - 69.6 mg/dL   GFR calc non Af Amer 39 (*) >90 mL/min   GFR calc Af Amer 45 (*) >90 mL/min  CBC   Collection Time    02/14/12  4:30 AM      Result Value Range   WBC 10.4  4.0 - 10.5 K/uL   RBC 3.70 (*) 3.87 - 5.11 MIL/uL   Hemoglobin 11.1 (*) 12.0 - 15.0 g/dL   HCT 29.5 (*) 28.4 - 13.2 %   MCV 94.6  78.0 - 100.0 fL   MCH 30.0  26.0 - 34.0 pg   MCHC 31.7  30.0 - 36.0 g/dL   RDW 44.0  10.2 - 72.5 %   Platelets 171  150 - 400 K/uL  Results for orders placed during the hospital encounter of 12/30/11 (from the past 8736 hour(s))  URINE RAPID DRUG SCREEN (HOSP PERFORMED)   Collection Time    12/30/11  7:36 PM      Result Value Range   Opiates NONE DETECTED  NONE DETECTED   Cocaine NONE DETECTED  NONE DETECTED   Benzodiazepines NONE DETECTED  NONE DETECTED   Amphetamines NONE DETECTED  NONE DETECTED   Tetrahydrocannabinol NONE DETECTED  NONE DETECTED   Barbiturates NONE DETECTED  NONE DETECTED  CBC WITH DIFFERENTIAL   Collection Time    12/30/11  7:44 PM      Result Value Range   WBC 7.0  4.0 - 10.5 K/uL   RBC 4.65  3.87 - 5.11 MIL/uL   Hemoglobin 14.0  12.0 - 15.0 g/dL  HCT 42.9  36.0 - 46.0 %   MCV 92.3  78.0 - 100.0 fL   MCH 30.1  26.0 - 34.0 pg   MCHC 32.6  30.0 - 36.0 g/dL   RDW 16.1  09.6 - 04.5 %   Platelets 219  150 - 400 K/uL   Neutrophils Relative % 78 (*) 43 - 77 %   Neutro Abs 5.5  1.7 - 7.7 K/uL   Lymphocytes Relative 15  12 - 46 %   Lymphs Abs 1.0  0.7 - 4.0 K/uL   Monocytes Relative 6  3 - 12 %   Monocytes Absolute 0.4  0.1 - 1.0 K/uL   Eosinophils Relative 1  0 - 5 %   Eosinophils Absolute 0.1  0.0 - 0.7 K/uL   Basophils Relative 0  0 - 1 %   Basophils Absolute 0.0  0.0 - 0.1 K/uL  BASIC METABOLIC PANEL   Collection Time    12/30/11  7:44 PM      Result Value Range   Sodium 139  135 - 145 mEq/L   Potassium 3.7  3.5 - 5.1 mEq/L   Chloride 103  96 - 112 mEq/L   CO2 26  19 - 32 mEq/L   Glucose, Bld 92  70 - 99 mg/dL   BUN 16   6 - 23 mg/dL   Creatinine, Ser 4.09 (*) 0.50 - 1.10 mg/dL   Calcium 81.1 (*) 8.4 - 10.5 mg/dL   GFR calc non Af Amer 44 (*) >90 mL/min   GFR calc Af Amer 51 (*) >90 mL/min  ETHANOL   Collection Time    12/30/11  7:44 PM      Result Value Range   Alcohol, Ethyl (B) <11  0 - 11 mg/dL  Results for orders placed during the hospital encounter of 12/13/11 (from the past 8736 hour(s))  CBC WITH DIFFERENTIAL   Collection Time    12/13/11  3:50 PM      Result Value Range   WBC 7.5  4.0 - 10.5 K/uL   RBC 4.01  3.87 - 5.11 MIL/uL   Hemoglobin 12.2  12.0 - 15.0 g/dL   HCT 91.4  78.2 - 95.6 %   MCV 93.3  78.0 - 100.0 fL   MCH 30.4  26.0 - 34.0 pg   MCHC 32.6  30.0 - 36.0 g/dL   RDW 21.3  08.6 - 57.8 %   Platelets 206  150 - 400 K/uL   Neutrophils Relative % 80 (*) 43 - 77 %   Neutro Abs 6.0  1.7 - 7.7 K/uL   Lymphocytes Relative 13  12 - 46 %   Lymphs Abs 1.0  0.7 - 4.0 K/uL   Monocytes Relative 6  3 - 12 %   Monocytes Absolute 0.5  0.1 - 1.0 K/uL   Eosinophils Relative 1  0 - 5 %   Eosinophils Absolute 0.1  0.0 - 0.7 K/uL   Basophils Relative 0  0 - 1 %   Basophils Absolute 0.0  0.0 - 0.1 K/uL  COMPREHENSIVE METABOLIC PANEL   Collection Time    12/13/11  3:50 PM      Result Value Range   Sodium 137  135 - 145 mEq/L   Potassium 4.6  3.5 - 5.1 mEq/L   Chloride 103  96 - 112 mEq/L   CO2 25  19 - 32 mEq/L   Glucose, Bld 126 (*) 70 - 99 mg/dL   BUN 17  6 -  23 mg/dL   Creatinine, Ser 4.09 (*) 0.50 - 1.10 mg/dL   Calcium 81.1  8.4 - 91.4 mg/dL   Total Protein 7.1  6.0 - 8.3 g/dL   Albumin 3.6  3.5 - 5.2 g/dL   AST 21  0 - 37 U/L   ALT 10  0 - 35 U/L   Alkaline Phosphatase 89  39 - 117 U/L   Total Bilirubin 0.3  0.3 - 1.2 mg/dL   GFR calc non Af Amer 40 (*) >90 mL/min   GFR calc Af Amer 46 (*) >90 mL/min  URINALYSIS, ROUTINE W REFLEX MICROSCOPIC   Collection Time    12/13/11  4:45 PM      Result Value Range   Color, Urine YELLOW  YELLOW   APPearance CLEAR  CLEAR   Specific  Gravity, Urine 1.015  1.005 - 1.030   pH 7.0  5.0 - 8.0   Glucose, UA NEGATIVE  NEGATIVE mg/dL   Hgb urine dipstick NEGATIVE  NEGATIVE   Bilirubin Urine NEGATIVE  NEGATIVE   Ketones, ur NEGATIVE  NEGATIVE mg/dL   Protein, ur NEGATIVE  NEGATIVE mg/dL   Urobilinogen, UA 0.2  0.0 - 1.0 mg/dL   Nitrite NEGATIVE  NEGATIVE   Leukocytes, UA NEGATIVE  NEGATIVE   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  Axis II deferred Axis III see medical history Axis IV mild to moderate Axis V 5-60  Plan: I review her chart, medication and response to medication.  Her risperidone was recently increased to 1 mg at bedtime and 0.25 mg to tablet twice a day.  I do believe patient is having akathisia due to risperidone.  I recommend to stop risperidone in the afternoon and take only one tablet of 0.25 in the morning.  Recommend to take risperidone 1 mg at bedtime.  I would increase Lamictal 100 mg twice a day.  We'll continue Paxil her present dose.  Recommend if she does not see any improvement in call us back immediately.  Discussed the risk and benefits of the medication in detail.  Recommend followup in 4 weeks.  Time spent 25 minutes.  More than 50% of the time spent and psychoeducation, counseling and coordination of care.  MEDICATIONS this encounter: Meds ordered this encounter  Medications  . risperiDONE (RISPERDAL) 1 MG tablet    Sig: Take 1 tablet (1 mg total) by mouth at bedtime.    Dispense:  30 tablet    Refill:  2  . risperiDONE (RISPERDAL) 0.25 MG tablet    Sig: Take 1 in am with breakfast and than may need 2nd in afternoon.    Dispense:  45 tablet    Refill:  2  . lamoTRIgine (LAMICTAL) 25 MG tablet    Sig: Take 4 tablets (100 mg total) by mouth 2 (two) times daily.    Dispense:  60 tablet    Refill:  1  . PARoxetine (PAXIL) 40 MG tablet    Sig: Take 1 tablet (40 mg total) by mouth every morning.     Dispense:  30 tablet    Refill:  1  . PARoxetine (PAXIL) 20 MG tablet    Sig: Take 1 tablet (20 mg total) by mouth every morning.    Dispense:  30 tablet    Refill:  1  . benztropine (COGENTIN) 0.5 MG tablet    Sig: Take 1 tablet (0.5 mg total) by mouth 2 (two) times daily.  Dispense:  60 tablet    Refill:  1    Medical Decision Making Problem Points:  Established problem, stable/improving (1), Established problem, worsening (2), New problem, with additional work-up planned (4), Review of last therapy session (1) and Review of psycho-social stressors (1) Data Points:  Review of medication regiment & side effects (2) Review of new medications or change in dosage (2)  I certify that outpatient services furnished can reasonably be expected to improve the patient's condition.   Damain Broadus T., MD

## 2012-11-27 DIAGNOSIS — L97509 Non-pressure chronic ulcer of other part of unspecified foot with unspecified severity: Secondary | ICD-10-CM | POA: Diagnosis not present

## 2012-11-29 ENCOUNTER — Emergency Department (HOSPITAL_COMMUNITY): Payer: Medicare Other

## 2012-11-29 ENCOUNTER — Emergency Department (HOSPITAL_COMMUNITY)
Admission: EM | Admit: 2012-11-29 | Discharge: 2012-11-29 | Disposition: A | Discharge status: Home / Self Care | Payer: Medicare Other | Attending: Emergency Medicine | Admitting: Emergency Medicine

## 2012-11-29 ENCOUNTER — Encounter (HOSPITAL_COMMUNITY): Payer: Self-pay | Admitting: Emergency Medicine

## 2012-11-29 DIAGNOSIS — Z7982 Long term (current) use of aspirin: Secondary | ICD-10-CM | POA: Insufficient documentation

## 2012-11-29 DIAGNOSIS — F319 Bipolar disorder, unspecified: Secondary | ICD-10-CM | POA: Diagnosis not present

## 2012-11-29 DIAGNOSIS — T1490XA Injury, unspecified, initial encounter: Secondary | ICD-10-CM | POA: Diagnosis not present

## 2012-11-29 DIAGNOSIS — S1093XA Contusion of unspecified part of neck, initial encounter: Secondary | ICD-10-CM | POA: Diagnosis not present

## 2012-11-29 DIAGNOSIS — S8000XA Contusion of unspecified knee, initial encounter: Secondary | ICD-10-CM | POA: Diagnosis not present

## 2012-11-29 DIAGNOSIS — M542 Cervicalgia: Secondary | ICD-10-CM | POA: Diagnosis not present

## 2012-11-29 DIAGNOSIS — F411 Generalized anxiety disorder: Secondary | ICD-10-CM | POA: Diagnosis not present

## 2012-11-29 DIAGNOSIS — S8001XA Contusion of right knee, initial encounter: Secondary | ICD-10-CM

## 2012-11-29 DIAGNOSIS — Z862 Personal history of diseases of the blood and blood-forming organs and certain disorders involving the immune mechanism: Secondary | ICD-10-CM | POA: Insufficient documentation

## 2012-11-29 DIAGNOSIS — S199XXA Unspecified injury of neck, initial encounter: Secondary | ICD-10-CM | POA: Diagnosis not present

## 2012-11-29 DIAGNOSIS — Y9389 Activity, other specified: Secondary | ICD-10-CM | POA: Insufficient documentation

## 2012-11-29 DIAGNOSIS — Z880 Allergy status to penicillin: Secondary | ICD-10-CM | POA: Diagnosis not present

## 2012-11-29 DIAGNOSIS — I10 Essential (primary) hypertension: Secondary | ICD-10-CM | POA: Insufficient documentation

## 2012-11-29 DIAGNOSIS — R51 Headache: Secondary | ICD-10-CM | POA: Diagnosis not present

## 2012-11-29 DIAGNOSIS — M129 Arthropathy, unspecified: Secondary | ICD-10-CM | POA: Diagnosis not present

## 2012-11-29 DIAGNOSIS — Y9289 Other specified places as the place of occurrence of the external cause: Secondary | ICD-10-CM | POA: Insufficient documentation

## 2012-11-29 DIAGNOSIS — W07XXXA Fall from chair, initial encounter: Secondary | ICD-10-CM

## 2012-11-29 DIAGNOSIS — Z8679 Personal history of other diseases of the circulatory system: Secondary | ICD-10-CM | POA: Insufficient documentation

## 2012-11-29 DIAGNOSIS — Z79899 Other long term (current) drug therapy: Secondary | ICD-10-CM | POA: Insufficient documentation

## 2012-11-29 DIAGNOSIS — S0083XA Contusion of other part of head, initial encounter: Secondary | ICD-10-CM | POA: Insufficient documentation

## 2012-11-29 DIAGNOSIS — Z8639 Personal history of other endocrine, nutritional and metabolic disease: Secondary | ICD-10-CM | POA: Insufficient documentation

## 2012-11-29 DIAGNOSIS — S0990XA Unspecified injury of head, initial encounter: Secondary | ICD-10-CM | POA: Diagnosis not present

## 2012-11-29 DIAGNOSIS — S0003XA Contusion of scalp, initial encounter: Secondary | ICD-10-CM

## 2012-11-29 NOTE — ED Notes (Signed)
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  Patient tilted forward and landed on her knees and fell forward hitting her head on a wood stove.  Patient has hematoma to top of head.

## 2012-11-29 NOTE — ED Provider Notes (Signed)
History    CSN: [id number] Arrival date & time 11/29/12  4098  First MD Initiated Contact with Patient 11/29/12 [phone]     Chief Complaint  Patient presents with  . Fall  . Head Injury   (Consider location/radiation/quality/duration/timing/severity/associated sxs/prior Treatment) Patient is a 72 y.o. female presenting with fall and head injury. The history is provided by the patient.  Fall  Head Injury She fell while trying to get into a lift chair. She did hit her head and there is also complaining of pain in her right knee. Pain is moderate and she rates it at 5/10. There was no loss of consciousness. She denies nausea or vomiting. Past Medical History  Diagnosis Date  . High blood pressure   . Arthritis   . Depression   . Anxiety   . Hemorrhoids   . Hyperlipidemia   . Bipolar 1 disorder    Past Surgical History  Procedure Laterality Date  . Back surgery  03/16/87  . Cholecystectomy  08/15/93  . Partial hysterectomy  04/04/95  . Bladder tacked  04/04/95  . Ankle surgery  04/28/97    right ankle fracture  . Wrist surgery  02/24/09    right wrist fracture  . Colonoscopy  10/26/2002    Dr. Karilyn Cota- small external hemorrhoids otherwise normal   . Breast biopsy  05/16/95    right side  . Savory dilation  10/31/2011    Procedure: SAVORY DILATION;  Surgeon: Corbin Ade, MD;  Location: [facility];  Service: Endoscopy;  Laterality: N/A;  Elease Hashimoto dilation  10/31/2011    Procedure: Elease Hashimoto DILATION;  Surgeon: Corbin Ade, MD;  Location: [facility];  Service: Endoscopy;  Laterality: N/A;  . Abdominal hysterectomy     Family History  Problem Relation Age of Onset  . Arthritis    . Asthma    . Diabetes    . Colon cancer Neg Hx   . Anxiety disorder Paternal Aunt    History  Substance Use Topics  . Smoking status: Never Smoker   . Smokeless tobacco: Not on file  . Alcohol Use: No   OB History   Grav Para Term Preterm Abortions TAB SAB Ect Mult Living       Review of Systems  All other systems reviewed and are negative.    Allergies  Benzodiazepines; Ciprofloxacin; Cephalexin; and Penicillins  Home Medications   Current Outpatient Rx  Name  Route  Sig  Dispense  Refill  . acetaminophen (TYLENOL) 650 MG CR tablet   Oral   Take 650 mg by mouth every 8 (eight) hours as needed. For pain         . aspirin EC 81 MG tablet   Oral   Take 81 mg by mouth every morning.          . benztropine (COGENTIN) 0.5 MG tablet   Oral   Take 1 tablet (0.5 mg total) by mouth 2 (two) times daily.   60 tablet   1   . beta carotene w/minerals (OCUVITE) tablet   Oral   Take 1 tablet by mouth every morning.          . Biotin 5000 MCG CAPS   Oral   Take 1 capsule by mouth every morning.         . calcium carbonate (OS-CAL) 600 MG TABS   Oral   Take 600 mg by mouth 2 (two) times daily with a meal.          .  Cholecalciferol (VITAMIN D) 2000 UNITS CAPS   Oral   Take 1 capsule by mouth every morning.          . docusate sodium (STOOL SOFTENER) 100 MG capsule   Oral   Take 200 mg by mouth at bedtime.         Marland Kitchen ESTRACE VAGINAL 0.1 MG/GM vaginal cream   Vaginal   Place 2 g vaginally 2 (two) times a week.          . lamoTRIgine (LAMICTAL) 25 MG tablet   Oral   Take 4 tablets (100 mg total) by mouth 2 (two) times daily.   60 tablet   1   . losartan (COZAAR) 100 MG tablet   Oral   Take 100 mg by mouth every morning.          . meloxicam (MOBIC) 15 MG tablet   Oral   Take 15 mg by mouth daily as needed. For pain   *Sometimes patient uses Tylenol Arthritis*         . omeprazole (PRILOSEC) 20 MG capsule   Oral   Take 40 mg by mouth every morning.          Marland Kitchen PARoxetine (PAXIL) 20 MG tablet   Oral   Take 1 tablet (20 mg total) by mouth every morning.   30 tablet   1   . PARoxetine (PAXIL) 40 MG tablet   Oral   Take 1 tablet (40 mg total) by mouth every morning.   30 tablet   1   . risperiDONE (RISPERDAL)  0.25 MG tablet      Take 1 in am with breakfast and than may need 2nd in afternoon.   45 tablet   2   . risperiDONE (RISPERDAL) 1 MG tablet   Oral   Take 1 tablet (1 mg total) by mouth at bedtime.   30 tablet   2   . solifenacin (VESICARE) 10 MG tablet   Oral   Take 10 mg by mouth every morning.          . sulfamethoxazole-trimethoprim (SEPTRA DS) 800-160 MG per tablet   Oral   Take 1 tablet by mouth every 12 (twelve) hours.   14 tablet   0   . terazosin (HYTRIN) 2 MG capsule   Oral   Take 2 mg by mouth at bedtime. Take with the 1 mg capsule at bedtime         . vitamin B-12 (CYANOCOBALAMIN) 1000 MCG tablet   Oral   Take 1,000 mcg by mouth daily.         . vitamin E 400 UNIT capsule   Oral   Take 1 capsule (400 Units total) by mouth 2 (two) times daily.   60 capsule   2    BP 148/71  Pulse 75  Temp(Src) 97.7 F (36.5 C) (Oral)  Resp 20  Ht 5\' 6"  (1.676 m)  Wt 212 lb (96.163 kg)  BMI 34.23 kg/m2  SpO2 98% Physical Exam  Nursing note and vitals reviewed.  72 year old female, resting comfortably and in no acute distress. Vital signs are significant for mild hypertension with blood pressure 148/71. Oxygen saturation is 98%, which is normal. Head is normocephalic. There is tenderness and soft tissue swelling over the right occipitoparietal area. PERRLA, EOMI. Oropharynx is clear. Neck is nontender without adenopathy or JVD. Back is nontender and there is no CVA tenderness. Lungs are clear without rales, wheezes, or rhonchi. Chest is  nontender. Heart has regular rate and rhythm without murmur. Abdomen is soft, flat, nontender without masses or hepatosplenomegaly and peristalsis is normoactive. Extremities have no cyanosis or edema, full range of motion is present. There is mild swelling over the anterior aspect of the right knee. There is no instability and Lachman and varus tests are negative. Skin is warm and dry without rash. Neurologic: Mental status is  normal, cranial nerves are intact, there are no motor or sensory deficits.  ED Course  Procedures (including critical care time) Labs Reviewed - No data to display Ct Head Wo Contrast  11/29/2012   *RADIOLOGY REPORT*  Clinical Data:  Fall, anterior headache, posterior neck pain  CT HEAD WITHOUT CONTRAST CT CERVICAL SPINE WITHOUT CONTRAST  Technique:  Multidetector CT imaging of the head and cervical spine was performed following the standard protocol without intravenous contrast.  Multiplanar CT image reconstructions of the cervical spine were also generated.  Comparison:  CT head dated 02/12/2012.  CT HEAD  Findings: No evidence of parenchymal hemorrhage or extra-axial fluid collection. No mass lesion, mass effect, or midline shift.  No CT evidence of acute infarction.  Extensive subcortical white matter and periventricular small vessel ischemic changes.  Intracranial atherosclerosis.  Global cortical atrophy.  Secondary ventricular prominence.  Minimal mucosal thickening in the left maxillary sinus.  Visualized paranasal sinuses and mastoid air cells are otherwise clear.  Moderate extracranial hematoma overlying the right frontal vertex (series 2/image 27).  No evidence of calvarial fracture.  IMPRESSION: Moderate extracranial hematoma overlying the right frontal vertex.  No evidence of calvarial fracture.  Atrophy with small vessel ischemic changes.  CT CERVICAL SPINE  Findings: Normal cervical lordosis.  No evidence of fracture or dislocation.  Vertebral body heights are maintained.  The dens appears intact.  No prevertebral soft tissue swelling.  Mild to moderate multilevel degenerative changes.  Visualized thyroid is unremarkable.  IMPRESSION:  No evidence of traumatic injury to the cervical spine.  Mild to moderate multilevel degenerative changes.   Original Report Authenticated By: Charline Bills, M.D.   Ct Cervical Spine Wo Contrast  11/29/2012   *RADIOLOGY REPORT*  Clinical Data:  Fall, anterior  headache, posterior neck pain  CT HEAD WITHOUT CONTRAST CT CERVICAL SPINE WITHOUT CONTRAST  Technique:  Multidetector CT imaging of the head and cervical spine was performed following the standard protocol without intravenous contrast.  Multiplanar CT image reconstructions of the cervical spine were also generated.  Comparison:  CT head dated 02/12/2012.  CT HEAD  Findings: No evidence of parenchymal hemorrhage or extra-axial fluid collection. No mass lesion, mass effect, or midline shift.  No CT evidence of acute infarction.  Extensive subcortical white matter and periventricular small vessel ischemic changes.  Intracranial atherosclerosis.  Global cortical atrophy.  Secondary ventricular prominence.  Minimal mucosal thickening in the left maxillary sinus.  Visualized paranasal sinuses and mastoid air cells are otherwise clear.  Moderate extracranial hematoma overlying the right frontal vertex (series 2/image 27).  No evidence of calvarial fracture.  IMPRESSION: Moderate extracranial hematoma overlying the right frontal vertex.  No evidence of calvarial fracture.  Atrophy with small vessel ischemic changes.  CT CERVICAL SPINE  Findings: Normal cervical lordosis.  No evidence of fracture or dislocation.  Vertebral body heights are maintained.  The dens appears intact.  No prevertebral soft tissue swelling.  Mild to moderate multilevel degenerative changes.  Visualized thyroid is unremarkable.  IMPRESSION:  No evidence of traumatic injury to the cervical spine.  Mild to moderate multilevel  degenerative changes.   Original Report Authenticated By: Charline Bills, M.D.   Dg Knee Complete 4 Views Right  11/29/2012   *RADIOLOGY REPORT*  Clinical Data: Status post fall; right knee pain and anterior bruising.  RIGHT KNEE - COMPLETE 4+ VIEW  Comparison: None.  Findings: There is no evidence of fracture or dislocation.  The joint spaces are preserved.  Small tibial spine and wall osteophytes are seen.  An enthesophyte  is noted arising at the superior pole of the patella.  No significant joint effusion is seen.  Prominent infrapatellar soft tissue swelling is noted.  IMPRESSION:  1.  No evidence of fracture or dislocation. 2.  Minimal degenerative change at the right knee.   Original Report Authenticated By: Tonia Ghent, M.D.   1. Fall from chair, initial encounter   2. Scalp contusion, initial encounter   3. Contusion of right knee, initial encounter     MDM  Fall with head contusion and contusion of the right knee. X-rays and CT scans have been ordered.  CT and x-rays are negative for acute injury other than soft tissue swelling. She is discharged with instructions use over-the-counter analgesics as needed for pain.  Dione Booze, MD 11/29/12 [phone]

## 2012-11-30 DIAGNOSIS — L97509 Non-pressure chronic ulcer of other part of unspecified foot with unspecified severity: Secondary | ICD-10-CM | POA: Diagnosis not present

## 2012-12-01 ENCOUNTER — Ambulatory Visit (HOSPITAL_COMMUNITY)
Admission: RE | Admit: 2012-12-01 | Discharge: 2012-12-01 | Disposition: A | Discharge status: Home / Self Care | Payer: Medicare Other | Source: Ambulatory Visit | Attending: Podiatry | Admitting: Podiatry

## 2012-12-01 DIAGNOSIS — R0989 Other specified symptoms and signs involving the circulatory and respiratory systems: Secondary | ICD-10-CM

## 2012-12-01 DIAGNOSIS — L97509 Non-pressure chronic ulcer of other part of unspecified foot with unspecified severity: Secondary | ICD-10-CM | POA: Insufficient documentation

## 2012-12-01 NOTE — Progress Notes (Signed)
Lower Extremity Arterial Duplex Completed. °Amy Clay ° °

## 2012-12-08 DIAGNOSIS — L89899 Pressure ulcer of other site, unspecified stage: Secondary | ICD-10-CM | POA: Diagnosis not present

## 2012-12-08 DIAGNOSIS — Z6834 Body mass index (BMI) 34.0-34.9, adult: Secondary | ICD-10-CM | POA: Diagnosis not present

## 2012-12-09 DIAGNOSIS — L97509 Non-pressure chronic ulcer of other part of unspecified foot with unspecified severity: Secondary | ICD-10-CM | POA: Diagnosis not present

## 2012-12-11 ENCOUNTER — Telehealth (HOSPITAL_COMMUNITY): Payer: Self-pay | Admitting: Psychiatry

## 2012-12-11 DIAGNOSIS — F331 Major depressive disorder, recurrent, moderate: Secondary | ICD-10-CM

## 2012-12-14 MED ORDER — PAROXETINE HCL 20 MG PO TABS: 20.0000 mg | ORAL_TABLET | ORAL | Status: DC

## 2012-12-14 NOTE — Telephone Encounter (Signed)
Notified pharmacy that RX for Paroxetine 20 mg sent on 11/26/12 thru [facility]. Pharmacy states only RX for Paroxetine 40 mg received on that date, no RX for 20 mg received Prescription given verbally at this time for Paroxetine 20 mg.

## 2012-12-21 ENCOUNTER — Other Ambulatory Visit (HOSPITAL_COMMUNITY): Payer: Self-pay | Admitting: Psychiatry

## 2012-12-22 ENCOUNTER — Other Ambulatory Visit (HOSPITAL_COMMUNITY): Payer: Self-pay | Admitting: Psychiatry

## 2012-12-22 ENCOUNTER — Telehealth (HOSPITAL_COMMUNITY): Payer: Self-pay | Admitting: Psychiatry

## 2012-12-22 DIAGNOSIS — F331 Major depressive disorder, recurrent, moderate: Secondary | ICD-10-CM

## 2012-12-22 MED ORDER — LAMOTRIGINE 100 MG PO TABS: 100.0000 mg | ORAL_TABLET | Freq: Two times a day (BID) | ORAL | Status: DC

## 2012-12-22 NOTE — Telephone Encounter (Signed)
We will give Lamictal 100 mg twice a day.

## 2012-12-23 NOTE — Telephone Encounter (Signed)
Spoke with Acupuncturist at [facility]. They have new order for 100 mg Lamictal BID, will dispense when pt runs out of 25 mg tablets. He will call family to let them know

## 2012-12-30 DIAGNOSIS — L97509 Non-pressure chronic ulcer of other part of unspecified foot with unspecified severity: Secondary | ICD-10-CM | POA: Diagnosis not present

## 2012-12-30 DIAGNOSIS — L600 Ingrowing nail: Secondary | ICD-10-CM | POA: Insufficient documentation

## 2012-12-30 DIAGNOSIS — L02619 Cutaneous abscess of unspecified foot: Secondary | ICD-10-CM | POA: Diagnosis not present

## 2013-01-08 DIAGNOSIS — Z79899 Other long term (current) drug therapy: Secondary | ICD-10-CM | POA: Diagnosis not present

## 2013-01-08 DIAGNOSIS — R7301 Impaired fasting glucose: Secondary | ICD-10-CM | POA: Diagnosis not present

## 2013-01-08 DIAGNOSIS — Z6834 Body mass index (BMI) 34.0-34.9, adult: Secondary | ICD-10-CM | POA: Diagnosis not present

## 2013-01-08 DIAGNOSIS — R5382 Chronic fatigue, unspecified: Secondary | ICD-10-CM | POA: Diagnosis not present

## 2013-01-08 DIAGNOSIS — M810 Age-related osteoporosis without current pathological fracture: Secondary | ICD-10-CM | POA: Diagnosis not present

## 2013-01-08 DIAGNOSIS — E559 Vitamin D deficiency, unspecified: Secondary | ICD-10-CM | POA: Diagnosis not present

## 2013-01-08 DIAGNOSIS — E568 Deficiency of other vitamins: Secondary | ICD-10-CM | POA: Diagnosis not present

## 2013-01-08 DIAGNOSIS — I10 Essential (primary) hypertension: Secondary | ICD-10-CM | POA: Diagnosis not present

## 2013-01-12 ENCOUNTER — Encounter (HOSPITAL_COMMUNITY): Payer: Self-pay | Admitting: Psychiatry

## 2013-01-12 ENCOUNTER — Ambulatory Visit (INDEPENDENT_AMBULATORY_CARE_PROVIDER_SITE_OTHER): Payer: Medicare Other | Admitting: Psychiatry

## 2013-01-12 VITALS — BP 110/70 | Ht 66.0 in | Wt 198.0 lb

## 2013-01-12 DIAGNOSIS — F411 Generalized anxiety disorder: Secondary | ICD-10-CM | POA: Diagnosis not present

## 2013-01-12 DIAGNOSIS — F331 Major depressive disorder, recurrent, moderate: Secondary | ICD-10-CM

## 2013-01-12 DIAGNOSIS — F29 Unspecified psychosis not due to a substance or known physiological condition: Secondary | ICD-10-CM

## 2013-01-12 DIAGNOSIS — F5105 Insomnia due to other mental disorder: Secondary | ICD-10-CM

## 2013-01-12 DIAGNOSIS — F09 Unspecified mental disorder due to known physiological condition: Secondary | ICD-10-CM | POA: Diagnosis not present

## 2013-01-12 DIAGNOSIS — F329 Major depressive disorder, single episode, unspecified: Secondary | ICD-10-CM

## 2013-01-12 MED ORDER — BUPROPION HCL 75 MG PO TABS: 75.0000 mg | ORAL_TABLET | Freq: Every morning | ORAL | Status: DC

## 2013-01-12 MED ORDER — RISPERIDONE 0.25 MG PO TABS: ORAL_TABLET | ORAL | Status: DC

## 2013-01-12 MED ORDER — BENZTROPINE MESYLATE 0.5 MG PO TABS: 0.5000 mg | ORAL_TABLET | Freq: Two times a day (BID) | ORAL | Status: DC

## 2013-01-12 MED ORDER — RISPERIDONE 1 MG PO TABS: 1.0000 mg | ORAL_TABLET | Freq: Every day | ORAL | Status: DC

## 2013-01-12 MED ORDER — PAROXETINE HCL 40 MG PO TABS: 40.0000 mg | ORAL_TABLET | ORAL | Status: DC

## 2013-01-12 NOTE — Progress Notes (Signed)
Patient ID: Amy Clay, female   DOB: December 04, 1940, 72 y.o.   MRN: [id number] [facility] [address] Progress Note [place] STAY MRN: [id number] DOB: 09/01/1940 Age: 72 y.o.  Date: 01/12/2013  Chief Complaint  Patient presents with  . Anxiety  . Depression  . Memory Loss  . Medication Refill   History of presenting illness Patient is 72 year old Caucasian female who came with her sister for her followup appointment. She and her sister live together in the family farm outside of [place]. Neither one has ever been married or have had children. The patient worked until the early 80s when she began to have difficulties with her nerves.  Currently the patient is still struggling with her mental and physical health. She was admitted last year to a psychiatric hospital in [place] because she was anxious nervous and depressed. Last time Dr. Lolly Mustache increased her Risperdal which is helped somewhat. She can't walk and it's not clear why much of it seems to be anxiety based and she seems to have a fear of falling. She's also recently developed possible renal failure and is can be referred to a nephrologist. She was on lithium for a number of years. We do not have her leak recent records from her primary care physician who is outside of [place].  Currently the patient is still depressed despite being on 60 mg of Paxil a day. She has no energy. She sits and watches TV all the time and worries constantly. She's not suicidal but her mood is low. Her memory has been failing her and she gets confused in the afternoons and agitated. A recent brain CT indicated a atrophy and significant white matter disease which is consistent with early dementia  Past psychiatric history Patient is seeing psychiatrist since 1997 in this office.  Her last psychiatric admission was at [facility] after having suicidal thoughts.  She denies any history of suicidal attempt however endorse  history of hopeless feeling.  She is diagnosed with major depressive disorder. She she was very stable on lithium until her creatinine went up and lithium was discontinued.  We had recently tried Lamictal and the dose has been increased recently.  In the past we had tried BuSpar which was discontinued at hospital.  She has another psychiatric inpatient treatment in 90s. She admitted history of mania, depression and psychosis.  Social history Patient lives with her sister.  She has no children.  Her husband died in nursing home.  Patient has no other family.  Medical history Patient was recently admitted due to dehydration and UTI.  Patient has history of arthritis blood pressure and chronic pain. Her primary care physician is Dr. Phillips Odor at [facility].  She takes medication for her blood pressure and arthritis.  She had history of jerking movements, fall and generalized pain. Her last blood work was done on 02/14/2012 which shows anemia, her creatinine was 1.33.  She was given antibiotic result UTI.  Her WBC count was 10.4.  Family History family history includes Anxiety disorder in her paternal aunt and paternal uncle; Arthritis in an other family member; Asthma in an other family member; Bipolar disorder in her cousin; Diabetes in an other family member. There is no history of Colon cancer.  Review of Systems  Constitutional: Positive for malaise/fatigue.  Skin: Negative.   Neurological: Positive for tremors, weakness and headaches.  Psychiatric/Behavioral: Positive for memory loss. Negative for suicidal ideas, hallucinations and substance abuse. The patient is nervous/anxious. The  patient does not have insomnia.      Mental status examination Patient is casually dressed and fairly groomed.  She appears tired .  She described her mood is anxious.  Her voice is high-pitched and soft. She often doesn't remember the answers to questions and her sister has to answer for her .Her  affect is constricted.  Her attention and concentration is poor.  She has poverty of thought content.  She doesn't auditory or visual hallucination.  There were no paranoia or delusion obsession present at this time.  Her fund of knowledge is okay.  There are tremors and shakes in her hand.  She's alert and oriented x3.  Her insight judgment and impulse control is okay.  Lab Results:  Results for orders placed during the hospital encounter of 07/22/12 (from the past 8736 hour(s))  CBC WITH DIFFERENTIAL   Collection Time    07/22/12  4:56 PM      Result Value Range   WBC 8.0  4.0 - 10.5 K/uL   RBC 4.01  3.87 - 5.11 MIL/uL   Hemoglobin 12.3  12.0 - 15.0 g/dL   HCT 40.9  81.1 - 91.4 %   MCV 95.3  78.0 - 100.0 fL   MCH 30.7  26.0 - 34.0 pg   MCHC 32.2  30.0 - 36.0 g/dL   RDW 78.2  95.6 - 21.3 %   Platelets 189  150 - 400 K/uL   Neutrophils Relative % 82 (*) 43 - 77 %   Neutro Abs 6.5  1.7 - 7.7 K/uL   Lymphocytes Relative 10 (*) 12 - 46 %   Lymphs Abs 0.8  0.7 - 4.0 K/uL   Monocytes Relative 7  3 - 12 %   Monocytes Absolute 0.5  0.1 - 1.0 K/uL   Eosinophils Relative 1  0 - 5 %   Eosinophils Absolute 0.1  0.0 - 0.7 K/uL   Basophils Relative 0  0 - 1 %   Basophils Absolute 0.0  0.0 - 0.1 K/uL  COMPREHENSIVE METABOLIC PANEL   Collection Time    07/22/12  4:56 PM      Result Value Range   Sodium 138  135 - 145 mEq/L   Potassium 4.4  3.5 - 5.1 mEq/L   Chloride 102  96 - 112 mEq/L   CO2 28  19 - 32 mEq/L   Glucose, Bld 108 (*) 70 - 99 mg/dL   BUN 18  6 - 23 mg/dL   Creatinine, Ser 0.86 (*) 0.50 - 1.10 mg/dL   Calcium 57.8  8.4 - 46.9 mg/dL   Total Protein 6.5  6.0 - 8.3 g/dL   Albumin 3.2 (*) 3.5 - 5.2 g/dL   AST 13  0 - 37 U/L   ALT 7  0 - 35 U/L   Alkaline Phosphatase 96  39 - 117 U/L   Total Bilirubin 0.2 (*) 0.3 - 1.2 mg/dL   GFR calc non Af Amer 37 (*) >90 mL/min   GFR calc Af Amer 43 (*) >90 mL/min  URINE CULTURE   Collection Time    07/22/12  5:51 PM      Result Value  Range   Specimen Description URINE, CATHETERIZED     Special Requests NONE     Culture  Setup Time 07/22/2012 18:35     Colony Count NO GROWTH     Culture NO GROWTH     Report Status 07/23/2012 FINAL    URINALYSIS, ROUTINE W REFLEX MICROSCOPIC  Collection Time    07/22/12  5:51 PM      Result Value Range   Color, Urine YELLOW  YELLOW   APPearance CLEAR  CLEAR   Specific Gravity, Urine 1.010  1.005 - 1.030   pH 6.0  5.0 - 8.0   Glucose, UA NEGATIVE  NEGATIVE mg/dL   Hgb urine dipstick NEGATIVE  NEGATIVE   Bilirubin Urine NEGATIVE  NEGATIVE   Ketones, ur NEGATIVE  NEGATIVE mg/dL   Protein, ur NEGATIVE  NEGATIVE mg/dL   Urobilinogen, UA 0.2  0.0 - 1.0 mg/dL   Nitrite NEGATIVE  NEGATIVE   Leukocytes, UA SMALL (*) NEGATIVE  URINE MICROSCOPIC-ADD ON   Collection Time    07/22/12  5:51 PM      Result Value Range   WBC, UA 3-6  <3 WBC/hpf   RBC / HPF 0-2  <3 RBC/hpf   Bacteria, UA MANY (*) RARE  Results for orders placed in visit on 07/02/12 (from the past 8736 hour(s))  COMPREHENSIVE METABOLIC PANEL   Collection Time    07/02/12  1:45 PM      Result Value Range   Sodium 138  135 - 145 mEq/L   Potassium 4.6  3.5 - 5.3 mEq/L   Chloride 104  96 - 112 mEq/L   CO2 28  19 - 32 mEq/L   Glucose, Bld 138 (*) 70 - 99 mg/dL   BUN 27 (*) 6 - 23 mg/dL   Creat 1.61 (*) 0.96 - 1.10 mg/dL   Total Bilirubin 0.4  0.3 - 1.2 mg/dL   Alkaline Phosphatase 86  39 - 117 U/L   AST 17  0 - 37 U/L   ALT <8  0 - 35 U/L   Total Protein 6.3  6.0 - 8.3 g/dL   Albumin 3.7  3.5 - 5.2 g/dL   Calcium 9.7  8.4 - 04.5 mg/dL  CBC WITH DIFFERENTIAL   Collection Time    07/02/12  1:45 PM      Result Value Range   WBC 7.3  4.0 - 10.5 K/uL   RBC 3.87  3.87 - 5.11 MIL/uL   Hemoglobin 11.5 (*) 12.0 - 15.0 g/dL   HCT 40.9 (*) 81.1 - 91.4 %   MCV 92.2  78.0 - 100.0 fL   MCH 29.7  26.0 - 34.0 pg   MCHC 32.2  30.0 - 36.0 g/dL   RDW 78.2  95.6 - 21.3 %   Platelets 250  150 - 400 K/uL   Neutrophils Relative %  81 (*) 43 - 77 %   Neutro Abs 5.9  1.7 - 7.7 K/uL   Lymphocytes Relative 11 (*) 12 - 46 %   Lymphs Abs 0.8  0.7 - 4.0 K/uL   Monocytes Relative 7  3 - 12 %   Monocytes Absolute 0.5  0.1 - 1.0 K/uL   Eosinophils Relative 1  0 - 5 %   Eosinophils Absolute 0.1  0.0 - 0.7 K/uL   Basophils Relative 0  0 - 1 %   Basophils Absolute 0.0  0.0 - 0.1 K/uL   Smear Review Criteria for review not met    HEMOGLOBIN A1C   Collection Time    07/02/12  1:45 PM      Result Value Range   Hemoglobin A1C 5.5  <5.7 %   Mean Plasma Glucose 111  <117 mg/dL  Results for orders placed during the hospital encounter of 02/12/12 (from the past 8736 hour(s))  CBC WITH DIFFERENTIAL  Collection Time    02/12/12  5:45 PM      Result Value Range   WBC 9.2  4.0 - 10.5 K/uL   RBC 3.63 (*) 3.87 - 5.11 MIL/uL   Hemoglobin 11.0 (*) 12.0 - 15.0 g/dL   HCT 16.1 (*) 09.6 - 04.5 %   MCV 95.6  78.0 - 100.0 fL   MCH 30.3  26.0 - 34.0 pg   MCHC 31.7  30.0 - 36.0 g/dL   RDW 40.9  81.1 - 91.4 %   Platelets 162  150 - 400 K/uL   Neutrophils Relative % 88 (*) 43 - 77 %   Neutro Abs 8.2 (*) 1.7 - 7.7 K/uL   Lymphocytes Relative 6 (*) 12 - 46 %   Lymphs Abs 0.5 (*) 0.7 - 4.0 K/uL   Monocytes Relative 5  3 - 12 %   Monocytes Absolute 0.5  0.1 - 1.0 K/uL   Eosinophils Relative 1  0 - 5 %   Eosinophils Absolute 0.1  0.0 - 0.7 K/uL   Basophils Relative 0  0 - 1 %   Basophils Absolute 0.0  0.0 - 0.1 K/uL  COMPREHENSIVE METABOLIC PANEL   Collection Time    02/12/12  5:45 PM      Result Value Range   Sodium 140  135 - 145 mEq/L   Potassium 4.8  3.5 - 5.1 mEq/L   Chloride 108  96 - 112 mEq/L   CO2 25  19 - 32 mEq/L   Glucose, Bld 99  70 - 99 mg/dL   BUN 20  6 - 23 mg/dL   Creatinine, Ser 7.82 (*) 0.50 - 1.10 mg/dL   Calcium 9.6  8.4 - 95.6 mg/dL   Total Protein 6.2  6.0 - 8.3 g/dL   Albumin 3.0 (*) 3.5 - 5.2 g/dL   AST 14  0 - 37 U/L   ALT 9  0 - 35 U/L   Alkaline Phosphatase 89  39 - 117 U/L   Total Bilirubin 0.2 (*)  0.3 - 1.2 mg/dL   GFR calc non Af Amer 42 (*) >90 mL/min   GFR calc Af Amer 48 (*) >90 mL/min  PROTIME-INR   Collection Time    02/12/12  5:45 PM      Result Value Range   Prothrombin Time 14.3  11.6 - 15.2 seconds   INR 1.13  0.00 - 1.49  APTT   Collection Time    02/12/12  5:45 PM      Result Value Range   aPTT 29  24 - 37 seconds  POCT I-STAT TROPONIN I   Collection Time    02/12/12  6:05 PM      Result Value Range   Troponin i, poc 0.00  0.00 - 0.08 ng/mL   Comment 3           URINE CULTURE   Collection Time    02/12/12  6:09 PM      Result Value Range   Specimen Description URINE, CATHETERIZED     Special Requests ADDED [id number]     Culture  Setup Time 02/12/2012 21:15     Colony Count NO GROWTH     Culture NO GROWTH     Report Status 02/13/2012 FINAL    URINALYSIS, ROUTINE W REFLEX MICROSCOPIC   Collection Time    02/12/12  6:09 PM      Result Value Range   Color, Urine YELLOW  YELLOW   APPearance CLEAR  CLEAR   Specific Gravity, Urine 1.011  1.005 - 1.030   pH 6.5  5.0 - 8.0   Glucose, UA NEGATIVE  NEGATIVE mg/dL   Hgb urine dipstick NEGATIVE  NEGATIVE   Bilirubin Urine NEGATIVE  NEGATIVE   Ketones, ur NEGATIVE  NEGATIVE mg/dL   Protein, ur NEGATIVE  NEGATIVE mg/dL   Urobilinogen, UA 1.0  0.0 - 1.0 mg/dL   Nitrite NEGATIVE  NEGATIVE   Leukocytes, UA MODERATE (*) NEGATIVE  URINE MICROSCOPIC-ADD ON   Collection Time    02/12/12  6:09 PM      Result Value Range   Squamous Epithelial / LPF FEW (*) RARE   WBC, UA 11-20  <3 WBC/hpf   RBC / HPF 0-2  <3 RBC/hpf   Bacteria, UA MANY (*) RARE  CBC   Collection Time    02/13/12  5:35 AM      Result Value Range   WBC 9.3  4.0 - 10.5 K/uL   RBC 3.54 (*) 3.87 - 5.11 MIL/uL   Hemoglobin 10.7 (*) 12.0 - 15.0 g/dL   HCT 40.9 (*) 81.1 - 91.4 %   MCV 96.0  78.0 - 100.0 fL   MCH 30.2  26.0 - 34.0 pg   MCHC 31.5  30.0 - 36.0 g/dL   RDW 78.2  95.6 - 21.3 %   Platelets 181  150 - 400 K/uL  BASIC METABOLIC PANEL    Collection Time    02/13/12  5:35 AM      Result Value Range   Sodium 142  135 - 145 mEq/L   Potassium 4.5  3.5 - 5.1 mEq/L   Chloride 110  96 - 112 mEq/L   CO2 26  19 - 32 mEq/L   Glucose, Bld 104 (*) 70 - 99 mg/dL   BUN 22  6 - 23 mg/dL   Creatinine, Ser 0.86 (*) 0.50 - 1.10 mg/dL   Calcium 9.3  8.4 - 57.8 mg/dL   GFR calc non Af Amer 40 (*) >90 mL/min   GFR calc Af Amer 46 (*) >90 mL/min  BASIC METABOLIC PANEL   Collection Time    02/14/12  4:30 AM      Result Value Range   Sodium 143  135 - 145 mEq/L   Potassium 3.9  3.5 - 5.1 mEq/L   Chloride 109  96 - 112 mEq/L   CO2 26  19 - 32 mEq/L   Glucose, Bld 98  70 - 99 mg/dL   BUN 18  6 - 23 mg/dL   Creatinine, Ser 4.69 (*) 0.50 - 1.10 mg/dL   Calcium 9.9  8.4 - 62.9 mg/dL   GFR calc non Af Amer 39 (*) >90 mL/min   GFR calc Af Amer 45 (*) >90 mL/min  CBC   Collection Time    02/14/12  4:30 AM      Result Value Range   WBC 10.4  4.0 - 10.5 K/uL   RBC 3.70 (*) 3.87 - 5.11 MIL/uL   Hemoglobin 11.1 (*) 12.0 - 15.0 g/dL   HCT 52.8 (*) 41.3 - 24.4 %   MCV 94.6  78.0 - 100.0 fL   MCH 30.0  26.0 - 34.0 pg   MCHC 31.7  30.0 - 36.0 g/dL   RDW 01.0  27.2 - 53.6 %   Platelets 171  150 - 400 K/uL   she had labs done last week with her primary care physician we will get the result Diagnoses 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 She still very depressed on a high dose of Paxil. I recommended decreasing it to 40 mg at bedtime only and starting Wellbutrin 75 mg every morning. She'll keep her other medications the same for now and return to see me in four-week's Recommend if she does not see any improvement in call us back immediately.  Discussed the risk and benefits of the medication in  detail.    Time spent 25 minutes.  More than 50% of the time spent and psychoeducation, counseling and coordination of care.  MEDICATIONS this encounter: Meds ordered this encounter  Medications  . PARoxetine (PAXIL) 40 MG tablet    Sig: Take 1 tablet (40 mg total) by mouth every morning.    Dispense:  30 tablet    Refill:  1  . risperiDONE (RISPERDAL) 0.25 MG tablet    Sig: Take 1 in am with breakfast and than may need 2nd in afternoon.    Dispense:  60 tablet    Refill:  2  . risperiDONE (RISPERDAL) 1 MG tablet    Sig: Take 1 tablet (1 mg total) by mouth at bedtime.    Dispense:  30 tablet    Refill:  2  . benztropine (COGENTIN) 0.5 MG tablet    Sig: Take 1 tablet (0.5 mg total) by mouth 2 (two) times daily.    Dispense:  60 tablet    Refill:  1  . buPROPion (WELLBUTRIN) 75 MG tablet    Sig: Take 1 tablet (75 mg total) by mouth every morning.    Dispense:  30 tablet    Refill:  2    Medical Decision Making Problem Points:  Established problem, stable/improving (1), Established problem, worsening (2), New problem, with additional work-up planned (4), Review of last therapy session (1) and Review of psycho-social stressors (1) Data Points:  Review of medication regiment & side effects (2) Review of new medications or change in dosage (2)  I certify that outpatient services furnished can reasonably be expected to improve the patient's condition.   Diannia Ruder, MD

## 2013-01-25 ENCOUNTER — Ambulatory Visit (HOSPITAL_COMMUNITY): Payer: Self-pay | Admitting: Psychiatry

## 2013-01-29 ENCOUNTER — Encounter: Payer: Self-pay | Admitting: *Deleted

## 2013-01-29 DIAGNOSIS — L600 Ingrowing nail: Secondary | ICD-10-CM

## 2013-02-09 ENCOUNTER — Telehealth (HOSPITAL_COMMUNITY): Payer: Self-pay | Admitting: Psychiatry

## 2013-02-09 ENCOUNTER — Encounter (HOSPITAL_COMMUNITY): Payer: Self-pay | Admitting: Emergency Medicine

## 2013-02-09 ENCOUNTER — Ambulatory Visit (HOSPITAL_COMMUNITY): Payer: Self-pay | Admitting: Psychiatry

## 2013-02-09 ENCOUNTER — Emergency Department (HOSPITAL_COMMUNITY): Payer: Medicare Other

## 2013-02-09 ENCOUNTER — Inpatient Hospital Stay (HOSPITAL_COMMUNITY)
Admission: EM | Admit: 2013-02-09 | Discharge: 2013-02-11 | DRG: 683 | Disposition: A | Discharge status: Home Health | Payer: Medicare Other | Attending: Family Medicine | Admitting: Family Medicine

## 2013-02-09 DIAGNOSIS — J45909 Unspecified asthma, uncomplicated: Secondary | ICD-10-CM | POA: Diagnosis present

## 2013-02-09 DIAGNOSIS — M129 Arthropathy, unspecified: Secondary | ICD-10-CM | POA: Diagnosis present

## 2013-02-09 DIAGNOSIS — W19XXXA Unspecified fall, initial encounter: Secondary | ICD-10-CM | POA: Diagnosis not present

## 2013-02-09 DIAGNOSIS — R5381 Other malaise: Secondary | ICD-10-CM | POA: Diagnosis not present

## 2013-02-09 DIAGNOSIS — R030 Elevated blood-pressure reading, without diagnosis of hypertension: Secondary | ICD-10-CM | POA: Diagnosis present

## 2013-02-09 DIAGNOSIS — D649 Anemia, unspecified: Secondary | ICD-10-CM | POA: Diagnosis present

## 2013-02-09 DIAGNOSIS — M549 Dorsalgia, unspecified: Secondary | ICD-10-CM

## 2013-02-09 DIAGNOSIS — M75101 Unspecified rotator cuff tear or rupture of right shoulder, not specified as traumatic: Secondary | ICD-10-CM

## 2013-02-09 DIAGNOSIS — N390 Urinary tract infection, site not specified: Secondary | ICD-10-CM | POA: Diagnosis not present

## 2013-02-09 DIAGNOSIS — M25569 Pain in unspecified knee: Secondary | ICD-10-CM | POA: Diagnosis present

## 2013-02-09 DIAGNOSIS — K429 Umbilical hernia without obstruction or gangrene: Secondary | ICD-10-CM

## 2013-02-09 DIAGNOSIS — Z9181 History of falling: Secondary | ICD-10-CM

## 2013-02-09 DIAGNOSIS — R55 Syncope and collapse: Secondary | ICD-10-CM

## 2013-02-09 DIAGNOSIS — E785 Hyperlipidemia, unspecified: Secondary | ICD-10-CM | POA: Diagnosis present

## 2013-02-09 DIAGNOSIS — E860 Dehydration: Secondary | ICD-10-CM | POA: Diagnosis not present

## 2013-02-09 DIAGNOSIS — Z79899 Other long term (current) drug therapy: Secondary | ICD-10-CM

## 2013-02-09 DIAGNOSIS — R450 Nervousness: Secondary | ICD-10-CM | POA: Diagnosis not present

## 2013-02-09 DIAGNOSIS — M546 Pain in thoracic spine: Secondary | ICD-10-CM | POA: Diagnosis not present

## 2013-02-09 DIAGNOSIS — N189 Chronic kidney disease, unspecified: Secondary | ICD-10-CM | POA: Diagnosis present

## 2013-02-09 DIAGNOSIS — F329 Major depressive disorder, single episode, unspecified: Secondary | ICD-10-CM | POA: Diagnosis present

## 2013-02-09 DIAGNOSIS — L02419 Cutaneous abscess of limb, unspecified: Secondary | ICD-10-CM

## 2013-02-09 DIAGNOSIS — IMO0002 Reserved for concepts with insufficient information to code with codable children: Secondary | ICD-10-CM | POA: Diagnosis not present

## 2013-02-09 DIAGNOSIS — M1990 Unspecified osteoarthritis, unspecified site: Secondary | ICD-10-CM

## 2013-02-09 DIAGNOSIS — S8990XA Unspecified injury of unspecified lower leg, initial encounter: Secondary | ICD-10-CM | POA: Diagnosis not present

## 2013-02-09 DIAGNOSIS — R259 Unspecified abnormal involuntary movements: Secondary | ICD-10-CM | POA: Diagnosis not present

## 2013-02-09 DIAGNOSIS — F5105 Insomnia due to other mental disorder: Secondary | ICD-10-CM

## 2013-02-09 DIAGNOSIS — W19XXXS Unspecified fall, sequela: Secondary | ICD-10-CM | POA: Diagnosis not present

## 2013-02-09 DIAGNOSIS — Z90710 Acquired absence of both cervix and uterus: Secondary | ICD-10-CM

## 2013-02-09 DIAGNOSIS — F411 Generalized anxiety disorder: Secondary | ICD-10-CM | POA: Diagnosis present

## 2013-02-09 DIAGNOSIS — I6789 Other cerebrovascular disease: Secondary | ICD-10-CM | POA: Diagnosis not present

## 2013-02-09 DIAGNOSIS — L600 Ingrowing nail: Secondary | ICD-10-CM

## 2013-02-09 DIAGNOSIS — N179 Acute kidney failure, unspecified: Secondary | ICD-10-CM | POA: Diagnosis not present

## 2013-02-09 DIAGNOSIS — Z8679 Personal history of other diseases of the circulatory system: Secondary | ICD-10-CM

## 2013-02-09 LAB — CBC WITH DIFFERENTIAL/PLATELET
Basophils Relative: 0 % (ref 0–1)
Eosinophils Absolute: 0.1 10*3/uL (ref 0.0–0.7)
Eosinophils Relative: 1 % (ref 0–5)
Hemoglobin: 11.2 g/dL — ABNORMAL LOW (ref 12.0–15.0)
MCH: 28.9 pg (ref 26.0–34.0)
MCHC: 30.9 g/dL (ref 30.0–36.0)
Monocytes Relative: 9 % (ref 3–12)
Neutrophils Relative %: 72 % (ref 43–77)
Platelets: 211 10*3/uL (ref 150–400)

## 2013-02-09 LAB — BASIC METABOLIC PANEL
CO2: 28 mEq/L (ref 19–32)
Chloride: 101 mEq/L (ref 96–112)
Creatinine, Ser: 1.67 mg/dL — ABNORMAL HIGH (ref 0.50–1.10)
GFR calc Af Amer: 34 mL/min — ABNORMAL LOW (ref >90)
GFR calc non Af Amer: 29 mL/min — ABNORMAL LOW (ref >90)
Glucose, Bld: 95 mg/dL (ref 70–99)
Potassium: 4.8 mEq/L (ref 3.5–5.1)
Sodium: 137 mEq/L (ref 135–145)

## 2013-02-09 LAB — URINE MICROSCOPIC-ADD ON

## 2013-02-09 LAB — URINALYSIS, ROUTINE W REFLEX MICROSCOPIC
Bilirubin Urine: NEGATIVE
Glucose, UA: NEGATIVE mg/dL
Nitrite: NEGATIVE
Specific Gravity, Urine: 1.01 (ref 1.005–1.030)
Urobilinogen, UA: 0.2 mg/dL (ref 0.0–1.0)

## 2013-02-09 LAB — URIC ACID: Uric Acid, Serum: 5.7 mg/dL (ref 2.4–7.0)

## 2013-02-09 MED ORDER — BENZTROPINE MESYLATE 1 MG PO TABS
0.5000 mg | ORAL_TABLET | Freq: Two times a day (BID) | ORAL | Status: DC
  Administered 2013-02-09 – 2013-02-11 (×4): 0.5 mg via ORAL
  Filled 2013-02-09 (×4): qty 1

## 2013-02-09 MED ORDER — LAMOTRIGINE 25 MG PO TABS
ORAL_TABLET | ORAL | Status: AC
  Filled 2013-02-09: qty 4

## 2013-02-09 MED ORDER — INFLUENZA VAC SPLIT QUAD 0.5 ML IM SUSP
0.5000 mL | INTRAMUSCULAR | Status: AC
  Administered 2013-02-10: 0.5 mL via INTRAMUSCULAR
  Filled 2013-02-09: qty 0.5

## 2013-02-09 MED ORDER — LOSARTAN POTASSIUM 50 MG PO TABS
100.0000 mg | ORAL_TABLET | Freq: Every morning | ORAL | Status: DC
  Administered 2013-02-10 – 2013-02-11 (×2): 100 mg via ORAL
  Filled 2013-02-09 (×2): qty 2

## 2013-02-09 MED ORDER — TERAZOSIN HCL 1 MG PO CAPS
2.0000 mg | ORAL_CAPSULE | Freq: Every day | ORAL | Status: DC
  Administered 2013-02-09 – 2013-02-10 (×2): 2 mg via ORAL
  Filled 2013-02-09 (×2): qty 2

## 2013-02-09 MED ORDER — SODIUM CHLORIDE 0.9 % IV SOLN
INTRAVENOUS | Status: DC
  Administered 2013-02-09 – 2013-02-11 (×3): via INTRAVENOUS

## 2013-02-09 MED ORDER — ASPIRIN EC 81 MG PO TBEC
81.0000 mg | DELAYED_RELEASE_TABLET | Freq: Every day | ORAL | Status: DC
  Administered 2013-02-10 – 2013-02-11 (×2): 81 mg via ORAL
  Filled 2013-02-09 (×2): qty 1

## 2013-02-09 MED ORDER — ACETAMINOPHEN 500 MG PO TABS: 1000.0000 mg | ORAL_TABLET | Freq: Four times a day (QID) | ORAL | Status: DC | PRN

## 2013-02-09 MED ORDER — LAMOTRIGINE 100 MG PO TABS
100.0000 mg | ORAL_TABLET | Freq: Two times a day (BID) | ORAL | Status: DC
  Administered 2013-02-09 – 2013-02-11 (×4): 100 mg via ORAL
  Filled 2013-02-09 (×6): qty 1

## 2013-02-09 MED ORDER — RISPERIDONE 1 MG PO TABS
1.0000 mg | ORAL_TABLET | Freq: Every day | ORAL | Status: DC
  Administered 2013-02-09 – 2013-02-10 (×2): 1 mg via ORAL
  Filled 2013-02-09 (×3): qty 1

## 2013-02-09 MED ORDER — RISPERIDONE 1 MG PO TABS
1.0000 mg | ORAL_TABLET | Freq: Every day | ORAL | Status: DC
  Administered 2013-02-10 – 2013-02-11 (×2): 1 mg via ORAL
  Filled 2013-02-09: qty 1

## 2013-02-09 MED ORDER — HYDROCODONE-ACETAMINOPHEN 5-325 MG PO TABS
1.0000 | ORAL_TABLET | ORAL | Status: DC | PRN
  Administered 2013-02-10 – 2013-02-11 (×2): 1 via ORAL
  Filled 2013-02-09: qty 2
  Filled 2013-02-09 (×2): qty 1

## 2013-02-09 MED ORDER — DOCUSATE SODIUM 100 MG PO CAPS
200.0000 mg | ORAL_CAPSULE | Freq: Every day | ORAL | Status: DC
  Administered 2013-02-09 – 2013-02-10 (×2): 200 mg via ORAL
  Filled 2013-02-09 (×3): qty 2

## 2013-02-09 MED ORDER — HYDROCODONE-ACETAMINOPHEN 5-325 MG PO TABS
1.0000 | ORAL_TABLET | Freq: Once | ORAL | Status: AC
  Administered 2013-02-09: 1 via ORAL
  Filled 2013-02-09: qty 1

## 2013-02-09 MED ORDER — DARIFENACIN HYDROBROMIDE ER 7.5 MG PO TB24
15.0000 mg | ORAL_TABLET | Freq: Every day | ORAL | Status: DC
  Administered 2013-02-10 – 2013-02-11 (×2): 15 mg via ORAL
  Filled 2013-02-09: qty 1
  Filled 2013-02-09 (×2): qty 2

## 2013-02-09 MED ORDER — PAROXETINE HCL 20 MG PO TABS
40.0000 mg | ORAL_TABLET | Freq: Every day | ORAL | Status: DC
  Administered 2013-02-10 – 2013-02-11 (×2): 40 mg via ORAL
  Filled 2013-02-09 (×2): qty 2

## 2013-02-09 MED ORDER — ENOXAPARIN SODIUM 30 MG/0.3ML ~~LOC~~ SOLN
30.0000 mg | Freq: Every day | SUBCUTANEOUS | Status: DC
  Administered 2013-02-09: 30 mg via SUBCUTANEOUS
  Filled 2013-02-09: qty 0.3

## 2013-02-09 MED ORDER — BUPROPION HCL 75 MG PO TABS
75.0000 mg | ORAL_TABLET | Freq: Every morning | ORAL | Status: DC
  Administered 2013-02-10 – 2013-02-11 (×2): 75 mg via ORAL
  Filled 2013-02-09 (×3): qty 1

## 2013-02-09 NOTE — H&P (Addendum)
PCP:   Colette Ribas, MD   Chief Complaint:  Fall  HPI: 69 old female who was brought to the ED with difficulty walking. As per patient's sister patient fell 3 days ago at that time she was seen by EMS. She hit her back on the edge of the recliner, but was able to get up with help of EMS and was doing fine. Patient was able walk early this morning but after sitting in the chair patient did not want to get up due to fear of fall. She says that she is unstable on walking. Patient uses walker at home. She denies dysuria urgency or frequency of urination. Denies chest pain shortness of breath no nausea vomiting or diarrhea. In the ED x-rays of the thoracic spine as well as right knee performed which are negative for any fractures. Lab work reveals dehydration and abnormal UA. Patient denies any pain at this time.  Allergies:   Allergies  Allergen Reactions  . Benzodiazepines Other (See Comments)    Memory problems on Ativan, which goes away when she is off of it.   . Ciprofloxacin     Rash at site of injection with itching  . Cephalexin Rash and Other (See Comments)    Blistering of the mouth  . Penicillins Rash      Past Medical History  Diagnosis Date  . High blood pressure   . Arthritis   . Depression   . Anxiety   . Hemorrhoids   . Hyperlipidemia   . Bipolar 1 disorder     Past Surgical History  Procedure Laterality Date  . Back surgery  03/16/87  . Cholecystectomy  08/15/93  . Partial hysterectomy  04/04/95  . Bladder tacked  04/04/95  . Ankle surgery  04/28/97    right ankle fracture  . Wrist surgery  02/24/09    right wrist fracture  . Colonoscopy  10/26/2002    Dr. Karilyn Cota- small external hemorrhoids otherwise normal   . Breast biopsy  05/16/95    right side  . Savory dilation  10/31/2011    Procedure: SAVORY DILATION;  Surgeon: Corbin Ade, MD;  Location: [facility];  Service: Endoscopy;  Laterality: N/A;  Elease Hashimoto dilation  10/31/2011    Procedure: Elease Hashimoto  DILATION;  Surgeon: Corbin Ade, MD;  Location: [facility];  Service: Endoscopy;  Laterality: N/A;  . Abdominal hysterectomy      Prior to Admission medications   Medication Sig Start Date End Date Taking? Authorizing Provider  acetaminophen (TYLENOL) 500 MG tablet Take 1,000 mg by mouth every 6 (six) hours as needed for pain.   Yes Historical Provider, MD  aspirin EC 81 MG tablet Take 81 mg by mouth every morning.    Yes Historical Provider, MD  benztropine (COGENTIN) 0.5 MG tablet Take 1 tablet (0.5 mg total) by mouth 2 (two) times daily. 01/12/13 01/12/14 Yes Diannia Ruder, MD  beta carotene w/minerals (OCUVITE) tablet Take 1 tablet by mouth every morning.    Yes Historical Provider, MD  Biotin 5000 MCG CAPS Take 1 capsule by mouth every morning.   Yes Historical Provider, MD  buPROPion (WELLBUTRIN) 75 MG tablet Take 1 tablet (75 mg total) by mouth every morning. 01/12/13 01/12/14 Yes Diannia Ruder, MD  calcium carbonate (OS-CAL) 600 MG TABS Take 600 mg by mouth 2 (two) times daily with a meal.    Yes Historical Provider, MD  Cholecalciferol (VITAMIN D) 2000 UNITS CAPS Take 1 capsule by mouth  every morning.    Yes Historical Provider, MD  docusate sodium (STOOL SOFTENER) 100 MG capsule Take 200 mg by mouth at bedtime.   Yes Historical Provider, MD  lamoTRIgine (LAMICTAL) 100 MG tablet Take 1 tablet (100 mg total) by mouth 2 (two) times daily. 12/22/12  Yes Cleotis Nipper, MD  losartan (COZAAR) 100 MG tablet Take 100 mg by mouth every morning.  10/04/11  Yes Historical Provider, MD  meloxicam (MOBIC) 15 MG tablet Take 15 mg by mouth daily. For pain   *Sometimes patient uses Tylenol Arthritis* 09/28/10  Yes Historical Provider, MD  omeprazole (PRILOSEC) 20 MG capsule Take 40 mg by mouth every morning.  09/19/10  Yes Historical Provider, MD  PARoxetine (PAXIL) 40 MG tablet Take 1 tablet (40 mg total) by mouth every morning. 01/12/13  Yes Diannia Ruder, MD  risperiDONE (RISPERDAL) 0.25 MG tablet Take 1 in am  with breakfast and than may need 2nd in afternoon. 01/12/13  Yes Diannia Ruder, MD  risperiDONE (RISPERDAL) 1 MG tablet Take 1 tablet (1 mg total) by mouth at bedtime. 01/12/13  Yes Diannia Ruder, MD  solifenacin (VESICARE) 10 MG tablet Take 10 mg by mouth every morning.    Yes Historical Provider, MD  terazosin (HYTRIN) 2 MG capsule Take 2 mg by mouth at bedtime.    Yes Historical Provider, MD  vitamin B-12 (CYANOCOBALAMIN) 1000 MCG tablet Take 1,000 mcg by mouth daily.   Yes Historical Provider, MD  vitamin E 400 UNIT capsule Take 1 capsule (400 Units total) by mouth 2 (two) times daily. 08/27/12 08/27/13 Yes Mike Craze, MD    Social History:  reports that she has never smoked. She has never used smokeless tobacco. She reports that she does not drink alcohol or use illicit drugs.  Family History  Problem Relation Age of Onset  . Arthritis    . Asthma    . Diabetes    . Colon cancer Neg Hx   . Anxiety disorder Paternal Aunt   . Anxiety disorder Paternal Uncle   . Bipolar disorder Cousin      All the positives are listed in BOLD  Review of Systems:  HEENT: Headache, blurred vision, runny nose, sore throat Neck: Hypothyroidism, hyperthyroidism,,lymphadenopathy Chest : Shortness of breath, history of COPD, Asthma Heart : Chest pain, history of coronary arterey disease GI:  Nausea, vomiting, diarrhea, constipation, GERD GU: Dysuria, urgency, frequency of urination, hematuria Neuro: Stroke, seizures, syncope Psych: Depression, anxiety, hallucinations   Physical Exam: Blood pressure 129/71, pulse 92, temperature 98.7 F (37.1 C), temperature source Oral, resp. rate 20, height 5\' 6"  (1.676 m), weight 89.359 kg (197 lb), SpO2 100.00%. Constitutional:   Patient is a well-developed and well-nourished *female in no acute distress and cooperative with exam. Head: Normocephalic and atraumatic Mouth: Mucus membranes moist Eyes: PERRL, EOMI, conjunctivae normal Neck: Supple, No  Thyromegaly Cardiovascular: RRR, S1 normal, S2 normal Pulmonary/Chest: CTAB, no wheezes, rales, or rhonchi Abdominal: Soft. Non-tender, non-distended, bowel sounds are normal, no masses, organomegaly, or guarding present.  Neurological: A&O x3, Strenght is normal and symmetric bilaterally, cranial nerve II-XII are grossly intact, no focal motor deficit, sensory intact to light touch bilaterally.  Extremities : No Cyanosis, Clubbing or Edema Right knee: Warm to touch, no erythema or swelling noted   Labs on Admission:  Results for orders placed during the hospital encounter of 02/09/13 (from the past 48 hour(s))  CBC WITH DIFFERENTIAL     Status: Abnormal   Collection Time  02/09/13  7:41 PM      Result Value Range   WBC 5.7  4.0 - 10.5 K/uL   RBC 3.87  3.87 - 5.11 MIL/uL   Hemoglobin 11.2 (*) 12.0 - 15.0 g/dL   HCT 16.1  09.6 - 04.5 %   MCV 93.8  78.0 - 100.0 fL   MCH 28.9  26.0 - 34.0 pg   MCHC 30.9  30.0 - 36.0 g/dL   RDW 40.9  81.1 - 91.4 %   Platelets 211  150 - 400 K/uL   Neutrophils Relative % 72  43 - 77 %   Neutro Abs 4.1  1.7 - 7.7 K/uL   Lymphocytes Relative 18  12 - 46 %   Lymphs Abs 1.0  0.7 - 4.0 K/uL   Monocytes Relative 9  3 - 12 %   Monocytes Absolute 0.5  0.1 - 1.0 K/uL   Eosinophils Relative 1  0 - 5 %   Eosinophils Absolute 0.1  0.0 - 0.7 K/uL   Basophils Relative 0  0 - 1 %   Basophils Absolute 0.0  0.0 - 0.1 K/uL  BASIC METABOLIC PANEL     Status: Abnormal   Collection Time    02/09/13  7:41 PM      Result Value Range   Sodium 137  135 - 145 mEq/L   Potassium 4.8  3.5 - 5.1 mEq/L   Chloride 101  96 - 112 mEq/L   CO2 28  19 - 32 mEq/L   Glucose, Bld 95  70 - 99 mg/dL   BUN 28 (*) 6 - 23 mg/dL   Creatinine, Ser 7.82 (*) 0.50 - 1.10 mg/dL   Calcium 95.6  8.4 - 21.3 mg/dL   GFR calc non Af Amer 29 (*) >90 mL/min   GFR calc Af Amer 34 (*) >90 mL/min   Comment: (NOTE)     The eGFR has been calculated using the CKD EPI equation.     This calculation  has not been validated in all clinical situations.     eGFR's persistently <90 mL/min signify possible Chronic Kidney     Disease.  URINALYSIS, ROUTINE W REFLEX MICROSCOPIC     Status: Abnormal   Collection Time    02/09/13  9:11 PM      Result Value Range   Color, Urine YELLOW  YELLOW   APPearance CLEAR  CLEAR   Specific Gravity, Urine 1.010  1.005 - 1.030   pH 6.0  5.0 - 8.0   Glucose, UA NEGATIVE  NEGATIVE mg/dL   Hgb urine dipstick NEGATIVE  NEGATIVE   Bilirubin Urine NEGATIVE  NEGATIVE   Ketones, ur NEGATIVE  NEGATIVE mg/dL   Protein, ur NEGATIVE  NEGATIVE mg/dL   Urobilinogen, UA 0.2  0.0 - 1.0 mg/dL   Nitrite NEGATIVE  NEGATIVE   Leukocytes, UA SMALL (*) NEGATIVE  URINE MICROSCOPIC-ADD ON     Status: Abnormal   Collection Time    02/09/13  9:11 PM      Result Value Range   Squamous Epithelial / LPF MANY (*) RARE   WBC, UA 7-10  <3 WBC/hpf   RBC / HPF 0-2  <3 RBC/hpf   Bacteria, UA MANY (*) RARE   Casts HYALINE CASTS (*) NEGATIVE    Radiological Exams on Admission: Dg Thoracic Spine W/swimmers  02/09/2013   CLINICAL DATA:  Fall, upper back pain  EXAM: THORACIC SPINE - 2 VIEW + SWIMMERS  COMPARISON:  None.  FINDINGS: Normal thoracic  kyphosis.  No evidence of fracture or dislocation. Vertebral body heights are maintained.  Mild multilevel degenerative changes.  Visualized lungs are clear.  Cholecystectomy clips.  IMPRESSION: No fracture or dislocation is seen.   Electronically Signed   By: Charline Bills M.D.   On: 02/09/2013 20:30   Dg Knee Complete 4 Views Right  02/09/2013   CLINICAL DATA:  Fall, right knee pain  EXAM: RIGHT KNEE - COMPLETE 4+ VIEW  COMPARISON:  11/29/2012  FINDINGS: No fracture or dislocation is seen.  Mild tricompartmental degenerative changes, most prominent in the patellofemoral compartment. Mild lateral compartment chondrocalcinosis.  No suprapatellar knee joint effusion.  IMPRESSION: No fracture or dislocation is seen.   Electronically Signed   By:  Charline Bills M.D.   On: 02/09/2013 20:29    Assessment/Plan Active Problems:   HIGH BLOOD PRESSURE   Major depression   UTI (lower urinary tract infection)   Fall  Status post fall Patient does not have any abnormality on the imaging studies which include the thoracic spine as well as the right knee. Patient does have gait imbalance, will obtain physical therapy consult. Patient will benefit from PT at home  Right knee pain Right knee is warm to touch, exit of the knee is negative Will obtain uric acid levels Continue Vicodin for pain when necessary  Abnormal UA ? Contamination, repeat UA is normal   Maj. Depression Continue Wellbutrin, Risperdal, Lamictal, Paxil  DVT prophylaxis Lovenox  Code status: Full code  Family discussion: Discussed with patient's sister at bedside   Time Spent on Admission: 75 min  Damontay Alred S [facility] Pager: [phone] 02/09/2013, 10:48 PM  If 7PM-7AM, please contact night-coverage  [email or web address]  Password [email or web address]

## 2013-02-09 NOTE — ED Notes (Signed)
Patient brought in via EMS from home. Alert and oriented. Airway patent. Per EMS patient fell 3 days ago hitting back. Denies hitting head , LOC, or dizziness. Patient refused to come then. CNS was intact and patient ambulated then with EMS. Patient states now that she has bump on mid back and unsteady on feet. Per EMS patient ambulated with cane from chair to porch to get on stretcher.

## 2013-02-09 NOTE — ED Provider Notes (Signed)
CSN: [id number]     Arrival date & time 02/09/13  1849 History  This chart was scribed for Roney Marion, MD by Blanchard Kelch, ED Scribe. The patient was seen in room [facility]07/[facility]07. Patient's care was started at 7:21 PM.    Chief Complaint  Patient presents with  . Fall  . Back Pain    The history is provided by the patient and a relative. No language interpreter was used.    HPI Comments: QUANIYAH BUGH is a 72 y.o. female brought in by ambulance, who presents to the Emergency Department due to a fall that occurred three days ago while she was in her home. She hit her back on the edge of a recliner when she fell. She denies hitting her head or neck, or any pain to either areas. She complains of constant mid back pain that began suddenly after the fall. She reports the pain is currently a 5/10 in severity. She also complains of baseline right knee pain that began in July with a previous fall. Her sister states she has not been walking today because she was nervous about falling again. She reports taking one baby aspirin a day but denies taking any other anticoagulants.     She and her sister, whom she lives with, both state that she cannot walk at home despite the use of her walker and her lift chair.   Past Medical History  Diagnosis Date  . High blood pressure   . Arthritis   . Depression   . Anxiety   . Hemorrhoids   . Hyperlipidemia   . Bipolar 1 disorder    Past Surgical History  Procedure Laterality Date  . Back surgery  03/16/87  . Cholecystectomy  08/15/93  . Partial hysterectomy  04/04/95  . Bladder tacked  04/04/95  . Ankle surgery  04/28/97    right ankle fracture  . Wrist surgery  02/24/09    right wrist fracture  . Colonoscopy  10/26/2002    Dr. Karilyn Cota- small external hemorrhoids otherwise normal   . Breast biopsy  05/16/95    right side  . Savory dilation  10/31/2011    Procedure: SAVORY DILATION;  Surgeon: Corbin Ade, MD;  Location: [facility];  Service:  Endoscopy;  Laterality: N/A;  Elease Hashimoto dilation  10/31/2011    Procedure: Elease Hashimoto DILATION;  Surgeon: Corbin Ade, MD;  Location: [facility];  Service: Endoscopy;  Laterality: N/A;  . Abdominal hysterectomy     Family History  Problem Relation Age of Onset  . Arthritis    . Asthma    . Diabetes    . Colon cancer Neg Hx   . Anxiety disorder Paternal Aunt   . Anxiety disorder Paternal Uncle   . Bipolar disorder Cousin    History  Substance Use Topics  . Smoking status: Never Smoker   . Smokeless tobacco: Never Used  . Alcohol Use: No   OB History   Grav Para Term Preterm Abortions TAB SAB Ect Mult Living            0     Review of Systems  Constitutional: Negative for fever, chills, diaphoresis, appetite change and fatigue.  HENT: Negative for sore throat, mouth sores, trouble swallowing and neck pain.   Eyes: Negative for visual disturbance.  Respiratory: Negative for cough, chest tightness, shortness of breath and wheezing.   Cardiovascular: Negative for chest pain.  Gastrointestinal: Negative for nausea, vomiting, abdominal pain, diarrhea and  abdominal distention.  Endocrine: Negative for polydipsia, polyphagia and polyuria.  Genitourinary: Negative for dysuria, frequency and hematuria.  Musculoskeletal: Positive for back pain and arthralgias (right knee pain). Negative for gait problem.  Skin: Negative for color change, pallor and rash.  Neurological: Negative for dizziness, syncope, light-headedness and headaches.  Hematological: Does not bruise/bleed easily.  Psychiatric/Behavioral: Negative for behavioral problems and confusion.     Allergies  Benzodiazepines; Ciprofloxacin; Cephalexin; and Penicillins  Home Medications   Current Outpatient Rx  Name  Route  Sig  Dispense  Refill  . acetaminophen (TYLENOL) 500 MG tablet   Oral   Take 1,000 mg by mouth every 6 (six) hours as needed for pain.         Marland Kitchen aspirin EC 81 MG tablet   Oral   Take 81 mg by  mouth every morning.          . benztropine (COGENTIN) 0.5 MG tablet   Oral   Take 1 tablet (0.5 mg total) by mouth 2 (two) times daily.   60 tablet   1   . beta carotene w/minerals (OCUVITE) tablet   Oral   Take 1 tablet by mouth every morning.          . Biotin 5000 MCG CAPS   Oral   Take 1 capsule by mouth every morning.         Marland Kitchen buPROPion (WELLBUTRIN) 75 MG tablet   Oral   Take 1 tablet (75 mg total) by mouth every morning.   30 tablet   2   . calcium carbonate (OS-CAL) 600 MG TABS   Oral   Take 600 mg by mouth 2 (two) times daily with a meal.          . Cholecalciferol (VITAMIN D) 2000 UNITS CAPS   Oral   Take 1 capsule by mouth every morning.          . docusate sodium (STOOL SOFTENER) 100 MG capsule   Oral   Take 200 mg by mouth at bedtime.         . lamoTRIgine (LAMICTAL) 100 MG tablet   Oral   Take 1 tablet (100 mg total) by mouth 2 (two) times daily.   60 tablet   1     She is taking 100 mg twice a d ay. Don't give 25 m ...   . losartan (COZAAR) 100 MG tablet   Oral   Take 100 mg by mouth every morning.          . meloxicam (MOBIC) 15 MG tablet   Oral   Take 15 mg by mouth daily. For pain   *Sometimes patient uses Tylenol Arthritis*         . omeprazole (PRILOSEC) 20 MG capsule   Oral   Take 40 mg by mouth every morning.          Marland Kitchen PARoxetine (PAXIL) 40 MG tablet   Oral   Take 1 tablet (40 mg total) by mouth every morning.   30 tablet   1   . risperiDONE (RISPERDAL) 0.25 MG tablet      Take 1 in am with breakfast and than may need 2nd in afternoon.   60 tablet   2   . risperiDONE (RISPERDAL) 1 MG tablet   Oral   Take 1 tablet (1 mg total) by mouth at bedtime.   30 tablet   2   . solifenacin (VESICARE) 10 MG tablet   Oral  Take 10 mg by mouth every morning.          . terazosin (HYTRIN) 2 MG capsule   Oral   Take 2 mg by mouth at bedtime.          . vitamin B-12 (CYANOCOBALAMIN) 1000 MCG tablet   Oral    Take 1,000 mcg by mouth daily.         . vitamin E 400 UNIT capsule   Oral   Take 1 capsule (400 Units total) by mouth 2 (two) times daily.   60 capsule   2    Triage Vitals: BP 171/72  Pulse 80  Temp(Src) 98.1 F (36.7 C) (Oral)  Resp 18  Ht 5\' 6"  (1.676 m)  Wt 197 lb (89.359 kg)  BMI 31.81 kg/m2  SpO2 98%  Physical Exam  Nursing note and vitals reviewed. Constitutional: She is oriented to person, place, and time. She appears well-developed and well-nourished. No distress.  HENT:  Head: Normocephalic.  Eyes: Conjunctivae are normal. Pupils are equal, round, and reactive to light. No scleral icterus.  Neck: Normal range of motion. Neck supple. No thyromegaly present.  Cardiovascular: Normal rate and regular rhythm.  Exam reveals no gallop and no friction rub.   No murmur heard. Pulmonary/Chest: Effort normal and breath sounds normal. No respiratory distress. She has no wheezes. She has no rales.  Abdominal: Soft. Bowel sounds are normal. She exhibits no distension. There is no tenderness. There is no rebound.  Musculoskeletal: Normal range of motion. She exhibits tenderness.  Mid-thoracic back tenderness. No elicited tenderness to right knee upon exam. Stable ligament testing.   Neurological: She is alert and oriented to person, place, and time. No cranial nerve deficit.  Skin: Skin is warm and dry. No rash noted.  Psychiatric: She has a normal mood and affect. Her behavior is normal.    ED Course  Procedures (including critical care time)  DIAGNOSTIC STUDIES: Oxygen Saturation is 98% on room air, normal by my interpretation.    COORDINATION OF CARE: 7:26 PM -Will order Vicodin tablet, thoracic and right knee x-rays, CBC, BMP, urinalysis, and urine culture. Patient verbalizes understanding and agrees with treatment plan.    Labs Review Labs Reviewed  CBC WITH DIFFERENTIAL - Abnormal; Notable for the following:    Hemoglobin 11.2 (*)    All other components within  normal limits  BASIC METABOLIC PANEL - Abnormal; Notable for the following:    BUN 28 (*)    Creatinine, Ser 1.67 (*)    GFR calc non Af Amer 29 (*)    GFR calc Af Amer 34 (*)    All other components within normal limits  URINALYSIS, ROUTINE W REFLEX MICROSCOPIC - Abnormal; Notable for the following:    Leukocytes, UA SMALL (*)    All other components within normal limits  URINE MICROSCOPIC-ADD ON - Abnormal; Notable for the following:    Squamous Epithelial / LPF MANY (*)    Bacteria, UA MANY (*)    Casts HYALINE CASTS (*)    All other components within normal limits  URINE CULTURE   Imaging Review  Dg Thoracic Spine W/swimmers  02/09/2013   CLINICAL DATA:  Fall, upper back pain  EXAM: THORACIC SPINE - 2 VIEW + SWIMMERS  COMPARISON:  None.  FINDINGS: Normal thoracic kyphosis.  No evidence of fracture or dislocation. Vertebral body heights are maintained.  Mild multilevel degenerative changes.  Visualized lungs are clear.  Cholecystectomy clips.  IMPRESSION: No fracture or dislocation  is seen.   Electronically Signed   By: Charline Bills M.D.   On: 02/09/2013 20:30   Dg Knee Complete 4 Views Right  02/09/2013   CLINICAL DATA:  Fall, right knee pain  EXAM: RIGHT KNEE - COMPLETE 4+ VIEW  COMPARISON:  11/29/2012  FINDINGS: No fracture or dislocation is seen.  Mild tricompartmental degenerative changes, most prominent in the patellofemoral compartment. Mild lateral compartment chondrocalcinosis.  No suprapatellar knee joint effusion.  IMPRESSION: No fracture or dislocation is seen.   Electronically Signed   By: Charline Bills M.D.   On: 02/09/2013 20:29    MDM   1. Back pain    Patient still unable to even sit unassisted at the bedside or stand. She has no fractures or acute abnormalities on x-ray. I discussed the case with Dr. via hospitalist Dr. Shayne Alken. Dr. Randa Lynn or return my call immediately as always. Patient be admitted for pain control and further evaluation.   I personally  performed the services described in this documentation, which was scribed in my presence. The recorded information has been reviewed and is accurate.    Roney Marion, MD 02/09/13 2219

## 2013-02-09 NOTE — Telephone Encounter (Signed)
Message relayed through receptionist that sister needs to call pcp

## 2013-02-10 ENCOUNTER — Ambulatory Visit: Payer: Self-pay | Admitting: Podiatry

## 2013-02-10 DIAGNOSIS — D649 Anemia, unspecified: Secondary | ICD-10-CM | POA: Diagnosis present

## 2013-02-10 DIAGNOSIS — W19XXXA Unspecified fall, initial encounter: Secondary | ICD-10-CM

## 2013-02-10 DIAGNOSIS — N189 Chronic kidney disease, unspecified: Secondary | ICD-10-CM | POA: Diagnosis present

## 2013-02-10 DIAGNOSIS — N179 Acute kidney failure, unspecified: Secondary | ICD-10-CM | POA: Diagnosis present

## 2013-02-10 LAB — IRON AND TIBC
Saturation Ratios: 18 % — ABNORMAL LOW (ref 20–55)
TIBC: 182 ug/dL — ABNORMAL LOW (ref 250–470)
UIBC: 149 ug/dL (ref 125–400)

## 2013-02-10 LAB — CBC
HCT: 35 % — ABNORMAL LOW (ref 36.0–46.0)
MCH: 29 pg (ref 26.0–34.0)
MCHC: 30.9 g/dL (ref 30.0–36.0)
MCV: 93.8 fL (ref 78.0–100.0)
Platelets: 213 10*3/uL (ref 150–400)
RDW: 14.4 % (ref 11.5–15.5)
WBC: 5.1 10*3/uL (ref 4.0–10.5)

## 2013-02-10 LAB — COMPREHENSIVE METABOLIC PANEL
ALT: 7 U/L (ref 0–35)
AST: 16 U/L (ref 0–37)
Albumin: 3 g/dL — ABNORMAL LOW (ref 3.5–5.2)
Alkaline Phosphatase: 113 U/L (ref 39–117)
BUN: 25 mg/dL — ABNORMAL HIGH (ref 6–23)
CO2: 27 mEq/L (ref 19–32)
Calcium: 9.9 mg/dL (ref 8.4–10.5)
Chloride: 108 mEq/L (ref 96–112)
Creatinine, Ser: 1.55 mg/dL — ABNORMAL HIGH (ref 0.50–1.10)
GFR calc non Af Amer: 32 mL/min — ABNORMAL LOW (ref >90)
Total Bilirubin: 0.3 mg/dL (ref 0.3–1.2)
Total Protein: 6.3 g/dL (ref 6.0–8.3)

## 2013-02-10 LAB — URINALYSIS, ROUTINE W REFLEX MICROSCOPIC
Glucose, UA: NEGATIVE mg/dL
Hgb urine dipstick: NEGATIVE
Ketones, ur: NEGATIVE mg/dL
Leukocytes, UA: NEGATIVE
Nitrite: NEGATIVE
Protein, ur: NEGATIVE mg/dL
Specific Gravity, Urine: 1.01 (ref 1.005–1.030)
pH: 6.5 (ref 5.0–8.0)

## 2013-02-10 LAB — RETICULOCYTES: Retic Ct Pct: 0.8 % (ref 0.4–3.1)

## 2013-02-10 MED ORDER — ENSURE COMPLETE PO LIQD
237.0000 mL | Freq: Three times a day (TID) | ORAL | Status: DC
  Administered 2013-02-10 – 2013-02-11 (×3): 237 mL via ORAL

## 2013-02-10 MED ORDER — ENOXAPARIN SODIUM 40 MG/0.4ML ~~LOC~~ SOLN
40.0000 mg | Freq: Every day | SUBCUTANEOUS | Status: DC
  Administered 2013-02-10: 40 mg via SUBCUTANEOUS
  Filled 2013-02-10: qty 0.4

## 2013-02-10 MED ORDER — ACETAMINOPHEN 325 MG PO TABS
650.0000 mg | ORAL_TABLET | Freq: Four times a day (QID) | ORAL | Status: DC | PRN
Start: 2013-02-10 — End: 2013-02-11

## 2013-02-10 NOTE — Clinical Social Work Psychosocial (Signed)
Clinical Social Work Department BRIEF PSYCHOSOCIAL ASSESSMENT 02/10/2013  Patient:  Amy Clay, Amy Clay     Account Number:  [id number]     Admit date:  02/09/2013  Clinical Social Worker:  Nancie Neas  Date/Time:  02/10/2013 03:00 PM  Referred by:  Physician  Date Referred:  02/10/2013 Referred for  SNF Placement   Other Referral:   Interview type:  Patient Other interview type:    PSYCHOSOCIAL DATA Living Status:  SIBLING Admitted from facility:   Level of care:   Primary support name:  Amy Clay Primary support relationship to patient:  SIBLING Degree of support available:   supportive    CURRENT CONCERNS Current Concerns  Post-Acute Placement   Other Concerns:    SOCIAL WORK ASSESSMENT / PLAN CSW met with pt at bedside. Pt reports she lives with her sister, Amy Clay who is her best support. She has no children. Pt indicates she also has a cousin, Amy Clay who is a Char[employer] fundraiser. They call her with any medical questions and if they need any additional assistance. Pt reports she is mostly independent, but sometimes requires assistance from her sister with dressing and toileting. Her sister also helps with her medications. She ambulates with a rolling walker. CSW discussed PT evaluation and possibility of SNF. Pt states she wants to go home and feels she can manage there. She is aware of possibility of d/c tomorrow and Medicare coverage/criteria for SNF.   Assessment/plan status:  Referral to [facility] Other assessment/ plan:   Information/referral to community resources:   CM for home health    PATIENT'S/FAMILY'S RESPONSE TO PLAN OF CARE: CSW left voicemail to discuss d/c planning with pt's sister. Requested return call. Pt plans to return home and is not interested in SNF at this time. CM arranging home health.       Derenda Fennel, [place] [phone]

## 2013-02-10 NOTE — Progress Notes (Signed)
UR chart review completed.  

## 2013-02-10 NOTE — Progress Notes (Signed)
Pt's sister [name] set up password "Mayford Knife" for information over the phone.

## 2013-02-10 NOTE — Care Management Note (Signed)
    Page 1 of 2   02/11/2013     10:27:35 AM   CARE MANAGEMENT NOTE 02/11/2013  Patient:  Amy Clay, Amy Clay   Account Number:  [id number]  Date Initiated:  02/10/2013  Documentation initiated by:  Sharrie Rothman  Subjective/Objective Assessment:   Pt admitted from home with fall and back pain. Pt lives with a sister and wants to return home at discharge. Pt has a walker, BSC, and shower chair for home use. Pt has used [facility] in the past.     Action/Plan:   Pt would like [facility] for HH at discharge. Will attempt to speak with pts sister to see what help she can provide in the home. PT has consulted and pt may be a SNF candidate. CSW aware.   Anticipated DC Date:  02/12/2013   Anticipated DC Plan:  HOME W HOME HEALTH SERVICES  In-house referral  Clinical Social Worker      DC Planning Services  CM consult      [facility] Choice  HOME HEALTH   Choice offered to / List presented to:  C-1 Patient        HH arranged  HH-1 RN  HH-2 PT      [facility] agency  [facility]   Status of service:  Completed, signed off Medicare Important Message given?  NA - LOS <3 / Initial given by admissions (If response is "NO", the following Medicare IM given date fields will be blank) Date Medicare IM given:   Date Additional Medicare IM given:    Discharge Disposition:  HOME W HOME HEALTH SERVICES  Per UR Regulation:    If discussed at Long Length of Stay Meetings, dates discussed:    Comments:  02/11/13 1025 Arlyss Queen, RN BSN CM Pt discharging home today with [facility] RN and PT. Alroy Bailiff of [facility] is aware and will collect the pts information from the chart. HH services will start within 48 hours of discharge. No DME needs noted. Pt and pts sister and nurse aware of discharge arrangements.  02/10/13 1110 Arlyss Queen, RN BSN CM

## 2013-02-10 NOTE — Progress Notes (Addendum)
INITIAL NUTRITION ASSESSMENT  DOCUMENTATION CODES Per approved criteria  -Obesity Unspecified   INTERVENTION: Ensure Complete po BID, each supplement provides 350 kcal and 13 grams of protein.  NUTRITION DIAGNOSIS: Inadequate oral intake related to variable appetite as evidenced by pt hx, 5.6% wt loss x 2 months.   Goal: Pt will meet >90% of estimated nutritional needs  Monitor:  PO intake, labs, weight changes, skin integrity, changes in status  Reason for Assessment: MST=2  72 y.o. female  Admitting Dx: <principal problem not specified>  ASSESSMENT: Very pleasant lady admitted for fall and back pain. MD noted reveal dehydration and abnormal UA. Pt reports unintentional weight loss and a variable appetite, "sometimes it's good, sometimes it's not". She reports she did "ok" with breakfast, consuming some eggs, grits, toast, and coffee. She reports that her UBW is 212, but her weight has gotten as low as 197# recently. Wt hx reveals 12# (5.6%) wt loss x 2 months, which is trending toward significant.  She also reports weakness stating "I have nerve problems, that's why I'm here today".  She is agreeable to supplementation to assist in improved intake.   Height: Ht Readings from Last 1 Encounters:  02/09/13 5\' 6"  (1.676 m)    Weight: Wt Readings from Last 1 Encounters:  02/09/13 200 lb 9.9 oz (91 kg)    Ideal Body Weight: 130#  % Ideal Body Weight: 154%  Wt Readings from Last 10 Encounters:  02/09/13 200 lb 9.9 oz (91 kg)  01/12/13 198 lb (89.812 kg)  11/29/12 212 lb (96.163 kg)  11/26/12 212 lb (96.163 kg)  08/27/12 200 lb 9.6 oz (90.992 kg)  07/02/12 203 lb 6.4 oz (92.262 kg)  04/30/12 202 lb 3.2 oz (91.717 kg)  04/02/12 199 lb 3.2 oz (90.357 kg)  02/13/12 213 lb 3 oz (96.7 kg)  01/23/12 200 lb (90.719 kg)    Usual Body Weight: 212#  % Usual Body Weight: 94%  BMI:  Body mass index is 32.4 kg/(m^2). Meets criteria for obesity, class I.  Estimated  Nutritional Needs: Kcal: 1400-1500 daily Protein: 73-91 grams daily Fluid: 1.4-1.5 L daily  Skin: dry with ecchymosis on back  Diet Order: Sodium Restricted  EDUCATION NEEDS: -Education needs addressed   Intake/Output Summary (Last 24 hours) at 02/10/13 1049 Last data filed at 02/10/13 0600  Gross per 24 hour  Intake  477.5 ml  Output      0 ml  Net  477.5 ml    Last BM: PTA   Labs:   Recent Labs Lab 02/09/13 1941 02/10/13 0459  NA 137 141  K 4.8 4.3  CL 101 108  CO2 28 27  BUN 28* 25*  CREATININE 1.67* 1.55*  CALCIUM 10.2 9.9  GLUCOSE 95 83    CBG (last 3)  No results found for this basename: GLUCAP,  in the last 72 hours  Scheduled Meds: . aspirin EC  81 mg Oral Daily  . benztropine  0.5 mg Oral BID  . buPROPion  75 mg Oral q morning - 10a  . darifenacin  15 mg Oral Daily  . docusate sodium  200 mg Oral QHS  . enoxaparin (LOVENOX) injection  30 mg Subcutaneous QHS  . lamoTRIgine  100 mg Oral BID  . losartan  100 mg Oral q morning - 10a  . PARoxetine  40 mg Oral QAC breakfast  . risperiDONE  1 mg Oral Daily  . risperiDONE  1 mg Oral QHS  . terazosin  2 mg Oral  QHS    Continuous Infusions: . sodium chloride 75 mL/hr at 02/09/13 2338    Past Medical History  Diagnosis Date  . High blood pressure   . Arthritis   . Depression   . Anxiety   . Hemorrhoids   . Hyperlipidemia   . Bipolar 1 disorder     Past Surgical History  Procedure Laterality Date  . Back surgery  03/16/87  . Cholecystectomy  08/15/93  . Partial hysterectomy  04/04/95  . Bladder tacked  04/04/95  . Ankle surgery  04/28/97    right ankle fracture  . Wrist surgery  02/24/09    right wrist fracture  . Colonoscopy  10/26/2002    Dr. Karilyn Cota- small external hemorrhoids otherwise normal   . Breast biopsy  05/16/95    right side  . Savory dilation  10/31/2011    Procedure: SAVORY DILATION;  Surgeon: Corbin Ade, MD;  Location: [facility];  Service: Endoscopy;  Laterality:  N/A;  Elease Hashimoto dilation  10/31/2011    Procedure: Elease Hashimoto DILATION;  Surgeon: Corbin Ade, MD;  Location: [facility];  Service: Endoscopy;  Laterality: N/A;  . Abdominal hysterectomy      Sebastion Jun A. Mayford Knife, RD, LDN Pager: [phone]

## 2013-02-10 NOTE — Evaluation (Signed)
Physical Therapy Evaluation Patient Details Name: Amy Clay MRN: [id number] DOB: Mar 12, 1941 Today's Date: 02/10/2013 Time: 2956-2130 PT Time Calculation (min): 42 min  PT Assessment / Plan / Recommendation History of Present Illness  Pt is admitted for a UTI.  She had a fall recently at home and had a painful right knee as well as thoracic spinal muscle pain.  She reported that she was unable to ambulate at home and was very fearful of falling.  Clinical Impression   Pt is seen for evaluation.  She was alert and cooperative, stating that she doesn't walk very much at home.  Her initial knee and back pain appears to be resolved.  She is generally deconditioned and has significant difficulty with as well as  a fear of falling during gait.  Hopefully she will be able to manage at home with her sisters' help.  My concern is that she will be too afraid to walk in the house, get from car to house, etc.  If her sister does not think that they can manage at home, she would be appropriate for SNF.     PT Assessment  Patient needs continued PT services    Follow Up Recommendations  Home health PT;SNF    Does the patient have the potential to tolerate intense rehabilitation      Barriers to Discharge Decreased caregiver support sister is not well according to pt    Equipment Recommendations  None recommended by PT    Recommendations for Other Services     Frequency Min 3X/week    Precautions / Restrictions Precautions Precautions: Fall Restrictions Weight Bearing Restrictions: No   Pertinent Vitals/Pain       Mobility  Bed Mobility Bed Mobility: Supine to Sit Supine to Sit: 4: Min assist;HOB elevated Details for Bed Mobility Assistance: pt has difficulty moving trunk on the bed Transfers Transfers: Sit to Stand;Stand to Sit Sit to Stand: 3: Mod assist;With upper extremity assist;From bed Stand to Sit: 4: Min assist;To chair/3-in-1;With upper extremity  assist Ambulation/Gait Ambulation/Gait Assistance: 4: Min assist Ambulation Distance (Feet): 80 Feet Assistive device: Rolling walker Gait Pattern: Step-through pattern;Decreased stride length;Trunk flexed Gait velocity: WNL General Gait Details: pt is very fearful of falling and only willing to ambulate as long as she had strong hold on the gait belt.  she did not have any LOB during gait but did have difficulty standing to the walker due to tendency to fall backward. Stairs: No Wheelchair Mobility Wheelchair Mobility: No    Exercises General Exercises - Lower Extremity Heel Slides: AROM;Both;10 reps;Supine Hip ABduction/ADduction: AROM;Both;10 reps;Supine Straight Leg Raises: AROM;Both;5 reps;Supine   PT Diagnosis: Difficulty walking;Generalized weakness (abnormal fear of falling)  PT Problem List: Decreased strength;Decreased activity tolerance;Decreased mobility;Obesity PT Treatment Interventions: Gait training;Therapeutic exercise;Functional mobility training     PT Goals(Current goals can be found in the care plan section) Acute Rehab PT Goals Patient Stated Goal: none stated PT Goal Formulation: With patient Time For Goal Achievement: 02/24/13 Potential to Achieve Goals: Good  Visit Information  Last PT Received On: 02/10/13 History of Present Illness: Pt is admitted for a UTI.  She had a fall recently at home and had a painful right knee as well as thoracic spinal muscle pain.  She reported that she was unable to ambulate at home and was very fearful of falling.       Prior Functioning  Home Living Family/patient expects to be discharged to:: Private residence Living Arrangements: Other relatives Available Help at  Discharge: Family Type of Home: House Home Access: Ramped entrance Home Layout: One level Home Equipment: Grab bars - tub/shower;Grab bars - toilet;Cane - quad;Walker - 4 wheels Prior Function Level of Independence: Needs assistance Gait / Transfers  Assistance Needed: pt ambulates independently with a walker, uses a lift chair for going from sit to stand...she states that she often has difficulty transferring in and out of a car ADL's / Homemaking Assistance Needed: needs min assist with bathing and dressing, sister does most of housework    Cognition  Cognition Arousal/Alertness: Awake/alert Behavior During Therapy: WFL for tasks assessed/performed Overall Cognitive Status: Within Functional Limits for tasks assessed    Extremity/Trunk Assessment Lower Extremity Assessment Lower Extremity Assessment: Generalized weakness   Balance Balance Balance Assessed: Yes Static Standing Balance Static Standing - Balance Support: Bilateral upper extremity supported Static Standing - Level of Assistance: 4: Min assist  End of Session PT - End of Session Equipment Utilized During Treatment: Gait belt Activity Tolerance: Patient tolerated treatment well;Patient limited by fatigue Patient left: in chair;with chair alarm set;with call bell/phone within reach Nurse Communication: Mobility status  GP     Konrad Penta 02/10/2013, 9:40 AM

## 2013-02-10 NOTE — Progress Notes (Signed)
[facility] PROGRESS NOTE  Amy Clay ZOX:[id number] DOB: [birth year] DOA: 02/09/2013 PCP: Colette Ribas, MD  Assessment/Plan: Status post fall  Patient does not have any abnormality on the imaging studies which include the thoracic spine as well as the right knee. Pt not orthostatic. Repeat urinalysis unremarkable. CT of head in July atrophy with small vessel ischemic changes. Patient reports coming to hospital due to "im afraid to walk". Denies pain/discomfort in back/knees/legs. Sister is primary caregiver and reports frequent falls over last 6 months due to "she gets scared and i cant manage her". PT evaluation this am recommends SNF due to general deconditioning and difficulty with gait due to fear of falling.   Right knee pain  Resolved. Full ROM. Uric acid within normal limits. Continue Vicodin for pain when necessary   Anemia Normocytic. Chart review indicates current Hg 8.9 just under what appears to be low normal for her. Will check anemia panel. FOBT. No s/sx of bleeding monitor  Abnormal UA  ? Contamination, repeat UA is normal Pt is afebrile and non-toxic appearing.   Maj. Depression  Continue Wellbutrin, Risperdal, Lamictal, Paxil Chart review indicate recent evaluation [facility] Tenny Craw with [facility]. Note indicates concerns for dementia and sundowner's. Her medications were adjusted 01/12/13. Paxil decreased to 40mg  and welbutrin 75mg  started. Sister reports no improvement.    1.   Code Status: full Family Communication: sister by phone Disposition Plan: recommend SNF due to sister's inability to provide care at level patient needs   Consultants:  none  Procedures:  none  Antibiotics:  none  HPI/Subjective: Sitting in chair. Denies pain/discomfort.   Objective: Filed Vitals:   02/10/13 0507  BP: 158/83  Pulse: 77  Temp: 97.5 F (36.4 C)  Resp: 20    Intake/Output Summary (Last 24 hours) at 02/10/13 1210 Last data filed at 02/10/13 0600  Gross  per 24 hour  Intake  477.5 ml  Output      0 ml  Net  477.5 ml   Filed Weights   02/09/13 1857 02/09/13 2310  Weight: 89.359 kg (197 lb) 91 kg (200 lb 9.9 oz)    Exam:   General:  Well nourished NAD  Cardiovascular: RRR No MGR No LE edema  Respiratory: normal effort BS clear bilaterally no wheeze no rhonchi  Abdomen: soft +BS throughout non tender to palpation.   Musculoskeletal: no clubbing no cyanosis  Neuro: oriented to self and place. Cranial nerve II-XII intact.    Data Reviewed: Basic Metabolic Panel:  Recent Labs Lab 02/09/13 1941 02/10/13 0459  NA 137 141  K 4.8 4.3  CL 101 108  CO2 28 27  GLUCOSE 95 83  BUN 28* 25*  CREATININE 1.67* 1.55*  CALCIUM 10.2 9.9   Liver Function Tests:  Recent Labs Lab 02/10/13 0459  AST 16  ALT 7  ALKPHOS 113  BILITOT 0.3  PROT 6.3  ALBUMIN 3.0*   No results found for this basename: LIPASE, AMYLASE,  in the last 168 hours No results found for this basename: AMMONIA,  in the last 168 hours CBC:  Recent Labs Lab 02/09/13 1941 02/10/13 0459  WBC 5.7 5.1  NEUTROABS 4.1  --   HGB 11.2* 10.8*  HCT 36.3 35.0*  MCV 93.8 93.8  PLT 211 213   Cardiac Enzymes: No results found for this basename: CKTOTAL, CKMB, CKMBINDEX, TROPONINI,  in the last 168 hours BNP (last 3 results) No results found for this basename: PROBNP,  in the last 8760 hours CBG:  No results found for this basename: GLUCAP,  in the last 168 hours  No results found for this or any previous visit (from the past 240 hour(s)).   Studies: Dg Thoracic Spine W/swimmers  02/09/2013   CLINICAL DATA:  Fall, upper back pain  EXAM: THORACIC SPINE - 2 VIEW + SWIMMERS  COMPARISON:  None.  FINDINGS: Normal thoracic kyphosis.  No evidence of fracture or dislocation. Vertebral body heights are maintained.  Mild multilevel degenerative changes.  Visualized lungs are clear.  Cholecystectomy clips.  IMPRESSION: No fracture or dislocation is seen.   Electronically  Signed   By: Charline Bills M.D.   On: 02/09/2013 20:30   Dg Knee Complete 4 Views Right  02/09/2013   CLINICAL DATA:  Fall, right knee pain  EXAM: RIGHT KNEE - COMPLETE 4+ VIEW  COMPARISON:  11/29/2012  FINDINGS: No fracture or dislocation is seen.  Mild tricompartmental degenerative changes, most prominent in the patellofemoral compartment. Mild lateral compartment chondrocalcinosis.  No suprapatellar knee joint effusion.  IMPRESSION: No fracture or dislocation is seen.   Electronically Signed   By: Charline Bills M.D.   On: 02/09/2013 20:29    Scheduled Meds: . aspirin EC  81 mg Oral Daily  . benztropine  0.5 mg Oral BID  . buPROPion  75 mg Oral q morning - 10a  . darifenacin  15 mg Oral Daily  . docusate sodium  200 mg Oral QHS  . enoxaparin (LOVENOX) injection  30 mg Subcutaneous QHS  . feeding supplement  237 mL Oral TID WC  . lamoTRIgine  100 mg Oral BID  . losartan  100 mg Oral q morning - 10a  . PARoxetine  40 mg Oral QAC breakfast  . risperiDONE  1 mg Oral Daily  . risperiDONE  1 mg Oral QHS  . terazosin  2 mg Oral QHS   Continuous Infusions: . sodium chloride 75 mL/hr at 02/09/13 2338    Principal Problem:   Acute on chronic renal failure Active Problems:   HIGH BLOOD PRESSURE   Major depression   UTI (lower urinary tract infection)   Fall   Anemia    Time spent: 40 minutes    [facility] M  [facility] Pager [phone]. If 7PM-7AM, please contact night-coverage at [email or web address], password [facility] 02/10/2013, 12:10 PM  LOS: 1 day

## 2013-02-10 NOTE — Progress Notes (Signed)
Patient seen, independently examined and chart reviewed. I agree with exam, assessment and plan discussed with Toya Smothers, NP.  Very pleasant 72 year old woman presented emergency room secondary to fear of walking. She reports a fall several days prior to admission when she struck her mid back. She was admitted because of difficulty ambulating and mobilizing in the emergency department. Other issues included right knee pain and anemia.  Today she overall feels better. She denies back pain, just some mild soreness mid back.  Vitals unremarkable. She appears calm and comfortable sitting in bed. Since 4 without difficulty. She has a large bruise over her back in the mid thoracic area. Minimal tenderness to touch. Moves both legs without difficulty lifting without apparent pain. Laboratory studies suggest mild dehydration. Imaging of the back and knee unremarkable.  She did relatively well with physical therapy and will likely discharge home with home health. She has no objective findings and no significant back pain at this point. Other issues appear to be stable. Continue fluids today. Anticipate discharge 10/2.  Brendia Sacks, MD [facility] [phone]

## 2013-02-11 DIAGNOSIS — N189 Chronic kidney disease, unspecified: Secondary | ICD-10-CM

## 2013-02-11 DIAGNOSIS — N179 Acute kidney failure, unspecified: Principal | ICD-10-CM

## 2013-02-11 DIAGNOSIS — M549 Dorsalgia, unspecified: Secondary | ICD-10-CM

## 2013-02-11 LAB — BASIC METABOLIC PANEL
BUN: 23 mg/dL (ref 6–23)
CO2: 25 mEq/L (ref 19–32)
Chloride: 110 mEq/L (ref 96–112)
Creatinine, Ser: 1.4 mg/dL — ABNORMAL HIGH (ref 0.50–1.10)
Glucose, Bld: 90 mg/dL (ref 70–99)
Potassium: 4.3 mEq/L (ref 3.5–5.1)

## 2013-02-11 LAB — URINE CULTURE
Colony Count: >=100000
Colony Count: 50000

## 2013-02-11 LAB — FOLATE: Folate: >20 ng/mL

## 2013-02-11 LAB — VITAMIN B12: Vitamin B-12: 1682 pg/mL — ABNORMAL HIGH (ref 211–911)

## 2013-02-11 NOTE — Discharge Summary (Addendum)
Patient seen, independently examined and chart reviewed. I agree with exam, assessment and plan discussed with Toya Smothers, NP.  No new issues. Overall she feels better today. She remains afebrile with stable vital signs. Exam unremarkable. Excellent strength bilateral lower extremities. Kidney function appears to have returned to baseline. Hemoglobin appears stable. Urine culture was unrevealing. Repeat urine cultures pending, urinalysis was negative.  Discussed workup with sister at bedside. Plan discharge home today with home health.  Addendum--repeat UC noted, patient asymptomatic at time. Based on clinical circumstances colonization is suspected.  Brendia Sacks, MD [facility] [phone]

## 2013-02-11 NOTE — Progress Notes (Signed)
Patient discharged with instructions given on medications and follow up visits,patient verbalized understanding.Family at bedside. No c/o pain or discomfort noted. Accompanied by staff to an awaiting vehicle.

## 2013-02-11 NOTE — Clinical Social Work Note (Signed)
CSW spoke with pt's sister, Amy Clay whom pt lives with. Amy Clay states she is on her way to hospital now, but wanted to go ahead and talk on phone. Her concern was primarily about Medicare coverage for hospital stay. CM discussed this with Amy Clay. She said she had been considering SNF, but is aware that it would be private pay as pt is stable today for d/c. Pt and Amy Clay agree that they cannot afford this. She is agreeable to pt returning with home health. Pt was not interested in placement yesterday. CSW will sign off.  Derenda Fennel, [place] [phone]

## 2013-02-11 NOTE — Discharge Summary (Signed)
Physician Discharge Summary  Amy Clay JWJ:[id number] DOB: 07-31-1940 DOA: 02/09/2013  PCP: Colette Ribas, MD  Admit date: 02/09/2013 Discharge date: 02/11/2013  Time spent: 40 minutes  Recommendations for Outpatient Follow-up:  1. PCP in 1 week for evaluation of symptoms.  2. HH PT for strength and endurance and HH RN  Discharge Diagnoses:  Principal Problem:   Acute on chronic renal failure Active Problems:   HIGH BLOOD PRESSURE   Major depression   UTI (lower urinary tract infection)   Fall   Anemia   Discharge Condition: stable at baseline  Diet recommendation: low sodium  Filed Weights   02/09/13 1857 02/09/13 2310  Weight: 89.359 kg (197 lb) 91 kg (200 lb 9.9 oz)    History of present illness:  53 old female who was brought to the ED on 02/10/13 with difficulty walking. As per patient's sister patient fell 3 days prior and at that time she was seen by EMS. She hit her back on the edge of the recliner, but was able to get up with help of EMS and was doing fine. Patient was able walk early  morning but after sitting in the chair patient did not want to get up due to fear of fall. She said that she was unstable on walking. Patient uses walker at home. She denied dysuria urgency or frequency of urination. Denied chest pain shortness of breath no nausea vomiting or diarrhea. In the ED x-rays of the thoracic spine as well as right knee performed which are negative for any fractures. Lab work revealed dehydration and abnormal UA. Patient denied any pain.   Hospital Course:  Status post fall  Fall several days prior to admission when she struck her mid back. Patient did not have any abnormality on the imaging studies which include the thoracic spine as well as the right knee. Pt not orthostatic. Repeat urinalysis unremarkable. CT of head in July atrophy with small vessel ischemic changes. Patient reported coming to hospital due to "im afraid to walk". Denied  pain/discomfort in back/knees/legs. Sister is primary caregiver and reported frequent falls over last 6 months due to "she gets scared and i cant manage her". PT evaluation recommended SNF due to general deconditioning and difficulty with gait due to fear of falling. Pt and sister indicate they cannot afford this. Pt will be discharged with [facility] PT and RN.   Acute on chronic renal failure.  Likely related to dehydration from decreased po intake prior to admission. Provided with IV fluids gently. Chart review indicates baseline creatinine 1.3-1.4. On admission creatinine 1.55. On discharge creatine 1.4.    Right knee pain  Resolved. Full ROM. Uric acid within normal limits. Xray right knee yields no fracture or dislocation.  Anemia  Normocytic. Chart review indicates current Hg 8.9 just under what appears to be low normal for her. Anemia panel yields iron 33, TIBC 182. Continue B12. Patient on multi vitamin with iron per sister. Recommend OP follow up.    Abnormal UA  ? Contamination, repeat UA is normal Pt remained afebrile and non-toxic appearing.   Maj. Depression  Continue Wellbutrin, Risperdal, Lamictal, Paxil Chart review indicate recent evaluation Dr. Tenny Craw with [facility]. Note indicates concerns for dementia and sundowner's. Her medications were adjusted 01/12/13. Paxil decreased to 40mg  and welbutrin 75mg  started. Sister reports no improvement. Recommend OP follow up. HH RN for evaluation/assessment.   Procedures:  none  Consultations:  none  Discharge Exam: Filed Vitals:   02/11/13 0539  BP:  168/74  Pulse: 81  Temp: 97.9 F (36.6 C)  Resp: 20    General: obese soft NAD Cardiovascular: RRR No MGR No LE edema Respiratory: normal effort BS clear bilaterally no wheeze or rhonchi Abdomen: soft +BS non-tender to palpation  Discharge Instructions     Medication List         acetaminophen 500 MG tablet  Commonly known as:  TYLENOL  Take 1,000 mg by mouth every 6 (six) hours as  needed for pain.     aspirin EC 81 MG tablet  Take 81 mg by mouth every morning.     benztropine 0.5 MG tablet  Commonly known as:  COGENTIN  Take 1 tablet (0.5 mg total) by mouth 2 (two) times daily.     beta carotene w/minerals tablet  Take 1 tablet by mouth every morning.     Biotin 5000 MCG Caps  Take 1 capsule by mouth every morning.     buPROPion 75 MG tablet  Commonly known as:  WELLBUTRIN  Take 1 tablet (75 mg total) by mouth every morning.     calcium carbonate 600 MG Tabs tablet  Commonly known as:  OS-CAL  Take 600 mg by mouth 2 (two) times daily with a meal.     lamoTRIgine 100 MG tablet  Commonly known as:  LAMICTAL  Take 1 tablet (100 mg total) by mouth 2 (two) times daily.     losartan 100 MG tablet  Commonly known as:  COZAAR  Take 100 mg by mouth every morning.     meloxicam 15 MG tablet  Commonly known as:  MOBIC  Take 15 mg by mouth daily. For pain   *Sometimes patient uses Tylenol Arthritis*     omeprazole 20 MG capsule  Commonly known as:  PRILOSEC  Take 40 mg by mouth every morning.     PARoxetine 40 MG tablet  Commonly known as:  PAXIL  Take 1 tablet (40 mg total) by mouth every morning.     risperiDONE 0.25 MG tablet  Commonly known as:  RISPERDAL  Take 1 in am with breakfast and than may need 2nd in afternoon.     risperiDONE 1 MG tablet  Commonly known as:  RISPERDAL  Take 1 tablet (1 mg total) by mouth at bedtime.     solifenacin 10 MG tablet  Commonly known as:  VESICARE  Take 10 mg by mouth every morning.     STOOL SOFTENER 100 MG capsule  Generic drug:  docusate sodium  Take 200 mg by mouth at bedtime.     terazosin 2 MG capsule  Commonly known as:  HYTRIN  Take 2 mg by mouth at bedtime.     vitamin B-12 1000 MCG tablet  Commonly known as:  CYANOCOBALAMIN  Take 1,000 mcg by mouth daily.     Vitamin D 2000 UNITS Caps  Take 1 capsule by mouth every morning.     vitamin E 400 UNIT capsule  Take 1 capsule (400 Units  total) by mouth 2 (two) times daily.       Allergies  Allergen Reactions  . Benzodiazepines Other (See Comments)    Memory problems on Ativan, which goes away when she is off of it.   . Ciprofloxacin     Rash at site of injection with itching  . Cephalexin Rash and Other (See Comments)    Blistering of the mouth  . Penicillins Rash       Follow-up Information   Follow  up with [facility].   Contact information:   [address] [place] [address] [phone]      Follow up with Colette Ribas, MD. Schedule an appointment as soon as possible for a visit in 1 week. (evaluation of symptoms)    Specialty:  Family Medicine   Contact information:   [address] [place] [address] [phone]        The results of significant diagnostics from this hospitalization (including imaging, microbiology, ancillary and laboratory) are listed below for reference.    Significant Diagnostic Studies: Dg Thoracic Spine W/swimmers  02/09/2013   CLINICAL DATA:  Fall, upper back pain  EXAM: THORACIC SPINE - 2 VIEW + SWIMMERS  COMPARISON:  None.  FINDINGS: Normal thoracic kyphosis.  No evidence of fracture or dislocation. Vertebral body heights are maintained.  Mild multilevel degenerative changes.  Visualized lungs are clear.  Cholecystectomy clips.  IMPRESSION: No fracture or dislocation is seen.   Electronically Signed   By: Charline Bills M.D.   On: 02/09/2013 20:30   Dg Knee Complete 4 Views Right  02/09/2013   CLINICAL DATA:  Fall, right knee pain  EXAM: RIGHT KNEE - COMPLETE 4+ VIEW  COMPARISON:  11/29/2012  FINDINGS: No fracture or dislocation is seen.  Mild tricompartmental degenerative changes, most prominent in the patellofemoral compartment. Mild lateral compartment chondrocalcinosis.  No suprapatellar knee joint effusion.  IMPRESSION: No fracture or dislocation is seen.   Electronically Signed   By: Charline Bills M.D.   On:  02/09/2013 20:29    Microbiology: Recent Results (from the past 240 hour(s))  URINE CULTURE     Status: None   Collection Time    02/09/13  9:11 PM      Result Value Range Status   Specimen Description URINE, CLEAN CATCH   Final   Special Requests NONE   Final   Culture  Setup Time     Final   Value: 02/09/2013 21:25     Performed at [facility] Count     Final   Value: >=100,000 COLONIES/ML     Performed at [facility]   Culture     Final   Value: Multiple bacterial morphotypes present, none predominant. Suggest appropriate recollection if clinically indicated.     Performed at [facility]   Report Status 02/11/2013 FINAL   Final     Labs: Basic Metabolic Panel:  Recent Labs Lab 02/09/13 1941 02/10/13 0459 02/11/13 0449  NA 137 141 143  K 4.8 4.3 4.3  CL 101 108 110  CO2 28 27 25   GLUCOSE 95 83 90  BUN 28* 25* 23  CREATININE 1.67* 1.55* 1.40*  CALCIUM 10.2 9.9 9.1   Liver Function Tests:  Recent Labs Lab 02/10/13 0459  AST 16  ALT 7  ALKPHOS 113  BILITOT 0.3  PROT 6.3  ALBUMIN 3.0*   No results found for this basename: LIPASE, AMYLASE,  in the last 168 hours No results found for this basename: AMMONIA,  in the last 168 hours CBC:  Recent Labs Lab 02/09/13 1941 02/10/13 0459  WBC 5.7 5.1  NEUTROABS 4.1  --   HGB 11.2* 10.8*  HCT 36.3 35.0*  MCV 93.8 93.8  PLT 211 213   Cardiac Enzymes: No results found for this basename: CKTOTAL, CKMB, CKMBINDEX, TROPONINI,  in the last 168 hours BNP: BNP (last 3 results) No results found for this basename:  PROBNP,  in the last 8760 hours CBG: No results found for this basename: GLUCAP,  in the last 168 hours     Signed:  Gwenyth Bender  [facility] 02/11/2013, 11:58 AM

## 2013-02-12 DIAGNOSIS — Z9181 History of falling: Secondary | ICD-10-CM | POA: Diagnosis not present

## 2013-02-12 DIAGNOSIS — L89309 Pressure ulcer of unspecified buttock, unspecified stage: Secondary | ICD-10-CM | POA: Diagnosis not present

## 2013-02-12 DIAGNOSIS — M25569 Pain in unspecified knee: Secondary | ICD-10-CM | POA: Diagnosis not present

## 2013-02-12 DIAGNOSIS — I129 Hypertensive chronic kidney disease with stage 1 through stage 4 chronic kidney disease, or unspecified chronic kidney disease: Secondary | ICD-10-CM | POA: Diagnosis not present

## 2013-02-12 DIAGNOSIS — F319 Bipolar disorder, unspecified: Secondary | ICD-10-CM | POA: Diagnosis not present

## 2013-02-12 DIAGNOSIS — N189 Chronic kidney disease, unspecified: Secondary | ICD-10-CM | POA: Diagnosis not present

## 2013-02-12 DIAGNOSIS — M159 Polyosteoarthritis, unspecified: Secondary | ICD-10-CM | POA: Diagnosis not present

## 2013-02-12 DIAGNOSIS — F329 Major depressive disorder, single episode, unspecified: Secondary | ICD-10-CM | POA: Diagnosis not present

## 2013-02-12 DIAGNOSIS — Z87440 Personal history of urinary (tract) infections: Secondary | ICD-10-CM | POA: Diagnosis not present

## 2013-02-12 DIAGNOSIS — L8992 Pressure ulcer of unspecified site, stage 2: Secondary | ICD-10-CM | POA: Diagnosis not present

## 2013-02-14 ENCOUNTER — Emergency Department (HOSPITAL_COMMUNITY)
Admission: EM | Admit: 2013-02-14 | Discharge: 2013-02-14 | Disposition: A | Discharge status: Home / Self Care | Payer: Medicare Other | Attending: Emergency Medicine | Admitting: Emergency Medicine

## 2013-02-14 ENCOUNTER — Encounter (HOSPITAL_COMMUNITY): Payer: Self-pay | Admitting: *Deleted

## 2013-02-14 DIAGNOSIS — Z9889 Other specified postprocedural states: Secondary | ICD-10-CM | POA: Diagnosis not present

## 2013-02-14 DIAGNOSIS — R5381 Other malaise: Secondary | ICD-10-CM | POA: Diagnosis not present

## 2013-02-14 DIAGNOSIS — R531 Weakness: Secondary | ICD-10-CM

## 2013-02-14 DIAGNOSIS — F319 Bipolar disorder, unspecified: Secondary | ICD-10-CM | POA: Insufficient documentation

## 2013-02-14 DIAGNOSIS — Z791 Long term (current) use of non-steroidal anti-inflammatories (NSAID): Secondary | ICD-10-CM | POA: Diagnosis not present

## 2013-02-14 DIAGNOSIS — Z880 Allergy status to penicillin: Secondary | ICD-10-CM | POA: Diagnosis not present

## 2013-02-14 DIAGNOSIS — Z79899 Other long term (current) drug therapy: Secondary | ICD-10-CM | POA: Diagnosis not present

## 2013-02-14 DIAGNOSIS — E785 Hyperlipidemia, unspecified: Secondary | ICD-10-CM | POA: Diagnosis not present

## 2013-02-14 DIAGNOSIS — R42 Dizziness and giddiness: Secondary | ICD-10-CM | POA: Diagnosis not present

## 2013-02-14 DIAGNOSIS — F411 Generalized anxiety disorder: Secondary | ICD-10-CM | POA: Diagnosis not present

## 2013-02-14 DIAGNOSIS — M129 Arthropathy, unspecified: Secondary | ICD-10-CM | POA: Insufficient documentation

## 2013-02-14 DIAGNOSIS — Z7982 Long term (current) use of aspirin: Secondary | ICD-10-CM | POA: Diagnosis not present

## 2013-02-14 DIAGNOSIS — R404 Transient alteration of awareness: Secondary | ICD-10-CM | POA: Diagnosis not present

## 2013-02-14 DIAGNOSIS — I10 Essential (primary) hypertension: Secondary | ICD-10-CM | POA: Diagnosis not present

## 2013-02-14 DIAGNOSIS — N390 Urinary tract infection, site not specified: Secondary | ICD-10-CM

## 2013-02-14 LAB — CBC
HCT: 35 % — ABNORMAL LOW (ref 36.0–46.0)
Hemoglobin: 11 g/dL — ABNORMAL LOW (ref 12.0–15.0)
MCH: 29.5 pg (ref 26.0–34.0)
MCHC: 31.4 g/dL (ref 30.0–36.0)
RDW: 14.4 % (ref 11.5–15.5)

## 2013-02-14 LAB — URINE MICROSCOPIC-ADD ON

## 2013-02-14 LAB — URINALYSIS, ROUTINE W REFLEX MICROSCOPIC
Ketones, ur: NEGATIVE mg/dL
Nitrite: NEGATIVE
Protein, ur: 30 mg/dL — AB
Specific Gravity, Urine: 1.015 (ref 1.005–1.030)
pH: 6.5 (ref 5.0–8.0)

## 2013-02-14 LAB — BASIC METABOLIC PANEL
BUN: 22 mg/dL (ref 6–23)
Chloride: 102 mEq/L (ref 96–112)
Creatinine, Ser: 1.59 mg/dL — ABNORMAL HIGH (ref 0.50–1.10)
GFR calc Af Amer: 36 mL/min — ABNORMAL LOW (ref >90)
Potassium: 4.2 mEq/L (ref 3.5–5.1)
Sodium: 140 mEq/L (ref 135–145)

## 2013-02-14 MED ORDER — NITROFURANTOIN MONOHYD MACRO 100 MG PO CAPS
100.0000 mg | ORAL_CAPSULE | Freq: Once | ORAL | Status: AC
  Administered 2013-02-14: 100 mg via ORAL
  Filled 2013-02-14: qty 1

## 2013-02-14 MED ORDER — NITROFURANTOIN MONOHYD MACRO 100 MG PO CAPS: 100.0000 mg | ORAL_CAPSULE | Freq: Two times a day (BID) | ORAL | Status: DC

## 2013-02-14 NOTE — ED Notes (Signed)
Pt arrives to er by [facility] with c/o falling in kitchen this am, pt states that she was using her walker to walk when her legs "gave out", causing her to fall, c/o pain to lower back area, pt states that she has been having problems with falls for "awhile", recent admission to hospital for falls, denies any LOC, pt alert, able to answer questions,

## 2013-02-14 NOTE — ED Notes (Signed)
[facility] called for transport home.  It may be awhile, but they will send a truck as soon as available.

## 2013-02-14 NOTE — ED Notes (Signed)
Cousin of patient called and spoke with RN about discharge plan. 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 Requesting that patient be placed for admission/observation overnight. "If she is not admitted, I'll be calling Wyatt Portela in the morning." MD made aware and plan is to discharge home.

## 2013-02-14 NOTE — ED Notes (Signed)
Awaiting [facility] for transport.

## 2013-02-14 NOTE — ED Provider Notes (Addendum)
CSN: [id number]     Arrival date & time    History  This chart was scribed for Lyanne Co, MD by Dorothey Baseman, ED Scribe. This patient was seen in room APA06/APA06 and the patient's care was started at 9:50 AM.    Chief Complaint  Patient presents with  . Fall   The history is provided by the patient. No language interpreter was used.   HPI Comments: Amy Clay is a 72 y.o. female brought in by ambulance who presents to the Emergency Department complaining of constant weakness onset last night. She reports that she fell last night due to weakness, but that she did not fall this morning although she still felt weak. Patient reports associated dizziness, leg weakness, and possible urinary symptoms. Patient reports that this type of leg weakness is normal for her. Patient uses a walker for daily ambulation, but states that she was not using it this morning.  She denies chest pain, shortness of breath, abdominal pain, nausea, emesis, and diarrhea. She denies history of DM. Patient was discharged from the hospital on 02/09/2013 for renal failure, a fall, and UTI. Patient was advised to acquire a live-in nurse for assisted living, but her family reports that they could not afford the care.   PCP- Dr. Phillips Odor  Past Medical History  Diagnosis Date  . High blood pressure   . Arthritis   . Depression   . Anxiety   . Hemorrhoids   . Hyperlipidemia   . Bipolar 1 disorder    Past Surgical History  Procedure Laterality Date  . Back surgery  03/16/87  . Cholecystectomy  08/15/93  . Partial hysterectomy  04/04/95  . Bladder tacked  04/04/95  . Ankle surgery  04/28/97    right ankle fracture  . Wrist surgery  02/24/09    right wrist fracture  . Colonoscopy  10/26/2002    Dr. Karilyn Cota- small external hemorrhoids otherwise normal   . Breast biopsy  05/16/95    right side  . Savory dilation  10/31/2011    Procedure: SAVORY DILATION;  Surgeon: Corbin Ade, MD;  Location: [facility];  Service:  Endoscopy;  Laterality: N/A;  Elease Hashimoto dilation  10/31/2011    Procedure: Elease Hashimoto DILATION;  Surgeon: Corbin Ade, MD;  Location: [facility];  Service: Endoscopy;  Laterality: N/A;  . Abdominal hysterectomy     Family History  Problem Relation Age of Onset  . Arthritis    . Asthma    . Diabetes    . Colon cancer Neg Hx   . Anxiety disorder Paternal Aunt   . Anxiety disorder Paternal Uncle   . Bipolar disorder Cousin    History  Substance Use Topics  . Smoking status: Never Smoker   . Smokeless tobacco: Never Used  . Alcohol Use: No   OB History   Grav Para Term Preterm Abortions TAB SAB Ect Mult Living            0     Review of Systems  A complete 10 system review of systems was obtained and all systems are negative except as noted in the HPI and PMH.   Allergies  Benzodiazepines; Ciprofloxacin; Cephalexin; and Penicillins  Home Medications   Current Outpatient Rx  Name  Route  Sig  Dispense  Refill  . acetaminophen (TYLENOL) 500 MG tablet   Oral   Take 1,000 mg by mouth every 6 (six) hours as needed for pain.         Marland Kitchen  aspirin EC 81 MG tablet   Oral   Take 81 mg by mouth every morning.          . benztropine (COGENTIN) 0.5 MG tablet   Oral   Take 1 tablet (0.5 mg total) by mouth 2 (two) times daily.   60 tablet   1   . beta carotene w/minerals (OCUVITE) tablet   Oral   Take 1 tablet by mouth every morning.          . Biotin 5000 MCG CAPS   Oral   Take 1 capsule by mouth every morning.         Marland Kitchen buPROPion (WELLBUTRIN) 75 MG tablet   Oral   Take 1 tablet (75 mg total) by mouth every morning.   30 tablet   2   . calcium carbonate (OS-CAL) 600 MG TABS   Oral   Take 600 mg by mouth 2 (two) times daily with a meal.          . Cholecalciferol (VITAMIN D) 2000 UNITS CAPS   Oral   Take 1 capsule by mouth every morning.          . docusate sodium (STOOL SOFTENER) 100 MG capsule   Oral   Take 200 mg by mouth at bedtime.         .  lamoTRIgine (LAMICTAL) 100 MG tablet   Oral   Take 1 tablet (100 mg total) by mouth 2 (two) times daily.   60 tablet   1     She is taking 100 mg twice a d ay. Don't give 25 m ...   . losartan (COZAAR) 100 MG tablet   Oral   Take 100 mg by mouth every morning.          . meloxicam (MOBIC) 15 MG tablet   Oral   Take 15 mg by mouth daily. For pain   *Sometimes patient uses Tylenol Arthritis*         . nitrofurantoin, macrocrystal-monohydrate, (MACROBID) 100 MG capsule   Oral   Take 1 capsule (100 mg total) by mouth 2 (two) times daily.   10 capsule   0   . omeprazole (PRILOSEC) 20 MG capsule   Oral   Take 40 mg by mouth every morning.          Marland Kitchen PARoxetine (PAXIL) 40 MG tablet   Oral   Take 1 tablet (40 mg total) by mouth every morning.   30 tablet   1   . risperiDONE (RISPERDAL) 0.25 MG tablet      Take 1 in am with breakfast and than may need 2nd in afternoon.   60 tablet   2   . risperiDONE (RISPERDAL) 1 MG tablet   Oral   Take 1 tablet (1 mg total) by mouth at bedtime.   30 tablet   2   . solifenacin (VESICARE) 10 MG tablet   Oral   Take 10 mg by mouth every morning.          . terazosin (HYTRIN) 2 MG capsule   Oral   Take 2 mg by mouth at bedtime.          . vitamin B-12 (CYANOCOBALAMIN) 1000 MCG tablet   Oral   Take 1,000 mcg by mouth daily.         . vitamin E 400 UNIT capsule   Oral   Take 1 capsule (400 Units total) by mouth 2 (two) times daily.   60  capsule   2    Triage Vitals: BP 148/78  Pulse 82  Temp(Src) 97.6 F (36.4 C) (Oral)  Resp 18  SpO2 100%  Physical Exam  Nursing note and vitals reviewed. Constitutional: She is oriented to person, place, and time. She appears well-developed and well-nourished.  HENT:  Head: Normocephalic.  Eyes: EOM are normal.  Neck: Normal range of motion.  Pulmonary/Chest: Effort normal.  Abdominal: Soft. She exhibits no distension. There is no tenderness.  Musculoskeletal: Normal range  of motion.  Neurological: She is alert and oriented to person, place, and time.  Psychiatric: She has a normal mood and affect.    ED Course  Procedures (including critical care time)  DIAGNOSTIC STUDIES: Oxygen Saturation is 100% on room air, normal by my interpretation.    COORDINATION OF CARE: 9:55AM- Will order labs. Discussed treatment plan with patient at bedside and patient verbalized agreement.     Labs Review Labs Reviewed  CBC - Abnormal; Notable for the following:    RBC 3.73 (*)    Hemoglobin 11.0 (*)    HCT 35.0 (*)    All other components within normal limits  BASIC METABOLIC PANEL - Abnormal; Notable for the following:    Glucose, Bld 146 (*)    Creatinine, Ser 1.59 (*)    GFR calc non Af Amer 31 (*)    GFR calc Af Amer 36 (*)    All other components within normal limits  URINALYSIS, ROUTINE W REFLEX MICROSCOPIC - Abnormal; Notable for the following:    Hgb urine dipstick SMALL (*)    Leukocytes, UA MODERATE (*)    All other components within normal limits  URINE MICROSCOPIC-ADD ON - Abnormal; Notable for the following:    Bacteria, UA MANY (*)    All other components within normal limits   Imaging Review No results found.  MDM   1. Weakness   2. Urinary tract infection    10:43 AM Patient has generalized weakness.  She does appear to have urinary tract infection.  She's been prescribed Macrobid.  She is allergic to cephalosporins.  Close PCP followup.  Her last hospitalization resulted in physical therapy recommending skilled nursing facility however at that time the patient and the patient's daughter declined.  She has home health services available.  PCP followup.  I still think she would benefit from skilled nursing facility this can be embarked upon as an outpatient with PCP assistance.  No indication for admission the hospital today.  No fall.  The patient was not walking with her walker.  Recommended that she always have her walker with her.  I  personally performed the services described in this documentation, which was scribed in my presence. The recorded information has been reviewed and is accurate.      Lyanne Co, MD 02/14/13 1045  11:08 AM Family is concerned about the ability to care for this patient home.  They're recommended to be placed in skilled nursing facility during the last hospitalization.  No indication for admission the hospital.  Patient has home health coming to the house.  Patient will contact the primary care physician as well as [facility] tomorrow for additional assistance.  A message was left with the anti-pain social worker to contact his family tomorrow to see if they can assist the family with placement issues and home health needs.  Patient was prescribed a wheelchair.  Discharge home in good condition.  Lyanne Co, MD 02/14/13 [phone]

## 2013-02-14 NOTE — ED Notes (Signed)
Message left for Anibal Henderson to call Pts family on Monday, as requested by Dr. Patria Mane.

## 2013-02-14 NOTE — ED Notes (Signed)
Patient with no complaints at this time. Respirations even and unlabored. Skin warm/dry. Discharge instructions reviewed with patient at this time. Patient given opportunity to voice concerns/ask questions. IV removed per policy and band-aid applied to site. Patient discharged at this time and left Emergency Department via wheelchair.  

## 2013-02-15 DIAGNOSIS — I129 Hypertensive chronic kidney disease with stage 1 through stage 4 chronic kidney disease, or unspecified chronic kidney disease: Secondary | ICD-10-CM | POA: Diagnosis not present

## 2013-02-15 DIAGNOSIS — M159 Polyosteoarthritis, unspecified: Secondary | ICD-10-CM | POA: Diagnosis not present

## 2013-02-15 DIAGNOSIS — F329 Major depressive disorder, single episode, unspecified: Secondary | ICD-10-CM | POA: Diagnosis not present

## 2013-02-15 DIAGNOSIS — L89309 Pressure ulcer of unspecified buttock, unspecified stage: Secondary | ICD-10-CM | POA: Diagnosis not present

## 2013-02-15 DIAGNOSIS — L8992 Pressure ulcer of unspecified site, stage 2: Secondary | ICD-10-CM | POA: Diagnosis not present

## 2013-02-15 DIAGNOSIS — F319 Bipolar disorder, unspecified: Secondary | ICD-10-CM | POA: Diagnosis not present

## 2013-02-15 NOTE — Progress Notes (Signed)
Note was left from Dr Patria Mane for CM to call patient's sister [name], to check on  home situation of  Enslie Sahota who was in ED on Sunday. They have [facility] active in the home, and the therapist, Kennyth Arnold has called this am and is coming today, the aide is coming, and Kennyth Arnold has called the social worker to come and discuss the home situation. [name] agrees that she is no longer able to care for herself and her sister, and that Shanikqua will need placement in ALF or NH. She will discuss this with the social worker and try to work have  him  find a place for her  sister to live. She understands they may  have to pay for this until they spend down her sister's savings and she will then qualify for Medicaid. [name] appreciated the call from CM.

## 2013-02-16 DIAGNOSIS — M159 Polyosteoarthritis, unspecified: Secondary | ICD-10-CM | POA: Diagnosis not present

## 2013-02-16 DIAGNOSIS — L8992 Pressure ulcer of unspecified site, stage 2: Secondary | ICD-10-CM | POA: Diagnosis not present

## 2013-02-16 DIAGNOSIS — L89309 Pressure ulcer of unspecified buttock, unspecified stage: Secondary | ICD-10-CM | POA: Diagnosis not present

## 2013-02-16 DIAGNOSIS — I129 Hypertensive chronic kidney disease with stage 1 through stage 4 chronic kidney disease, or unspecified chronic kidney disease: Secondary | ICD-10-CM | POA: Diagnosis not present

## 2013-02-16 DIAGNOSIS — F329 Major depressive disorder, single episode, unspecified: Secondary | ICD-10-CM | POA: Diagnosis not present

## 2013-02-16 DIAGNOSIS — F319 Bipolar disorder, unspecified: Secondary | ICD-10-CM | POA: Diagnosis not present

## 2013-02-16 LAB — URINE CULTURE: Colony Count: >=100000

## 2013-02-17 ENCOUNTER — Telehealth (HOSPITAL_COMMUNITY): Payer: Self-pay | Admitting: Emergency Medicine

## 2013-02-17 DIAGNOSIS — M159 Polyosteoarthritis, unspecified: Secondary | ICD-10-CM | POA: Diagnosis not present

## 2013-02-17 DIAGNOSIS — L8992 Pressure ulcer of unspecified site, stage 2: Secondary | ICD-10-CM | POA: Diagnosis not present

## 2013-02-17 DIAGNOSIS — I129 Hypertensive chronic kidney disease with stage 1 through stage 4 chronic kidney disease, or unspecified chronic kidney disease: Secondary | ICD-10-CM | POA: Diagnosis not present

## 2013-02-17 DIAGNOSIS — F319 Bipolar disorder, unspecified: Secondary | ICD-10-CM | POA: Diagnosis not present

## 2013-02-17 DIAGNOSIS — L89309 Pressure ulcer of unspecified buttock, unspecified stage: Secondary | ICD-10-CM | POA: Diagnosis not present

## 2013-02-17 DIAGNOSIS — F329 Major depressive disorder, single episode, unspecified: Secondary | ICD-10-CM | POA: Diagnosis not present

## 2013-02-17 DIAGNOSIS — Z0000 Encounter for general adult medical examination without abnormal findings: Secondary | ICD-10-CM | POA: Diagnosis not present

## 2013-02-17 NOTE — ED Notes (Signed)
Post ED Visit - Positive Culture Follow-up  Culture report reviewed by antimicrobial stewardship pharmacist: []  Wes Dulaney, Pharm.D., BCPS [x]  Celedonio Miyamoto, Pharm.D., BCPS []  Georgina Pillion, [address].D., BCPS []  [place], [place].D., BCPS, AAHIVP []  Estella Husk, Pharm.D., BCPS, AAHIVP  Positive urine culture Treated with Macrobid, organism sensitive to the same and no further patient follow-up is required at this time.  Kylie A Holland 02/17/2013, 11:41 AM

## 2013-02-18 DIAGNOSIS — L89309 Pressure ulcer of unspecified buttock, unspecified stage: Secondary | ICD-10-CM | POA: Diagnosis not present

## 2013-02-18 DIAGNOSIS — I129 Hypertensive chronic kidney disease with stage 1 through stage 4 chronic kidney disease, or unspecified chronic kidney disease: Secondary | ICD-10-CM | POA: Diagnosis not present

## 2013-02-18 DIAGNOSIS — F329 Major depressive disorder, single episode, unspecified: Secondary | ICD-10-CM | POA: Diagnosis not present

## 2013-02-18 DIAGNOSIS — L8992 Pressure ulcer of unspecified site, stage 2: Secondary | ICD-10-CM | POA: Diagnosis not present

## 2013-02-18 DIAGNOSIS — F319 Bipolar disorder, unspecified: Secondary | ICD-10-CM | POA: Diagnosis not present

## 2013-02-18 DIAGNOSIS — M159 Polyosteoarthritis, unspecified: Secondary | ICD-10-CM | POA: Diagnosis not present

## 2013-02-19 DIAGNOSIS — I129 Hypertensive chronic kidney disease with stage 1 through stage 4 chronic kidney disease, or unspecified chronic kidney disease: Secondary | ICD-10-CM | POA: Diagnosis not present

## 2013-02-19 DIAGNOSIS — L89309 Pressure ulcer of unspecified buttock, unspecified stage: Secondary | ICD-10-CM | POA: Diagnosis not present

## 2013-02-19 DIAGNOSIS — M159 Polyosteoarthritis, unspecified: Secondary | ICD-10-CM | POA: Diagnosis not present

## 2013-02-19 DIAGNOSIS — F329 Major depressive disorder, single episode, unspecified: Secondary | ICD-10-CM | POA: Diagnosis not present

## 2013-02-19 DIAGNOSIS — N058 Unspecified nephritic syndrome with other morphologic changes: Secondary | ICD-10-CM | POA: Diagnosis not present

## 2013-02-19 DIAGNOSIS — F319 Bipolar disorder, unspecified: Secondary | ICD-10-CM | POA: Diagnosis not present

## 2013-02-19 DIAGNOSIS — N390 Urinary tract infection, site not specified: Secondary | ICD-10-CM | POA: Diagnosis not present

## 2013-02-19 DIAGNOSIS — L8992 Pressure ulcer of unspecified site, stage 2: Secondary | ICD-10-CM | POA: Diagnosis not present

## 2013-02-22 ENCOUNTER — Other Ambulatory Visit (HOSPITAL_COMMUNITY): Payer: Self-pay | Admitting: Nephrology

## 2013-02-23 DIAGNOSIS — L8992 Pressure ulcer of unspecified site, stage 2: Secondary | ICD-10-CM | POA: Diagnosis not present

## 2013-02-23 DIAGNOSIS — I129 Hypertensive chronic kidney disease with stage 1 through stage 4 chronic kidney disease, or unspecified chronic kidney disease: Secondary | ICD-10-CM | POA: Diagnosis not present

## 2013-02-23 DIAGNOSIS — M159 Polyosteoarthritis, unspecified: Secondary | ICD-10-CM | POA: Diagnosis not present

## 2013-02-23 DIAGNOSIS — Z6832 Body mass index (BMI) 32.0-32.9, adult: Secondary | ICD-10-CM | POA: Diagnosis not present

## 2013-02-23 DIAGNOSIS — F319 Bipolar disorder, unspecified: Secondary | ICD-10-CM | POA: Diagnosis not present

## 2013-02-23 DIAGNOSIS — N390 Urinary tract infection, site not specified: Secondary | ICD-10-CM | POA: Diagnosis not present

## 2013-02-23 DIAGNOSIS — F329 Major depressive disorder, single episode, unspecified: Secondary | ICD-10-CM | POA: Diagnosis not present

## 2013-02-23 DIAGNOSIS — L89309 Pressure ulcer of unspecified buttock, unspecified stage: Secondary | ICD-10-CM | POA: Diagnosis not present

## 2013-02-25 DIAGNOSIS — L89309 Pressure ulcer of unspecified buttock, unspecified stage: Secondary | ICD-10-CM | POA: Diagnosis not present

## 2013-02-25 DIAGNOSIS — M159 Polyosteoarthritis, unspecified: Secondary | ICD-10-CM | POA: Diagnosis not present

## 2013-02-25 DIAGNOSIS — F329 Major depressive disorder, single episode, unspecified: Secondary | ICD-10-CM | POA: Diagnosis not present

## 2013-02-25 DIAGNOSIS — F319 Bipolar disorder, unspecified: Secondary | ICD-10-CM | POA: Diagnosis not present

## 2013-02-25 DIAGNOSIS — I129 Hypertensive chronic kidney disease with stage 1 through stage 4 chronic kidney disease, or unspecified chronic kidney disease: Secondary | ICD-10-CM | POA: Diagnosis not present

## 2013-02-25 DIAGNOSIS — L8992 Pressure ulcer of unspecified site, stage 2: Secondary | ICD-10-CM | POA: Diagnosis not present

## 2013-02-26 DIAGNOSIS — L8992 Pressure ulcer of unspecified site, stage 2: Secondary | ICD-10-CM | POA: Diagnosis not present

## 2013-02-26 DIAGNOSIS — F329 Major depressive disorder, single episode, unspecified: Secondary | ICD-10-CM | POA: Diagnosis not present

## 2013-02-26 DIAGNOSIS — F319 Bipolar disorder, unspecified: Secondary | ICD-10-CM | POA: Diagnosis not present

## 2013-02-26 DIAGNOSIS — M159 Polyosteoarthritis, unspecified: Secondary | ICD-10-CM | POA: Diagnosis not present

## 2013-02-26 DIAGNOSIS — L89309 Pressure ulcer of unspecified buttock, unspecified stage: Secondary | ICD-10-CM | POA: Diagnosis not present

## 2013-02-26 DIAGNOSIS — I129 Hypertensive chronic kidney disease with stage 1 through stage 4 chronic kidney disease, or unspecified chronic kidney disease: Secondary | ICD-10-CM | POA: Diagnosis not present

## 2013-03-01 DIAGNOSIS — F329 Major depressive disorder, single episode, unspecified: Secondary | ICD-10-CM | POA: Diagnosis not present

## 2013-03-01 DIAGNOSIS — M159 Polyosteoarthritis, unspecified: Secondary | ICD-10-CM | POA: Diagnosis not present

## 2013-03-01 DIAGNOSIS — L89309 Pressure ulcer of unspecified buttock, unspecified stage: Secondary | ICD-10-CM | POA: Diagnosis not present

## 2013-03-01 DIAGNOSIS — F319 Bipolar disorder, unspecified: Secondary | ICD-10-CM | POA: Diagnosis not present

## 2013-03-01 DIAGNOSIS — L8992 Pressure ulcer of unspecified site, stage 2: Secondary | ICD-10-CM | POA: Diagnosis not present

## 2013-03-01 DIAGNOSIS — I129 Hypertensive chronic kidney disease with stage 1 through stage 4 chronic kidney disease, or unspecified chronic kidney disease: Secondary | ICD-10-CM | POA: Diagnosis not present

## 2013-03-02 DIAGNOSIS — I129 Hypertensive chronic kidney disease with stage 1 through stage 4 chronic kidney disease, or unspecified chronic kidney disease: Secondary | ICD-10-CM | POA: Diagnosis not present

## 2013-03-02 DIAGNOSIS — L8992 Pressure ulcer of unspecified site, stage 2: Secondary | ICD-10-CM | POA: Diagnosis not present

## 2013-03-02 DIAGNOSIS — F329 Major depressive disorder, single episode, unspecified: Secondary | ICD-10-CM | POA: Diagnosis not present

## 2013-03-02 DIAGNOSIS — F319 Bipolar disorder, unspecified: Secondary | ICD-10-CM | POA: Diagnosis not present

## 2013-03-02 DIAGNOSIS — M159 Polyosteoarthritis, unspecified: Secondary | ICD-10-CM | POA: Diagnosis not present

## 2013-03-02 DIAGNOSIS — L89309 Pressure ulcer of unspecified buttock, unspecified stage: Secondary | ICD-10-CM | POA: Diagnosis not present

## 2013-03-03 ENCOUNTER — Ambulatory Visit (HOSPITAL_COMMUNITY)
Admission: RE | Admit: 2013-03-03 | Discharge: 2013-03-03 | Disposition: A | Discharge status: Home / Self Care | Payer: Medicare Other | Source: Ambulatory Visit | Attending: Nephrology | Admitting: Nephrology

## 2013-03-03 DIAGNOSIS — L8992 Pressure ulcer of unspecified site, stage 2: Secondary | ICD-10-CM | POA: Diagnosis not present

## 2013-03-03 DIAGNOSIS — N189 Chronic kidney disease, unspecified: Secondary | ICD-10-CM | POA: Diagnosis not present

## 2013-03-03 DIAGNOSIS — F319 Bipolar disorder, unspecified: Secondary | ICD-10-CM | POA: Diagnosis not present

## 2013-03-03 DIAGNOSIS — F329 Major depressive disorder, single episode, unspecified: Secondary | ICD-10-CM | POA: Diagnosis not present

## 2013-03-03 DIAGNOSIS — I129 Hypertensive chronic kidney disease with stage 1 through stage 4 chronic kidney disease, or unspecified chronic kidney disease: Secondary | ICD-10-CM | POA: Diagnosis not present

## 2013-03-03 DIAGNOSIS — L89309 Pressure ulcer of unspecified buttock, unspecified stage: Secondary | ICD-10-CM | POA: Diagnosis not present

## 2013-03-03 DIAGNOSIS — M159 Polyosteoarthritis, unspecified: Secondary | ICD-10-CM | POA: Diagnosis not present

## 2013-03-04 DIAGNOSIS — I129 Hypertensive chronic kidney disease with stage 1 through stage 4 chronic kidney disease, or unspecified chronic kidney disease: Secondary | ICD-10-CM | POA: Diagnosis not present

## 2013-03-04 DIAGNOSIS — F319 Bipolar disorder, unspecified: Secondary | ICD-10-CM | POA: Diagnosis not present

## 2013-03-04 DIAGNOSIS — L8992 Pressure ulcer of unspecified site, stage 2: Secondary | ICD-10-CM | POA: Diagnosis not present

## 2013-03-04 DIAGNOSIS — M159 Polyosteoarthritis, unspecified: Secondary | ICD-10-CM | POA: Diagnosis not present

## 2013-03-04 DIAGNOSIS — F329 Major depressive disorder, single episode, unspecified: Secondary | ICD-10-CM | POA: Diagnosis not present

## 2013-03-04 DIAGNOSIS — L89309 Pressure ulcer of unspecified buttock, unspecified stage: Secondary | ICD-10-CM | POA: Diagnosis not present

## 2013-03-08 DIAGNOSIS — M159 Polyosteoarthritis, unspecified: Secondary | ICD-10-CM | POA: Diagnosis not present

## 2013-03-08 DIAGNOSIS — F329 Major depressive disorder, single episode, unspecified: Secondary | ICD-10-CM | POA: Diagnosis not present

## 2013-03-08 DIAGNOSIS — L89309 Pressure ulcer of unspecified buttock, unspecified stage: Secondary | ICD-10-CM | POA: Diagnosis not present

## 2013-03-08 DIAGNOSIS — F319 Bipolar disorder, unspecified: Secondary | ICD-10-CM | POA: Diagnosis not present

## 2013-03-08 DIAGNOSIS — I129 Hypertensive chronic kidney disease with stage 1 through stage 4 chronic kidney disease, or unspecified chronic kidney disease: Secondary | ICD-10-CM | POA: Diagnosis not present

## 2013-03-08 DIAGNOSIS — L8992 Pressure ulcer of unspecified site, stage 2: Secondary | ICD-10-CM | POA: Diagnosis not present

## 2013-03-11 DIAGNOSIS — F319 Bipolar disorder, unspecified: Secondary | ICD-10-CM | POA: Diagnosis not present

## 2013-03-11 DIAGNOSIS — L8992 Pressure ulcer of unspecified site, stage 2: Secondary | ICD-10-CM | POA: Diagnosis not present

## 2013-03-11 DIAGNOSIS — I129 Hypertensive chronic kidney disease with stage 1 through stage 4 chronic kidney disease, or unspecified chronic kidney disease: Secondary | ICD-10-CM | POA: Diagnosis not present

## 2013-03-11 DIAGNOSIS — F329 Major depressive disorder, single episode, unspecified: Secondary | ICD-10-CM | POA: Diagnosis not present

## 2013-03-11 DIAGNOSIS — M159 Polyosteoarthritis, unspecified: Secondary | ICD-10-CM | POA: Diagnosis not present

## 2013-03-11 DIAGNOSIS — L89309 Pressure ulcer of unspecified buttock, unspecified stage: Secondary | ICD-10-CM | POA: Diagnosis not present

## 2013-03-12 DIAGNOSIS — F319 Bipolar disorder, unspecified: Secondary | ICD-10-CM | POA: Diagnosis not present

## 2013-03-12 DIAGNOSIS — M159 Polyosteoarthritis, unspecified: Secondary | ICD-10-CM | POA: Diagnosis not present

## 2013-03-12 DIAGNOSIS — F329 Major depressive disorder, single episode, unspecified: Secondary | ICD-10-CM | POA: Diagnosis not present

## 2013-03-12 DIAGNOSIS — L8992 Pressure ulcer of unspecified site, stage 2: Secondary | ICD-10-CM | POA: Diagnosis not present

## 2013-03-12 DIAGNOSIS — N390 Urinary tract infection, site not specified: Secondary | ICD-10-CM | POA: Diagnosis not present

## 2013-03-12 DIAGNOSIS — I129 Hypertensive chronic kidney disease with stage 1 through stage 4 chronic kidney disease, or unspecified chronic kidney disease: Secondary | ICD-10-CM | POA: Diagnosis not present

## 2013-03-12 DIAGNOSIS — L89309 Pressure ulcer of unspecified buttock, unspecified stage: Secondary | ICD-10-CM | POA: Diagnosis not present

## 2013-03-15 DIAGNOSIS — M159 Polyosteoarthritis, unspecified: Secondary | ICD-10-CM | POA: Diagnosis not present

## 2013-03-15 DIAGNOSIS — F319 Bipolar disorder, unspecified: Secondary | ICD-10-CM | POA: Diagnosis not present

## 2013-03-15 DIAGNOSIS — L8992 Pressure ulcer of unspecified site, stage 2: Secondary | ICD-10-CM | POA: Diagnosis not present

## 2013-03-15 DIAGNOSIS — F329 Major depressive disorder, single episode, unspecified: Secondary | ICD-10-CM | POA: Diagnosis not present

## 2013-03-15 DIAGNOSIS — I129 Hypertensive chronic kidney disease with stage 1 through stage 4 chronic kidney disease, or unspecified chronic kidney disease: Secondary | ICD-10-CM | POA: Diagnosis not present

## 2013-03-15 DIAGNOSIS — L89309 Pressure ulcer of unspecified buttock, unspecified stage: Secondary | ICD-10-CM | POA: Diagnosis not present

## 2013-03-16 ENCOUNTER — Ambulatory Visit (INDEPENDENT_AMBULATORY_CARE_PROVIDER_SITE_OTHER): Payer: Medicare Other | Admitting: Psychiatry

## 2013-03-16 ENCOUNTER — Encounter (HOSPITAL_COMMUNITY): Payer: Self-pay | Admitting: Psychiatry

## 2013-03-16 VITALS — BP 140/80 | Wt 207.0 lb

## 2013-03-16 DIAGNOSIS — F1999 Other psychoactive substance use, unspecified with unspecified psychoactive substance-induced disorder: Secondary | ICD-10-CM

## 2013-03-16 DIAGNOSIS — F329 Major depressive disorder, single episode, unspecified: Secondary | ICD-10-CM

## 2013-03-16 DIAGNOSIS — F09 Unspecified mental disorder due to known physiological condition: Secondary | ICD-10-CM

## 2013-03-16 DIAGNOSIS — F331 Major depressive disorder, recurrent, moderate: Secondary | ICD-10-CM

## 2013-03-16 DIAGNOSIS — F411 Generalized anxiety disorder: Secondary | ICD-10-CM

## 2013-03-16 MED ORDER — BUPROPION HCL ER (XL) 150 MG PO TB24: 150.0000 mg | ORAL_TABLET | ORAL | Status: DC

## 2013-03-16 NOTE — Progress Notes (Signed)
Patient ID: Amy Clay, female   DOB: 05/11/1941, 72 y.o.   MRN: [id number] Patient ID: Amy Clay, female   DOB: [birth year], 72 y.o.   MRN: [id number] [facility] [address] Progress Note Amy Clay MRN: [id number] DOB: [birth year] Age: 72 y.o.  Date: 03/16/2013  Chief Complaint  Patient presents with  . Altered Mental Status  . Anxiety  . Depression  . Follow-up   History of presenting illness Patient is 72 year old Caucasian female who came with her sister for her followup appointment. She and her sister live together in the family farm outside of [place]. Neither one has ever been married or have had children. The patient worked until the early 80s when she began to have difficulties with her nerves.  Currently the patient is still struggling with her mental and physical health. She was admitted last year to a psychiatric hospital in [place] because she was anxious nervous and depressed. Last time Dr. Lolly Clay increased her Risperdal which is helped somewhat. She can't walk and it's not clear why much of it seems to be anxiety based and she seems to have a fear of falling. She's also recently developed possible renal failure and is can be referred to a nephrologist. She was on lithium for a number of years. We do not have her leak recent records from her primary care physician who is outside of [place].  Currently the patient is still depressed despite being on 60 mg of Paxil a day. She has no energy. She sits and watches TV all the time and worries constantly. She's not suicidal but her mood is low. Her memory has been failing her and she gets confused in the afternoons and agitated. A recent brain CT indicated a atrophy and significant white matter disease which is consistent with early dementia.  The patient returns after 2 months. She's now living in the [facility] assisted care facility. She was admitted to the hospital last month because she was  constantly falling. She went home for a few days and started falling again. Her sister and will social worker from the home health agency daughter the best for her to live in assisted living. She was doing better there for the first 3 weeks but this past week she's been depressed and tearful. She's here today with her sister Amy Clay and her aide Amy Clay. She constantly states that she wants to go home. She's tearful and angry with her sister for putting her in assisted living.  In some way she's doing better. She's sleeping and eating better in assisted living. She's getting physical therapy and her walking is getting stronger. She's got more depressed last week or so and has been crying and obsessive about going home. I told her sister we might try an increase in her Wellbutrin  Past psychiatric history Patient is seeing psychiatrist since 1997 in this office.  Her last psychiatric admission was at [facility] after having suicidal thoughts.  She denies any history of suicidal attempt however endorse history of hopeless feeling.  She is diagnosed with major depressive disorder. She she was very stable on lithium until her creatinine went up and lithium was discontinued.  We had recently tried Lamictal and the dose has been increased recently.  In the past we had tried BuSpar which was discontinued at hospital.  She has another psychiatric inpatient treatment in 90s. She admitted history of mania, depression and psychosis.  Social history Patient lives with her sister.  She  has no children.  Her husband died in nursing home.  Patient has no other family.  Medical history Patient was recently admitted due to dehydration and UTI.  Patient has history of arthritis blood pressure and chronic pain. Her primary care physician is Dr. Phillips Clay at [facility].  She takes medication for her blood pressure and arthritis.  She had history of jerking movements, fall and generalized pain. Her  last blood work was done on 02/14/2012 which shows anemia, her creatinine was 1.33.  She was given antibiotic result UTI.  Her WBC count was 10.4.  Family History family history includes Anxiety disorder in her paternal aunt and paternal uncle; Arthritis in an other family member; Asthma in an other family member; Bipolar disorder in her cousin; Diabetes in an other family member. There is no history of Colon cancer.  Review of Systems  Constitutional: Positive for malaise/fatigue.  Skin: Negative.   Neurological: Positive for tremors, weakness and headaches.  Psychiatric/Behavioral: Positive for memory loss. Negative for suicidal ideas, hallucinations and substance abuse. The patient is nervous/anxious. The patient does not have insomnia.      Mental status examination Patient is casually dressed and fairly groomed.  She she is tearful  She described her mood is anxious.  Her voice is high-pitched and soft. She she constantly repeats that she wants to go home .Her affect is constricted.  Her attention and concentration is poor.  She has poverty of thought content.  She doesn't auditory or visual hallucination.  There were no paranoia or delusion obsession present at this time.  Her fund of knowledge is okay.  There are no tremors and shakes in her hand.  She's alert and oriented x3.  Her insight judgment and impulse control is okay.  Lab Results:  Results for orders placed during the hospital encounter of 02/14/13 (from the past 8736 hour(s))  CBC   Collection Time    02/14/13 10:07 AM      Result Value Range   WBC 10.1  4.0 - 10.5 K/uL   RBC 3.73 (*) 3.87 - 5.11 MIL/uL   Hemoglobin 11.0 (*) 12.0 - 15.0 g/dL   HCT 16.1 (*) 09.6 - 04.5 %   MCV 93.8  78.0 - 100.0 fL   MCH 29.5  26.0 - 34.0 pg   MCHC 31.4  30.0 - 36.0 g/dL   RDW 40.9  81.1 - 91.4 %   Platelets 204  150 - 400 K/uL  BASIC METABOLIC PANEL   Collection Time    02/14/13 10:07 AM      Result Value Range   Sodium 140  135 -  145 mEq/L   Potassium 4.2  3.5 - 5.1 mEq/L   Chloride 102  96 - 112 mEq/L   CO2 27  19 - 32 mEq/L   Glucose, Bld 146 (*) 70 - 99 mg/dL   BUN 22  6 - 23 mg/dL   Creatinine, Ser 7.82 (*) 0.50 - 1.10 mg/dL   Calcium 95.6  8.4 - 21.3 mg/dL   GFR calc non Af Amer 31 (*) >90 mL/min   GFR calc Af Amer 36 (*) >90 mL/min  URINALYSIS, ROUTINE W REFLEX MICROSCOPIC   Collection Time    02/14/13 10:20 AM      Result Value Range   Color, Urine YELLOW  YELLOW   APPearance CLEAR  CLEAR   Specific Gravity, Urine 1.015  1.005 - 1.030   pH 6.5  5.0 - 8.0   Glucose, UA  NEGATIVE  NEGATIVE mg/dL   Hgb urine dipstick MODERATE (*) NEGATIVE   Bilirubin Urine NEGATIVE  NEGATIVE   Ketones, ur NEGATIVE  NEGATIVE mg/dL   Protein, ur 30 (*) NEGATIVE mg/dL   Urobilinogen, UA 0.2  0.0 - 1.0 mg/dL   Nitrite NEGATIVE  NEGATIVE   Leukocytes, UA LARGE (*) NEGATIVE  URINE MICROSCOPIC-ADD ON   Collection Time    02/14/13 10:20 AM      Result Value Range   WBC, UA TOO NUMEROUS TO COUNT  <3 WBC/hpf   RBC / HPF TOO NUMEROUS TO COUNT  <3 RBC/hpf   Bacteria, UA MANY (*) RARE  URINE CULTURE   Collection Time    02/14/13 10:30 AM      Result Value Range   Specimen Description URINE, CATHETERIZED     Special Requests NONE     Culture  Setup Time       Value: 02/14/2013 21:47     Performed at [facility] Count       Value: >=100,000 COLONIES/ML     Performed at [facility]   Culture       Value: GROUP B STREP(S.AGALACTIAE)ISOLATED     Note: TESTING AGAINST S. AGALACTIAE NOT ROUTINELY PERFORMED DUE TO PREDICTABILITY OF AMP/PEN/VAN SUSCEPTIBILITY.     Performed at [facility]   Report Status 02/16/2013 FINAL    Results for orders placed during the hospital encounter of 02/09/13 (from the past 8736 hour(s))  CBC WITH DIFFERENTIAL   Collection Time    02/09/13  7:41 PM      Result Value Range   WBC 5.7  4.0 - 10.5 K/uL   RBC 3.87  3.87 - 5.11 MIL/uL   Hemoglobin 11.2 (*)  12.0 - 15.0 g/dL   HCT 78.2  95.6 - 21.3 %   MCV 93.8  78.0 - 100.0 fL   MCH 28.9  26.0 - 34.0 pg   MCHC 30.9  30.0 - 36.0 g/dL   RDW 08.6  57.8 - 46.9 %   Platelets 211  150 - 400 K/uL   Neutrophils Relative % 72  43 - 77 %   Neutro Abs 4.1  1.7 - 7.7 K/uL   Lymphocytes Relative 18  12 - 46 %   Lymphs Abs 1.0  0.7 - 4.0 K/uL   Monocytes Relative 9  3 - 12 %   Monocytes Absolute 0.5  0.1 - 1.0 K/uL   Eosinophils Relative 1  0 - 5 %   Eosinophils Absolute 0.1  0.0 - 0.7 K/uL   Basophils Relative 0  0 - 1 %   Basophils Absolute 0.0  0.0 - 0.1 K/uL  BASIC METABOLIC PANEL   Collection Time    02/09/13  7:41 PM      Result Value Range   Sodium 137  135 - 145 mEq/L   Potassium 4.8  3.5 - 5.1 mEq/L   Chloride 101  96 - 112 mEq/L   CO2 28  19 - 32 mEq/L   Glucose, Bld 95  70 - 99 mg/dL   BUN 28 (*) 6 - 23 mg/dL   Creatinine, Ser 6.29 (*) 0.50 - 1.10 mg/dL   Calcium 52.8  8.4 - 41.3 mg/dL   GFR calc non Af Amer 29 (*) >90 mL/min   GFR calc Af Amer 34 (*) >90 mL/min  URINE CULTURE   Collection Time    02/09/13  9:11 PM      Result  Value Range   Specimen Description URINE, CLEAN CATCH     Special Requests NONE     Culture  Setup Time       Value: 02/09/2013 21:25     Performed at [facility] Count       Value: >=100,000 COLONIES/ML     Performed at [facility]   Culture       Value: Multiple bacterial morphotypes present, none predominant. Suggest appropriate recollection if clinically indicated.     Performed at [facility]   Report Status 02/11/2013 FINAL    URINALYSIS, ROUTINE W REFLEX MICROSCOPIC   Collection Time    02/09/13  9:11 PM      Result Value Range   Color, Urine YELLOW  YELLOW   APPearance CLEAR  CLEAR   Specific Gravity, Urine 1.010  1.005 - 1.030   pH 6.0  5.0 - 8.0   Glucose, UA NEGATIVE  NEGATIVE mg/dL   Hgb urine dipstick NEGATIVE  NEGATIVE   Bilirubin Urine NEGATIVE  NEGATIVE   Ketones, ur NEGATIVE  NEGATIVE mg/dL    Protein, ur NEGATIVE  NEGATIVE mg/dL   Urobilinogen, UA 0.2  0.0 - 1.0 mg/dL   Nitrite NEGATIVE  NEGATIVE   Leukocytes, UA SMALL (*) NEGATIVE  URINE MICROSCOPIC-ADD ON   Collection Time    02/09/13  9:11 PM      Result Value Range   Squamous Epithelial / LPF MANY (*) RARE   WBC, UA 7-10  <3 WBC/hpf   RBC / HPF 0-2  <3 RBC/hpf   Bacteria, UA MANY (*) RARE   Casts HYALINE CASTS (*) NEGATIVE  TSH   Collection Time    02/09/13 11:06 PM      Result Value Range   TSH 0.567  0.350 - 4.500 uIU/mL  URIC ACID   Collection Time    02/09/13 11:06 PM      Result Value Range   Uric Acid, Serum 5.7  2.4 - 7.0 mg/dL  URINE CULTURE   Collection Time    02/10/13  1:04 AM      Result Value Range   Specimen Description URINE, CATHETERIZED     Special Requests NONE     Culture  Setup Time       Value: 02/09/2013 01:20     Performed at [facility] Count       Value: 50,000 COLONIES/ML     Performed at [facility]   Culture       Value: GROUP B STREP(S.AGALACTIAE)ISOLATED     Note: TESTING AGAINST S. AGALACTIAE NOT ROUTINELY PERFORMED DUE TO PREDICTABILITY OF AMP/PEN/VAN SUSCEPTIBILITY.     Performed at [facility]   Report Status 02/11/2013 FINAL    URINALYSIS, ROUTINE W REFLEX MICROSCOPIC   Collection Time    02/10/13  1:04 AM      Result Value Range   Color, Urine YELLOW  YELLOW   APPearance CLEAR  CLEAR   Specific Gravity, Urine 1.010  1.005 - 1.030   pH 6.5  5.0 - 8.0   Glucose, UA NEGATIVE  NEGATIVE mg/dL   Hgb urine dipstick NEGATIVE  NEGATIVE   Bilirubin Urine NEGATIVE  NEGATIVE   Ketones, ur NEGATIVE  NEGATIVE mg/dL   Protein, ur NEGATIVE  NEGATIVE mg/dL   Urobilinogen, UA 0.2  0.0 - 1.0 mg/dL   Nitrite NEGATIVE  NEGATIVE   Leukocytes, UA NEGATIVE  NEGATIVE  CBC   Collection  Time    02/10/13  4:59 AM      Result Value Range   WBC 5.1  4.0 - 10.5 K/uL   RBC 3.73 (*) 3.87 - 5.11 MIL/uL   Hemoglobin 10.8 (*) 12.0 - 15.0 g/dL   HCT  16.1 (*) 09.6 - 46.0 %   MCV 93.8  78.0 - 100.0 fL   MCH 29.0  26.0 - 34.0 pg   MCHC 30.9  30.0 - 36.0 g/dL   RDW 04.5  40.9 - 81.1 %   Platelets 213  150 - 400 K/uL  COMPREHENSIVE METABOLIC PANEL   Collection Time    02/10/13  4:59 AM      Result Value Range   Sodium 141  135 - 145 mEq/L   Potassium 4.3  3.5 - 5.1 mEq/L   Chloride 108  96 - 112 mEq/L   CO2 27  19 - 32 mEq/L   Glucose, Bld 83  70 - 99 mg/dL   BUN 25 (*) 6 - 23 mg/dL   Creatinine, Ser 9.14 (*) 0.50 - 1.10 mg/dL   Calcium 9.9  8.4 - 78.2 mg/dL   Total Protein 6.3  6.0 - 8.3 g/dL   Albumin 3.0 (*) 3.5 - 5.2 g/dL   AST 16  0 - 37 U/L   ALT 7  0 - 35 U/L   Alkaline Phosphatase 113  39 - 117 U/L   Total Bilirubin 0.3  0.3 - 1.2 mg/dL   GFR calc non Af Amer 32 (*) >90 mL/min   GFR calc Af Amer 37 (*) >90 mL/min  VITAMIN B12   Collection Time    02/10/13  1:01 PM      Result Value Range   Vitamin B-12 1682 (*) 211 - 911 pg/mL  FOLATE   Collection Time    02/10/13  1:01 PM      Result Value Range   Folate >20.0    IRON AND TIBC   Collection Time    02/10/13  1:01 PM      Result Value Range   Iron 33 (*) 42 - 135 ug/dL   TIBC 956 (*) 213 - 086 ug/dL   Saturation Ratios 18 (*) 20 - 55 %   UIBC 149  125 - 400 ug/dL  FERRITIN   Collection Time    02/10/13  1:01 PM      Result Value Range   Ferritin 51  10 - 291 ng/mL  RETICULOCYTES   Collection Time    02/10/13  1:01 PM      Result Value Range   Retic Ct Pct 0.8  0.4 - 3.1 %   RBC. 3.68 (*) 3.87 - 5.11 MIL/uL   Retic Count, Manual 29.4  19.0 - 186.0 K/uL  BASIC METABOLIC PANEL   Collection Time    02/11/13  4:49 AM      Result Value Range   Sodium 143  135 - 145 mEq/L   Potassium 4.3  3.5 - 5.1 mEq/L   Chloride 110  96 - 112 mEq/L   CO2 25  19 - 32 mEq/L   Glucose, Bld 90  70 - 99 mg/dL   BUN 23  6 - 23 mg/dL   Creatinine, Ser 5.78 (*) 0.50 - 1.10 mg/dL   Calcium 9.1  8.4 - 46.9 mg/dL   GFR calc non Af Amer 36 (*) >90 mL/min   GFR calc Af Amer  42 (*) >90 mL/min  Results for orders placed during the  hospital encounter of 07/22/12 (from the past 8736 hour(s))  CBC WITH DIFFERENTIAL   Collection Time    07/22/12  4:56 PM      Result Value Range   WBC 8.0  4.0 - 10.5 K/uL   RBC 4.01  3.87 - 5.11 MIL/uL   Hemoglobin 12.3  12.0 - 15.0 g/dL   HCT 16.1  09.6 - 04.5 %   MCV 95.3  78.0 - 100.0 fL   MCH 30.7  26.0 - 34.0 pg   MCHC 32.2  30.0 - 36.0 g/dL   RDW 40.9  81.1 - 91.4 %   Platelets 189  150 - 400 K/uL   Neutrophils Relative % 82 (*) 43 - 77 %   Neutro Abs 6.5  1.7 - 7.7 K/uL   Lymphocytes Relative 10 (*) 12 - 46 %   Lymphs Abs 0.8  0.7 - 4.0 K/uL   Monocytes Relative 7  3 - 12 %   Monocytes Absolute 0.5  0.1 - 1.0 K/uL   Eosinophils Relative 1  0 - 5 %   Eosinophils Absolute 0.1  0.0 - 0.7 K/uL   Basophils Relative 0  0 - 1 %   Basophils Absolute 0.0  0.0 - 0.1 K/uL  COMPREHENSIVE METABOLIC PANEL   Collection Time    07/22/12  4:56 PM      Result Value Range   Sodium 138  135 - 145 mEq/L   Potassium 4.4  3.5 - 5.1 mEq/L   Chloride 102  96 - 112 mEq/L   CO2 28  19 - 32 mEq/L   Glucose, Bld 108 (*) 70 - 99 mg/dL   BUN 18  6 - 23 mg/dL   Creatinine, Ser 7.82 (*) 0.50 - 1.10 mg/dL   Calcium 95.6  8.4 - 21.3 mg/dL   Total Protein 6.5  6.0 - 8.3 g/dL   Albumin 3.2 (*) 3.5 - 5.2 g/dL   AST 13  0 - 37 U/L   ALT 7  0 - 35 U/L   Alkaline Phosphatase 96  39 - 117 U/L   Total Bilirubin 0.2 (*) 0.3 - 1.2 mg/dL   GFR calc non Af Amer 37 (*) >90 mL/min   GFR calc Af Amer 43 (*) >90 mL/min  URINE CULTURE   Collection Time    07/22/12  5:51 PM      Result Value Range   Specimen Description URINE, CATHETERIZED     Special Requests NONE     Culture  Setup Time 07/22/2012 18:35     Colony Count NO GROWTH     Culture NO GROWTH     Report Status 07/23/2012 FINAL    URINALYSIS, ROUTINE W REFLEX MICROSCOPIC   Collection Time    07/22/12  5:51 PM      Result Value Range   Color, Urine YELLOW  YELLOW   APPearance CLEAR   CLEAR   Specific Gravity, Urine 1.010  1.005 - 1.030   pH 6.0  5.0 - 8.0   Glucose, UA NEGATIVE  NEGATIVE mg/dL   Hgb urine dipstick NEGATIVE  NEGATIVE   Bilirubin Urine NEGATIVE  NEGATIVE   Ketones, ur NEGATIVE  NEGATIVE mg/dL   Protein, ur NEGATIVE  NEGATIVE mg/dL   Urobilinogen, UA 0.2  0.0 - 1.0 mg/dL   Nitrite NEGATIVE  NEGATIVE   Leukocytes, UA SMALL (*) NEGATIVE  URINE MICROSCOPIC-ADD ON   Collection Time    07/22/12  5:51 PM      Result Value  Range   WBC, UA 3-6  <3 WBC/hpf   RBC / HPF 0-2  <3 RBC/hpf   Bacteria, UA MANY (*) RARE  Results for orders placed in visit on 07/02/12 (from the past 8736 hour(s))  COMPREHENSIVE METABOLIC PANEL   Collection Time    07/02/12  1:45 PM      Result Value Range   Sodium 138  135 - 145 mEq/L   Potassium 4.6  3.5 - 5.3 mEq/L   Chloride 104  96 - 112 mEq/L   CO2 28  19 - 32 mEq/L   Glucose, Bld 138 (*) 70 - 99 mg/dL   BUN 27 (*) 6 - 23 mg/dL   Creat 2.95 (*) 2.84 - 1.10 mg/dL   Total Bilirubin 0.4  0.3 - 1.2 mg/dL   Alkaline Phosphatase 86  39 - 117 U/L   AST 17  0 - 37 U/L   ALT <8  0 - 35 U/L   Total Protein 6.3  6.0 - 8.3 g/dL   Albumin 3.7  3.5 - 5.2 g/dL   Calcium 9.7  8.4 - 13.2 mg/dL  CBC WITH DIFFERENTIAL   Collection Time    07/02/12  1:45 PM      Result Value Range   WBC 7.3  4.0 - 10.5 K/uL   RBC 3.87  3.87 - 5.11 MIL/uL   Hemoglobin 11.5 (*) 12.0 - 15.0 g/dL   HCT 44.0 (*) 10.2 - 72.5 %   MCV 92.2  78.0 - 100.0 fL   MCH 29.7  26.0 - 34.0 pg   MCHC 32.2  30.0 - 36.0 g/dL   RDW 36.6  44.0 - 34.7 %   Platelets 250  150 - 400 K/uL   Neutrophils Relative % 81 (*) 43 - 77 %   Neutro Abs 5.9  1.7 - 7.7 K/uL   Lymphocytes Relative 11 (*) 12 - 46 %   Lymphs Abs 0.8  0.7 - 4.0 K/uL   Monocytes Relative 7  3 - 12 %   Monocytes Absolute 0.5  0.1 - 1.0 K/uL   Eosinophils Relative 1  0 - 5 %   Eosinophils Absolute 0.1  0.0 - 0.7 K/uL   Basophils Relative 0  0 - 1 %   Basophils Absolute 0.0  0.0 - 0.1 K/uL   Smear  Review Criteria for review not met    HEMOGLOBIN A1C   Collection Time    07/02/12  1:45 PM      Result Value Range   Hemoglobin A1C 5.5  <5.7 %   Mean Plasma Glucose 111  <117 mg/dL   she had labs done last week with her primary care physician we will get the result Diagnoses 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 She is still depressed and somewhat homesick. I increased her Wellbutrin from 75-150 mg in the XL form She'll keep her other medications the same for now and return to see me in four-week's Recommend if she does not see any improvement in call us back immediately.  Discussed the risk and benefits of the medication in detail.    Time spent 25 minutes.  More than 50% of the time spent and psychoeducation, counseling and coordination of care.  MEDICATIONS this encounter: Meds ordered this encounter  Medications  . buPROPion (WELLBUTRIN XL) 150 MG 24 hr tablet    Sig: Take 1 tablet (150 mg total) by mouth every morning.    Dispense:  30 tablet    Refill:  2    Medical Decision Making Problem Points:  Established problem, stable/improving (1), Established problem, worsening (2), New problem, with additional work-up planned (4), Review of last therapy session (1) and Review of psycho-social stressors (1) Data Points:  Review of medication regiment & side effects (2) Review of new medications or change in dosage (2)  I certify that outpatient services furnished can reasonably be expected to improve the patient's condition.   Diannia Ruder, MD

## 2013-03-17 DIAGNOSIS — M159 Polyosteoarthritis, unspecified: Secondary | ICD-10-CM | POA: Diagnosis not present

## 2013-03-17 DIAGNOSIS — L89309 Pressure ulcer of unspecified buttock, unspecified stage: Secondary | ICD-10-CM | POA: Diagnosis not present

## 2013-03-17 DIAGNOSIS — F329 Major depressive disorder, single episode, unspecified: Secondary | ICD-10-CM | POA: Diagnosis not present

## 2013-03-17 DIAGNOSIS — I129 Hypertensive chronic kidney disease with stage 1 through stage 4 chronic kidney disease, or unspecified chronic kidney disease: Secondary | ICD-10-CM | POA: Diagnosis not present

## 2013-03-17 DIAGNOSIS — F319 Bipolar disorder, unspecified: Secondary | ICD-10-CM | POA: Diagnosis not present

## 2013-03-17 DIAGNOSIS — L8992 Pressure ulcer of unspecified site, stage 2: Secondary | ICD-10-CM | POA: Diagnosis not present

## 2013-03-18 DIAGNOSIS — L89309 Pressure ulcer of unspecified buttock, unspecified stage: Secondary | ICD-10-CM | POA: Diagnosis not present

## 2013-03-18 DIAGNOSIS — F319 Bipolar disorder, unspecified: Secondary | ICD-10-CM | POA: Diagnosis not present

## 2013-03-18 DIAGNOSIS — F329 Major depressive disorder, single episode, unspecified: Secondary | ICD-10-CM | POA: Diagnosis not present

## 2013-03-18 DIAGNOSIS — M159 Polyosteoarthritis, unspecified: Secondary | ICD-10-CM | POA: Diagnosis not present

## 2013-03-18 DIAGNOSIS — L8992 Pressure ulcer of unspecified site, stage 2: Secondary | ICD-10-CM | POA: Diagnosis not present

## 2013-03-18 DIAGNOSIS — I129 Hypertensive chronic kidney disease with stage 1 through stage 4 chronic kidney disease, or unspecified chronic kidney disease: Secondary | ICD-10-CM | POA: Diagnosis not present

## 2013-03-22 DIAGNOSIS — M159 Polyosteoarthritis, unspecified: Secondary | ICD-10-CM | POA: Diagnosis not present

## 2013-03-22 DIAGNOSIS — L8992 Pressure ulcer of unspecified site, stage 2: Secondary | ICD-10-CM | POA: Diagnosis not present

## 2013-03-22 DIAGNOSIS — I129 Hypertensive chronic kidney disease with stage 1 through stage 4 chronic kidney disease, or unspecified chronic kidney disease: Secondary | ICD-10-CM | POA: Diagnosis not present

## 2013-03-22 DIAGNOSIS — F329 Major depressive disorder, single episode, unspecified: Secondary | ICD-10-CM | POA: Diagnosis not present

## 2013-03-22 DIAGNOSIS — F319 Bipolar disorder, unspecified: Secondary | ICD-10-CM | POA: Diagnosis not present

## 2013-03-22 DIAGNOSIS — L89309 Pressure ulcer of unspecified buttock, unspecified stage: Secondary | ICD-10-CM | POA: Diagnosis not present

## 2013-03-25 DIAGNOSIS — M79609 Pain in unspecified limb: Secondary | ICD-10-CM | POA: Diagnosis not present

## 2013-03-25 DIAGNOSIS — F329 Major depressive disorder, single episode, unspecified: Secondary | ICD-10-CM | POA: Diagnosis not present

## 2013-03-25 DIAGNOSIS — M159 Polyosteoarthritis, unspecified: Secondary | ICD-10-CM | POA: Diagnosis not present

## 2013-03-25 DIAGNOSIS — B351 Tinea unguium: Secondary | ICD-10-CM | POA: Diagnosis not present

## 2013-03-25 DIAGNOSIS — L89309 Pressure ulcer of unspecified buttock, unspecified stage: Secondary | ICD-10-CM | POA: Diagnosis not present

## 2013-03-25 DIAGNOSIS — F319 Bipolar disorder, unspecified: Secondary | ICD-10-CM | POA: Diagnosis not present

## 2013-03-25 DIAGNOSIS — I129 Hypertensive chronic kidney disease with stage 1 through stage 4 chronic kidney disease, or unspecified chronic kidney disease: Secondary | ICD-10-CM | POA: Diagnosis not present

## 2013-03-25 DIAGNOSIS — L8992 Pressure ulcer of unspecified site, stage 2: Secondary | ICD-10-CM | POA: Diagnosis not present

## 2013-03-29 ENCOUNTER — Telehealth (HOSPITAL_COMMUNITY): Payer: Self-pay

## 2013-04-05 DIAGNOSIS — I129 Hypertensive chronic kidney disease with stage 1 through stage 4 chronic kidney disease, or unspecified chronic kidney disease: Secondary | ICD-10-CM | POA: Diagnosis not present

## 2013-04-05 DIAGNOSIS — F329 Major depressive disorder, single episode, unspecified: Secondary | ICD-10-CM | POA: Diagnosis not present

## 2013-04-05 DIAGNOSIS — M159 Polyosteoarthritis, unspecified: Secondary | ICD-10-CM | POA: Diagnosis not present

## 2013-04-05 DIAGNOSIS — L8992 Pressure ulcer of unspecified site, stage 2: Secondary | ICD-10-CM | POA: Diagnosis not present

## 2013-04-05 DIAGNOSIS — F319 Bipolar disorder, unspecified: Secondary | ICD-10-CM | POA: Diagnosis not present

## 2013-04-05 DIAGNOSIS — L89309 Pressure ulcer of unspecified buttock, unspecified stage: Secondary | ICD-10-CM | POA: Diagnosis not present

## 2013-04-06 DIAGNOSIS — M159 Polyosteoarthritis, unspecified: Secondary | ICD-10-CM | POA: Diagnosis not present

## 2013-04-06 DIAGNOSIS — M25569 Pain in unspecified knee: Secondary | ICD-10-CM | POA: Diagnosis not present

## 2013-04-06 DIAGNOSIS — Z6832 Body mass index (BMI) 32.0-32.9, adult: Secondary | ICD-10-CM | POA: Diagnosis not present

## 2013-04-13 ENCOUNTER — Ambulatory Visit (INDEPENDENT_AMBULATORY_CARE_PROVIDER_SITE_OTHER): Payer: Medicare Other | Admitting: Psychiatry

## 2013-04-13 ENCOUNTER — Encounter (HOSPITAL_COMMUNITY): Payer: Self-pay | Admitting: Psychiatry

## 2013-04-13 VITALS — BP 120/80 | Ht 66.0 in | Wt 209.0 lb

## 2013-04-13 DIAGNOSIS — F323 Major depressive disorder, single episode, severe with psychotic features: Secondary | ICD-10-CM

## 2013-04-13 DIAGNOSIS — F09 Unspecified mental disorder due to known physiological condition: Secondary | ICD-10-CM

## 2013-04-13 DIAGNOSIS — F5105 Insomnia due to other mental disorder: Secondary | ICD-10-CM

## 2013-04-13 DIAGNOSIS — F331 Major depressive disorder, recurrent, moderate: Secondary | ICD-10-CM

## 2013-04-13 DIAGNOSIS — F1999 Other psychoactive substance use, unspecified with unspecified psychoactive substance-induced disorder: Secondary | ICD-10-CM

## 2013-04-13 MED ORDER — RISPERIDONE 0.25 MG PO TABS: ORAL_TABLET | ORAL | Status: DC

## 2013-04-13 MED ORDER — PAROXETINE HCL 40 MG PO TABS: 40.0000 mg | ORAL_TABLET | ORAL | Status: DC

## 2013-04-13 MED ORDER — BENZTROPINE MESYLATE 0.5 MG PO TABS: 0.5000 mg | ORAL_TABLET | Freq: Two times a day (BID) | ORAL | Status: DC

## 2013-04-13 MED ORDER — RISPERIDONE 1 MG PO TABS: 1.0000 mg | ORAL_TABLET | Freq: Every day | ORAL | Status: DC

## 2013-04-13 NOTE — Progress Notes (Signed)
Patient ID: ETHELEAN COLLA, female   DOB: 1941/03/08, 72 y.o.   MRN: [id number] Patient ID: ANIAYA BACHA, female   DOB: 14-Oct-1940, 72 y.o.   MRN: [id number] Patient ID: KEILYN HAGGARD, female   DOB: Nov 20, 1940, 72 y.o.   MRN: [id number] [facility] [address] Progress Note KHARI LETT MRN: [id number] DOB: 03/14/1941 Age: 72 y.o.  Date: 04/13/2013  Chief Complaint  Patient presents with  . Anxiety  . Depression  . Follow-up   History of presenting illness Patient is 72 year old Caucasian female who came with her sister for her followup appointment. She and her sister live together in the family farm outside of [place]. Neither one has ever been married or have had children. The patient worked until the early 80s when she began to have difficulties with her nerves.  Currently the patient is still struggling with her mental and physical health. She was admitted last year to a psychiatric hospital in [place] because she was anxious nervous and depressed. Last time Dr. Lolly Mustache increased her Risperdal which is helped somewhat. She can't walk and it's not clear why much of it seems to be anxiety based and she seems to have a fear of falling. She's also recently developed possible renal failure and is can be referred to a nephrologist. She was on lithium for a number of years. We do not have her leak recent records from her primary care physician who is outside of [place].  Currently the patient is still depressed despite being on 60 mg of Paxil a day. She has no energy. She sits and watches TV all the time and worries constantly. She's not suicidal but her mood is low. Her memory has been failing her and she gets confused in the afternoons and agitated. A recent brain CT indicated a atrophy and significant white matter disease which is consistent with early dementia.  The patient is here after one month. Her sister and an aide accompany her. She is doing a little bit  better. Her spirits have brightened since she got on a higher dose of Wellbutrin. The staff at the rest home have noticed that she gets confused in the early evening. Her sisters notices it as well. I think we can add a small dose of Risperdal around dinnertime to help with this. She is walking better but mania and meniscus surgery in her left knee.  Past psychiatric history Patient is seeing psychiatrist since 1997 in this office.  Her last psychiatric admission was at [facility] after having suicidal thoughts.  She denies any history of suicidal attempt however endorse history of hopeless feeling.  She is diagnosed with major depressive disorder. She she was very stable on lithium until her creatinine went up and lithium was discontinued.  We had recently tried Lamictal and the dose has been increased recently.  In the past we had tried BuSpar which was discontinued at hospital.  She has another psychiatric inpatient treatment in 90s. She admitted history of mania, depression and psychosis.  Social history Patient lives with her sister.  She has no children.  Her husband died in nursing home.  Patient has no other family.  Medical history Patient was recently admitted due to dehydration and UTI.  Patient has history of arthritis blood pressure and chronic pain. Her primary care physician is Dr. Phillips Odor at [facility].  She takes medication for her blood pressure and arthritis.  She had history of jerking movements, fall and generalized pain.  Her last blood work was done on 02/14/2012 which shows anemia, her creatinine was 1.33.  She was given antibiotic result UTI.  Her WBC count was 10.4.  Family History family history includes Anxiety disorder in her paternal aunt and paternal uncle; Arthritis in an other family member; Asthma in an other family member; Bipolar disorder in her cousin; Diabetes in an other family member. There is no history of Colon cancer.  Review  of Systems  Constitutional: Positive for malaise/fatigue.  Skin: Negative.   Neurological: Positive for tremors, weakness and headaches.  Psychiatric/Behavioral: Positive for memory loss. Negative for suicidal ideas, hallucinations and substance abuse. The patient is nervous/anxious. The patient does not have insomnia.      Mental status examination Patient is casually dressed and fairly groomed.  She is in better spirits today. She smiled been told me about helping her sister cook Thanksgiving dinner. She is allowed to go out on passes. She is less obsessional about wanting to go home..  Her voice is high-pitched and soft. .Her affect is constricted.  Her attention and concentration is poor.  She has poverty of thought content.  She doesn't auditory or visual hallucination.  There were no paranoia or delusion obsession present at this time.  Her fund of knowledge is okay.  There are no tremors and shakes in her hand.  She's alert and oriented x3.  Her insight judgment and impulse control is okay.  Lab Results:  Results for orders placed during the hospital encounter of 02/14/13 (from the past 8736 hour(s))  CBC   Collection Time    02/14/13 10:07 AM      Result Value Range   WBC 10.1  4.0 - 10.5 K/uL   RBC 3.73 (*) 3.87 - 5.11 MIL/uL   Hemoglobin 11.0 (*) 12.0 - 15.0 g/dL   HCT 04.5 (*) 40.9 - 81.1 %   MCV 93.8  78.0 - 100.0 fL   MCH 29.5  26.0 - 34.0 pg   MCHC 31.4  30.0 - 36.0 g/dL   RDW 91.4  78.2 - 95.6 %   Platelets 204  150 - 400 K/uL  BASIC METABOLIC PANEL   Collection Time    02/14/13 10:07 AM      Result Value Range   Sodium 140  135 - 145 mEq/L   Potassium 4.2  3.5 - 5.1 mEq/L   Chloride 102  96 - 112 mEq/L   CO2 27  19 - 32 mEq/L   Glucose, Bld 146 (*) 70 - 99 mg/dL   BUN 22  6 - 23 mg/dL   Creatinine, Ser 2.13 (*) 0.50 - 1.10 mg/dL   Calcium 08.6  8.4 - 57.8 mg/dL   GFR calc non Af Amer 31 (*) >90 mL/min   GFR calc Af Amer 36 (*) >90 mL/min  URINALYSIS, ROUTINE W  REFLEX MICROSCOPIC   Collection Time    02/14/13 10:20 AM      Result Value Range   Color, Urine YELLOW  YELLOW   APPearance CLEAR  CLEAR   Specific Gravity, Urine 1.015  1.005 - 1.030   pH 6.5  5.0 - 8.0   Glucose, UA NEGATIVE  NEGATIVE mg/dL   Hgb urine dipstick MODERATE (*) NEGATIVE   Bilirubin Urine NEGATIVE  NEGATIVE   Ketones, ur NEGATIVE  NEGATIVE mg/dL   Protein, ur 30 (*) NEGATIVE mg/dL   Urobilinogen, UA 0.2  0.0 - 1.0 mg/dL   Nitrite NEGATIVE  NEGATIVE   Leukocytes, UA LARGE (*)  NEGATIVE  URINE MICROSCOPIC-ADD ON   Collection Time    02/14/13 10:20 AM      Result Value Range   WBC, UA TOO NUMEROUS TO COUNT  <3 WBC/hpf   RBC / HPF TOO NUMEROUS TO COUNT  <3 RBC/hpf   Bacteria, UA MANY (*) RARE  URINE CULTURE   Collection Time    02/14/13 10:30 AM      Result Value Range   Specimen Description URINE, CATHETERIZED     Special Requests NONE     Culture  Setup Time       Value: 02/14/2013 21:47     Performed at [facility] Count       Value: >=100,000 COLONIES/ML     Performed at [facility]   Culture       Value: GROUP B STREP(S.AGALACTIAE)ISOLATED     Note: TESTING AGAINST S. AGALACTIAE NOT ROUTINELY PERFORMED DUE TO PREDICTABILITY OF AMP/PEN/VAN SUSCEPTIBILITY.     Performed at [facility]   Report Status 02/16/2013 FINAL    Results for orders placed during the hospital encounter of 02/09/13 (from the past 8736 hour(s))  CBC WITH DIFFERENTIAL   Collection Time    02/09/13  7:41 PM      Result Value Range   WBC 5.7  4.0 - 10.5 K/uL   RBC 3.87  3.87 - 5.11 MIL/uL   Hemoglobin 11.2 (*) 12.0 - 15.0 g/dL   HCT 16.1  09.6 - 04.5 %   MCV 93.8  78.0 - 100.0 fL   MCH 28.9  26.0 - 34.0 pg   MCHC 30.9  30.0 - 36.0 g/dL   RDW 40.9  81.1 - 91.4 %   Platelets 211  150 - 400 K/uL   Neutrophils Relative % 72  43 - 77 %   Neutro Abs 4.1  1.7 - 7.7 K/uL   Lymphocytes Relative 18  12 - 46 %   Lymphs Abs 1.0  0.7 - 4.0 K/uL   Monocytes  Relative 9  3 - 12 %   Monocytes Absolute 0.5  0.1 - 1.0 K/uL   Eosinophils Relative 1  0 - 5 %   Eosinophils Absolute 0.1  0.0 - 0.7 K/uL   Basophils Relative 0  0 - 1 %   Basophils Absolute 0.0  0.0 - 0.1 K/uL  BASIC METABOLIC PANEL   Collection Time    02/09/13  7:41 PM      Result Value Range   Sodium 137  135 - 145 mEq/L   Potassium 4.8  3.5 - 5.1 mEq/L   Chloride 101  96 - 112 mEq/L   CO2 28  19 - 32 mEq/L   Glucose, Bld 95  70 - 99 mg/dL   BUN 28 (*) 6 - 23 mg/dL   Creatinine, Ser 7.82 (*) 0.50 - 1.10 mg/dL   Calcium 95.6  8.4 - 21.3 mg/dL   GFR calc non Af Amer 29 (*) >90 mL/min   GFR calc Af Amer 34 (*) >90 mL/min  URINE CULTURE   Collection Time    02/09/13  9:11 PM      Result Value Range   Specimen Description URINE, CLEAN CATCH     Special Requests NONE     Culture  Setup Time       Value: 02/09/2013 21:25     Performed at [facility] Count       Value: >=100,000 COLONIES/ML  Performed at [facility]       Value: Multiple bacterial morphotypes present, none predominant. Suggest appropriate recollection if clinically indicated.     Performed at [facility]   Report Status 02/11/2013 FINAL    URINALYSIS, ROUTINE W REFLEX MICROSCOPIC   Collection Time    02/09/13  9:11 PM      Result Value Range   Color, Urine YELLOW  YELLOW   APPearance CLEAR  CLEAR   Specific Gravity, Urine 1.010  1.005 - 1.030   pH 6.0  5.0 - 8.0   Glucose, UA NEGATIVE  NEGATIVE mg/dL   Hgb urine dipstick NEGATIVE  NEGATIVE   Bilirubin Urine NEGATIVE  NEGATIVE   Ketones, ur NEGATIVE  NEGATIVE mg/dL   Protein, ur NEGATIVE  NEGATIVE mg/dL   Urobilinogen, UA 0.2  0.0 - 1.0 mg/dL   Nitrite NEGATIVE  NEGATIVE   Leukocytes, UA SMALL (*) NEGATIVE  URINE MICROSCOPIC-ADD ON   Collection Time    02/09/13  9:11 PM      Result Value Range   Squamous Epithelial / LPF MANY (*) RARE   WBC, UA 7-10  <3 WBC/hpf   RBC / HPF 0-2  <3 RBC/hpf    Bacteria, UA MANY (*) RARE   Casts HYALINE CASTS (*) NEGATIVE  TSH   Collection Time    02/09/13 11:06 PM      Result Value Range   TSH 0.567  0.350 - 4.500 uIU/mL  URIC ACID   Collection Time    02/09/13 11:06 PM      Result Value Range   Uric Acid, Serum 5.7  2.4 - 7.0 mg/dL  URINE CULTURE   Collection Time    02/10/13  1:04 AM      Result Value Range   Specimen Description URINE, CATHETERIZED     Special Requests NONE     Culture  Setup Time       Value: 02/09/2013 01:20     Performed at [facility] Count       Value: 50,000 COLONIES/ML     Performed at [facility]   Culture       Value: GROUP B STREP(S.AGALACTIAE)ISOLATED     Note: TESTING AGAINST S. AGALACTIAE NOT ROUTINELY PERFORMED DUE TO PREDICTABILITY OF AMP/PEN/VAN SUSCEPTIBILITY.     Performed at [facility]   Report Status 02/11/2013 FINAL    URINALYSIS, ROUTINE W REFLEX MICROSCOPIC   Collection Time    02/10/13  1:04 AM      Result Value Range   Color, Urine YELLOW  YELLOW   APPearance CLEAR  CLEAR   Specific Gravity, Urine 1.010  1.005 - 1.030   pH 6.5  5.0 - 8.0   Glucose, UA NEGATIVE  NEGATIVE mg/dL   Hgb urine dipstick NEGATIVE  NEGATIVE   Bilirubin Urine NEGATIVE  NEGATIVE   Ketones, ur NEGATIVE  NEGATIVE mg/dL   Protein, ur NEGATIVE  NEGATIVE mg/dL   Urobilinogen, UA 0.2  0.0 - 1.0 mg/dL   Nitrite NEGATIVE  NEGATIVE   Leukocytes, UA NEGATIVE  NEGATIVE  CBC   Collection Time    02/10/13  4:59 AM      Result Value Range   WBC 5.1  4.0 - 10.5 K/uL   RBC 3.73 (*) 3.87 - 5.11 MIL/uL   Hemoglobin 10.8 (*) 12.0 - 15.0 g/dL   HCT 96.0 (*) 45.4 - 09.8 %   MCV 93.8  78.0 -  100.0 fL   MCH 29.0  26.0 - 34.0 pg   MCHC 30.9  30.0 - 36.0 g/dL   RDW 16.1  09.6 - 04.5 %   Platelets 213  150 - 400 K/uL  COMPREHENSIVE METABOLIC PANEL   Collection Time    02/10/13  4:59 AM      Result Value Range   Sodium 141  135 - 145 mEq/L   Potassium 4.3  3.5 - 5.1 mEq/L   Chloride  108  96 - 112 mEq/L   CO2 27  19 - 32 mEq/L   Glucose, Bld 83  70 - 99 mg/dL   BUN 25 (*) 6 - 23 mg/dL   Creatinine, Ser 4.09 (*) 0.50 - 1.10 mg/dL   Calcium 9.9  8.4 - 81.1 mg/dL   Total Protein 6.3  6.0 - 8.3 g/dL   Albumin 3.0 (*) 3.5 - 5.2 g/dL   AST 16  0 - 37 U/L   ALT 7  0 - 35 U/L   Alkaline Phosphatase 113  39 - 117 U/L   Total Bilirubin 0.3  0.3 - 1.2 mg/dL   GFR calc non Af Amer 32 (*) >90 mL/min   GFR calc Af Amer 37 (*) >90 mL/min  VITAMIN B12   Collection Time    02/10/13  1:01 PM      Result Value Range   Vitamin B-12 1682 (*) 211 - 911 pg/mL  FOLATE   Collection Time    02/10/13  1:01 PM      Result Value Range   Folate >20.0    IRON AND TIBC   Collection Time    02/10/13  1:01 PM      Result Value Range   Iron 33 (*) 42 - 135 ug/dL   TIBC 914 (*) 782 - 956 ug/dL   Saturation Ratios 18 (*) 20 - 55 %   UIBC 149  125 - 400 ug/dL  FERRITIN   Collection Time    02/10/13  1:01 PM      Result Value Range   Ferritin 51  10 - 291 ng/mL  RETICULOCYTES   Collection Time    02/10/13  1:01 PM      Result Value Range   Retic Ct Pct 0.8  0.4 - 3.1 %   RBC. 3.68 (*) 3.87 - 5.11 MIL/uL   Retic Count, Manual 29.4  19.0 - 186.0 K/uL  BASIC METABOLIC PANEL   Collection Time    02/11/13  4:49 AM      Result Value Range   Sodium 143  135 - 145 mEq/L   Potassium 4.3  3.5 - 5.1 mEq/L   Chloride 110  96 - 112 mEq/L   CO2 25  19 - 32 mEq/L   Glucose, Bld 90  70 - 99 mg/dL   BUN 23  6 - 23 mg/dL   Creatinine, Ser 2.13 (*) 0.50 - 1.10 mg/dL   Calcium 9.1  8.4 - 08.6 mg/dL   GFR calc non Af Amer 36 (*) >90 mL/min   GFR calc Af Amer 42 (*) >90 mL/min  Results for orders placed during the hospital encounter of 07/22/12 (from the past 8736 hour(s))  CBC WITH DIFFERENTIAL   Collection Time    07/22/12  4:56 PM      Result Value Range   WBC 8.0  4.0 - 10.5 K/uL   RBC 4.01  3.87 - 5.11 MIL/uL   Hemoglobin 12.3  12.0 - 15.0 g/dL  HCT 38.2  36.0 - 46.0 %   MCV 95.3   78.0 - 100.0 fL   MCH 30.7  26.0 - 34.0 pg   MCHC 32.2  30.0 - 36.0 g/dL   RDW 60.4  54.0 - 98.1 %   Platelets 189  150 - 400 K/uL   Neutrophils Relative % 82 (*) 43 - 77 %   Neutro Abs 6.5  1.7 - 7.7 K/uL   Lymphocytes Relative 10 (*) 12 - 46 %   Lymphs Abs 0.8  0.7 - 4.0 K/uL   Monocytes Relative 7  3 - 12 %   Monocytes Absolute 0.5  0.1 - 1.0 K/uL   Eosinophils Relative 1  0 - 5 %   Eosinophils Absolute 0.1  0.0 - 0.7 K/uL   Basophils Relative 0  0 - 1 %   Basophils Absolute 0.0  0.0 - 0.1 K/uL  COMPREHENSIVE METABOLIC PANEL   Collection Time    07/22/12  4:56 PM      Result Value Range   Sodium 138  135 - 145 mEq/L   Potassium 4.4  3.5 - 5.1 mEq/L   Chloride 102  96 - 112 mEq/L   CO2 28  19 - 32 mEq/L   Glucose, Bld 108 (*) 70 - 99 mg/dL   BUN 18  6 - 23 mg/dL   Creatinine, Ser 1.91 (*) 0.50 - 1.10 mg/dL   Calcium 47.8  8.4 - 29.5 mg/dL   Total Protein 6.5  6.0 - 8.3 g/dL   Albumin 3.2 (*) 3.5 - 5.2 g/dL   AST 13  0 - 37 U/L   ALT 7  0 - 35 U/L   Alkaline Phosphatase 96  39 - 117 U/L   Total Bilirubin 0.2 (*) 0.3 - 1.2 mg/dL   GFR calc non Af Amer 37 (*) >90 mL/min   GFR calc Af Amer 43 (*) >90 mL/min  URINE CULTURE   Collection Time    07/22/12  5:51 PM      Result Value Range   Specimen Description URINE, CATHETERIZED     Special Requests NONE     Culture  Setup Time 07/22/2012 18:35     Colony Count NO GROWTH     Culture NO GROWTH     Report Status 07/23/2012 FINAL    URINALYSIS, ROUTINE W REFLEX MICROSCOPIC   Collection Time    07/22/12  5:51 PM      Result Value Range   Color, Urine YELLOW  YELLOW   APPearance CLEAR  CLEAR   Specific Gravity, Urine 1.010  1.005 - 1.030   pH 6.0  5.0 - 8.0   Glucose, UA NEGATIVE  NEGATIVE mg/dL   Hgb urine dipstick NEGATIVE  NEGATIVE   Bilirubin Urine NEGATIVE  NEGATIVE   Ketones, ur NEGATIVE  NEGATIVE mg/dL   Protein, ur NEGATIVE  NEGATIVE mg/dL   Urobilinogen, UA 0.2  0.0 - 1.0 mg/dL   Nitrite NEGATIVE  NEGATIVE    Leukocytes, UA SMALL (*) NEGATIVE  URINE MICROSCOPIC-ADD ON   Collection Time    07/22/12  5:51 PM      Result Value Range   WBC, UA 3-6  <3 WBC/hpf   RBC / HPF 0-2  <3 RBC/hpf   Bacteria, UA MANY (*) RARE  Results for orders placed in visit on 07/02/12 (from the past 8736 hour(s))  COMPREHENSIVE METABOLIC PANEL   Collection Time    07/02/12  1:45 PM      Result  Value Range   Sodium 138  135 - 145 mEq/L   Potassium 4.6  3.5 - 5.3 mEq/L   Chloride 104  96 - 112 mEq/L   CO2 28  19 - 32 mEq/L   Glucose, Bld 138 (*) 70 - 99 mg/dL   BUN 27 (*) 6 - 23 mg/dL   Creat 1.61 (*) 0.96 - 1.10 mg/dL   Total Bilirubin 0.4  0.3 - 1.2 mg/dL   Alkaline Phosphatase 86  39 - 117 U/L   AST 17  0 - 37 U/L   ALT <8  0 - 35 U/L   Total Protein 6.3  6.0 - 8.3 g/dL   Albumin 3.7  3.5 - 5.2 g/dL   Calcium 9.7  8.4 - 04.5 mg/dL  CBC WITH DIFFERENTIAL   Collection Time    07/02/12  1:45 PM      Result Value Range   WBC 7.3  4.0 - 10.5 K/uL   RBC 3.87  3.87 - 5.11 MIL/uL   Hemoglobin 11.5 (*) 12.0 - 15.0 g/dL   HCT 40.9 (*) 81.1 - 91.4 %   MCV 92.2  78.0 - 100.0 fL   MCH 29.7  26.0 - 34.0 pg   MCHC 32.2  30.0 - 36.0 g/dL   RDW 78.2  95.6 - 21.3 %   Platelets 250  150 - 400 K/uL   Neutrophils Relative % 81 (*) 43 - 77 %   Neutro Abs 5.9  1.7 - 7.7 K/uL   Lymphocytes Relative 11 (*) 12 - 46 %   Lymphs Abs 0.8  0.7 - 4.0 K/uL   Monocytes Relative 7  3 - 12 %   Monocytes Absolute 0.5  0.1 - 1.0 K/uL   Eosinophils Relative 1  0 - 5 %   Eosinophils Absolute 0.1  0.0 - 0.7 K/uL   Basophils Relative 0  0 - 1 %   Basophils Absolute 0.0  0.0 - 0.1 K/uL   Smear Review Criteria for review not met    HEMOGLOBIN A1C   Collection Time    07/02/12  1:45 PM      Result Value Range   Hemoglobin A1C 5.5  <5.7 %   Mean Plasma Glucose 111  <117 mg/dL   she had labs done last week with her primary care physician we will get the result Diagnoses 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 Her mood is improve but she's having some sundowning around dinnertime She'll keep her medications the same but add an additional Risperdal 0.25 mg at dinnertime and return to see me in 2 months Recommend if she does not see any improvement in call us back immediately.  Discussed the risk and benefits of the medication in detail.    Time spent 25 minutes.  More than 50% of the time spent and psychoeducation, counseling and coordination of care.  MEDICATIONS this encounter: Meds ordered this encounter  Medications  . risperiDONE (RISPERDAL) 0.25 MG tablet    Sig: Take one tid, 8 am, 2-pm and 5 pm    Dispense:  90 tablet    Refill:  3  . risperiDONE (RISPERDAL) 1 MG tablet    Sig: Take 1 tablet (1 mg total) by mouth at bedtime.    Dispense:  30 tablet    Refill:  2  . PARoxetine (PAXIL) 40 MG tablet    Sig: Take 1 tablet (40 mg total) by mouth every morning.    Dispense:  30 tablet    Refill:  1  . benztropine (COGENTIN) 0.5 MG tablet    Sig: Take 1 tablet (0.5 mg total) by mouth 2 (two) times daily.    Dispense:  60 tablet    Refill:  1    Medical Decision Making Problem Points:  Established problem, stable/improving (1), Established problem, worsening (2), New problem, with additional work-up planned (4), Review of last therapy session (1) and Review of psycho-social stressors (1) Data Points:  Review of medication regiment & side effects (2) Review of new medications or change in dosage (2)  I certify that outpatient services furnished can reasonably be expected to improve the patient's condition.   Diannia Ruder, MD

## 2013-04-19 ENCOUNTER — Telehealth (HOSPITAL_COMMUNITY): Payer: Self-pay | Admitting: Psychiatry

## 2013-04-27 DIAGNOSIS — N390 Urinary tract infection, site not specified: Secondary | ICD-10-CM | POA: Diagnosis not present

## 2013-04-27 DIAGNOSIS — R3919 Other difficulties with micturition: Secondary | ICD-10-CM | POA: Diagnosis not present

## 2013-04-27 DIAGNOSIS — Z6834 Body mass index (BMI) 34.0-34.9, adult: Secondary | ICD-10-CM | POA: Diagnosis not present

## 2013-06-15 ENCOUNTER — Telehealth (HOSPITAL_COMMUNITY): Payer: Self-pay | Admitting: *Deleted

## 2013-06-15 ENCOUNTER — Encounter (HOSPITAL_COMMUNITY): Payer: Self-pay | Admitting: Psychiatry

## 2013-06-15 ENCOUNTER — Ambulatory Visit (INDEPENDENT_AMBULATORY_CARE_PROVIDER_SITE_OTHER): Payer: Medicare Other | Admitting: Psychiatry

## 2013-06-15 VITALS — BP 150/80 | Ht 66.0 in | Wt 223.0 lb

## 2013-06-15 DIAGNOSIS — F331 Major depressive disorder, recurrent, moderate: Secondary | ICD-10-CM

## 2013-06-15 DIAGNOSIS — F29 Unspecified psychosis not due to a substance or known physiological condition: Secondary | ICD-10-CM

## 2013-06-15 DIAGNOSIS — F3289 Other specified depressive episodes: Secondary | ICD-10-CM

## 2013-06-15 DIAGNOSIS — F1999 Other psychoactive substance use, unspecified with unspecified psychoactive substance-induced disorder: Secondary | ICD-10-CM

## 2013-06-15 DIAGNOSIS — F411 Generalized anxiety disorder: Secondary | ICD-10-CM

## 2013-06-15 DIAGNOSIS — F329 Major depressive disorder, single episode, unspecified: Secondary | ICD-10-CM

## 2013-06-15 DIAGNOSIS — F09 Unspecified mental disorder due to known physiological condition: Secondary | ICD-10-CM

## 2013-06-15 MED ORDER — LAMOTRIGINE 100 MG PO TABS: 100.0000 mg | ORAL_TABLET | Freq: Two times a day (BID) | ORAL | Status: DC

## 2013-06-15 MED ORDER — CLONAZEPAM 0.5 MG PO TABS: 0.5000 mg | ORAL_TABLET | Freq: Three times a day (TID) | ORAL | Status: DC | PRN

## 2013-06-15 MED ORDER — BUPROPION HCL ER (XL) 150 MG PO TB24: 150.0000 mg | ORAL_TABLET | ORAL | Status: DC

## 2013-06-15 MED ORDER — PAROXETINE HCL 40 MG PO TABS: ORAL_TABLET | ORAL | Status: DC

## 2013-06-15 NOTE — Telephone Encounter (Signed)
Will try PA on paxil

## 2013-06-15 NOTE — Progress Notes (Signed)
Patient ID: Amy Clay, female   DOB: [birth year], 73 y.o.   MRN: [id number] Patient ID: Amy Clay, female   DOB: 25-Jan-1941, 73 y.o.   MRN: [id number] Patient ID: Amy Clay, female   DOB: [birth year], 73 y.o.   MRN: [id number] [facility] [id number] Progress Note Amy Clay MRN: [id number] DOB: October 22, 1940 Age: 73 y.o.  Date: 06/15/2013  Chief Complaint  Patient presents with  . Anxiety  . Depression  . Follow-up   History of presenting illness Patient is 73 year old Caucasian female who came with her sister for her followup appointment. She and her sister live together in the family farm outside of [place]. Neither one has ever been married or have had children. The patient worked until the early 80s when she began to have difficulties with her nerves.  Currently the patient is still struggling with her mental and physical health. She was admitted last year to a psychiatric hospital in [place] because she was anxious nervous and depressed. Last time Dr. Adele Schilder increased her Risperdal which is helped somewhat. She can't walk and it's not clear why much of it seems to be anxiety based and she seems to have a fear of falling. She's also recently developed possible renal failure and is can be referred to a nephrologist. She was on lithium for a number of years. We do not have her leak recent records from her primary care physician who is outside of [place].  Currently the patient is still depressed despite being on 60 mg of Paxil a day. She has no energy. She sits and watches TV all the time and worries constantly. She's not suicidal but her mood is low. Her memory has been failing her and she gets confused in the afternoons and agitated. A recent brain CT indicated a atrophy and significant white matter disease which is consistent with early dementia.  The patient returns after 2 months. She's now living in the high [facility] assisted [facility] facility. She was  admitted to the hospital last month because she was constantly falling. She went home for a few days and started falling again. Her sister and will social worker from the home health agency daughter the best for her to live in assisted living. She was doing better there for the first 3 weeks but this past week she's been depressed and tearful. She's here today with her sister Zigmund Daniel and her aide Baker Janus. She constantly states that she wants to go home. She's tearful and angry with her sister for putting her in assisted living.  Patient returns after 2 months with her sister Zigmund Daniel. She still in the high [facility] assisted care facility. She is not doing well. She's depressed and shaky. She's extremely anxious. She did well right up until Christmas and even went home for a day or 2 since then she has deteriorated. She is less mobile and isn't trying to get up and do things. Her short-term memory seems quite impaired. I reviewed her brain CT from July that shows global atrophy. Am guessing she is in early to moderate dementia. She hasn't had a physical in a while and I think she needs to see her primary doctor to make sure not missing anything such as a UTI  Past psychiatric history Patient is seeing psychiatrist since 1997 in this office.  Her last psychiatric admission was at [facility] after having suicidal thoughts.  She denies any history of suicidal attempt however endorse history of hopeless feeling.  She is diagnosed with major depressive disorder. She she was very stable on lithium until her creatinine went up and lithium was discontinued.  We had recently tried Lamictal and the dose has been increased recently.  In the past we had tried BuSpar which was discontinued at hospital.  She has another psychiatric inpatient treatment in 90s. She admitted history of mania, depression and psychosis.  Social history Patient lives with her sister.  She has no children.  Her husband died in nursing home.   Patient has no other family.  Medical history Patient was recently admitted due to dehydration and UTI.  Patient has history of arthritis blood pressure and chronic pain. Her primary care physician is Dr. Hilma Favors at [facility].  She takes medication for her blood pressure and arthritis.  She had history of jerking movements, fall and generalized pain. Her last blood work was done on 02/14/2012 which shows anemia, her creatinine was 1.33.  She was given antibiotic result UTI.  Her WBC count was 10.4.  Family History family history includes Anxiety disorder in her paternal aunt and paternal uncle; Arthritis in an other family member; Asthma in an other family member; Bipolar disorder in her cousin; Diabetes in an other family member. There is no history of Colon cancer.  Review of Systems  Constitutional: Positive for malaise/fatigue.  Skin: Negative.   Neurological: Positive for tremors, weakness and headaches.  Psychiatric/Behavioral: Positive for memory loss. Negative for suicidal ideas, hallucinations and substance abuse. The patient is nervous/anxious. The patient does not have insomnia.      Mental status examination Patient is casually dressed and fairly groomed.  She she is tearful  She described her mood is anxious.  Her voice is high-pitched and soft. She she constantly repeats that she wants to go home .Her affect is constricted.  Her attention and concentration is poor.  She has poverty of thought content.  She doesn't auditory or visual hallucination.  There were no paranoia or delusion obsession present at this time.  Her fund of knowledge is okay.  There are no tremors and shakes in her hand.  She's alert and oriented x3.  Her insight judgment and impulse control is okay.  Lab Results:  Results for orders placed during the hospital encounter of 02/14/13 (from the past 8736 hour(s))  CBC   Collection Time    02/14/13 10:07 AM      Result Value Range   WBC 10.1   4.0 - 10.5 K/uL   RBC 3.73 (*) 3.87 - 5.11 MIL/uL   Hemoglobin 11.0 (*) 12.0 - 15.0 g/dL   HCT 35.0 (*) 36.0 - 46.0 %   MCV 93.8  78.0 - 100.0 fL   MCH 29.5  26.0 - 34.0 pg   MCHC 31.4  30.0 - 36.0 g/dL   RDW 14.4  11.5 - 15.5 %   Platelets 204  150 - 400 K/uL  BASIC METABOLIC PANEL   Collection Time    02/14/13 10:07 AM      Result Value Range   Sodium 140  135 - 145 mEq/L   Potassium 4.2  3.5 - 5.1 mEq/L   Chloride 102  96 - 112 mEq/L   CO2 27  19 - 32 mEq/L   Glucose, Bld 146 (*) 70 - 99 mg/dL   BUN 22  6 - 23 mg/dL   Creatinine, Ser 1.59 (*) 0.50 - 1.10 mg/dL   Calcium 10.5  8.4 - 10.5 mg/dL   GFR calc  non Af Amer 31 (*) >90 mL/min   GFR calc Af Amer 36 (*) >90 mL/min  URINALYSIS, ROUTINE W REFLEX MICROSCOPIC   Collection Time    02/14/13 10:20 AM      Result Value Range   Color, Urine YELLOW  YELLOW   APPearance CLEAR  CLEAR   Specific Gravity, Urine 1.015  1.005 - 1.030   pH 6.5  5.0 - 8.0   Glucose, UA NEGATIVE  NEGATIVE mg/dL   Hgb urine dipstick MODERATE (*) NEGATIVE   Bilirubin Urine NEGATIVE  NEGATIVE   Ketones, ur NEGATIVE  NEGATIVE mg/dL   Protein, ur 30 (*) NEGATIVE mg/dL   Urobilinogen, UA 0.2  0.0 - 1.0 mg/dL   Nitrite NEGATIVE  NEGATIVE   Leukocytes, UA LARGE (*) NEGATIVE  URINE MICROSCOPIC-ADD ON   Collection Time    02/14/13 10:20 AM      Result Value Range   WBC, UA TOO NUMEROUS TO COUNT  <3 WBC/hpf   RBC / HPF TOO NUMEROUS TO COUNT  <3 RBC/hpf   Bacteria, UA MANY (*) RARE  URINE CULTURE   Collection Time    02/14/13 10:30 AM      Result Value Range   Specimen Description URINE, CATHETERIZED     Special Requests NONE     Culture  Setup Time       Value: 02/14/2013 21:47     Performed at [facility]       Value: >=100,000 COLONIES/ML     Performed at [facility]   Culture       Value: GROUP B STREP(S.AGALACTIAE)ISOLATED     Note: TESTING AGAINST S. AGALACTIAE NOT ROUTINELY PERFORMED DUE TO PREDICTABILITY  OF AMP/PEN/VAN SUSCEPTIBILITY.     Performed at [facility]   Report Status 02/16/2013 FINAL    Results for orders placed during the hospital encounter of 02/09/13 (from the past 8736 hour(s))  CBC WITH DIFFERENTIAL   Collection Time    02/09/13  7:41 PM      Result Value Range   WBC 5.7  4.0 - 10.5 K/uL   RBC 3.87  3.87 - 5.11 MIL/uL   Hemoglobin 11.2 (*) 12.0 - 15.0 g/dL   HCT 36.3  36.0 - 46.0 %   MCV 93.8  78.0 - 100.0 fL   MCH 28.9  26.0 - 34.0 pg   MCHC 30.9  30.0 - 36.0 g/dL   RDW 14.4  11.5 - 15.5 %   Platelets 211  150 - 400 K/uL   Neutrophils Relative % 72  43 - 77 %   Neutro Abs 4.1  1.7 - 7.7 K/uL   Lymphocytes Relative 18  12 - 46 %   Lymphs Abs 1.0  0.7 - 4.0 K/uL   Monocytes Relative 9  3 - 12 %   Monocytes Absolute 0.5  0.1 - 1.0 K/uL   Eosinophils Relative 1  0 - 5 %   Eosinophils Absolute 0.1  0.0 - 0.7 K/uL   Basophils Relative 0  0 - 1 %   Basophils Absolute 0.0  0.0 - 0.1 K/uL  BASIC METABOLIC PANEL   Collection Time    02/09/13  7:41 PM      Result Value Range   Sodium 137  135 - 145 mEq/L   Potassium 4.8  3.5 - 5.1 mEq/L   Chloride 101  96 - 112 mEq/L   CO2 28  19 - 32 mEq/L   Glucose,  Bld 95  70 - 99 mg/dL   BUN 28 (*) 6 - 23 mg/dL   Creatinine, Ser 1.67 (*) 0.50 - 1.10 mg/dL   Calcium 10.2  8.4 - 10.5 mg/dL   GFR calc non Af Amer 29 (*) >90 mL/min   GFR calc Af Amer 34 (*) >90 mL/min  URINE CULTURE   Collection Time    02/09/13  9:11 PM      Result Value Range   Specimen Description URINE, CLEAN CATCH     Special Requests NONE     Culture  Setup Time       Value: 02/09/2013 21:25     Performed at [facility]       Value: >=100,000 COLONIES/ML     Performed at [facility]   Culture       Value: Multiple bacterial morphotypes present, none predominant. Suggest appropriate recollection if clinically indicated.     Performed at [facility]   Report Status 02/11/2013 FINAL    URINALYSIS,  ROUTINE W REFLEX MICROSCOPIC   Collection Time    02/09/13  9:11 PM      Result Value Range   Color, Urine YELLOW  YELLOW   APPearance CLEAR  CLEAR   Specific Gravity, Urine 1.010  1.005 - 1.030   pH 6.0  5.0 - 8.0   Glucose, UA NEGATIVE  NEGATIVE mg/dL   Hgb urine dipstick NEGATIVE  NEGATIVE   Bilirubin Urine NEGATIVE  NEGATIVE   Ketones, ur NEGATIVE  NEGATIVE mg/dL   Protein, ur NEGATIVE  NEGATIVE mg/dL   Urobilinogen, UA 0.2  0.0 - 1.0 mg/dL   Nitrite NEGATIVE  NEGATIVE   Leukocytes, UA SMALL (*) NEGATIVE  URINE MICROSCOPIC-ADD ON   Collection Time    02/09/13  9:11 PM      Result Value Range   Squamous Epithelial / LPF MANY (*) RARE   WBC, UA 7-10  <3 WBC/hpf   RBC / HPF 0-2  <3 RBC/hpf   Bacteria, UA MANY (*) RARE   Casts HYALINE CASTS (*) NEGATIVE  TSH   Collection Time    02/09/13 11:06 PM      Result Value Range   TSH 0.567  0.350 - 4.500 uIU/mL  URIC ACID   Collection Time    02/09/13 11:06 PM      Result Value Range   Uric Acid, Serum 5.7  2.4 - 7.0 mg/dL  URINE CULTURE   Collection Time    02/10/13  1:04 AM      Result Value Range   Specimen Description URINE, CATHETERIZED     Special Requests NONE     Culture  Setup Time       Value: 02/09/2013 01:20     Performed at [facility]       Value: 50,000 COLONIES/ML     Performed at [facility]   Culture       Value: GROUP B STREP(S.AGALACTIAE)ISOLATED     Note: TESTING AGAINST S. AGALACTIAE NOT ROUTINELY PERFORMED DUE TO PREDICTABILITY OF AMP/PEN/VAN SUSCEPTIBILITY.     Performed at [facility]   Report Status 02/11/2013 FINAL    URINALYSIS, ROUTINE W REFLEX MICROSCOPIC   Collection Time    02/10/13  1:04 AM      Result Value Range   Color, Urine YELLOW  YELLOW   APPearance CLEAR  CLEAR   Specific Gravity, Urine 1.010  1.005 -  1.030   pH 6.5  5.0 - 8.0   Glucose, UA NEGATIVE  NEGATIVE mg/dL   Hgb urine dipstick NEGATIVE  NEGATIVE   Bilirubin Urine NEGATIVE   NEGATIVE   Ketones, ur NEGATIVE  NEGATIVE mg/dL   Protein, ur NEGATIVE  NEGATIVE mg/dL   Urobilinogen, UA 0.2  0.0 - 1.0 mg/dL   Nitrite NEGATIVE  NEGATIVE   Leukocytes, UA NEGATIVE  NEGATIVE  CBC   Collection Time    02/10/13  4:59 AM      Result Value Range   WBC 5.1  4.0 - 10.5 K/uL   RBC 3.73 (*) 3.87 - 5.11 MIL/uL   Hemoglobin 10.8 (*) 12.0 - 15.0 g/dL   HCT 35.0 (*) 36.0 - 46.0 %   MCV 93.8  78.0 - 100.0 fL   MCH 29.0  26.0 - 34.0 pg   MCHC 30.9  30.0 - 36.0 g/dL   RDW 14.4  11.5 - 15.5 %   Platelets 213  150 - 400 K/uL  COMPREHENSIVE METABOLIC PANEL   Collection Time    02/10/13  4:59 AM      Result Value Range   Sodium 141  135 - 145 mEq/L   Potassium 4.3  3.5 - 5.1 mEq/L   Chloride 108  96 - 112 mEq/L   CO2 27  19 - 32 mEq/L   Glucose, Bld 83  70 - 99 mg/dL   BUN 25 (*) 6 - 23 mg/dL   Creatinine, Ser 1.55 (*) 0.50 - 1.10 mg/dL   Calcium 9.9  8.4 - 10.5 mg/dL   Total Protein 6.3  6.0 - 8.3 g/dL   Albumin 3.0 (*) 3.5 - 5.2 g/dL   AST 16  0 - 37 U/L   ALT 7  0 - 35 U/L   Alkaline Phosphatase 113  39 - 117 U/L   Total Bilirubin 0.3  0.3 - 1.2 mg/dL   GFR calc non Af Amer 32 (*) >90 mL/min   GFR calc Af Amer 37 (*) >90 mL/min  VITAMIN B12   Collection Time    02/10/13  1:01 PM      Result Value Range   Vitamin B-12 1682 (*) 211 - 911 pg/mL  FOLATE   Collection Time    02/10/13  1:01 PM      Result Value Range   Folate >20.0    IRON AND TIBC   Collection Time    02/10/13  1:01 PM      Result Value Range   Iron 33 (*) 42 - 135 ug/dL   TIBC 182 (*) 250 - 470 ug/dL   Saturation Ratios 18 (*) 20 - 55 %   UIBC 149  125 - 400 ug/dL  FERRITIN   Collection Time    02/10/13  1:01 PM      Result Value Range   Ferritin 51  10 - 291 ng/mL  RETICULOCYTES   Collection Time    02/10/13  1:01 PM      Result Value Range   Retic Ct Pct 0.8  0.4 - 3.1 %   RBC. 3.68 (*) 3.87 - 5.11 MIL/uL   Retic Count, Manual 29.4  19.0 - 186.0 K/uL  BASIC METABOLIC PANEL    Collection Time    02/11/13  4:49 AM      Result Value Range   Sodium 143  135 - 145 mEq/L   Potassium 4.3  3.5 - 5.1 mEq/L   Chloride 110  96 - 112  mEq/L   CO2 25  19 - 32 mEq/L   Glucose, Bld 90  70 - 99 mg/dL   BUN 23  6 - 23 mg/dL   Creatinine, Ser 1.40 (*) 0.50 - 1.10 mg/dL   Calcium 9.1  8.4 - 10.5 mg/dL   GFR calc non Af Amer 36 (*) >90 mL/min   GFR calc Af Amer 42 (*) >90 mL/min  Results for orders placed during the hospital encounter of 07/22/12 (from the past 8736 hour(s))  CBC WITH DIFFERENTIAL   Collection Time    07/22/12  4:56 PM      Result Value Range   WBC 8.0  4.0 - 10.5 K/uL   RBC 4.01  3.87 - 5.11 MIL/uL   Hemoglobin 12.3  12.0 - 15.0 g/dL   HCT 38.2  36.0 - 46.0 %   MCV 95.3  78.0 - 100.0 fL   MCH 30.7  26.0 - 34.0 pg   MCHC 32.2  30.0 - 36.0 g/dL   RDW 13.0  11.5 - 15.5 %   Platelets 189  150 - 400 K/uL   Neutrophils Relative % 82 (*) 43 - 77 %   Neutro Abs 6.5  1.7 - 7.7 K/uL   Lymphocytes Relative 10 (*) 12 - 46 %   Lymphs Abs 0.8  0.7 - 4.0 K/uL   Monocytes Relative 7  3 - 12 %   Monocytes Absolute 0.5  0.1 - 1.0 K/uL   Eosinophils Relative 1  0 - 5 %   Eosinophils Absolute 0.1  0.0 - 0.7 K/uL   Basophils Relative 0  0 - 1 %   Basophils Absolute 0.0  0.0 - 0.1 K/uL  COMPREHENSIVE METABOLIC PANEL   Collection Time    07/22/12  4:56 PM      Result Value Range   Sodium 138  135 - 145 mEq/L   Potassium 4.4  3.5 - 5.1 mEq/L   Chloride 102  96 - 112 mEq/L   CO2 28  19 - 32 mEq/L   Glucose, Bld 108 (*) 70 - 99 mg/dL   BUN 18  6 - 23 mg/dL   Creatinine, Ser 1.38 (*) 0.50 - 1.10 mg/dL   Calcium 10.0  8.4 - 10.5 mg/dL   Total Protein 6.5  6.0 - 8.3 g/dL   Albumin 3.2 (*) 3.5 - 5.2 g/dL   AST 13  0 - 37 U/L   ALT 7  0 - 35 U/L   Alkaline Phosphatase 96  39 - 117 U/L   Total Bilirubin 0.2 (*) 0.3 - 1.2 mg/dL   GFR calc non Af Amer 37 (*) >90 mL/min   GFR calc Af Amer 43 (*) >90 mL/min  URINE CULTURE   Collection Time    07/22/12  5:51 PM       Result Value Range   Specimen Description URINE, CATHETERIZED     Special Requests NONE     Culture  Setup Time 07/22/2012 18:35     Colony Count NO GROWTH     Culture NO GROWTH     Report Status 07/23/2012 FINAL    URINALYSIS, ROUTINE W REFLEX MICROSCOPIC   Collection Time    07/22/12  5:51 PM      Result Value Range   Color, Urine YELLOW  YELLOW   APPearance CLEAR  CLEAR   Specific Gravity, Urine 1.010  1.005 - 1.030   pH 6.0  5.0 - 8.0   Glucose, UA NEGATIVE  NEGATIVE mg/dL  Hgb urine dipstick NEGATIVE  NEGATIVE   Bilirubin Urine NEGATIVE  NEGATIVE   Ketones, ur NEGATIVE  NEGATIVE mg/dL   Protein, ur NEGATIVE  NEGATIVE mg/dL   Urobilinogen, UA 0.2  0.0 - 1.0 mg/dL   Nitrite NEGATIVE  NEGATIVE   Leukocytes, UA SMALL (*) NEGATIVE  URINE MICROSCOPIC-ADD ON   Collection Time    07/22/12  5:51 PM      Result Value Range   WBC, UA 3-6  <3 WBC/hpf   RBC / HPF 0-2  <3 RBC/hpf   Bacteria, UA MANY (*) RARE  Results for orders placed in visit on 07/02/12 (from the past 8736 hour(s))  COMPREHENSIVE METABOLIC PANEL   Collection Time    07/02/12  1:45 PM      Result Value Range   Sodium 138  135 - 145 mEq/L   Potassium 4.6  3.5 - 5.3 mEq/L   Chloride 104  96 - 112 mEq/L   CO2 28  19 - 32 mEq/L   Glucose, Bld 138 (*) 70 - 99 mg/dL   BUN 27 (*) 6 - 23 mg/dL   Creat 1.43 (*) 0.50 - 1.10 mg/dL   Total Bilirubin 0.4  0.3 - 1.2 mg/dL   Alkaline Phosphatase 86  39 - 117 U/L   AST 17  0 - 37 U/L   ALT <8  0 - 35 U/L   Total Protein 6.3  6.0 - 8.3 g/dL   Albumin 3.7  3.5 - 5.2 g/dL   Calcium 9.7  8.4 - 10.5 mg/dL  CBC WITH DIFFERENTIAL   Collection Time    07/02/12  1:45 PM      Result Value Range   WBC 7.3  4.0 - 10.5 K/uL   RBC 3.87  3.87 - 5.11 MIL/uL   Hemoglobin 11.5 (*) 12.0 - 15.0 g/dL   HCT 35.7 (*) 36.0 - 46.0 %   MCV 92.2  78.0 - 100.0 fL   MCH 29.7  26.0 - 34.0 pg   MCHC 32.2  30.0 - 36.0 g/dL   RDW 13.8  11.5 - 15.5 %   Platelets 250  150 - 400 K/uL    Neutrophils Relative % 81 (*) 43 - 77 %   Neutro Abs 5.9  1.7 - 7.7 K/uL   Lymphocytes Relative 11 (*) 12 - 46 %   Lymphs Abs 0.8  0.7 - 4.0 K/uL   Monocytes Relative 7  3 - 12 %   Monocytes Absolute 0.5  0.1 - 1.0 K/uL   Eosinophils Relative 1  0 - 5 %   Eosinophils Absolute 0.1  0.0 - 0.7 K/uL   Basophils Relative 0  0 - 1 %   Basophils Absolute 0.0  0.0 - 0.1 K/uL   Smear Review Criteria for review not met    HEMOGLOBIN A1C   Collection Time    07/02/12  1:45 PM      Result Value Range   Hemoglobin A1C 5.5  <5.7 %   Mean Plasma Glucose 111  <117 mg/dL   she had labs done last week with her primary care physician we will get the result Diagnoses 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 She is still depressed and anxious. I can increase her Paxil to 60 mg  but this is the highest we can go. I'll also add a very low dose of clonazepam-0.5 mg 3 times a day as needed. She needs to see her primary Dr. soon as possible. Recommend if she does not see any improvement in call us back immediately.  Discussed the risk and benefits of the medication in detail.    Time spent 25 minutes.  More than 50% of the time spent and psychoeducation, counseling and coordination of care.  MEDICATIONS this encounter: Meds ordered this encounter  Medications  . PARoxetine (PAXIL) 40 MG tablet    Sig: Take one and one half at bedtime    Dispense:  45 tablet    Refill:  2  . lamoTRIgine (LAMICTAL) 100 MG tablet    Sig: Take 1 tablet (100 mg total) by mouth 2 (two) times daily.    Dispense:  60 tablet    Refill:  1    She is taking 100 mg twice a d ay. Don't give 25 mg tab and give 100 mg tab  . buPROPion (WELLBUTRIN XL) 150 MG 24 hr tablet    Sig: Take 1  tablet (150 mg total) by mouth every morning.    Dispense:  30 tablet    Refill:  2  . clonazePAM (KLONOPIN) 0.5 MG tablet    Sig: Take 1 tablet (0.5 mg total) by mouth 3 (three) times daily as needed for anxiety.    Dispense:  90 tablet    Refill:  1    Medical Decision Making Problem Points:  Established problem, stable/improving (1), Established problem, worsening (2), New problem, with additional work-up planned (4), Review of last therapy session (1) and Review of psycho-social stressors (1) Data Points:  Review of medication regiment & side effects (2) Review of new medications or change in dosage (2)  I certify that outpatient services furnished can reasonably be expected to improve the patient's condition.   Levonne Spiller, MD

## 2013-06-24 ENCOUNTER — Telehealth (HOSPITAL_COMMUNITY): Payer: Self-pay | Admitting: *Deleted

## 2013-06-24 NOTE — Telephone Encounter (Signed)
done

## 2013-06-28 ENCOUNTER — Other Ambulatory Visit (HOSPITAL_COMMUNITY): Payer: Self-pay | Admitting: Psychiatry

## 2013-06-30 DIAGNOSIS — B351 Tinea unguium: Secondary | ICD-10-CM | POA: Diagnosis not present

## 2013-06-30 DIAGNOSIS — M79609 Pain in unspecified limb: Secondary | ICD-10-CM | POA: Diagnosis not present

## 2013-07-13 ENCOUNTER — Telehealth (HOSPITAL_COMMUNITY): Payer: Self-pay | Admitting: *Deleted

## 2013-07-13 ENCOUNTER — Ambulatory Visit (INDEPENDENT_AMBULATORY_CARE_PROVIDER_SITE_OTHER): Payer: Medicare Other | Admitting: Psychiatry

## 2013-07-13 ENCOUNTER — Other Ambulatory Visit (HOSPITAL_COMMUNITY): Payer: Self-pay | Admitting: Psychiatry

## 2013-07-13 ENCOUNTER — Encounter (HOSPITAL_COMMUNITY): Payer: Self-pay | Admitting: Psychiatry

## 2013-07-13 VITALS — BP 140/80 | Ht 66.0 in | Wt 224.0 lb

## 2013-07-13 DIAGNOSIS — F411 Generalized anxiety disorder: Secondary | ICD-10-CM

## 2013-07-13 DIAGNOSIS — F329 Major depressive disorder, single episode, unspecified: Secondary | ICD-10-CM

## 2013-07-13 DIAGNOSIS — F29 Unspecified psychosis not due to a substance or known physiological condition: Secondary | ICD-10-CM | POA: Diagnosis not present

## 2013-07-13 DIAGNOSIS — F3289 Other specified depressive episodes: Secondary | ICD-10-CM

## 2013-07-13 DIAGNOSIS — F09 Unspecified mental disorder due to known physiological condition: Secondary | ICD-10-CM

## 2013-07-13 DIAGNOSIS — F5105 Insomnia due to other mental disorder: Secondary | ICD-10-CM

## 2013-07-13 DIAGNOSIS — F331 Major depressive disorder, recurrent, moderate: Secondary | ICD-10-CM

## 2013-07-13 DIAGNOSIS — F1999 Other psychoactive substance use, unspecified with unspecified psychoactive substance-induced disorder: Secondary | ICD-10-CM

## 2013-07-13 MED ORDER — RISPERIDONE 1 MG PO TABS: 1.0000 mg | ORAL_TABLET | Freq: Every day | ORAL | Status: DC

## 2013-07-13 MED ORDER — RISPERIDONE 0.25 MG PO TABS: ORAL_TABLET | ORAL | Status: DC

## 2013-07-13 MED ORDER — ALPRAZOLAM 0.5 MG PO TABS: 0.5000 mg | ORAL_TABLET | Freq: Three times a day (TID) | ORAL | Status: DC | PRN

## 2013-07-13 NOTE — Progress Notes (Signed)
Patient ID: Amy Clay, female   DOB: [birth year], 73 y.o.   MRN: [id number] Patient ID: Amy Clay, female   DOB: 1940-11-02, 73 y.o.   MRN: [id number] Patient ID: Amy Clay, female   DOB: June 05, 1940, 73 y.o.   MRN: [id number] Patient ID: Amy Clay, female   DOB: 1940/10/14, 73 y.o.   MRN: [id number] [facility] [id number] Progress Note Amy Clay MRN: [id number] DOB: March 22, 1941 Age: 73 y.o.  Date: 07/13/2013  Chief Complaint  Patient presents with  . Anxiety  . Depression  . Follow-up   History of presenting illness Patient is 73 year old Caucasian female who came with her sister for her followup appointment. She and her sister live together in the family farm outside of [place]. Neither one has ever been married or have had children. The patient worked until the early 80s when she began to have difficulties with her nerves.  Currently the patient is still struggling with her mental and physical health. She was admitted last year to a psychiatric hospital in [place] because she was anxious nervous and depressed. Last time Dr. Lolly Mustache increased her Risperdal which is helped somewhat. She can't walk and it's not clear why much of it seems to be anxiety based and she seems to have a fear of falling. She's also recently developed possible renal failure and is can be referred to a nephrologist. She was on lithium for a number of years. We do not have her leak recent records from her primary care physician who is outside of [place].  Currently the patient is still depressed despite being on 60 mg of Paxil a day. She has no energy. She sits and watches TV all the time and worries constantly. She's not suicidal but her mood is low. Her memory has been failing her and she gets confused in the afternoons and agitated. A recent brain CT indicated a atrophy and significant white matter disease which is consistent with early dementia.  The patient returns after  2 months. She's now living in the [facility] [place] assisted care facility. She was admitted to the hospital last month because she was constantly falling. She went home for a few days and started falling again. Her sister and will social worker from the home health agency daughter the best for her to live in assisted living. She was doing better there for the first 3 weeks but this past week she's been depressed and tearful. She's here today with her sister [name] and her aide Dondra Spry. She constantly states that she wants to go home. She's tearful and angry with her sister for putting her in assisted living.  Patient returns after one month with her sister [name]. She still not doing well. She only practices or walking when [name] comes to visit. She's gained about 20 pounds and she's been in a nursing home. She's very depressed and extremely anxious and worries about everything. I increased her Paxil but hasn't done much for her. She self conscious and paranoid that people are watching her in the nursing home. I told her sister we could try switching the Klonopin to Xanax but ultimately she may need rehospitalization to reorganize her medications. She denies being suicidal but does look quite depressed and anxious.  Past psychiatric history Patient is seeing psychiatrist since 1997 in this office.  Her last psychiatric admission was at [facility] after having suicidal thoughts.  She denies any history of suicidal attempt however endorse history of hopeless feeling.  She is diagnosed with major depressive disorder. She she was very stable on lithium until her creatinine went up and lithium was discontinued.  We had recently tried Lamictal and the dose has been increased recently.  In the past we had tried BuSpar which was discontinued at hospital.  She has another psychiatric inpatient treatment in 90s. She admitted history of mania, depression and psychosis.  Social history Patient lives with her sister.   She has no children.  Her husband died in nursing home.  Patient has no other family.  Medical history Patient was recently admitted due to dehydration and UTI.  Patient has history of arthritis blood pressure and chronic pain. Her primary care physician is Dr. Phillips Odor at [facility]  She takes medication for her blood pressure and arthritis.  She had history of jerking movements, fall and generalized pain. Her last blood work was done on 02/14/2012 which shows anemia, her creatinine was 1.33.  She was given antibiotic result UTI.  Her WBC count was 10.4.  Family History family history includes Anxiety disorder in her paternal aunt and paternal uncle; Arthritis in an other family member; Asthma in an other family member; Bipolar disorder in her cousin; Diabetes in an other family member. There is no history of Colon cancer.  Review of Systems  Constitutional: Positive for malaise/fatigue.  Skin: Negative.   Neurological: Positive for tremors, weakness and headaches.  Psychiatric/Behavioral: Positive for memory loss. Negative for suicidal ideas, hallucinations and substance abuse. The patient is nervous/anxious. The patient does not have insomnia.      Mental status examination Patient is casually dressed and fairly groomed.  She she is tearful  She described her mood is anxious.  Her voice is high-pitched and soft.  .Her affect is constricted.  Her attention and concentration is poor.  She has poverty of thought content.  She doesn't auditory or visual hallucination.  There were no paranoia or delusion obsession present at this time.  Her fund of knowledge is okay.  There are no tremors and shakes in her hand.  She's alert and oriented x3.  Her insight judgment and impulse control is okay.  Lab Results:  Results for orders placed during the hospital encounter of 02/14/13 (from the past 8736 hour(s))  CBC   Collection Time    02/14/13 10:07 AM      Result Value Ref Range   WBC  10.1  4.0 - 10.5 K/uL   RBC 3.73 (*) 3.87 - 5.11 MIL/uL   Hemoglobin 11.0 (*) 12.0 - 15.0 g/dL   HCT 40.9 (*) 81.1 - 91.4 %   MCV 93.8  78.0 - 100.0 fL   MCH 29.5  26.0 - 34.0 pg   MCHC 31.4  30.0 - 36.0 g/dL   RDW 78.2  95.6 - 21.3 %   Platelets 204  150 - 400 K/uL  BASIC METABOLIC PANEL   Collection Time    02/14/13 10:07 AM      Result Value Ref Range   Sodium 140  135 - 145 mEq/L   Potassium 4.2  3.5 - 5.1 mEq/L   Chloride 102  96 - 112 mEq/L   CO2 27  19 - 32 mEq/L   Glucose, Bld 146 (*) 70 - 99 mg/dL   BUN 22  6 - 23 mg/dL   Creatinine, Ser 0.86 (*) 0.50 - 1.10 mg/dL   Calcium 57.8  8.4 - 46.9 mg/dL   GFR calc non Af Amer 31 (*) >90 mL/min  GFR calc Af Amer 36 (*) >90 mL/min  URINALYSIS, ROUTINE W REFLEX MICROSCOPIC   Collection Time    02/14/13 10:20 AM      Result Value Ref Range   Color, Urine YELLOW  YELLOW   APPearance CLEAR  CLEAR   Specific Gravity, Urine 1.015  1.005 - 1.030   pH 6.5  5.0 - 8.0   Glucose, UA NEGATIVE  NEGATIVE mg/dL   Hgb urine dipstick MODERATE (*) NEGATIVE   Bilirubin Urine NEGATIVE  NEGATIVE   Ketones, ur NEGATIVE  NEGATIVE mg/dL   Protein, ur 30 (*) NEGATIVE mg/dL   Urobilinogen, UA 0.2  0.0 - 1.0 mg/dL   Nitrite NEGATIVE  NEGATIVE   Leukocytes, UA LARGE (*) NEGATIVE  URINE MICROSCOPIC-ADD ON   Collection Time    02/14/13 10:20 AM      Result Value Ref Range   WBC, UA TOO NUMEROUS TO COUNT  <3 WBC/hpf   RBC / HPF TOO NUMEROUS TO COUNT  <3 RBC/hpf   Bacteria, UA MANY (*) RARE  URINE CULTURE   Collection Time    02/14/13 10:30 AM      Result Value Ref Range   Specimen Description URINE, CATHETERIZED     Special Requests NONE     Culture  Setup Time       Value: 02/14/2013 21:47     Performed at [facility] Count       Value: >=100,000 COLONIES/ML     Performed at [facility]   Culture       Value: GROUP B STREP(S.AGALACTIAE)ISOLATED     Note: TESTING AGAINST S. AGALACTIAE NOT ROUTINELY PERFORMED  DUE TO PREDICTABILITY OF AMP/PEN/VAN SUSCEPTIBILITY.     Performed at [facility]   Report Status 02/16/2013 FINAL    Results for orders placed during the hospital encounter of 02/09/13 (from the past 8736 hour(s))  CBC WITH DIFFERENTIAL   Collection Time    02/09/13  7:41 PM      Result Value Ref Range   WBC 5.7  4.0 - 10.5 K/uL   RBC 3.87  3.87 - 5.11 MIL/uL   Hemoglobin 11.2 (*) 12.0 - 15.0 g/dL   HCT 09.6  04.5 - 40.9 %   MCV 93.8  78.0 - 100.0 fL   MCH 28.9  26.0 - 34.0 pg   MCHC 30.9  30.0 - 36.0 g/dL   RDW 81.1  91.4 - 78.2 %   Platelets 211  150 - 400 K/uL   Neutrophils Relative % 72  43 - 77 %   Neutro Abs 4.1  1.7 - 7.7 K/uL   Lymphocytes Relative 18  12 - 46 %   Lymphs Abs 1.0  0.7 - 4.0 K/uL   Monocytes Relative 9  3 - 12 %   Monocytes Absolute 0.5  0.1 - 1.0 K/uL   Eosinophils Relative 1  0 - 5 %   Eosinophils Absolute 0.1  0.0 - 0.7 K/uL   Basophils Relative 0  0 - 1 %   Basophils Absolute 0.0  0.0 - 0.1 K/uL  BASIC METABOLIC PANEL   Collection Time    02/09/13  7:41 PM      Result Value Ref Range   Sodium 137  135 - 145 mEq/L   Potassium 4.8  3.5 - 5.1 mEq/L   Chloride 101  96 - 112 mEq/L   CO2 28  19 - 32 mEq/L   Glucose, Bld 95  70 -  99 mg/dL   BUN 28 (*) 6 - 23 mg/dL   Creatinine, Ser 4.09 (*) 0.50 - 1.10 mg/dL   Calcium 81.1  8.4 - 91.4 mg/dL   GFR calc non Af Amer 29 (*) >90 mL/min   GFR calc Af Amer 34 (*) >90 mL/min  URINE CULTURE   Collection Time    02/09/13  9:11 PM      Result Value Ref Range   Specimen Description URINE, CLEAN CATCH     Special Requests NONE     Culture  Setup Time       Value: 02/09/2013 21:25     Performed at [facility] Count       Value: >=100,000 COLONIES/ML     Performed at [facility]   Culture       Value: Multiple bacterial morphotypes present, none predominant. Suggest appropriate recollection if clinically indicated.     Performed at [facility]   Report Status  02/11/2013 FINAL    URINALYSIS, ROUTINE W REFLEX MICROSCOPIC   Collection Time    02/09/13  9:11 PM      Result Value Ref Range   Color, Urine YELLOW  YELLOW   APPearance CLEAR  CLEAR   Specific Gravity, Urine 1.010  1.005 - 1.030   pH 6.0  5.0 - 8.0   Glucose, UA NEGATIVE  NEGATIVE mg/dL   Hgb urine dipstick NEGATIVE  NEGATIVE   Bilirubin Urine NEGATIVE  NEGATIVE   Ketones, ur NEGATIVE  NEGATIVE mg/dL   Protein, ur NEGATIVE  NEGATIVE mg/dL   Urobilinogen, UA 0.2  0.0 - 1.0 mg/dL   Nitrite NEGATIVE  NEGATIVE   Leukocytes, UA SMALL (*) NEGATIVE  URINE MICROSCOPIC-ADD ON   Collection Time    02/09/13  9:11 PM      Result Value Ref Range   Squamous Epithelial / LPF MANY (*) RARE   WBC, UA 7-10  <3 WBC/hpf   RBC / HPF 0-2  <3 RBC/hpf   Bacteria, UA MANY (*) RARE   Casts HYALINE CASTS (*) NEGATIVE  TSH   Collection Time    02/09/13 11:06 PM      Result Value Ref Range   TSH 0.567  0.350 - 4.500 uIU/mL  URIC ACID   Collection Time    02/09/13 11:06 PM      Result Value Ref Range   Uric Acid, Serum 5.7  2.4 - 7.0 mg/dL  URINE CULTURE   Collection Time    02/10/13  1:04 AM      Result Value Ref Range   Specimen Description URINE, CATHETERIZED     Special Requests NONE     Culture  Setup Time       Value: 02/09/2013 01:20     Performed at [facility] Count       Value: 50,000 COLONIES/ML     Performed at [facility]   Culture       Value: GROUP B STREP(S.AGALACTIAE)ISOLATED     Note: TESTING AGAINST S. AGALACTIAE NOT ROUTINELY PERFORMED DUE TO PREDICTABILITY OF AMP/PEN/VAN SUSCEPTIBILITY.     Performed at [facility]   Report Status 02/11/2013 FINAL    URINALYSIS, ROUTINE W REFLEX MICROSCOPIC   Collection Time    02/10/13  1:04 AM      Result Value Ref Range   Color, Urine YELLOW  YELLOW   APPearance CLEAR  CLEAR   Specific Gravity, Urine 1.010  1.005 - 1.030   pH 6.5  5.0 - 8.0   Glucose, UA NEGATIVE  NEGATIVE mg/dL   Hgb urine  dipstick NEGATIVE  NEGATIVE   Bilirubin Urine NEGATIVE  NEGATIVE   Ketones, ur NEGATIVE  NEGATIVE mg/dL   Protein, ur NEGATIVE  NEGATIVE mg/dL   Urobilinogen, UA 0.2  0.0 - 1.0 mg/dL   Nitrite NEGATIVE  NEGATIVE   Leukocytes, UA NEGATIVE  NEGATIVE  CBC   Collection Time    02/10/13  4:59 AM      Result Value Ref Range   WBC 5.1  4.0 - 10.5 K/uL   RBC 3.73 (*) 3.87 - 5.11 MIL/uL   Hemoglobin 10.8 (*) 12.0 - 15.0 g/dL   HCT 16.1 (*) 09.6 - 04.5 %   MCV 93.8  78.0 - 100.0 fL   MCH 29.0  26.0 - 34.0 pg   MCHC 30.9  30.0 - 36.0 g/dL   RDW 40.9  81.1 - 91.4 %   Platelets 213  150 - 400 K/uL  COMPREHENSIVE METABOLIC PANEL   Collection Time    02/10/13  4:59 AM      Result Value Ref Range   Sodium 141  135 - 145 mEq/L   Potassium 4.3  3.5 - 5.1 mEq/L   Chloride 108  96 - 112 mEq/L   CO2 27  19 - 32 mEq/L   Glucose, Bld 83  70 - 99 mg/dL   BUN 25 (*) 6 - 23 mg/dL   Creatinine, Ser 7.82 (*) 0.50 - 1.10 mg/dL   Calcium 9.9  8.4 - 95.6 mg/dL   Total Protein 6.3  6.0 - 8.3 g/dL   Albumin 3.0 (*) 3.5 - 5.2 g/dL   AST 16  0 - 37 U/L   ALT 7  0 - 35 U/L   Alkaline Phosphatase 113  39 - 117 U/L   Total Bilirubin 0.3  0.3 - 1.2 mg/dL   GFR calc non Af Amer 32 (*) >90 mL/min   GFR calc Af Amer 37 (*) >90 mL/min  VITAMIN B12   Collection Time    02/10/13  1:01 PM      Result Value Ref Range   Vitamin B-12 1682 (*) 211 - 911 pg/mL  FOLATE   Collection Time    02/10/13  1:01 PM      Result Value Ref Range   Folate >20.0    IRON AND TIBC   Collection Time    02/10/13  1:01 PM      Result Value Ref Range   Iron 33 (*) 42 - 135 ug/dL   TIBC 213 (*) 086 - 578 ug/dL   Saturation Ratios 18 (*) 20 - 55 %   UIBC 149  125 - 400 ug/dL  FERRITIN   Collection Time    02/10/13  1:01 PM      Result Value Ref Range   Ferritin 51  10 - 291 ng/mL  RETICULOCYTES   Collection Time    02/10/13  1:01 PM      Result Value Ref Range   Retic Ct Pct 0.8  0.4 - 3.1 %   RBC. 3.68 (*) 3.87 - 5.11  MIL/uL   Retic Count, Manual 29.4  19.0 - 186.0 K/uL  BASIC METABOLIC PANEL   Collection Time    02/11/13  4:49 AM      Result Value Ref Range   Sodium 143  135 - 145 mEq/L   Potassium 4.3  3.5 -  5.1 mEq/L   Chloride 110  96 - 112 mEq/L   CO2 25  19 - 32 mEq/L   Glucose, Bld 90  70 - 99 mg/dL   BUN 23  6 - 23 mg/dL   Creatinine, Ser 1.611.40 (*) 0.50 - 1.10 mg/dL   Calcium 9.1  8.4 - 09.610.5 mg/dL   GFR calc non Af Amer 36 (*) >90 mL/min   GFR calc Af Amer 42 (*) >90 mL/min  Results for orders placed during the hospital encounter of 07/22/12 (from the past 8736 hour(s))  CBC WITH DIFFERENTIAL   Collection Time    07/22/12  4:56 PM      Result Value Ref Range   WBC 8.0  4.0 - 10.5 K/uL   RBC 4.01  3.87 - 5.11 MIL/uL   Hemoglobin 12.3  12.0 - 15.0 g/dL   HCT 04.538.2  40.936.0 - 81.146.0 %   MCV 95.3  78.0 - 100.0 fL   MCH 30.7  26.0 - 34.0 pg   MCHC 32.2  30.0 - 36.0 g/dL   RDW 91.413.0  78.211.5 - 95.615.5 %   Platelets 189  150 - 400 K/uL   Neutrophils Relative % 82 (*) 43 - 77 %   Neutro Abs 6.5  1.7 - 7.7 K/uL   Lymphocytes Relative 10 (*) 12 - 46 %   Lymphs Abs 0.8  0.7 - 4.0 K/uL   Monocytes Relative 7  3 - 12 %   Monocytes Absolute 0.5  0.1 - 1.0 K/uL   Eosinophils Relative 1  0 - 5 %   Eosinophils Absolute 0.1  0.0 - 0.7 K/uL   Basophils Relative 0  0 - 1 %   Basophils Absolute 0.0  0.0 - 0.1 K/uL  COMPREHENSIVE METABOLIC PANEL   Collection Time    07/22/12  4:56 PM      Result Value Ref Range   Sodium 138  135 - 145 mEq/L   Potassium 4.4  3.5 - 5.1 mEq/L   Chloride 102  96 - 112 mEq/L   CO2 28  19 - 32 mEq/L   Glucose, Bld 108 (*) 70 - 99 mg/dL   BUN 18  6 - 23 mg/dL   Creatinine, Ser 2.131.38 (*) 0.50 - 1.10 mg/dL   Calcium 08.610.0  8.4 - 57.810.5 mg/dL   Total Protein 6.5  6.0 - 8.3 g/dL   Albumin 3.2 (*) 3.5 - 5.2 g/dL   AST 13  0 - 37 U/L   ALT 7  0 - 35 U/L   Alkaline Phosphatase 96  39 - 117 U/L   Total Bilirubin 0.2 (*) 0.3 - 1.2 mg/dL   GFR calc non Af Amer 37 (*) >90 mL/min   GFR  calc Af Amer 43 (*) >90 mL/min  URINE CULTURE   Collection Time    07/22/12  5:51 PM      Result Value Ref Range   Specimen Description URINE, CATHETERIZED     Special Requests NONE     Culture  Setup Time 07/22/2012 18:35     Colony Count NO GROWTH     Culture NO GROWTH     Report Status 07/23/2012 FINAL    URINALYSIS, ROUTINE W REFLEX MICROSCOPIC   Collection Time    07/22/12  5:51 PM      Result Value Ref Range   Color, Urine YELLOW  YELLOW   APPearance CLEAR  CLEAR   Specific Gravity, Urine 1.010  1.005 - 1.030  pH 6.0  5.0 - 8.0   Glucose, UA NEGATIVE  NEGATIVE mg/dL   Hgb urine dipstick NEGATIVE  NEGATIVE   Bilirubin Urine NEGATIVE  NEGATIVE   Ketones, ur NEGATIVE  NEGATIVE mg/dL   Protein, ur NEGATIVE  NEGATIVE mg/dL   Urobilinogen, UA 0.2  0.0 - 1.0 mg/dL   Nitrite NEGATIVE  NEGATIVE   Leukocytes, UA SMALL (*) NEGATIVE  URINE MICROSCOPIC-ADD ON   Collection Time    07/22/12  5:51 PM      Result Value Ref Range   WBC, UA 3-6  <3 WBC/hpf   RBC / HPF 0-2  <3 RBC/hpf   Bacteria, UA MANY (*) RARE   she had labs done last week with her primary care physician we will get the result Diagnoses 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 She is still depressed and anxious. I will also change her from clonazepam to Xanax.. She needs to see her primary Dr. soon as possible. I put in an order for this as well as a physical therapy assessment Recommend if she does not see any improvement in call us back immediately.  Discussed the risk and benefits of the medication in detail.    Time spent 25 minutes.  More than 50% of the time spent and psychoeducation, counseling and coordination of care. She'll return in four-week so he may consider  hospitalization at that time  MEDICATIONS this encounter: Meds ordered this encounter  Medications  . ALPRAZolam (XANAX) 0.5 MG tablet    Sig: Take 1 tablet (0.5 mg total) by mouth 3 (three) times daily as needed for sleep or anxiety.    Dispense:  90 tablet    Refill:  2  . risperiDONE (RISPERDAL) 1 MG tablet    Sig: Take 1 tablet (1 mg total) by mouth at bedtime.    Dispense:  30 tablet    Refill:  2  . risperiDONE (RISPERDAL) 0.25 MG tablet    Sig: Take one tid, 8 am, 2-pm and 5 pm    Dispense:  90 tablet    Refill:  3    Medical Decision Making Problem Points:  Established problem, stable/improving (1), Established problem, worsening (2), New problem, with additional work-up planned (4), Review of last therapy session (1) and Review of psycho-social stressors (1) Data Points:  Review of medication regiment & side effects (2) Review of new medications or change in dosage (2)  I certify that outpatient services furnished can reasonably be expected to improve the patient's condition.   Amy Ruder, MD

## 2013-07-14 ENCOUNTER — Telehealth (HOSPITAL_COMMUNITY): Payer: Self-pay | Admitting: *Deleted

## 2013-07-14 NOTE — Telephone Encounter (Signed)
Switched to lorazepam

## 2013-07-14 NOTE — Telephone Encounter (Signed)
Switched to lorazepam 

## 2013-07-18 ENCOUNTER — Emergency Department (HOSPITAL_COMMUNITY): Payer: Medicare Other

## 2013-07-18 ENCOUNTER — Encounter (HOSPITAL_COMMUNITY): Payer: Self-pay | Admitting: Emergency Medicine

## 2013-07-18 ENCOUNTER — Other Ambulatory Visit: Payer: Self-pay

## 2013-07-18 ENCOUNTER — Emergency Department (HOSPITAL_COMMUNITY)
Admission: EM | Admit: 2013-07-18 | Discharge: 2013-07-18 | Disposition: A | Discharge status: Home / Self Care | Payer: Medicare Other | Attending: Emergency Medicine | Admitting: Emergency Medicine

## 2013-07-18 DIAGNOSIS — IMO0002 Reserved for concepts with insufficient information to code with codable children: Secondary | ICD-10-CM | POA: Diagnosis not present

## 2013-07-18 DIAGNOSIS — R4182 Altered mental status, unspecified: Secondary | ICD-10-CM | POA: Diagnosis not present

## 2013-07-18 DIAGNOSIS — S0083XA Contusion of other part of head, initial encounter: Principal | ICD-10-CM

## 2013-07-18 DIAGNOSIS — M549 Dorsalgia, unspecified: Secondary | ICD-10-CM

## 2013-07-18 DIAGNOSIS — Z79899 Other long term (current) drug therapy: Secondary | ICD-10-CM | POA: Diagnosis not present

## 2013-07-18 DIAGNOSIS — F319 Bipolar disorder, unspecified: Secondary | ICD-10-CM | POA: Insufficient documentation

## 2013-07-18 DIAGNOSIS — S0993XA Unspecified injury of face, initial encounter: Secondary | ICD-10-CM | POA: Insufficient documentation

## 2013-07-18 DIAGNOSIS — S199XXA Unspecified injury of neck, initial encounter: Secondary | ICD-10-CM | POA: Diagnosis not present

## 2013-07-18 DIAGNOSIS — Y9210 Unspecified residential institution as the place of occurrence of the external cause: Secondary | ICD-10-CM | POA: Insufficient documentation

## 2013-07-18 DIAGNOSIS — W06XXXA Fall from bed, initial encounter: Secondary | ICD-10-CM | POA: Insufficient documentation

## 2013-07-18 DIAGNOSIS — E785 Hyperlipidemia, unspecified: Secondary | ICD-10-CM | POA: Diagnosis not present

## 2013-07-18 DIAGNOSIS — Z7982 Long term (current) use of aspirin: Secondary | ICD-10-CM | POA: Diagnosis not present

## 2013-07-18 DIAGNOSIS — T148XXA Other injury of unspecified body region, initial encounter: Secondary | ICD-10-CM | POA: Diagnosis not present

## 2013-07-18 DIAGNOSIS — S1093XA Contusion of unspecified part of neck, initial encounter: Secondary | ICD-10-CM | POA: Diagnosis not present

## 2013-07-18 DIAGNOSIS — S298XXA Other specified injuries of thorax, initial encounter: Secondary | ICD-10-CM | POA: Diagnosis not present

## 2013-07-18 DIAGNOSIS — Z9889 Other specified postprocedural states: Secondary | ICD-10-CM | POA: Insufficient documentation

## 2013-07-18 DIAGNOSIS — R51 Headache: Secondary | ICD-10-CM | POA: Diagnosis not present

## 2013-07-18 DIAGNOSIS — Y939 Activity, unspecified: Secondary | ICD-10-CM | POA: Insufficient documentation

## 2013-07-18 DIAGNOSIS — W19XXXA Unspecified fall, initial encounter: Secondary | ICD-10-CM

## 2013-07-18 DIAGNOSIS — F411 Generalized anxiety disorder: Secondary | ICD-10-CM | POA: Diagnosis not present

## 2013-07-18 DIAGNOSIS — Z880 Allergy status to penicillin: Secondary | ICD-10-CM | POA: Diagnosis not present

## 2013-07-18 DIAGNOSIS — S0003XA Contusion of scalp, initial encounter: Secondary | ICD-10-CM | POA: Insufficient documentation

## 2013-07-18 DIAGNOSIS — M129 Arthropathy, unspecified: Secondary | ICD-10-CM | POA: Diagnosis not present

## 2013-07-18 LAB — URINALYSIS, ROUTINE W REFLEX MICROSCOPIC
BILIRUBIN URINE: NEGATIVE
Glucose, UA: NEGATIVE mg/dL
Hgb urine dipstick: NEGATIVE
Ketones, ur: NEGATIVE mg/dL
LEUKOCYTES UA: NEGATIVE
NITRITE: NEGATIVE
PH: 6.5 (ref 5.0–8.0)
Protein, ur: NEGATIVE mg/dL
SPECIFIC GRAVITY, URINE: 1.01 (ref 1.005–1.030)
Urobilinogen, UA: 0.2 mg/dL (ref 0.0–1.0)

## 2013-07-18 LAB — COMPREHENSIVE METABOLIC PANEL
ALT: 71 U/L — ABNORMAL HIGH (ref 0–35)
AST: 71 U/L — ABNORMAL HIGH (ref 0–37)
Albumin: 3.3 g/dL — ABNORMAL LOW (ref 3.5–5.2)
Alkaline Phosphatase: 127 U/L — ABNORMAL HIGH (ref 39–117)
BUN: 37 mg/dL — AB (ref 6–23)
CALCIUM: 10 mg/dL (ref 8.4–10.5)
CO2: 27 meq/L (ref 19–32)
CREATININE: 1.61 mg/dL — AB (ref 0.50–1.10)
Chloride: 104 mEq/L (ref 96–112)
GFR calc Af Amer: 35 mL/min — ABNORMAL LOW (ref >90)
GFR calc non Af Amer: 30 mL/min — ABNORMAL LOW (ref >90)
Glucose, Bld: 126 mg/dL — ABNORMAL HIGH (ref 70–99)
Potassium: 4.5 mEq/L (ref 3.7–5.3)
SODIUM: 141 meq/L (ref 137–147)
TOTAL PROTEIN: 7.2 g/dL (ref 6.0–8.3)
Total Bilirubin: 0.2 mg/dL — ABNORMAL LOW (ref 0.3–1.2)

## 2013-07-18 LAB — CBC
HEMATOCRIT: 36.6 % (ref 36.0–46.0)
HEMOGLOBIN: 11.2 g/dL — AB (ref 12.0–15.0)
MCH: 28.8 pg (ref 26.0–34.0)
MCHC: 30.6 g/dL (ref 30.0–36.0)
MCV: 94.1 fL (ref 78.0–100.0)
Platelets: 230 10*3/uL (ref 150–400)
RBC: 3.89 MIL/uL (ref 3.87–5.11)
RDW: 13.8 % (ref 11.5–15.5)
WBC: 7 10*3/uL (ref 4.0–10.5)

## 2013-07-18 LAB — CBG MONITORING, ED: Glucose-Capillary: 118 mg/dL — ABNORMAL HIGH (ref 70–99)

## 2013-07-18 LAB — TROPONIN I

## 2013-07-18 MED ORDER — ACETAMINOPHEN 500 MG PO TABS: 500.0000 mg | ORAL_TABLET | Freq: Four times a day (QID) | ORAL | Status: DC | PRN

## 2013-07-18 MED ORDER — HYDROCODONE-ACETAMINOPHEN 5-325 MG PO TABS: 1.0000 | ORAL_TABLET | Freq: Once | ORAL | Status: DC

## 2013-07-18 MED ORDER — ACETAMINOPHEN 500 MG PO TABS
500.0000 mg | ORAL_TABLET | Freq: Once | ORAL | Status: AC
  Administered 2013-07-18: 500 mg via ORAL
  Filled 2013-07-18: qty 1

## 2013-07-18 NOTE — ED Provider Notes (Signed)
Medical screening examination/treatment/procedure(s) were conducted as a shared visit with non-physician practitioner(s) and myself.  I personally evaluated the patient during the encounter.   EKG Interpretation None       Shon Batonourtney F Horton, MD 07/18/13 1718

## 2013-07-18 NOTE — ED Provider Notes (Signed)
Medical screening examination/treatment/procedure(s) were conducted as a shared visit with non-physician practitioner(s) and myself.  I personally evaluated the patient during the encounter.   EKG Interpretation None      EKG independently reviewed by myself: Normal sinus rhythm with a rate of 88, no evidence of acute ST elevation or ischemia, normal intervals and no evidence of arrhythmia  [age] are old female found on the floor of her living facility. Patient cannot give any information regarding fall. Reporting headache.  Nonfocal on exam. Per patient's family, patient is at her baseline. Lab work and imaging reassuring.  I have shared with patient and family results, patient will be discharged back to her living facility.  After history, exam, and medical workup I feel the patient has been appropriately medically screened and is safe for discharge home. Pertinent diagnoses were discussed with the patient. Patient was given return precautions.   Shon Batonourtney F Tessah Patchen, MD 07/18/13 [phone]

## 2013-07-18 NOTE — ED Provider Notes (Signed)
CSN: [id number]     Arrival date & time 07/18/13  0820 History   First MD Initiated Contact with Patient 07/18/13 [phone]     Chief Complaint  Patient presents with  . Fall     (Consider location/radiation/quality/duration/timing/severity/associated sxs/prior Treatment) HPI Comments: Patient is a 73 year old female past medical history significant for hypertension, arthritis, depression, anxiety, hyperlipidemia, bipolar 1 disorder presenting to the emergency department via EMS from [facility] after an unwitnessed fall. According to staff the patient was found on back beside the bed. Patient states she is having mild to moderate neck and low back pain. Patient is aware that she is here for a fall, but does for any events leading up to the fall. Patient also believes she was brought in from home not from [facility]. Patient is unsure about LOC. Patient is a level V caveat d/t mental status changes.   Patient is a 73 y.o. female presenting with fall.  Fall Associated symptoms include neck pain.    Past Medical History  Diagnosis Date  . High blood pressure   . Arthritis   . Depression   . Anxiety   . Hemorrhoids   . Hyperlipidemia   . Bipolar 1 disorder    Past Surgical History  Procedure Laterality Date  . Back surgery  03/16/87  . Cholecystectomy  08/15/93  . Partial hysterectomy  04/04/95  . Bladder tacked  04/04/95  . Ankle surgery  04/28/97    right ankle fracture  . Wrist surgery  02/24/09    right wrist fracture  . Colonoscopy  10/26/2002    Dr. Karilyn Cota- small external hemorrhoids otherwise normal   . Breast biopsy  05/16/95    right side  . Savory dilation  10/31/2011    Procedure: SAVORY DILATION;  Surgeon: Corbin Ade, MD;  Location: [facility];  Service: Endoscopy;  Laterality: N/A;  Elease Hashimoto dilation  10/31/2011    Procedure: Elease Hashimoto DILATION;  Surgeon: Corbin Ade, MD;  Location: [facility];  Service: Endoscopy;  Laterality: N/A;  . Abdominal hysterectomy      Family History  Problem Relation Age of Onset  . Arthritis    . Asthma    . Diabetes    . Colon cancer Neg Hx   . Anxiety disorder Paternal Aunt   . Anxiety disorder Paternal Uncle   . Bipolar disorder Cousin    History  Substance Use Topics  . Smoking status: Never Smoker   . Smokeless tobacco: Never Used  . Alcohol Use: No   OB History   Grav Para Term Preterm Abortions TAB SAB Ect Mult Living            0     Review of Systems  Unable to perform ROS: Mental status change  Musculoskeletal: Positive for back pain and neck pain.      Allergies  Benzodiazepines; Ciprofloxacin; Cephalexin; and Penicillins  Home Medications   Current Outpatient Rx  Name  Route  Sig  Dispense  Refill  . acetaminophen (TYLENOL) 500 MG tablet   Oral   Take 1,000 mg by mouth every 6 (six) hours as needed for pain.         Marland Kitchen aspirin EC 81 MG tablet   Oral   Take 81 mg by mouth every morning.          . benztropine (COGENTIN) 0.5 MG tablet   Oral   Take 1 tablet (0.5 mg total) by mouth 2 (two) times  daily.   60 tablet   1   . beta carotene w/minerals (OCUVITE) tablet   Oral   Take 1 tablet by mouth every morning.          . Biotin 5000 MCG CAPS   Oral   Take 1 capsule by mouth every morning.         Marland Kitchen buPROPion (WELLBUTRIN XL) 150 MG 24 hr tablet   Oral   Take 1 tablet (150 mg total) by mouth every morning.   30 tablet   2   . calcium carbonate (OS-CAL) 600 MG TABS   Oral   Take 600 mg by mouth 2 (two) times daily with a meal.          . Cholecalciferol (VITAMIN D) 2000 UNITS CAPS   Oral   Take 1 capsule by mouth every morning.          . clonazePAM (KLONOPIN) 0.5 MG tablet   Oral   Take 0.5 mg by mouth 3 (three) times daily as needed for anxiety.         . docusate sodium (STOOL SOFTENER) 100 MG capsule   Oral   Take 200 mg by mouth at bedtime.         . lamoTRIgine (LAMICTAL) 100 MG tablet   Oral   Take 1 tablet (100 mg total) by mouth 2  (two) times daily.   60 tablet   1     She is taking 100 mg twice a d ay. Don't give 25 m ...   . losartan (COZAAR) 100 MG tablet   Oral   Take 100 mg by mouth every morning.          Marland Kitchen omeprazole (PRILOSEC) 20 MG capsule   Oral   Take 40 mg by mouth every morning.          Marland Kitchen PARoxetine (PAXIL) 40 MG tablet   Oral   Take 60 mg by mouth every morning.         . risperiDONE (RISPERDAL) 0.25 MG tablet   Oral   Take 0.25 mg by mouth 3 (three) times daily. Takes at 8:00AM, 2:00PM, 5:00PM.         . risperiDONE (RISPERDAL) 1 MG tablet   Oral   Take 1 tablet (1 mg total) by mouth at bedtime.   30 tablet   2   . solifenacin (VESICARE) 10 MG tablet   Oral   Take 10 mg by mouth every morning.          . terazosin (HYTRIN) 2 MG capsule   Oral   Take 2 mg by mouth at bedtime.          . vitamin B-12 (CYANOCOBALAMIN) 1000 MCG tablet   Oral   Take 1,000 mcg by mouth daily.         . vitamin E 400 UNIT capsule   Oral   Take 1 capsule (400 Units total) by mouth 2 (two) times daily.   60 capsule   2   . acetaminophen (TYLENOL) 500 MG tablet   Oral   Take 1 tablet (500 mg total) by mouth every 6 (six) hours as needed for mild pain or moderate pain.   30 tablet   0    BP 149/69  Pulse 80  Temp(Src) 97.7 F (36.5 C) (Oral)  Resp 12  Ht 5\' 6"  (1.676 m)  Wt 224 lb (101.606 kg)  BMI 36.17 kg/m2  SpO2 98% Physical Exam  Nursing note and vitals reviewed. Constitutional: She appears well-developed and well-nourished. No distress. Cervical collar in place.  HENT:  Head: Normocephalic. Head is without raccoon's eyes, without Battle's sign, without abrasion, without laceration, without right periorbital erythema and without left periorbital erythema. Hair is normal.    Right Ear: External ear normal.  Left Ear: External ear normal.  Nose: Nose normal.  Mouth/Throat: Oropharynx is clear and moist. No oropharyngeal exudate.  Posterior scalp hematoma.   Eyes:  Conjunctivae and EOM are normal. Pupils are equal, round, and reactive to light.  Neck: Neck supple. No spinous process tenderness and no muscular tenderness present.  Cardiovascular: Normal rate, regular rhythm, normal heart sounds and intact distal pulses.   Pulmonary/Chest: Effort normal and breath sounds normal. No respiratory distress.  Abdominal: Soft. There is no tenderness.  Musculoskeletal:       Thoracic back: She exhibits no tenderness, no bony tenderness, no swelling, no edema, no deformity, no laceration, no pain, no spasm and normal pulse.       Lumbar back: She exhibits no tenderness, no bony tenderness, no swelling, no edema, no deformity, no laceration, no pain, no spasm and normal pulse.  Neurological: She is alert. She has normal strength. No cranial nerve deficit or sensory deficit. Gait normal. GCS eye subscore is 4. GCS verbal subscore is 5. GCS motor subscore is 6.  Patient is oriented to self, situation, and time only.  No pronator drift. Bilateral heel-knee-shin intact.  Skin: Skin is warm and dry. She is not diaphoretic.    ED Course  Procedures (including critical care time) Medications  acetaminophen (TYLENOL) tablet 500 mg (500 mg Oral Given 07/18/13 1008)    Labs Review Labs Reviewed  CBC - Abnormal; Notable for the following:    Hemoglobin 11.2 (*)    All other components within normal limits  COMPREHENSIVE METABOLIC PANEL - Abnormal; Notable for the following:    Glucose, Bld 126 (*)    BUN 37 (*)    Creatinine, Ser 1.61 (*)    Albumin 3.3 (*)    AST 71 (*)    ALT 71 (*)    Alkaline Phosphatase 127 (*)    Total Bilirubin 0.2 (*)    GFR calc non Af Amer 30 (*)    GFR calc Af Amer 35 (*)    All other components within normal limits  URINALYSIS, ROUTINE W REFLEX MICROSCOPIC - Abnormal; Notable for the following:    APPearance CLOUDY (*)    All other components within normal limits  CBG MONITORING, ED - Abnormal; Notable for the following:     Glucose-Capillary 118 (*)    All other components within normal limits  URINE CULTURE  TROPONIN I   Imaging Review Dg Chest 1 View  07/18/2013   CLINICAL DATA:  Fall.  EXAM: CHEST - 1 VIEW  COMPARISON:  DG THORACIC SPINE W/SWIMMERS dated 02/09/2013; DG RIBS UNILATERAL W/CHEST*R* dated 07/22/2012; DG CHEST 2 VIEW dated 12/30/2011  FINDINGS: The heart size and mediastinal contours are within normal limits. There is stable elevation/ eventration of the right hemidiaphragm. There is no evidence of pulmonary edema, consolidation, pneumothorax, nodule or pleural fluid. The visualized skeletal structures are unremarkable.  IMPRESSION: No active disease.   Electronically Signed   By: Irish Lack M.D.   On: 07/18/2013 09:46   Dg Lumbar Spine Complete  07/18/2013   CLINICAL DATA:  Fall with back pain.  EXAM: LUMBAR SPINE - COMPLETE 4+ VIEW  COMPARISON:  DG SACRUM/COCCYX  dated 12/19/2011  FINDINGS: Moderately advanced diffuse spondylosis of the lumbar spine present. The S1 level is transitional. No acute fracture or subluxation is identified. There is an associated mild rightward convex scoliosis. No bony lesions are seen.  IMPRESSION: No evidence of acute lumbar fracture. Moderately advanced and diffuse spondylosis present throughout the lumbar spine.   Electronically Signed   By: Irish LackGlenn  Yamagata M.D.   On: 07/18/2013 09:45   Ct Head Wo Contrast  07/18/2013   CLINICAL DATA:  Fall with headache with posterior head injury.  EXAM: CT HEAD WITHOUT CONTRAST  CT CERVICAL SPINE WITHOUT CONTRAST  TECHNIQUE: Multidetector CT imaging of the head and cervical spine was performed following the standard protocol without intravenous contrast. Multiplanar CT image reconstructions of the cervical spine were also generated.  COMPARISON:  CT HEAD W/O CM dated 11/29/2012 AND CERVICAL  FINDINGS: CT HEAD FINDINGS  There is a prominent scalp hematoma over the posterior vertex. No associated skull fracture. The brain demonstrates no  evidence of hemorrhage, infarction, edema, mass effect, extra-axial fluid collection, hydrocephalus or mass lesion. There is stable prominent small vessel disease in the periventricular white matter. Stable cortical atrophy present.  CT CERVICAL SPINE FINDINGS  The cervical spine shows normal alignment. There is no evidence of acute fracture or subluxation. No soft tissue swelling or hematoma is identified. Stable multilevel cervical spondylosis. No bony or soft tissue lesions are seen. The visualized airway is normally patent. Stable thyroid goiter with mild thyroid enlargement.  IMPRESSION: 1. Posterior scalp hematoma without evidence of skull fracture or brain injury. 2. Stable cervical spondylosis without fracture.   Electronically Signed   By: Irish LackGlenn  Yamagata M.D.   On: 07/18/2013 10:01   Ct Cervical Spine Wo Contrast  07/18/2013   CLINICAL DATA:  Fall with headache with posterior head injury.  EXAM: CT HEAD WITHOUT CONTRAST  CT CERVICAL SPINE WITHOUT CONTRAST  TECHNIQUE: Multidetector CT imaging of the head and cervical spine was performed following the standard protocol without intravenous contrast. Multiplanar CT image reconstructions of the cervical spine were also generated.  COMPARISON:  CT HEAD W/O CM dated 11/29/2012 AND CERVICAL  FINDINGS: CT HEAD FINDINGS  There is a prominent scalp hematoma over the posterior vertex. No associated skull fracture. The brain demonstrates no evidence of hemorrhage, infarction, edema, mass effect, extra-axial fluid collection, hydrocephalus or mass lesion. There is stable prominent small vessel disease in the periventricular white matter. Stable cortical atrophy present.  CT CERVICAL SPINE FINDINGS  The cervical spine shows normal alignment. There is no evidence of acute fracture or subluxation. No soft tissue swelling or hematoma is identified. Stable multilevel cervical spondylosis. No bony or soft tissue lesions are seen. The visualized airway is normally patent.  Stable thyroid goiter with mild thyroid enlargement.  IMPRESSION: 1. Posterior scalp hematoma without evidence of skull fracture or brain injury. 2. Stable cervical spondylosis without fracture.   Electronically Signed   By: Irish LackGlenn  Yamagata M.D.   On: 07/18/2013 10:01     EKG Interpretation None      MDM   Final diagnoses:  Fall  Scalp hematoma  Back pain    Filed Vitals:   07/18/13 1006  BP: 149/69  Pulse:   Temp: 97.7 F (36.5 C)  Resp: 12   9:44 AM Patient's family arrived at hospital and is able to confirm patient is at baseline.   Patient is a 73 year old female presenting [facility] the emergency department [facility] an [facility]. Patient  initially oriented to self, time, situation. Initially concerned for mental status change based on emergency department visit several months ago. Small posterior scalp hematoma appreciated on examination. No no focal deficits. No other physical exam abnormalities noted. EKG unremarkable. CT head and cervical spine scans obtained without abnormality. Chest x-ray and lumbar spine plain film without acute finding. Blood work largely unremarkable. Mild transaminitis noted on the abdominal exam is benign, will have patient have repeat labs by PCP as outpatient. BUN and creatinine stable from previous visits. Family arrived at hospital and confirm that patient is at her mental status baseline. Will discharge patient home to [facility]. Return precautions discussed. Patient and family is agreeable to plan. Patient d/w with Dr. Wilkie Aye, agrees with plan.    Lise Auer Chivas Notz, PA-C 07/18/13 1600

## 2013-07-18 NOTE — Discharge Instructions (Signed)
Please follow up with your primary care physician in 1-2 days. If you do not have one please call the [facility] and wellness Center number listed above. Please have your doctor recheck your liver enzymes at your follow up appointment to ensure they are back within the normal range as they were mildly elevated at your visit today. Please take Tylenol as prescribed for any pain. Please use Ice or Heat to help with pain as well. Please read all discharge instructions and return precautions.   Fall Prevention and Home Safety Falls cause injuries and can affect all age groups. It is possible to use preventive measures to significantly decrease the likelihood of falls. There are many simple measures which can make your home safer and prevent falls. OUTDOORS  Repair cracks and edges of walkways and driveways.  Remove high doorway thresholds.  Trim shrubbery on the main path into your home.  Have good outside lighting.  Clear walkways of tools, rocks, debris, and clutter.  Check that handrails are not broken and are securely fastened. Both sides of steps should have handrails.  Have leaves, snow, and ice cleared regularly.  Use sand or salt on walkways during winter months.  In the garage, clean up grease or oil spills. BATHROOM  Install night lights.  Install grab bars by the toilet and in the tub and shower.  Use non-skid mats or decals in the tub or shower.  Place a plastic non-slip stool in the shower to sit on, if needed.  Keep floors dry and clean up all water on the floor immediately.  Remove soap buildup in the tub or shower on a regular basis.  Secure bath mats with non-slip, double-sided rug tape.  Remove throw rugs and tripping hazards from the floors. BEDROOMS  Install night lights.  Make sure a bedside light is easy to reach.  Do not use oversized bedding.  Keep a telephone by your bedside.  Have a firm chair with side arms to use for getting  dressed.  Remove throw rugs and tripping hazards from the floor. KITCHEN  Keep handles on pots and pans turned toward the center of the stove. Use back burners when possible.  Clean up spills quickly and allow time for drying.  Avoid walking on wet floors.  Avoid hot utensils and knives.  Position shelves so they are not too high or low.  Place commonly used objects within easy reach.  If necessary, use a sturdy step stool with a grab bar when reaching.  Keep electrical cables out of the way.  Do not use floor polish or wax that makes floors slippery. If you must use wax, use non-skid floor wax.  Remove throw rugs and tripping hazards from the floor. STAIRWAYS  Never leave objects on stairs.  Place handrails on both sides of stairways and use them. Fix any loose handrails. Make sure handrails on both sides of the stairways are as long as the stairs.  Check carpeting to make sure it is firmly attached along stairs. Make repairs to worn or loose carpet promptly.  Avoid placing throw rugs at the top or bottom of stairways, or properly secure the rug with carpet tape to prevent slippage. Get rid of throw rugs, if possible.  Have an electrician put in a light switch at the top and bottom of the stairs. OTHER FALL PREVENTION TIPS  Wear low-heel or rubber-soled shoes that are supportive and fit well. Wear closed toe shoes.  When using a stepladder, make sure  it is fully opened and both spreaders are firmly locked. Do not climb a closed stepladder.  Add color or contrast paint or tape to grab bars and handrails in your home. Place contrasting color strips on first and last steps.  Learn and use mobility aids as needed. Install an electrical emergency response system.  Turn on lights to avoid dark areas. Replace light bulbs that burn out immediately. Get light switches that glow.  Arrange furniture to create clear pathways. Keep furniture in the same place.  Firmly attach  carpet with non-skid or double-sided tape.  Eliminate uneven floor surfaces.  Select a carpet pattern that does not visually hide the edge of steps.  Be aware of all pets. OTHER HOME SAFETY TIPS  Set the water temperature for 120 F (48.8 C).  Keep emergency numbers on or near the telephone.  Keep smoke detectors on every level of the home and near sleeping areas. Document Released: 04/19/2002 Document Revised: 10/29/2011 Document Reviewed: 07/19/2011 [facility] Patient Information 2014 [employer] [place]. Hematoma A hematoma is a collection of blood under the skin, in an organ, in a body space, in a joint space, or in other tissue. The blood can clot to form a lump that you can see and feel. The lump is often firm and may sometimes become sore and tender. Most hematomas get better in a few days to weeks. However, some hematomas may be serious and require medical care. Hematomas can range in size from very small to very large. CAUSES  A hematoma can be caused by a blunt or penetrating injury. It can also be caused by spontaneous leakage from a blood vessel under the skin. Spontaneous leakage from a blood vessel is more likely to occur in older people, especially those taking blood thinners. Sometimes, a hematoma can develop after certain medical procedures. SIGNS AND SYMPTOMS   A firm lump on the body.  Possible pain and tenderness in the area.  Bruising.Blue, dark blue, purple-red, or yellowish skin may appear at the site of the hematoma if the hematoma is close to the surface of the skin. For hematomas in deeper tissues or body spaces, the signs and symptoms may be subtle. For example, an intra-abdominal hematoma may cause abdominal pain, weakness, fainting, and shortness of breath. An intracranial hematoma may cause a headache or symptoms such as weakness, trouble speaking, or a change in consciousness. DIAGNOSIS  A hematoma can usually be diagnosed based on your medical history and a  physical exam. Imaging tests may be needed if your health care provider suspects a hematoma in deeper tissues or body spaces, such as the abdomen, head, or chest. These tests may include ultrasonography or a CT scan.  TREATMENT  Hematomas usually go away on their own over time. Rarely does the blood need to be drained out of the body. Large hematomas or those that may affect vital organs will sometimes need surgical drainage or monitoring. HOME CARE INSTRUCTIONS   Apply ice to the injured area:   Put ice in a plastic bag.   Place a towel between your skin and the bag.   Leave the ice on for 20 minutes, 2 3 times a day for the first 1 to 2 days.   After the first 2 days, switch to using warm compresses on the hematoma.   Elevate the injured area to help decrease pain and swelling. Wrapping the area with an elastic bandage may also be helpful. Compression helps to reduce swelling and promotes shrinking of  the hematoma. Make sure the bandage is not wrapped too tight.   If your hematoma is on a lower extremity and is painful, crutches may be helpful for a couple days.   Only take over-the-counter or prescription medicines as directed by your health care provider. SEEK IMMEDIATE MEDICAL CARE IF:   You have increasing pain, or your pain is not controlled with medicine.   You have a fever.   You have worsening swelling or discoloration.   Your skin over the hematoma breaks or starts bleeding.   Your hematoma is in your chest or abdomen and you have weakness, shortness of breath, or a change in consciousness.  Your hematoma is on your scalp (caused by a fall or injury) and you have a worsening headache or a change in alertness or consciousness. MAKE SURE YOU:   Understand these instructions.  Will watch your condition.  Will get help right away if you are not doing well or get worse. Document Released: 12/12/2003 Document Revised: 12/30/2012 Document Reviewed:  10/07/2012 [facility] Patient Information 2014 [place], [place].

## 2013-07-18 NOTE — ED Notes (Signed)
[facility]. [facility] on back beside bed. Patient alert but confused to month and siyuation. Patient c/o headache and lower back pain. Blood sugar per EMS 148. Patient has c-collar in place.

## 2013-07-18 NOTE — ED Notes (Signed)
Per pt's family, the pt is at her baseline mentation

## 2013-07-19 LAB — URINE CULTURE

## 2013-07-20 NOTE — Progress Notes (Signed)
ED Antimicrobial Stewardship Positive Culture Follow Up   Sinclair ShipFrances C Yusupov is an 73 y.o. female [facility] with a chief complaint of  Chief Complaint  Patient presents with  . Fall    Recent Results (from the past 720 hour(s))  URINE CULTURE     Status: None   Collection Time    07/18/13  9:12 AM      Result Value Ref Range Status   Specimen Description URINE, CATHETERIZED   Final   Special Requests NONE   Final   Culture  Setup Time     Final   Value: 07/18/2013 23:14     [facility] at [facility]   Colony Count     Final   Value: >=100,000 COLONIES/ML     Performed at [facility]   Culture     Final   Value: VIRIDANS STREPTOCOCCUS     Performed at [facility]   Report Status 07/19/2013 FINAL   Final    []  Treated with , organism resistant to prescribed antimicrobial [x]  Patient discharged originally without antimicrobial agent and treatment may be indicated  Recommendation: Please fax results to patient's facility [facility] [place] Suggest repeat UA and urine culture. If treatment is warranted, suggest Fosfomycin 3g PO x 1 (patient has multiple antibiotic allergies).   ED Provider: Arthor CaptainAbigail Harris, PA-C   Cleon DewDulaney, [place] 07/20/2013, 11:46 AM Infectious Diseases Pharmacist Phone# [phone]

## 2013-07-21 NOTE — ED Notes (Signed)
[facility]

## 2013-07-22 ENCOUNTER — Telehealth (HOSPITAL_COMMUNITY): Payer: Self-pay | Admitting: *Deleted

## 2013-07-23 NOTE — Telephone Encounter (Signed)
[place] to [place], pt more agitated. I [place]

## 2013-08-09 ENCOUNTER — Telehealth (HOSPITAL_COMMUNITY): Payer: Self-pay | Admitting: *Deleted

## 2013-08-09 ENCOUNTER — Encounter (HOSPITAL_COMMUNITY): Payer: Self-pay | Admitting: Psychiatry

## 2013-08-09 ENCOUNTER — Ambulatory Visit (INDEPENDENT_AMBULATORY_CARE_PROVIDER_SITE_OTHER): Payer: Medicare Other | Admitting: Psychiatry

## 2013-08-09 VITALS — BP 160/90 | Ht 66.0 in | Wt 220.0 lb

## 2013-08-09 DIAGNOSIS — F411 Generalized anxiety disorder: Secondary | ICD-10-CM | POA: Diagnosis not present

## 2013-08-09 DIAGNOSIS — F323 Major depressive disorder, single episode, severe with psychotic features: Secondary | ICD-10-CM | POA: Diagnosis not present

## 2013-08-09 DIAGNOSIS — F5105 Insomnia due to other mental disorder: Secondary | ICD-10-CM

## 2013-08-09 DIAGNOSIS — F331 Major depressive disorder, recurrent, moderate: Secondary | ICD-10-CM

## 2013-08-09 DIAGNOSIS — F09 Unspecified mental disorder due to known physiological condition: Secondary | ICD-10-CM

## 2013-08-09 MED ORDER — LAMOTRIGINE 100 MG PO TABS: 100.0000 mg | ORAL_TABLET | Freq: Two times a day (BID) | ORAL | Status: DC

## 2013-08-09 MED ORDER — RISPERIDONE 0.25 MG PO TABS: 0.2500 mg | ORAL_TABLET | Freq: Three times a day (TID) | ORAL | Status: DC

## 2013-08-09 MED ORDER — CLONAZEPAM 0.5 MG PO TABS: 0.5000 mg | ORAL_TABLET | Freq: Three times a day (TID) | ORAL | Status: DC | PRN

## 2013-08-09 MED ORDER — PAROXETINE HCL 40 MG PO TABS: 60.0000 mg | ORAL_TABLET | ORAL | Status: DC

## 2013-08-09 MED ORDER — RISPERIDONE 1 MG PO TABS: 1.0000 mg | ORAL_TABLET | Freq: Every day | ORAL | Status: DC

## 2013-08-09 MED ORDER — BUPROPION HCL ER (XL) 150 MG PO TB24: 150.0000 mg | ORAL_TABLET | ORAL | Status: DC

## 2013-08-09 NOTE — Progress Notes (Signed)
Patient ID: Amy Clay, female   DOB: Jan 30, 1941, [age] y.o.   MRN: [id number] Patient ID: Amy Clay, female   DOB: Jan 30, 1941, 73 y.o.   MRN: [id number] Patient ID: Amy Clay, female   DOB: Jan 30, 1941, 73 y.o.   MRN: [id number] Patient ID: Amy Clay, female   DOB: Jan 30, 1941, 73 y.o.   MRN: [id number] Patient ID: Amy Clay, female   DOB: Jan 30, 1941, 73 y.o.   MRN: [id number] [facility] [id number] Progress Note Amy Clay MRN: [id number] DOB: Jan 30, 1941 Age: 73 y.o.  Date: 08/09/2013  Chief Complaint  Patient presents with  . Anxiety  . Depression  . Follow-up   History of presenting illness Patient is 73 year old Caucasian female who came with her sister for her followup appointment. She and her sister live together in the family farm outside of [place]. Neither one has ever been married or have had children. The patient worked until the early 80s when she began to have difficulties with her nerves.  Currently the patient is still struggling with her mental and physical health. She was admitted last year to a psychiatric hospital in [place] because she was anxious nervous and depressed. Last time Dr. Lolly MustacheArfeen increased her Risperdal which is helped somewhat. She can't walk and it's not clear why much of it seems to be anxiety based and she seems to have a fear of falling. She's also recently developed possible renal failure and is can be referred to a nephrologist. She was on lithium for a number of years. We do not have her leak recent records from her primary care physician who is outside of [place].  Currently the patient is still depressed despite being on 60 mg of Paxil a day. She has no energy. She sits and watches TV all the time and worries constantly. She's not suicidal but her mood is low. Her memory has been failing her and she gets confused in the afternoons and agitated. A recent brain CT indicated a atrophy and significant white  matter disease which is consistent with early dementia.  The patient returns after 2 months. She's now living in [facility]. She was admitted to the hospital last month because she was constantly falling. She went home for a few days and started falling again. Her sister and will social worker from the home health agency daughter the best for her to live in assisted living. She was doing better there for the first 3 weeks but this past week she's been depressed and tearful. She's here today with her sister Amy GrossKay and her aide Amy SpryGail. She constantly states that she wants to go home. She's tearful and angry with her sister for putting her in assisted living.  Patient returns after one month with her aide. For whatever reason she seems to be doing better. The Dir. of the nursing home called me about 2 weeks ago and stated she was worse. I called [facility] to arrange an admission. The nursing home never followed through on sending records claiming there was "a miscommunication" she's also on both clonazepam and Xanax in the chart even though she is only supposed to be on clonazepam. She seems brighter and happier today and more coherent  Past psychiatric history Patient is seeing psychiatrist since 1997 in this office.  Her [facility] psychiatric admission was at [facility] after having suicidal thoughts.  She denies any history of suicidal attempt however endorse history of hopeless feeling.  She  is diagnosed with major depressive disorder. She she was very stable on lithium until her creatinine went up and lithium was discontinued.  We had recently tried Lamictal and the dose has been increased recently.  In the past we had tried BuSpar which was discontinued at hospital.  She has another psychiatric inpatient treatment in 90s. She admitted history of mania, depression and psychosis.  Social history Patient lives with her sister.  She has no children.  Her husband  died in nursing home.  Patient has no other family.  Medical history Patient was recently admitted due to dehydration and UTI.  Patient has history of arthritis blood pressure and chronic pain. Her primary care physician is Dr. Phillips Odor at [facility].  She takes medication for her blood pressure and arthritis.  She had history of jerking movements, fall and generalized pain. Her last blood work was done on 02/14/2012 which shows anemia, her creatinine was 1.33.  She was given antibiotic result UTI.  Her WBC count was 10.4.  Family History family history includes Anxiety disorder in her paternal aunt and paternal uncle; Arthritis in an other family member; Asthma in an other family member; Bipolar disorder in her cousin; Diabetes in an other family member. There is no history of Colon cancer.  Review of Systems  Constitutional: Positive for malaise/fatigue.  Skin: Negative.   Neurological: Positive for tremors, weakness and headaches.  Psychiatric/Behavioral: Positive for memory loss. Negative for suicidal ideas, hallucinations and substance abuse. The patient is nervous/anxious. The patient does not have insomnia.      Mental status examination Patient is casually dressed and fairly groomed. She is walking with her walker   She described her mood as better.  Her voice is high-pitched and soft.  .Her affect is brighter.  Her attention and concentration is poor.  She has poverty of thought content.  She doesn't auditory or visual hallucination.  There were no paranoia or delusion obsession present at this time.  Her fund of knowledge is okay.  There are no tremors and shakes in her hand.  She's alert and oriented x3.  Her insight judgment and impulse control is okay.  Lab Results:  Results for orders placed during the hospital encounter of 07/18/13 (from the past 8736 hour(s))  CBC   Collection Time    07/18/13  8:49 AM      Result Value Ref Range   WBC 7.0  4.0 - 10.5 K/uL    RBC 3.89  3.87 - 5.11 MIL/uL   Hemoglobin 11.2 (*) 12.0 - 15.0 g/dL   HCT 16.1  09.6 - 04.5 %   MCV 94.1  78.0 - 100.0 fL   MCH 28.8  26.0 - 34.0 pg   MCHC 30.6  30.0 - 36.0 g/dL   RDW 40.9  81.1 - 91.4 %   Platelets 230  150 - 400 K/uL  COMPREHENSIVE METABOLIC PANEL   Collection Time    07/18/13  8:49 AM      Result Value Ref Range   Sodium 141  137 - 147 mEq/L   Potassium 4.5  3.7 - 5.3 mEq/L   Chloride 104  96 - 112 mEq/L   CO2 27  19 - 32 mEq/L   Glucose, Bld 126 (*) 70 - 99 mg/dL   BUN 37 (*) 6 - 23 mg/dL   Creatinine, Ser 7.82 (*) 0.50 - 1.10 mg/dL   Calcium 95.6  8.4 - 21.3 mg/dL   Total Protein 7.2  6.0 -  8.3 g/dL   Albumin 3.3 (*) 3.5 - 5.2 g/dL   AST 71 (*) 0 - 37 U/L   ALT 71 (*) 0 - 35 U/L   Alkaline Phosphatase 127 (*) 39 - 117 U/L   Total Bilirubin 0.2 (*) 0.3 - 1.2 mg/dL   GFR calc non Af Amer 30 (*) >90 mL/min   GFR calc Af Amer 35 (*) >90 mL/min  TROPONIN I   Collection Time    07/18/13  8:49 AM      Result Value Ref Range   Troponin I <0.30  <0.30 ng/mL  CBG MONITORING, ED   Collection Time    07/18/13  8:58 AM      Result Value Ref Range   Glucose-Capillary 118 (*) 70 - 99 mg/dL  URINE CULTURE   Collection Time    07/18/13  9:12 AM      Result Value Ref Range   Specimen Description URINE, CATHETERIZED     Special Requests NONE     Culture  Setup Time       Value: 07/18/2013 23:14     Performed at [facility]       Value: >=100,000 COLONIES/ML     Performed at [facility]   Culture       Value: VIRIDANS STREPTOCOCCUS     Performed at [facility]   Report Status 07/19/2013 FINAL    URINALYSIS, ROUTINE W REFLEX MICROSCOPIC   Collection Time    07/18/13  9:12 AM      Result Value Ref Range   Color, Urine YELLOW  YELLOW   APPearance CLOUDY (*) CLEAR   Specific Gravity, Urine 1.010  1.005 - 1.030   pH 6.5  5.0 - 8.0   Glucose, UA NEGATIVE  NEGATIVE mg/dL   Hgb urine dipstick NEGATIVE  NEGATIVE    Bilirubin Urine NEGATIVE  NEGATIVE   Ketones, ur NEGATIVE  NEGATIVE mg/dL   Protein, ur NEGATIVE  NEGATIVE mg/dL   Urobilinogen, UA 0.2  0.0 - 1.0 mg/dL   Nitrite NEGATIVE  NEGATIVE   Leukocytes, UA NEGATIVE  NEGATIVE  Results for orders placed during the hospital encounter of 02/14/13 (from the past 8736 hour(s))  CBC   Collection Time    02/14/13 10:07 AM      Result Value Ref Range   WBC 10.1  4.0 - 10.5 K/uL   RBC 3.73 (*) 3.87 - 5.11 MIL/uL   Hemoglobin 11.0 (*) 12.0 - 15.0 g/dL   HCT 16.1 (*) 09.6 - 04.5 %   MCV 93.8  78.0 - 100.0 fL   MCH 29.5  26.0 - 34.0 pg   MCHC 31.4  30.0 - 36.0 g/dL   RDW 40.9  81.1 - 91.4 %   Platelets 204  150 - 400 K/uL  BASIC METABOLIC PANEL   Collection Time    02/14/13 10:07 AM      Result Value Ref Range   Sodium 140  135 - 145 mEq/L   Potassium 4.2  3.5 - 5.1 mEq/L   Chloride 102  96 - 112 mEq/L   CO2 27  19 - 32 mEq/L   Glucose, Bld 146 (*) 70 - 99 mg/dL   BUN 22  6 - 23 mg/dL   Creatinine, Ser 7.82 (*) 0.50 - 1.10 mg/dL   Calcium 95.6  8.4 - 21.3 mg/dL   GFR calc non Af Amer 31 (*) >90 mL/min   GFR calc Af Amer 36 (*) >  90 mL/min  URINALYSIS, ROUTINE W REFLEX MICROSCOPIC   Collection Time    02/14/13 10:20 AM      Result Value Ref Range   Color, Urine YELLOW  YELLOW   APPearance CLEAR  CLEAR   Specific Gravity, Urine 1.015  1.005 - 1.030   pH 6.5  5.0 - 8.0   Glucose, UA NEGATIVE  NEGATIVE mg/dL   Hgb urine dipstick MODERATE (*) NEGATIVE   Bilirubin Urine NEGATIVE  NEGATIVE   Ketones, ur NEGATIVE  NEGATIVE mg/dL   Protein, ur 30 (*) NEGATIVE mg/dL   Urobilinogen, UA 0.2  0.0 - 1.0 mg/dL   Nitrite NEGATIVE  NEGATIVE   Leukocytes, UA LARGE (*) NEGATIVE  URINE MICROSCOPIC-ADD ON   Collection Time    02/14/13 10:20 AM      Result Value Ref Range   WBC, UA TOO NUMEROUS TO COUNT  <3 WBC/hpf   RBC / HPF TOO NUMEROUS TO COUNT  <3 RBC/hpf   Bacteria, UA MANY (*) RARE  URINE CULTURE   Collection Time    02/14/13 10:30 AM       Result Value Ref Range   Specimen Description URINE, CATHETERIZED     Special Requests NONE     Culture  Setup Time       Value: 02/14/2013 21:47     Performed at [facility]       Value: >=100,000 COLONIES/ML     Performed at [facility]   Culture       Value: GROUP B STREP(S.AGALACTIAE)ISOLATED     Note: TESTING AGAINST S. AGALACTIAE NOT ROUTINELY PERFORMED DUE TO PREDICTABILITY OF AMP/PEN/VAN SUSCEPTIBILITY.     Performed at [facility]   Report Status 02/16/2013 FINAL    Results for orders placed during the hospital encounter of 02/09/13 (from the past 8736 hour(s))  CBC WITH DIFFERENTIAL   Collection Time    02/09/13  7:41 PM      Result Value Ref Range   WBC 5.7  4.0 - 10.5 K/uL   RBC 3.87  3.87 - 5.11 MIL/uL   Hemoglobin 11.2 (*) 12.0 - 15.0 g/dL   HCT 62.9  52.8 - 41.3 %   MCV 93.8  78.0 - 100.0 fL   MCH 28.9  26.0 - 34.0 pg   MCHC 30.9  30.0 - 36.0 g/dL   RDW 24.4  01.0 - 27.2 %   Platelets 211  150 - 400 K/uL   Neutrophils Relative % 72  43 - 77 %   Neutro Abs 4.1  1.7 - 7.7 K/uL   Lymphocytes Relative 18  12 - 46 %   Lymphs Abs 1.0  0.7 - 4.0 K/uL   Monocytes Relative 9  3 - 12 %   Monocytes Absolute 0.5  0.1 - 1.0 K/uL   Eosinophils Relative 1  0 - 5 %   Eosinophils Absolute 0.1  0.0 - 0.7 K/uL   Basophils Relative 0  0 - 1 %   Basophils Absolute 0.0  0.0 - 0.1 K/uL  BASIC METABOLIC PANEL   Collection Time    02/09/13  7:41 PM      Result Value Ref Range   Sodium 137  135 - 145 mEq/L   Potassium 4.8  3.5 - 5.1 mEq/L   Chloride 101  96 - 112 mEq/L   CO2 28  19 - 32 mEq/L   Glucose, Bld 95  70 - 99 mg/dL   BUN  28 (*) 6 - 23 mg/dL   Creatinine, Ser 1.61 (*) 0.50 - 1.10 mg/dL   Calcium 09.6  8.4 - 04.5 mg/dL   GFR calc non Af Amer 29 (*) >90 mL/min   GFR calc Af Amer 34 (*) >90 mL/min  URINE CULTURE   Collection Time    02/09/13  9:11 PM      Result Value Ref Range   Specimen Description URINE, CLEAN CATCH      Special Requests NONE     Culture  Setup Time       Value: 02/09/2013 21:25     Performed at [facility]       Value: >=100,000 COLONIES/ML     Performed at [facility]   Culture       Value: Multiple bacterial morphotypes present, none predominant. Suggest appropriate recollection if clinically indicated.     Performed at [facility]   Report Status 02/11/2013 FINAL    URINALYSIS, ROUTINE W REFLEX MICROSCOPIC   Collection Time    02/09/13  9:11 PM      Result Value Ref Range   Color, Urine YELLOW  YELLOW   APPearance CLEAR  CLEAR   Specific Gravity, Urine 1.010  1.005 - 1.030   pH 6.0  5.0 - 8.0   Glucose, UA NEGATIVE  NEGATIVE mg/dL   Hgb urine dipstick NEGATIVE  NEGATIVE   Bilirubin Urine NEGATIVE  NEGATIVE   Ketones, ur NEGATIVE  NEGATIVE mg/dL   Protein, ur NEGATIVE  NEGATIVE mg/dL   Urobilinogen, UA 0.2  0.0 - 1.0 mg/dL   Nitrite NEGATIVE  NEGATIVE   Leukocytes, UA SMALL (*) NEGATIVE  URINE MICROSCOPIC-ADD ON   Collection Time    02/09/13  9:11 PM      Result Value Ref Range   Squamous Epithelial / LPF MANY (*) RARE   WBC, UA 7-10  <3 WBC/hpf   RBC / HPF 0-2  <3 RBC/hpf   Bacteria, UA MANY (*) RARE   Casts HYALINE CASTS (*) NEGATIVE  TSH   Collection Time    02/09/13 11:06 PM      Result Value Ref Range   TSH 0.567  0.350 - 4.500 uIU/mL  URIC ACID   Collection Time    02/09/13 11:06 PM      Result Value Ref Range   Uric Acid, Serum 5.7  2.4 - 7.0 mg/dL  URINE CULTURE   Collection Time    02/10/13  1:04 AM      Result Value Ref Range   Specimen Description URINE, CATHETERIZED     Special Requests NONE     Culture  Setup Time       Value: 02/09/2013 01:20     Performed at [facility]       Value: 50,000 COLONIES/ML     Performed at [facility]   Culture       Value: GROUP B STREP(S.AGALACTIAE)ISOLATED     Note: TESTING AGAINST S. AGALACTIAE NOT ROUTINELY PERFORMED DUE TO  PREDICTABILITY OF AMP/PEN/VAN SUSCEPTIBILITY.     Performed at [facility]   Report Status 02/11/2013 FINAL    URINALYSIS, ROUTINE W REFLEX MICROSCOPIC   Collection Time    02/10/13  1:04 AM      Result Value Ref Range   Color, Urine YELLOW  YELLOW   APPearance CLEAR  CLEAR   Specific Gravity, Urine 1.010  1.005 - 1.030  pH 6.5  5.0 - 8.0   Glucose, UA NEGATIVE  NEGATIVE mg/dL   Hgb urine dipstick NEGATIVE  NEGATIVE   Bilirubin Urine NEGATIVE  NEGATIVE   Ketones, ur NEGATIVE  NEGATIVE mg/dL   Protein, ur NEGATIVE  NEGATIVE mg/dL   Urobilinogen, UA 0.2  0.0 - 1.0 mg/dL   Nitrite NEGATIVE  NEGATIVE   Leukocytes, UA NEGATIVE  NEGATIVE  CBC   Collection Time    02/10/13  4:59 AM      Result Value Ref Range   WBC 5.1  4.0 - 10.5 K/uL   RBC 3.73 (*) 3.87 - 5.11 MIL/uL   Hemoglobin 10.8 (*) 12.0 - 15.0 g/dL   HCT 16.1 (*) 09.6 - 04.5 %   MCV 93.8  78.0 - 100.0 fL   MCH 29.0  26.0 - 34.0 pg   MCHC 30.9  30.0 - 36.0 g/dL   RDW 40.9  81.1 - 91.4 %   Platelets 213  150 - 400 K/uL  COMPREHENSIVE METABOLIC PANEL   Collection Time    02/10/13  4:59 AM      Result Value Ref Range   Sodium 141  135 - 145 mEq/L   Potassium 4.3  3.5 - 5.1 mEq/L   Chloride 108  96 - 112 mEq/L   CO2 27  19 - 32 mEq/L   Glucose, Bld 83  70 - 99 mg/dL   BUN 25 (*) 6 - 23 mg/dL   Creatinine, Ser 7.82 (*) 0.50 - 1.10 mg/dL   Calcium 9.9  8.4 - 95.6 mg/dL   Total Protein 6.3  6.0 - 8.3 g/dL   Albumin 3.0 (*) 3.5 - 5.2 g/dL   AST 16  0 - 37 U/L   ALT 7  0 - 35 U/L   Alkaline Phosphatase 113  39 - 117 U/L   Total Bilirubin 0.3  0.3 - 1.2 mg/dL   GFR calc non Af Amer 32 (*) >90 mL/min   GFR calc Af Amer 37 (*) >90 mL/min  VITAMIN B12   Collection Time    02/10/13  1:01 PM      Result Value Ref Range   Vitamin B-12 1682 (*) 211 - 911 pg/mL  FOLATE   Collection Time    02/10/13  1:01 PM      Result Value Ref Range   Folate >20.0    IRON AND TIBC   Collection Time    02/10/13  1:01 PM       Result Value Ref Range   Iron 33 (*) 42 - 135 ug/dL   TIBC 213 (*) 086 - 578 ug/dL   Saturation Ratios 18 (*) 20 - 55 %   UIBC 149  125 - 400 ug/dL  FERRITIN   Collection Time    02/10/13  1:01 PM      Result Value Ref Range   Ferritin 51  10 - 291 ng/mL  RETICULOCYTES   Collection Time    02/10/13  1:01 PM      Result Value Ref Range   Retic Ct Pct 0.8  0.4 - 3.1 %   RBC. 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 She is better I will also change to clonazepam only even though this was done before.  Recommend if she does not see any improvement in call us back immediately.  Discussed the risk and benefits of the medication in detail.    Time spent 25 minutes.  More than 50% of the time spent and psychoeducation, counseling and coordination of care. She'll return in to 2 months  MEDICATIONS this encounter: Meds ordered this encounter  Medications  . clonazePAM (KLONOPIN) 0.5 MG tablet    Sig: Take 1 tablet (0.5 mg total) by mouth 3 (three) times daily as needed for anxiety.    Dispense:  90 tablet    Refill:  2  . lamoTRIgine (LAMICTAL) 100 MG  tablet    Sig: Take 1 tablet (100 mg total) by mouth 2 (two) times daily.    Dispense:  60 tablet    Refill:  1  . buPROPion (WELLBUTRIN XL) 150 MG 24 hr tablet    Sig: Take 1 tablet (150 mg total) by mouth every morning.    Dispense:  30 tablet    Refill:  2  . risperiDONE (RISPERDAL) 1 MG tablet    Sig: Take 1 tablet (1 mg total) by mouth at bedtime.    Dispense:  30 tablet    Refill:  2  . risperiDONE (RISPERDAL) 0.25 MG tablet    Sig: Take 1 tablet (0.25 mg total) by mouth 3 (three) times daily. Takes at 8:00AM, 2:00PM, 5:00PM.    Dispense:  90 tablet    Refill:  2  . PARoxetine (PAXIL) 40 MG tablet    Sig: Take 1.5 tablets (60 mg total) by mouth every morning.    Dispense:  45 tablet    Refill:  2    Medical Decision Making Problem Points:  Established problem, stable/improving (1), Established problem, worsening (2), New problem, with additional work-up planned (4), Review of last therapy session (1) and Review of psycho-social stressors (1) Data Points:  Review of medication regiment & side effects (2) Review of new medications or change in dosage (2)  I certify that outpatient services furnished can reasonably be expected to improve the patient's condition.   Diannia Ruder, MD

## 2013-08-10 NOTE — Telephone Encounter (Signed)
completed

## 2013-08-12 ENCOUNTER — Telehealth (HOSPITAL_COMMUNITY): Payer: Self-pay | Admitting: *Deleted

## 2013-08-12 NOTE — Telephone Encounter (Signed)
noted 

## 2013-10-08 ENCOUNTER — Ambulatory Visit (INDEPENDENT_AMBULATORY_CARE_PROVIDER_SITE_OTHER): Payer: Medicare Other | Admitting: Psychiatry

## 2013-10-08 ENCOUNTER — Encounter (HOSPITAL_COMMUNITY): Payer: Self-pay | Admitting: Psychiatry

## 2013-10-08 VITALS — BP 140/80 | Ht 66.0 in | Wt 226.0 lb

## 2013-10-08 DIAGNOSIS — F09 Unspecified mental disorder due to known physiological condition: Secondary | ICD-10-CM | POA: Diagnosis not present

## 2013-10-08 DIAGNOSIS — F3289 Other specified depressive episodes: Secondary | ICD-10-CM

## 2013-10-08 DIAGNOSIS — F411 Generalized anxiety disorder: Secondary | ICD-10-CM

## 2013-10-08 DIAGNOSIS — F5105 Insomnia due to other mental disorder: Secondary | ICD-10-CM

## 2013-10-08 DIAGNOSIS — F331 Major depressive disorder, recurrent, moderate: Secondary | ICD-10-CM

## 2013-10-08 DIAGNOSIS — F29 Unspecified psychosis not due to a substance or known physiological condition: Secondary | ICD-10-CM | POA: Diagnosis not present

## 2013-10-08 DIAGNOSIS — F329 Major depressive disorder, single episode, unspecified: Secondary | ICD-10-CM | POA: Diagnosis not present

## 2013-10-08 MED ORDER — BUPROPION HCL ER (XL) 150 MG PO TB24: 150.0000 mg | ORAL_TABLET | ORAL | Status: DC

## 2013-10-08 MED ORDER — RISPERIDONE 1 MG PO TABS: 1.0000 mg | ORAL_TABLET | Freq: Every day | ORAL | Status: DC

## 2013-10-08 MED ORDER — RISPERIDONE 0.25 MG PO TABS: 0.2500 mg | ORAL_TABLET | Freq: Three times a day (TID) | ORAL | Status: DC

## 2013-10-08 MED ORDER — PAROXETINE HCL 40 MG PO TABS: 60.0000 mg | ORAL_TABLET | ORAL | Status: DC

## 2013-10-08 MED ORDER — CLONAZEPAM 0.5 MG PO TABS: 0.5000 mg | ORAL_TABLET | Freq: Three times a day (TID) | ORAL | Status: DC | PRN

## 2013-10-08 MED ORDER — BENZTROPINE MESYLATE 0.5 MG PO TABS: 0.5000 mg | ORAL_TABLET | Freq: Two times a day (BID) | ORAL | Status: DC

## 2013-10-08 MED ORDER — LAMOTRIGINE 100 MG PO TABS: 100.0000 mg | ORAL_TABLET | Freq: Two times a day (BID) | ORAL | Status: DC

## 2013-10-08 NOTE — Progress Notes (Signed)
Patient ID: Amy Clay, female   DOB: 1941/04/02, 73 y.o.   MRN: [id number] Patient ID: Amy Clay, female   DOB: October 16, 1940, 73 y.o.   MRN: [id number] Patient ID: Amy Clay, female   DOB: 12/13/1940, 73 y.o.   MRN: [id number] Patient ID: Amy Clay, female   DOB: 05-10-1941, 73 y.o.   MRN: [id number] Patient ID: Amy Clay, female   DOB: Jul 06, 1940, 73 y.o.   MRN: [id number] Patient ID: [name], female   DOB: 1940/11/21, 73 y.o.   MRN: [id number] [facility] [id number] Progress Note Amy Clay MRN: [id number] DOB: Dec 27, 1940 Age: 73 y.o.  Date: 10/08/2013  Chief Complaint  Patient presents with  . Anxiety  . Depression  . Follow-up   History of presenting illness Patient is 73 year old Caucasian female who came with her sister for her followup appointment. She and her sister live together in the family farm outside of [place]. Neither one has ever been married or have had children. The patient worked until the early 80s when she began to have difficulties with her nerves.  Currently the patient is still struggling with her mental and physical health. She was admitted last year to a psychiatric hospital in [place] because she was anxious nervous and depressed. Last time Dr. Lolly Mustache increased her Risperdal which is helped somewhat. She can't walk and it's not clear why much of it seems to be anxiety based and she seems to have a fear of falling. She's also recently developed possible renal failure and is can be referred to a nephrologist. She was on lithium for a number of years. We do not have her leak recent records from her primary care physician who is outside of [place].  Currently the patient is still depressed despite being on 60 mg of Paxil a day. She has no energy. She sits and watches TV all the time and worries constantly. She's not suicidal but her mood is low. Her memory has been failing her and she gets confused in the  afternoons and agitated. A recent brain CT indicated a atrophy and significant white matter disease which is consistent with early dementia.  The patient returns after 2 months. She's now living in the [facility] [place] assisted care facility. She was admitted to the hospital last month because she was constantly falling. She went home for a few days and started falling again. Her sister and will social worker from the home health agency daughter the best for her to live in assisted living. She was doing better there for the first 3 weeks but this past week she's been depressed and tearful. She's here today with her sister [name] and her aide Dondra Spry. She constantly states that she wants to go home. She's tearful and angry with her sister for putting her in assisted living.  Patient returns after 2 months with her aide from the nursing home. She is doing much better for whatever reason. Her mood is improved. Her energy is better and she is practicing her walking. She is less anxious and is sleeping well at night. She's starting to make friends in the nursing home. She mentions that her sisters having back problems but she doesn't seem overly obsessed about her like she did before. Her thought process is quite organized and she answers questions appropriately  Past psychiatric history Patient is seeing psychiatrist since 1997 in this office.  Her last psychiatric admission was at [facility] after having suicidal thoughts.  She denies any history of suicidal attempt however endorse history of hopeless feeling.  She is diagnosed with major depressive disorder. She she was very stable on lithium until her creatinine went up and lithium was discontinued.  We had recently tried Lamictal and the dose has been increased recently.  In the past we had tried BuSpar which was discontinued at hospital.  She has another psychiatric inpatient treatment in 90s. She admitted history of mania, depression and  psychosis.  Social history Patient lives with her sister.  She has no children.  Her husband died in nursing home.  Patient has no other family.  Medical history Patient was recently admitted due to dehydration and UTI.  Patient has history of arthritis blood pressure and chronic pain. Her primary care physician is Dr. Phillips Odor at [facility].  She takes medication for her blood pressure and arthritis.  She had history of jerking movements, fall and generalized pain. Her last blood work was done on 02/14/2012 which shows anemia, her creatinine was 1.33.  She was given antibiotic result UTI.  Her WBC count was 10.4.  Family History family history includes Anxiety disorder in her paternal aunt and paternal uncle; Arthritis in an other family member; Asthma in an other family member; Bipolar disorder in her cousin; Diabetes in an other family member. There is no history of Colon cancer.  Review of Systems  Constitutional: Positive for malaise/fatigue.  Skin: Negative.   Neurological: Positive for tremors, weakness and headaches.  Psychiatric/Behavioral: Positive for memory loss. Negative for suicidal ideas, hallucinations and substance abuse. The patient is nervous/anxious. The patient does not have insomnia.      Mental status examination Patient is casually dressed and fairly groomed. She is walking with her walker   She described her mood as better.  Her voice is high-pitched and soft.  .Her affect is bright.  Her attention and concentration are improved.  She expresses herself well .  She doesn't have auditory or visual hallucination.  There were no paranoia or delusion obsession present at this time.  Her fund of knowledge is okay.  There are no tremors and shakes in her hand.  She's alert and oriented x3.  Her insight judgment and impulse control is okay.  Lab Results:  Results for orders placed during the hospital encounter of 07/18/13 (from the past 8736 hour(s))  CBC    Collection Time    07/18/13  8:49 AM      Result Value Ref Range   WBC 7.0  4.0 - 10.5 K/uL   RBC 3.89  3.87 - 5.11 MIL/uL   Hemoglobin 11.2 (*) 12.0 - 15.0 g/dL   HCT 16.1  09.6 - 04.5 %   MCV 94.1  78.0 - 100.0 fL   MCH 28.8  26.0 - 34.0 pg   MCHC 30.6  30.0 - 36.0 g/dL   RDW 40.9  81.1 - 91.4 %   Platelets 230  150 - 400 K/uL  COMPREHENSIVE METABOLIC PANEL   Collection Time    07/18/13  8:49 AM      Result Value Ref Range   Sodium 141  137 - 147 mEq/L   Potassium 4.5  3.7 - 5.3 mEq/L   Chloride 104  96 - 112 mEq/L   CO2 27  19 - 32 mEq/L   Glucose, Bld 126 (*) 70 - 99 mg/dL   BUN 37 (*) 6 - 23 mg/dL   Creatinine, Ser 7.82 (*) 0.50 - 1.10 mg/dL  Calcium 10.0  8.4 - 10.5 mg/dL   Total Protein 7.2  6.0 - 8.3 g/dL   Albumin 3.3 (*) 3.5 - 5.2 g/dL   AST 71 (*) 0 - 37 U/L   ALT 71 (*) 0 - 35 U/L   Alkaline Phosphatase 127 (*) 39 - 117 U/L   Total Bilirubin 0.2 (*) 0.3 - 1.2 mg/dL   GFR calc non Af Amer 30 (*) >90 mL/min   GFR calc Af Amer 35 (*) >90 mL/min  TROPONIN I   Collection Time    07/18/13  8:49 AM      Result Value Ref Range   Troponin I <0.30  <0.30 ng/mL  CBG MONITORING, ED   Collection Time    07/18/13  8:58 AM      Result Value Ref Range   Glucose-Capillary 118 (*) 70 - 99 mg/dL  URINE CULTURE   Collection Time    07/18/13  9:12 AM      Result Value Ref Range   Specimen Description URINE, CATHETERIZED     Special Requests NONE     Culture  Setup Time       Value: 07/18/2013 23:14     Performed at [facility] Count       Value: >=100,000 COLONIES/ML     Performed at [facility]   Culture       Value: VIRIDANS STREPTOCOCCUS     Performed at [facility]   Report Status 07/19/2013 FINAL    URINALYSIS, ROUTINE W REFLEX MICROSCOPIC   Collection Time    07/18/13  9:12 AM      Result Value Ref Range   Color, Urine YELLOW  YELLOW   APPearance CLOUDY (*) CLEAR   Specific Gravity, Urine 1.010  1.005 - 1.030   pH 6.5   5.0 - 8.0   Glucose, UA NEGATIVE  NEGATIVE mg/dL   Hgb urine dipstick NEGATIVE  NEGATIVE   Bilirubin Urine NEGATIVE  NEGATIVE   Ketones, ur NEGATIVE  NEGATIVE mg/dL   Protein, ur NEGATIVE  NEGATIVE mg/dL   Urobilinogen, UA 0.2  0.0 - 1.0 mg/dL   Nitrite NEGATIVE  NEGATIVE   Leukocytes, UA NEGATIVE  NEGATIVE  Results for orders placed during the hospital encounter of 02/14/13 (from the past 8736 hour(s))  CBC   Collection Time    02/14/13 10:07 AM      Result Value Ref Range   WBC 10.1  4.0 - 10.5 K/uL   RBC 3.73 (*) 3.87 - 5.11 MIL/uL   Hemoglobin 11.0 (*) 12.0 - 15.0 g/dL   HCT 84.1 (*) 66.0 - 63.0 %   MCV 93.8  78.0 - 100.0 fL   MCH 29.5  26.0 - 34.0 pg   MCHC 31.4  30.0 - 36.0 g/dL   RDW 16.0  10.9 - 32.3 %   Platelets 204  150 - 400 K/uL  BASIC METABOLIC PANEL   Collection Time    02/14/13 10:07 AM      Result Value Ref Range   Sodium 140  135 - 145 mEq/L   Potassium 4.2  3.5 - 5.1 mEq/L   Chloride 102  96 - 112 mEq/L   CO2 27  19 - 32 mEq/L   Glucose, Bld 146 (*) 70 - 99 mg/dL   BUN 22  6 - 23 mg/dL   Creatinine, Ser 5.57 (*) 0.50 - 1.10 mg/dL   Calcium 32.2  8.4 - 02.5 mg/dL   GFR  calc non Af Amer 31 (*) >90 mL/min   GFR calc Af Amer 36 (*) >90 mL/min  URINALYSIS, ROUTINE W REFLEX MICROSCOPIC   Collection Time    02/14/13 10:20 AM      Result Value Ref Range   Color, Urine YELLOW  YELLOW   APPearance CLEAR  CLEAR   Specific Gravity, Urine 1.015  1.005 - 1.030   pH 6.5  5.0 - 8.0   Glucose, UA NEGATIVE  NEGATIVE mg/dL   Hgb urine dipstick MODERATE (*) NEGATIVE   Bilirubin Urine NEGATIVE  NEGATIVE   Ketones, ur NEGATIVE  NEGATIVE mg/dL   Protein, ur 30 (*) NEGATIVE mg/dL   Urobilinogen, UA 0.2  0.0 - 1.0 mg/dL   Nitrite NEGATIVE  NEGATIVE   Leukocytes, UA LARGE (*) NEGATIVE  URINE MICROSCOPIC-ADD ON   Collection Time    02/14/13 10:20 AM      Result Value Ref Range   WBC, UA TOO NUMEROUS TO COUNT  <3 WBC/hpf   RBC / HPF TOO NUMEROUS TO COUNT  <3 RBC/hpf    Bacteria, UA MANY (*) RARE  URINE CULTURE   Collection Time    02/14/13 10:30 AM      Result Value Ref Range   Specimen Description URINE, CATHETERIZED     Special Requests NONE     Culture  Setup Time       Value: 02/14/2013 21:47     Performed at [facility]   [facility] Count       Value: >=100,000 COLONIES/ML     Performed at [facility]   Culture       Value: GROUP B STREP(S.AGALACTIAE)ISOLATED     Note: TESTING AGAINST S. AGALACTIAE NOT ROUTINELY PERFORMED DUE TO PREDICTABILITY OF AMP/PEN/VAN SUSCEPTIBILITY.     Performed at [facility]   Report Status 02/16/2013 FINAL    Results for orders placed during the hospital encounter of 02/09/13 (from the past 8736 hour(s))  CBC WITH DIFFERENTIAL   Collection Time    02/09/13  7:41 PM      Result Value Ref Range   WBC 5.7  4.0 - 10.5 K/uL   RBC 3.87  3.87 - 5.11 MIL/uL   Hemoglobin 11.2 (*) 12.0 - 15.0 g/dL   HCT 16.136.3  09.636.0 - 04.546.0 %   MCV 93.8  78.0 - 100.0 fL   MCH 28.9  26.0 - 34.0 pg   MCHC 30.9  30.0 - 36.0 g/dL   RDW 40.914.4  81.111.5 - 91.415.5 %   Platelets 211  150 - 400 K/uL   Neutrophils Relative % 72  43 - 77 %   Neutro Abs 4.1  1.7 - 7.7 K/uL   Lymphocytes Relative 18  12 - 46 %   Lymphs Abs 1.0  0.7 - 4.0 K/uL   Monocytes Relative 9  3 - 12 %   Monocytes Absolute 0.5  0.1 - 1.0 K/uL   Eosinophils Relative 1  0 - 5 %   Eosinophils Absolute 0.1  0.0 - 0.7 K/uL   Basophils Relative 0  0 - 1 %   Basophils Absolute 0.0  0.0 - 0.1 K/uL  BASIC METABOLIC PANEL   Collection Time    02/09/13  7:41 PM      Result Value Ref Range   Sodium 137  135 - 145 mEq/L   Potassium 4.8  3.5 - 5.1 mEq/L   Chloride 101  96 - 112 mEq/L   CO2 28  19 -  32 mEq/L   Glucose, Bld 95  70 - 99 mg/dL   BUN 28 (*) 6 - 23 mg/dL   Creatinine, Ser 4.54 (*) 0.50 - 1.10 mg/dL   Calcium 09.8  8.4 - 11.9 mg/dL   GFR calc non Af Amer 29 (*) >90 mL/min   GFR calc Af Amer 34 (*) >90 mL/min  URINE CULTURE   Collection Time     02/09/13  9:11 PM      Result Value Ref Range   Specimen Description URINE, CLEAN CATCH     Special Requests NONE     Culture  Setup Time       Value: 02/09/2013 21:25     Performed at [facility] Count       Value: >=100,000 COLONIES/ML     Performed at [facility]   Culture       Value: Multiple bacterial morphotypes present, none predominant. Suggest appropriate recollection if clinically indicated.     Performed at [facility]   Report Status 02/11/2013 FINAL    URINALYSIS, ROUTINE W REFLEX MICROSCOPIC   Collection Time    02/09/13  9:11 PM      Result Value Ref Range   Color, Urine YELLOW  YELLOW   APPearance CLEAR  CLEAR   Specific Gravity, Urine 1.010  1.005 - 1.030   pH 6.0  5.0 - 8.0   Glucose, UA NEGATIVE  NEGATIVE mg/dL   Hgb urine dipstick NEGATIVE  NEGATIVE   Bilirubin Urine NEGATIVE  NEGATIVE   Ketones, ur NEGATIVE  NEGATIVE mg/dL   Protein, ur NEGATIVE  NEGATIVE mg/dL   Urobilinogen, UA 0.2  0.0 - 1.0 mg/dL   Nitrite NEGATIVE  NEGATIVE   Leukocytes, UA SMALL (*) NEGATIVE  URINE MICROSCOPIC-ADD ON   Collection Time    02/09/13  9:11 PM      Result Value Ref Range   Squamous Epithelial / LPF MANY (*) RARE   WBC, UA 7-10  <3 WBC/hpf   RBC / HPF 0-2  <3 RBC/hpf   Bacteria, UA MANY (*) RARE   Casts HYALINE CASTS (*) NEGATIVE  TSH   Collection Time    02/09/13 11:06 PM      Result Value Ref Range   TSH 0.567  0.350 - 4.500 uIU/mL  URIC ACID   Collection Time    02/09/13 11:06 PM      Result Value Ref Range   Uric Acid, Serum 5.7  2.4 - 7.0 mg/dL  URINE CULTURE   Collection Time    02/10/13  1:04 AM      Result Value Ref Range   Specimen Description URINE, CATHETERIZED     Special Requests NONE     Culture  Setup Time       Value: 02/09/2013 01:20     Performed at [facility] Count       Value: 50,000 COLONIES/ML     Performed at [facility]   Culture       Value: GROUP B  STREP(S.AGALACTIAE)ISOLATED     Note: TESTING AGAINST S. AGALACTIAE NOT ROUTINELY PERFORMED DUE TO PREDICTABILITY OF AMP/PEN/VAN SUSCEPTIBILITY.     Performed at [facility]   Report Status 02/11/2013 FINAL    URINALYSIS, ROUTINE W REFLEX MICROSCOPIC   Collection Time    02/10/13  1:04 AM      Result Value Ref Range   Color, Urine YELLOW  YELLOW  APPearance CLEAR  CLEAR   Specific Gravity, Urine 1.010  1.005 - 1.030   pH 6.5  5.0 - 8.0   Glucose, UA NEGATIVE  NEGATIVE mg/dL   Hgb urine dipstick NEGATIVE  NEGATIVE   Bilirubin Urine NEGATIVE  NEGATIVE   Ketones, ur NEGATIVE  NEGATIVE mg/dL   Protein, ur NEGATIVE  NEGATIVE mg/dL   Urobilinogen, UA 0.2  0.0 - 1.0 mg/dL   Nitrite NEGATIVE  NEGATIVE   Leukocytes, UA NEGATIVE  NEGATIVE  CBC   Collection Time    02/10/13  4:59 AM      Result Value Ref Range   WBC 5.1  4.0 - 10.5 K/uL   RBC 3.73 (*) 3.87 - 5.11 MIL/uL   Hemoglobin 10.8 (*) 12.0 - 15.0 g/dL   HCT 16.1 (*) 09.6 - 04.5 %   MCV 93.8  78.0 - 100.0 fL   MCH 29.0  26.0 - 34.0 pg   MCHC 30.9  30.0 - 36.0 g/dL   RDW 40.9  81.1 - 91.4 %   Platelets 213  150 - 400 K/uL  COMPREHENSIVE METABOLIC PANEL   Collection Time    02/10/13  4:59 AM      Result Value Ref Range   Sodium 141  135 - 145 mEq/L   Potassium 4.3  3.5 - 5.1 mEq/L   Chloride 108  96 - 112 mEq/L   CO2 27  19 - 32 mEq/L   Glucose, Bld 83  70 - 99 mg/dL   BUN 25 (*) 6 - 23 mg/dL   Creatinine, Ser 7.82 (*) 0.50 - 1.10 mg/dL   Calcium 9.9  8.4 - 95.6 mg/dL   Total Protein 6.3  6.0 - 8.3 g/dL   Albumin 3.0 (*) 3.5 - 5.2 g/dL   AST 16  0 - 37 U/L   ALT 7  0 - 35 U/L   Alkaline Phosphatase 113  39 - 117 U/L   Total Bilirubin 0.3  0.3 - 1.2 mg/dL   GFR calc non Af Amer 32 (*) >90 mL/min   GFR calc Af Amer 37 (*) >90 mL/min  VITAMIN B12   Collection Time    02/10/13  1:01 PM      Result Value Ref Range   Vitamin B-12 1682 (*) 211 - 911 pg/mL  FOLATE   Collection Time    02/10/13  1:01 PM       Result Value Ref Range   Folate >20.0    IRON AND TIBC   Collection Time    02/10/13  1:01 PM      Result Value Ref Range   Iron 33 (*) 42 - 135 ug/dL   TIBC 213 (*) 086 - 578 ug/dL   Saturation Ratios 18 (*) 20 - 55 %   UIBC 149  125 - 400 ug/dL  FERRITIN   Collection Time    02/10/13  1:01 PM      Result Value Ref Range   Ferritin 51  10 - 291 ng/mL  RETICULOCYTES   Collection Time    02/10/13  1:01 PM      Result Value Ref Range   Retic Ct Pct 0.8  0.4 - 3.1 %   RBC. 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 She is better so her current medications will be.  Discussed the risk and benefits of the medication in detail.    Time spent 25 minutes.  More than 50% of the time spent and psychoeducation, counseling and coordination of care. She'll return in to 3 months  MEDICATIONS this encounter: Meds ordered this encounter  Medications  . lamoTRIgine (LAMICTAL) 100 MG tablet    Sig: Take 1 tablet (100 mg total) by mouth 2 (two) times daily.    Dispense:  60 tablet    Refill:  1  . buPROPion (WELLBUTRIN XL) 150 MG 24 hr tablet    Sig: Take 1  tablet (150 mg total) by mouth every morning.    Dispense:  30 tablet    Refill:  2  . risperiDONE (RISPERDAL) 0.25 MG tablet    Sig: Take 1 tablet (0.25 mg total) by mouth 3 (three) times daily. Takes at 8:00AM, 2:00PM, 5:00PM.    Dispense:  90 tablet    Refill:  2  . risperiDONE (RISPERDAL) 1 MG tablet    Sig: Take 1 tablet (1 mg total) by mouth at bedtime.    Dispense:  30 tablet    Refill:  2  . PARoxetine (PAXIL) 40 MG tablet    Sig: Take 1.5 tablets (60 mg total) by mouth every morning.    Dispense:  45 tablet    Refill:  2  . benztropine (COGENTIN) 0.5 MG tablet    Sig: Take 1 tablet (0.5 mg total) by mouth 2 (two) times daily.    Dispense:  60 tablet    Refill:  1  . clonazePAM (KLONOPIN) 0.5 MG tablet    Sig: Take 1 tablet (0.5 mg total) by mouth 3 (three) times daily as needed for anxiety.    Dispense:  90 tablet    Refill:  2    Medical Decision Making Problem Points:  Established problem, stable/improving (1), Established problem, worsening (2), New problem, with additional work-up planned (4), Review of last therapy session (1) and Review of psycho-social stressors (1) Data Points:  Review of medication regiment & side effects (2) Review of new medications or change in dosage (2)  I certify that outpatient services furnished can reasonably be expected to improve the patient's condition.   Amy Ruder, MD

## 2013-10-30 ENCOUNTER — Encounter (HOSPITAL_COMMUNITY): Payer: Self-pay | Admitting: Emergency Medicine

## 2013-10-30 ENCOUNTER — Emergency Department (HOSPITAL_COMMUNITY): Payer: Medicare Other

## 2013-10-30 ENCOUNTER — Emergency Department (HOSPITAL_COMMUNITY)
Admission: EM | Admit: 2013-10-30 | Discharge: 2013-10-31 | Disposition: A | Discharge status: Skilled Nursing Facility | Payer: Medicare Other | Attending: Emergency Medicine | Admitting: Emergency Medicine

## 2013-10-30 DIAGNOSIS — Z880 Allergy status to penicillin: Secondary | ICD-10-CM | POA: Diagnosis not present

## 2013-10-30 DIAGNOSIS — Y929 Unspecified place or not applicable: Secondary | ICD-10-CM | POA: Insufficient documentation

## 2013-10-30 DIAGNOSIS — Z79899 Other long term (current) drug therapy: Secondary | ICD-10-CM | POA: Insufficient documentation

## 2013-10-30 DIAGNOSIS — S0120XA Unspecified open wound of nose, initial encounter: Secondary | ICD-10-CM | POA: Diagnosis not present

## 2013-10-30 DIAGNOSIS — F319 Bipolar disorder, unspecified: Secondary | ICD-10-CM | POA: Insufficient documentation

## 2013-10-30 DIAGNOSIS — F411 Generalized anxiety disorder: Secondary | ICD-10-CM | POA: Diagnosis not present

## 2013-10-30 DIAGNOSIS — S0993XA Unspecified injury of face, initial encounter: Secondary | ICD-10-CM | POA: Insufficient documentation

## 2013-10-30 DIAGNOSIS — R51 Headache: Secondary | ICD-10-CM | POA: Diagnosis not present

## 2013-10-30 DIAGNOSIS — M542 Cervicalgia: Secondary | ICD-10-CM | POA: Diagnosis not present

## 2013-10-30 DIAGNOSIS — S199XXA Unspecified injury of neck, initial encounter: Secondary | ICD-10-CM

## 2013-10-30 DIAGNOSIS — W010XXA Fall on same level from slipping, tripping and stumbling without subsequent striking against object, initial encounter: Secondary | ICD-10-CM | POA: Insufficient documentation

## 2013-10-30 DIAGNOSIS — Z7982 Long term (current) use of aspirin: Secondary | ICD-10-CM | POA: Insufficient documentation

## 2013-10-30 DIAGNOSIS — IMO0002 Reserved for concepts with insufficient information to code with codable children: Secondary | ICD-10-CM | POA: Diagnosis not present

## 2013-10-30 DIAGNOSIS — S0990XA Unspecified injury of head, initial encounter: Secondary | ICD-10-CM | POA: Diagnosis not present

## 2013-10-30 DIAGNOSIS — S41109A Unspecified open wound of unspecified upper arm, initial encounter: Secondary | ICD-10-CM | POA: Diagnosis not present

## 2013-10-30 DIAGNOSIS — R030 Elevated blood-pressure reading, without diagnosis of hypertension: Secondary | ICD-10-CM | POA: Insufficient documentation

## 2013-10-30 DIAGNOSIS — S41009A Unspecified open wound of unspecified shoulder, initial encounter: Secondary | ICD-10-CM | POA: Diagnosis not present

## 2013-10-30 DIAGNOSIS — M129 Arthropathy, unspecified: Secondary | ICD-10-CM | POA: Diagnosis not present

## 2013-10-30 DIAGNOSIS — S0181XA Laceration without foreign body of other part of head, initial encounter: Secondary | ICD-10-CM

## 2013-10-30 DIAGNOSIS — S0180XA Unspecified open wound of other part of head, initial encounter: Secondary | ICD-10-CM | POA: Diagnosis not present

## 2013-10-30 DIAGNOSIS — Y9301 Activity, walking, marching and hiking: Secondary | ICD-10-CM | POA: Insufficient documentation

## 2013-10-30 MED ORDER — LIDOCAINE HCL (PF) 1 % IJ SOLN
INTRAMUSCULAR | Status: AC
  Administered 2013-10-31
  Filled 2013-10-30: qty 5

## 2013-10-30 MED ORDER — POVIDONE-IODINE 10 % EX SOLN
CUTANEOUS | Status: AC
Start: 2013-10-30 — End: 2013-10-31
  Administered 2013-10-31
  Filled 2013-10-30: qty 118

## 2013-10-30 NOTE — ED Provider Notes (Signed)
CSN: [id number]     Arrival date & time 10/30/13  2133 History   None   This chart was scribed for Amy Lennert, MD by Marica Otter, ED Scribe. This patient was seen in room APA07/APA07 and the patient's care was started at 10:15 PM.  Chief Complaint  Patient presents with  . Fall   Patient is a 73 y.o. female presenting with fall. The history is provided by the patient. No language interpreter was used.  Fall This is a new problem. The current episode started 3 to 5 hours ago. The problem occurs rarely. The problem has been resolved. Associated symptoms include headaches. Pertinent negatives include no chest pain and no abdominal pain. Nothing aggravates the symptoms. Nothing relieves the symptoms.   HPI Comments: Amy Clay is a 73 y.o. female, who resides in assisted living facility, who presents to the Emergency Department complaining of a fall sustained earlier this evening when pt was walking with her walker, tripped and fell. Pt also complains of associated laceration to the bridge of the nose, multiple abrasions to the lower and upper extremities, and bruises. Pt also complains of associated HA, facial pain and neck pain. Pt rates her facial pain a 5 out of 10. Pt denies LOC.     Past Medical History  Diagnosis Date  . High blood pressure   . Arthritis   . Depression   . Anxiety   . Hemorrhoids   . Hyperlipidemia   . Bipolar 1 disorder    Past Surgical History  Procedure Laterality Date  . Back surgery  03/16/87  . Cholecystectomy  08/15/93  . Partial hysterectomy  04/04/95  . Bladder tacked  04/04/95  . Ankle surgery  04/28/97    right ankle fracture  . Wrist surgery  02/24/09    right wrist fracture  . Colonoscopy  10/26/2002    Dr. Karilyn Cota- small external hemorrhoids otherwise normal   . Breast biopsy  05/16/95    right side  . Savory dilation  10/31/2011    Procedure: SAVORY DILATION;  Surgeon: Corbin Ade, MD;  Location: [facility];  Service: Endoscopy;   Laterality: N/A;  Elease Hashimoto dilation  10/31/2011    Procedure: Elease Hashimoto DILATION;  Surgeon: Corbin Ade, MD;  Location: [facility];  Service: Endoscopy;  Laterality: N/A;  . Abdominal hysterectomy     Family History  Problem Relation Age of Onset  . Arthritis    . Asthma    . Diabetes    . Colon cancer Neg Hx   . Anxiety disorder Paternal Aunt   . Anxiety disorder Paternal Uncle   . Bipolar disorder Cousin    History  Substance Use Topics  . Smoking status: Never Smoker   . Smokeless tobacco: Never Used  . Alcohol Use: No   OB History   Grav Para Term Preterm Abortions TAB SAB Ect Mult Living            0     Review of Systems  Constitutional: Negative for appetite change and fatigue.  HENT: Negative for congestion, ear discharge and sinus pressure.   Eyes: Negative for discharge.  Respiratory: Negative for cough.   Cardiovascular: Negative for chest pain.  Gastrointestinal: Negative for abdominal pain and diarrhea.  Genitourinary: Negative for frequency and hematuria.  Musculoskeletal: Positive for neck pain. Negative for back pain.       Facial pain  Skin: Positive for wound (laceration to bridge of nose, multiple abrasions  in upper and lower extremities, and bruises ). Negative for rash.  Neurological: Positive for headaches. Negative for seizures.  Psychiatric/Behavioral: Negative for hallucinations.      Allergies  Benzodiazepines; Ciprofloxacin; Cephalexin; and Penicillins  Home Medications   Prior to Admission medications   Medication Sig Start Date End Date Taking? Authorizing Provider  aspirin EC 81 MG tablet Take 81 mg by mouth every morning.    Yes Historical Provider, MD  benztropine (COGENTIN) 0.5 MG tablet Take 1 tablet (0.5 mg total) by mouth 2 (two) times daily. 10/08/13 10/08/14 Yes Diannia Rudereborah Ross, MD  beta carotene w/minerals (OCUVITE) tablet Take 1 tablet by mouth every morning.    Yes Historical Provider, MD  Biotin 5000 MCG CAPS Take 1 capsule  by mouth every morning.   Yes Historical Provider, MD  buPROPion (WELLBUTRIN XL) 150 MG 24 hr tablet Take 1 tablet (150 mg total) by mouth every morning. 10/08/13 10/08/14 Yes Diannia Rudereborah Ross, MD  calcium carbonate (OS-CAL) 600 MG TABS Take 600 mg by mouth 2 (two) times daily with a meal.    Yes Historical Provider, MD  Cholecalciferol (VITAMIN D) 2000 UNITS CAPS Take 1 capsule by mouth every morning.    Yes Historical Provider, MD  clonazePAM (KLONOPIN) 0.5 MG tablet Take 1 tablet (0.5 mg total) by mouth 3 (three) times daily as needed for anxiety. 10/08/13  Yes Diannia Rudereborah Ross, MD  docusate sodium (STOOL SOFTENER) 100 MG capsule Take 200 mg by mouth at bedtime.   Yes Historical Provider, MD  lamoTRIgine (LAMICTAL) 100 MG tablet Take 1 tablet (100 mg total) by mouth 2 (two) times daily. 10/08/13  Yes Diannia Rudereborah Ross, MD  losartan (COZAAR) 100 MG tablet Take 100 mg by mouth every morning.  10/04/11  Yes Historical Provider, MD  omeprazole (PRILOSEC) 20 MG capsule Take 40 mg by mouth every morning.  09/19/10  Yes Historical Provider, MD  PARoxetine (PAXIL) 40 MG tablet Take 60 mg by mouth at bedtime.   Yes Historical Provider, MD  risperiDONE (RISPERDAL) 0.25 MG tablet Take 1 tablet (0.25 mg total) by mouth 3 (three) times daily. Takes at 8:00AM, 2:00PM, 5:00PM. 10/08/13  Yes Diannia Rudereborah Ross, MD  risperiDONE (RISPERDAL) 1 MG tablet Take 1 tablet (1 mg total) by mouth at bedtime. 10/08/13  Yes Diannia Rudereborah Ross, MD  solifenacin (VESICARE) 10 MG tablet Take 10 mg by mouth every morning.    Yes Historical Provider, MD  terazosin (HYTRIN) 2 MG capsule Take 2 mg by mouth at bedtime.    Yes Historical Provider, MD  vitamin B-12 (CYANOCOBALAMIN) 1000 MCG tablet Take 1,000 mcg by mouth daily.   Yes Historical Provider, MD  acetaminophen (TYLENOL) 500 MG tablet Take 1,000 mg by mouth every 6 (six) hours as needed for pain.    Historical Provider, MD   Triage Vitals: BP 133/56  Pulse 79  Temp(Src) 97.4 F (36.3 C) (Oral)  Resp 18   Ht 5\' 6"  (1.676 m)  Wt 226 lb (102.513 kg)  BMI 36.49 kg/m2  SpO2 93% Physical Exam  Constitutional: She is oriented to person, place, and time. She appears well-developed.  HENT:  Head: Normocephalic.  mild tenderness posterior neck   Eyes: Conjunctivae and EOM are normal. No scleral icterus.  Neck: Neck supple. No thyromegaly present.  Cardiovascular: Normal rate and regular rhythm.  Exam reveals no gallop and no friction rub.   No murmur heard. Pulmonary/Chest: No stridor. She has no wheezes. She has no rales. She exhibits no tenderness.  Abdominal: She exhibits  no distension. There is no tenderness. There is no rebound.  Musculoskeletal: Normal range of motion. She exhibits no edema.  Lymphadenopathy:    She has no cervical adenopathy.  Neurological: She is oriented to person, place, and time. She exhibits normal muscle tone. Coordination normal.  Skin: No rash noted. There is erythema.  Bruising around right orbit and swelling; 3cm laceration to the nose   Psychiatric: She has a normal mood and affect. Her behavior is normal.    ED Course  LACERATION REPAIR Date/Time: 10/31/2013 12:07 AM Performed by: Taevyn Hausen L Authorized by: Malyah Ohlrich L Comments: 3 cm lac to nose.  Lido no epi.  Used to numb.  6   5-0 nylon sutures use to close lac.  Pt tolerated procedure will   (including critical care time) DIAGNOSTIC STUDIES: Oxygen Saturation is 93% on RA, adequate by my interpretation.    COORDINATION OF CARE: 10:18 PM-Discussed treatment plan which includes imaging with pt at bedside and pt agreed to plan.   Labs Review Labs Reviewed - No data to display  Imaging Review No results found.   EKG Interpretation None      MDM   Final diagnoses:  None   The chart was scribed for me under my direct supervision.  I personally performed the history, physical, and medical decision making and all procedures in the evaluation of this patient.Amy Clay.     Joffre Lucks L  Domnick Chervenak, MD 10/31/13 [phone]

## 2013-10-30 NOTE — ED Notes (Signed)
Pt states she was walking with her walker tonight and tripped and fell. Pt has a laceration on the bridge of nose, multiple skin tears, and bruises.

## 2013-10-31 DIAGNOSIS — Z7401 Bed confinement status: Secondary | ICD-10-CM | POA: Diagnosis not present

## 2013-10-31 DIAGNOSIS — M2550 Pain in unspecified joint: Secondary | ICD-10-CM | POA: Diagnosis not present

## 2013-10-31 NOTE — Discharge Instructions (Signed)
Clean area twice a day with soap and water.  Sutures out in 5-7 days

## 2013-10-31 NOTE — ED Notes (Signed)
Dr Estell HarpinZammit sutured laceration to bridge of pt nose. Sterile technique used.

## 2013-11-05 DIAGNOSIS — Z4802 Encounter for removal of sutures: Secondary | ICD-10-CM | POA: Diagnosis not present

## 2013-11-05 DIAGNOSIS — Z6841 Body Mass Index (BMI) 40.0 and over, adult: Secondary | ICD-10-CM | POA: Diagnosis not present

## 2013-11-05 DIAGNOSIS — T148XXA Other injury of unspecified body region, initial encounter: Secondary | ICD-10-CM | POA: Diagnosis not present

## 2013-11-17 ENCOUNTER — Emergency Department (HOSPITAL_COMMUNITY)
Admission: EM | Admit: 2013-11-17 | Discharge: 2013-11-18 | Disposition: A | Discharge status: Home / Self Care | Payer: Medicare Other | Attending: Emergency Medicine | Admitting: Emergency Medicine

## 2013-11-17 ENCOUNTER — Emergency Department (HOSPITAL_COMMUNITY): Payer: Medicare Other

## 2013-11-17 ENCOUNTER — Encounter (HOSPITAL_COMMUNITY): Payer: Self-pay | Admitting: Emergency Medicine

## 2013-11-17 DIAGNOSIS — Y9210 Unspecified residential institution as the place of occurrence of the external cause: Secondary | ICD-10-CM | POA: Insufficient documentation

## 2013-11-17 DIAGNOSIS — Z862 Personal history of diseases of the blood and blood-forming organs and certain disorders involving the immune mechanism: Secondary | ICD-10-CM | POA: Diagnosis not present

## 2013-11-17 DIAGNOSIS — T1490XA Injury, unspecified, initial encounter: Secondary | ICD-10-CM | POA: Diagnosis not present

## 2013-11-17 DIAGNOSIS — S1093XA Contusion of unspecified part of neck, initial encounter: Principal | ICD-10-CM

## 2013-11-17 DIAGNOSIS — Z79899 Other long term (current) drug therapy: Secondary | ICD-10-CM | POA: Insufficient documentation

## 2013-11-17 DIAGNOSIS — F411 Generalized anxiety disorder: Secondary | ICD-10-CM | POA: Diagnosis not present

## 2013-11-17 DIAGNOSIS — Y92129 Unspecified place in nursing home as the place of occurrence of the external cause: Secondary | ICD-10-CM

## 2013-11-17 DIAGNOSIS — Z7982 Long term (current) use of aspirin: Secondary | ICD-10-CM | POA: Diagnosis not present

## 2013-11-17 DIAGNOSIS — Y939 Activity, unspecified: Secondary | ICD-10-CM | POA: Insufficient documentation

## 2013-11-17 DIAGNOSIS — S5010XA Contusion of unspecified forearm, initial encounter: Secondary | ICD-10-CM | POA: Diagnosis not present

## 2013-11-17 DIAGNOSIS — S0003XA Contusion of scalp, initial encounter: Secondary | ICD-10-CM | POA: Diagnosis not present

## 2013-11-17 DIAGNOSIS — Z880 Allergy status to penicillin: Secondary | ICD-10-CM | POA: Insufficient documentation

## 2013-11-17 DIAGNOSIS — M129 Arthropathy, unspecified: Secondary | ICD-10-CM | POA: Insufficient documentation

## 2013-11-17 DIAGNOSIS — S0993XA Unspecified injury of face, initial encounter: Secondary | ICD-10-CM | POA: Diagnosis not present

## 2013-11-17 DIAGNOSIS — R296 Repeated falls: Secondary | ICD-10-CM | POA: Insufficient documentation

## 2013-11-17 DIAGNOSIS — I10 Essential (primary) hypertension: Secondary | ICD-10-CM | POA: Diagnosis not present

## 2013-11-17 DIAGNOSIS — S064X9A Epidural hemorrhage with loss of consciousness of unspecified duration, initial encounter: Secondary | ICD-10-CM | POA: Diagnosis not present

## 2013-11-17 DIAGNOSIS — Z8639 Personal history of other endocrine, nutritional and metabolic disease: Secondary | ICD-10-CM | POA: Insufficient documentation

## 2013-11-17 DIAGNOSIS — F319 Bipolar disorder, unspecified: Secondary | ICD-10-CM | POA: Insufficient documentation

## 2013-11-17 DIAGNOSIS — S0010XA Contusion of unspecified eyelid and periocular area, initial encounter: Secondary | ICD-10-CM | POA: Insufficient documentation

## 2013-11-17 DIAGNOSIS — S0083XA Contusion of other part of head, initial encounter: Principal | ICD-10-CM | POA: Insufficient documentation

## 2013-11-17 DIAGNOSIS — S0190XA Unspecified open wound of unspecified part of head, initial encounter: Secondary | ICD-10-CM | POA: Diagnosis not present

## 2013-11-17 DIAGNOSIS — S064XAA Epidural hemorrhage with loss of consciousness status unknown, initial encounter: Secondary | ICD-10-CM | POA: Diagnosis not present

## 2013-11-17 DIAGNOSIS — S0093XA Contusion of unspecified part of head, initial encounter: Secondary | ICD-10-CM

## 2013-11-17 DIAGNOSIS — Y9389 Activity, other specified: Secondary | ICD-10-CM | POA: Insufficient documentation

## 2013-11-17 DIAGNOSIS — W19XXXA Unspecified fall, initial encounter: Secondary | ICD-10-CM

## 2013-11-17 NOTE — ED Notes (Signed)
MD at bedside. 

## 2013-11-17 NOTE — ED Provider Notes (Signed)
CSN: [id number]     Arrival date & time 11/17/13  2215 History  This chart was scribed for Ward Givens, MD by Quintella Reichert, ED scribe.  This patient was seen in room APA19/APA19 and the patient's care was started at 10:32 PM.   Chief Complaint  Patient presents with  . Fall    The history is provided by the patient. No language interpreter was used.    HPI Comments: Amy Clay is a 73 y.o. female brought in from [facility] to the Emergency Department complaining of a fall that occurred pta.  Pt states she was putting on pants when she fell.  She is not amnesic to the event but cannot explain exactly how she fell.  She denies LOC.  She was found on the floor by staff at her nursing facility.  Currently she complains of mild-to-moderate pain to the back of her head with associated swelling.  She denies pain or injury to any other area including chest, abdomen, neck, arms, or legs.  Pt ambulates with a walker at baseline and was using it today when she fell.  PCP Dr Phillips Odor  Past Medical History  Diagnosis Date  . High blood pressure   . Arthritis   . Depression   . Anxiety   . Hemorrhoids   . Hyperlipidemia   . Bipolar 1 disorder     Past Surgical History  Procedure Laterality Date  . Back surgery  03/16/87  . Cholecystectomy  08/15/93  . Partial hysterectomy  04/04/95  . Bladder tacked  04/04/95  . Ankle surgery  04/28/97    right ankle fracture  . Wrist surgery  02/24/09    right wrist fracture  . Colonoscopy  10/26/2002    Dr. Karilyn Cota- small external hemorrhoids otherwise normal   . Breast biopsy  05/16/95    right side  . Savory dilation  10/31/2011    Procedure: SAVORY DILATION;  Surgeon: Corbin Ade, MD;  Location: [facility];  Service: Endoscopy;  Laterality: N/A;  Elease Hashimoto dilation  10/31/2011    Procedure: Elease Hashimoto DILATION;  Surgeon: Corbin Ade, MD;  Location: [facility];  Service: Endoscopy;  Laterality: N/A;  . Abdominal  hysterectomy      Family History  Problem Relation Age of Onset  . Arthritis    . Asthma    . Diabetes    . Colon cancer Neg Hx   . Anxiety disorder Paternal Aunt   . Anxiety disorder Paternal Uncle   . Bipolar disorder Cousin     History  Substance Use Topics  . Smoking status: Never Smoker   . Smokeless tobacco: Never Used  . Alcohol Use: No  lives in NH Uses a walker   OB History   Grav Para Term Preterm Abortions TAB SAB Ect Mult Living            0       Review of Systems  HENT:       Pain to back of head  Cardiovascular: Negative for chest pain.  Gastrointestinal: Negative for abdominal pain.  Musculoskeletal: Negative for neck pain.  All other systems reviewed and are negative.     Allergies  Benzodiazepines; Ciprofloxacin; Cephalexin; and Penicillins  Home Medications   Prior to Admission medications   Medication Sig Start Date End Date Taking? Authorizing Provider  acetaminophen (TYLENOL) 500 MG tablet Take 1,000 mg by mouth every 6 (six) hours as needed for pain.  Historical Provider, MD  aspirin EC 81 MG tablet Take 81 mg by mouth every morning.     Historical Provider, MD  benztropine (COGENTIN) 0.5 MG tablet Take 1 tablet (0.5 mg total) by mouth 2 (two) times daily. 10/08/13 10/08/14  Diannia Rudereborah Ross, MD  beta carotene w/minerals (OCUVITE) tablet Take 1 tablet by mouth every morning.     Historical Provider, MD  Biotin 5000 MCG CAPS Take 1 capsule by mouth every morning.    Historical Provider, MD  buPROPion (WELLBUTRIN XL) 150 MG 24 hr tablet Take 1 tablet (150 mg total) by mouth every morning. 10/08/13 10/08/14  Diannia Rudereborah Ross, MD  calcium carbonate (OS-CAL) 600 MG TABS Take 600 mg by mouth 2 (two) times daily with a meal.     Historical Provider, MD  Cholecalciferol (VITAMIN D) 2000 UNITS CAPS Take 1 capsule by mouth every morning.     Historical Provider, MD  clonazePAM (KLONOPIN) 0.5 MG tablet Take 1 tablet (0.5 mg total) by mouth 3 (three) times  daily as needed for anxiety. 10/08/13   Diannia Rudereborah Ross, MD  docusate sodium (STOOL SOFTENER) 100 MG capsule Take 200 mg by mouth at bedtime.    Historical Provider, MD  lamoTRIgine (LAMICTAL) 100 MG tablet Take 1 tablet (100 mg total) by mouth 2 (two) times daily. 10/08/13   Diannia Rudereborah Ross, MD  losartan (COZAAR) 100 MG tablet Take 100 mg by mouth every morning.  10/04/11   Historical Provider, MD  omeprazole (PRILOSEC) 20 MG capsule Take 40 mg by mouth every morning.  09/19/10   Historical Provider, MD  PARoxetine (PAXIL) 40 MG tablet Take 60 mg by mouth at bedtime.    Historical Provider, MD  risperiDONE (RISPERDAL) 0.25 MG tablet Take 1 tablet (0.25 mg total) by mouth 3 (three) times daily. Takes at 8:00AM, 2:00PM, 5:00PM. 10/08/13   Diannia Rudereborah Ross, MD  risperiDONE (RISPERDAL) 1 MG tablet Take 1 tablet (1 mg total) by mouth at bedtime. 10/08/13   Diannia Rudereborah Ross, MD  solifenacin (VESICARE) 10 MG tablet Take 10 mg by mouth every morning.     Historical Provider, MD  terazosin (HYTRIN) 2 MG capsule Take 2 mg by mouth at bedtime.     Historical Provider, MD  vitamin B-12 (CYANOCOBALAMIN) 1000 MCG tablet Take 1,000 mcg by mouth daily.    Historical Provider, MD   BP 159/63  Pulse 83  Temp(Src) 97.9 F (36.6 C) (Oral)  Resp 18  Ht 5\' 6"  (1.676 m)  Wt 220 lb (99.791 kg)  BMI 35.53 kg/m2  SpO2 98%  Vital signs normal    Physical Exam  Nursing note and vitals reviewed. Constitutional: She is oriented to person, place, and time. She appears well-developed and well-nourished.  Non-toxic appearance. She does not appear ill. No distress.  HENT:  Head: Normocephalic.    Right Ear: External ear normal.  Left Ear: External ear normal.  Nose: Nose normal. No mucosal edema or rhinorrhea.  Mouth/Throat: Oropharynx is clear and moist and mucous membranes are normal. No dental abscesses or uvula swelling.  large swollen area to right posterior head PT has bruising under her left lower eyelids from prior fall.    Eyes: Conjunctivae and EOM are normal. Pupils are equal, round, and reactive to light.  Neck: Normal range of motion and full passive range of motion without pain. Neck supple.  Cardiovascular: Normal rate, regular rhythm and normal heart sounds.  Exam reveals no gallop and no friction rub.   No murmur heard. Pulmonary/Chest: Effort normal  and breath sounds normal. No respiratory distress. She has no wheezes. She has no rhonchi. She has no rales. She exhibits no tenderness and no crepitus.  Abdominal: Soft. Normal appearance and bowel sounds are normal. She exhibits no distension. There is no tenderness. There is no rebound and no guarding.  Musculoskeletal: Normal range of motion. She exhibits no edema and no tenderness.  Moves all extremities well.   Neurological: She is alert and oriented to person, place, and time. She has normal strength. No cranial nerve deficit.  Skin: Skin is warm, dry and intact. No rash noted. No erythema. No pallor.  Pt has bruising on her forearms  Psychiatric: She has a normal mood and affect. Her speech is normal and behavior is normal. Her mood appears not anxious.       ED Course  Procedures (including critical care time)  DIAGNOSTIC STUDIES: Oxygen Saturation is 98% on room air, normal by my interpretation.    COORDINATION OF CARE: 10:35 PM-Discussed treatment plan which includes head CT with pt at bedside and pt agreed to plan.     Labs Review Labs Reviewed - No data to display  Imaging Review Ct Head Wo Contrast  Ct Cervical Spine Wo Contrast  11/18/2013   CLINICAL DATA:  Unwitnessed fall with laceration to the back of the head and bruising below the eyes and nose.  EXAM: CT HEAD WITHOUT CONTRAST  CT CERVICAL SPINE WITHOUT CONTRAST  TECHNIQUE: Multidetector CT imaging of the head and cervical spine was performed following the standard protocol without intravenous contrast. Multiplanar CT image reconstructions of the cervical spine were also  generated.  COMPARISON:  CT head and spine 10/30/2013  FINDINGS: CT HEAD FINDINGS  Diffuse cerebral atrophy. Ventricular dilatation most likely due to central atrophy. Low-attenuation changes in the deep white matter consistent with small vessel ischemia. Large subcutaneous scalp hematoma over the left posterior parietal region. No mass effect or midline shift. No abnormal extra-axial fluid collections. Gray-white matter junctions are distinct. Basal cisterns are not effaced. No evidence of acute intracranial hemorrhage. No depressed skull fractures. Mucosal thickening in the maxillary antra consistent with inflammatory changes. Vascular calcifications.  CT CERVICAL SPINE FINDINGS  Diffuse degenerative changes throughout the cervical spine with narrowed cervical interspaces and associated endplate hypertrophic changes. Degenerative changes in the facet joints. Prominent disc osteophyte complexes at C3-4, C4-5, C5-6, and C6-7 levels. There is reversal of the usual cervical lordosis which may be due to patient positioning or degenerative changes. However, ligamentous injury or muscle spasm could also have this appearance. No prevertebral soft tissue swelling. No vertebral compression deformities. C1-2 articulation appears intact with degenerative changes present. No focal bone lesion or bone destruction. Bone cortex and trabecular architecture appear intact.  IMPRESSION: No acute intracranial abnormalities. Chronic atrophy and small vessel ischemic changes. Degenerative changes throughout the cervical spine. Nonspecific reversal of the usual cervical lordosis. No displaced fractures are identified.   Electronically Signed   By: Burman NievesWilliam  Stevens M.D.   On: 11/18/2013 00:17     EKG Interpretation None      MDM   Final diagnoses:  Fall at nursing home, initial encounter  Contusion of head, initial encounter    Plan discharge  Devoria AlbeIva Treyvin Glidden, MD, FACEP   I personally performed the services described in this  documentation, which was scribed in my presence. The recorded information has been reviewed and considered.   Ward GivensIva L Aniayah Alaniz, MD 11/18/13 [phone]

## 2013-11-17 NOTE — ED Notes (Signed)
Pt from [facility], where she reportedly fell, was found on the floor by staff.  Pt denies complaints, has a swelling to back of head

## 2013-11-18 NOTE — Discharge Instructions (Signed)
Your head CT does not show any acute injury. Use ice packs to the swollen area. You can take your acetaminophen for pain as needed. Return to the ED for any problems listed on the head injury sheet.    Head Injury You have received a head injury. It does not appear serious at this time. Headaches and vomiting are common following head injury. It should be easy to awaken from sleeping. Sometimes it is necessary for you to stay in the emergency department for a while for observation. Sometimes admission to the hospital may be needed. After injuries such as yours, most problems occur within the first 24 hours, but side effects may occur up to 7-10 days after the injury. It is important for you to carefully monitor your condition and contact your health care provider or seek immediate medical care if there is a change in your condition. WHAT ARE THE TYPES OF HEAD INJURIES? Head injuries can be as minor as a bump. Some head injuries can be more severe. More severe head injuries include:  A jarring injury to the brain (concussion).  A bruise of the brain (contusion). This mean there is bleeding in the brain that can cause swelling.  A cracked skull (skull fracture).  Bleeding in the brain that collects, clots, and forms a bump (hematoma). WHAT CAUSES A HEAD INJURY? A serious head injury is most likely to happen to someone who is in a car wreck and is not wearing a seat belt. Other causes of major head injuries include bicycle or motorcycle accidents, sports injuries, and falls. HOW ARE HEAD INJURIES DIAGNOSED? A complete history of the event leading to the injury and your current symptoms will be helpful in diagnosing head injuries. Many times, pictures of the brain, such as CT or MRI are needed to see the extent of the injury. Often, an overnight hospital stay is necessary for observation.  WHEN SHOULD I SEEK IMMEDIATE MEDICAL CARE?  You should get help right away if:  You have confusion or  drowsiness.  You feel sick to your stomach (nauseous) or have continued, forceful vomiting.  You have dizziness or unsteadiness that is getting worse.  You have severe, continued headaches not relieved by medicine. Only take over-the-counter or prescription medicines for pain, fever, or discomfort as directed by your health care provider.  You do not have normal function of the arms or legs or are unable to walk.  You notice changes in the black spots in the center of the colored part of your eye (pupil).  You have a clear or bloody fluid coming from your nose or ears.  You have a loss of vision. During the next 24 hours after the injury, you must stay with someone who can watch you for the warning signs. This person should contact local emergency services (911 in the [place] if you have seizures, you become unconscious, or you are unable to wake up. HOW CAN I PREVENT A HEAD INJURY IN THE FUTURE? The most important factor for preventing major head injuries is avoiding motor vehicle accidents. To minimize the potential for damage to your head, it is crucial to wear seat belts while riding in motor vehicles. Wearing helmets while bike riding and playing collision sports (like football) is also helpful. Also, avoiding dangerous activities around the house will further help reduce your risk of head injury.  WHEN CAN I RETURN TO NORMAL ACTIVITIES AND ATHLETICS? You should be reevaluated by your health care provider before returning to these  activities. If you have any of the following symptoms, you should not return to activities or contact sports until 1 week after the symptoms have stopped:  Persistent headache.  Dizziness or vertigo.  Poor attention and concentration.  Confusion.  Memory problems.  Nausea or vomiting.  Fatigue or tire easily.  Irritability.  Intolerant of bright lights or loud noises.  Anxiety or depression.  Disturbed sleep. MAKE SURE YOU:   Understand these  instructions.  Will watch your condition.  Will get help right away if you are not doing well or get worse. Document Released: 04/29/2005 Document Revised: 05/04/2013 Document Reviewed: 01/04/2013 [facility] Patient Information 2015 [place], [place]. This information is not intended to replace advice given to you by your health care provider. Make sure you discuss any questions you have with your health care provider.  Cryotherapy Cryotherapy means treatment with cold. Ice or gel packs can be used to reduce both pain and swelling. Ice is the most helpful within the first 24 to 48 hours after an injury or flareup from overusing a muscle or joint. Sprains, strains, spasms, burning pain, shooting pain, and aches can all be eased with ice. Ice can also be used when recovering from surgery. Ice is effective, has very few side effects, and is safe for most people to use. PRECAUTIONS  Ice is not a safe treatment option for people with:  Raynaud's phenomenon. This is a condition affecting small blood vessels in the extremities. Exposure to cold may cause your problems to return.  Cold hypersensitivity. There are many forms of cold hypersensitivity, including:  Cold urticaria. Red, itchy hives appear on the skin when the tissues begin to warm after being iced.  Cold erythema. This is a red, itchy rash caused by exposure to cold.  Cold hemoglobinuria. Red blood cells break down when the tissues begin to warm after being iced. The hemoglobin that carry oxygen are passed into the urine because they cannot combine with blood proteins fast enough.  Numbness or altered sensitivity in the area being iced. If you have any of the following conditions, do not use ice until you have discussed cryotherapy with your caregiver:  Heart conditions, such as arrhythmia, angina, or chronic heart disease.  High blood pressure.  Healing wounds or open skin in the area being iced.  Current infections.  Rheumatoid  arthritis.  Poor circulation.  Diabetes. Ice slows the blood flow in the region it is applied. This is beneficial when trying to stop inflamed tissues from spreading irritating chemicals to surrounding tissues. However, if you expose your skin to cold temperatures for too long or without the proper protection, you can damage your skin or nerves. Watch for signs of skin damage due to cold. HOME CARE INSTRUCTIONS Follow these tips to use ice and cold packs safely.  Place a dry or damp towel between the ice and skin. A damp towel will cool the skin more quickly, so you may need to shorten the time that the ice is used.  For a more rapid response, add gentle compression to the ice.  Ice for no more than 10 to 20 minutes at a time. The bonier the area you are icing, the less time it will take to get the benefits of ice.  Check your skin after 5 minutes to make sure there are no signs of a poor response to cold or skin damage.  Rest 20 minutes or more in between uses.  Once your skin is numb, you can end  your treatment. You can test numbness by very lightly touching your skin. The touch should be so light that you do not see the skin dimple from the pressure of your fingertip. When using ice, most people will feel these normal sensations in this order: cold, burning, aching, and numbness.  Do not use ice on someone who cannot communicate their responses to pain, such as small children or people with dementia. HOW TO MAKE AN ICE PACK Ice packs are the most common way to use ice therapy. Other methods include ice massage, ice baths, and cryo-sprays. Muscle creams that cause a cold, tingly feeling do not offer the same benefits that ice offers and should not be used as a substitute unless recommended by your caregiver. To make an ice pack, do one of the following:  Place crushed ice or a bag of frozen vegetables in a sealable plastic bag. Squeeze out the excess air. Place this bag inside another  plastic bag. Slide the bag into a pillowcase or place a damp towel between your skin and the bag.  Mix 3 parts water with 1 part rubbing alcohol. Freeze the mixture in a sealable plastic bag. When you remove the mixture from the freezer, it will be slushy. Squeeze out the excess air. Place this bag inside another plastic bag. Slide the bag into a pillowcase or place a damp towel between your skin and the bag. SEEK MEDICAL CARE IF:  You develop white spots on your skin. This may give the skin a blotchy (mottled) appearance.  Your skin turns blue or pale.  Your skin becomes waxy or hard.  Your swelling gets worse. MAKE SURE YOU:   Understand these instructions.  Will watch your condition.  Will get help right away if you are not doing well or get worse. Document Released: 12/24/2010 Document Revised: 07/22/2011 Document Reviewed: 12/24/2010 [facility] Patient Information 2015 [place], [place]. This information is not intended to replace advice given to you by your health care provider. Make sure you discuss any questions you have with your health care provider.

## 2013-11-18 NOTE — ED Notes (Signed)
[facility] She stated that someone was on the way to pick up the pt.

## 2013-11-18 NOTE — ED Notes (Signed)
[facility] stated [facility] would [facility] someone out to pick up the pt.

## 2013-11-29 ENCOUNTER — Emergency Department (HOSPITAL_COMMUNITY)
Admission: EM | Admit: 2013-11-29 | Discharge: 2013-11-29 | Disposition: A | Discharge status: Home / Self Care | Payer: Medicare Other | Attending: Emergency Medicine | Admitting: Emergency Medicine

## 2013-11-29 ENCOUNTER — Emergency Department (HOSPITAL_COMMUNITY): Payer: Medicare Other

## 2013-11-29 ENCOUNTER — Encounter (HOSPITAL_COMMUNITY): Payer: Self-pay | Admitting: Emergency Medicine

## 2013-11-29 DIAGNOSIS — S99929A Unspecified injury of unspecified foot, initial encounter: Principal | ICD-10-CM

## 2013-11-29 DIAGNOSIS — Z7982 Long term (current) use of aspirin: Secondary | ICD-10-CM | POA: Diagnosis not present

## 2013-11-29 DIAGNOSIS — M129 Arthropathy, unspecified: Secondary | ICD-10-CM | POA: Insufficient documentation

## 2013-11-29 DIAGNOSIS — Z880 Allergy status to penicillin: Secondary | ICD-10-CM | POA: Insufficient documentation

## 2013-11-29 DIAGNOSIS — I999 Unspecified disorder of circulatory system: Secondary | ICD-10-CM | POA: Diagnosis not present

## 2013-11-29 DIAGNOSIS — Y9389 Activity, other specified: Secondary | ICD-10-CM | POA: Insufficient documentation

## 2013-11-29 DIAGNOSIS — S8990XA Unspecified injury of unspecified lower leg, initial encounter: Secondary | ICD-10-CM | POA: Diagnosis not present

## 2013-11-29 DIAGNOSIS — R296 Repeated falls: Secondary | ICD-10-CM | POA: Insufficient documentation

## 2013-11-29 DIAGNOSIS — W19XXXA Unspecified fall, initial encounter: Secondary | ICD-10-CM

## 2013-11-29 DIAGNOSIS — Y9210 Unspecified residential institution as the place of occurrence of the external cause: Secondary | ICD-10-CM | POA: Diagnosis not present

## 2013-11-29 DIAGNOSIS — I10 Essential (primary) hypertension: Secondary | ICD-10-CM | POA: Insufficient documentation

## 2013-11-29 DIAGNOSIS — Y92129 Unspecified place in nursing home as the place of occurrence of the external cause: Secondary | ICD-10-CM

## 2013-11-29 DIAGNOSIS — Z79899 Other long term (current) drug therapy: Secondary | ICD-10-CM | POA: Insufficient documentation

## 2013-11-29 DIAGNOSIS — S99919A Unspecified injury of unspecified ankle, initial encounter: Principal | ICD-10-CM

## 2013-11-29 DIAGNOSIS — F319 Bipolar disorder, unspecified: Secondary | ICD-10-CM | POA: Insufficient documentation

## 2013-11-29 DIAGNOSIS — T1490XA Injury, unspecified, initial encounter: Secondary | ICD-10-CM | POA: Diagnosis not present

## 2013-11-29 DIAGNOSIS — R609 Edema, unspecified: Secondary | ICD-10-CM | POA: Diagnosis not present

## 2013-11-29 DIAGNOSIS — F411 Generalized anxiety disorder: Secondary | ICD-10-CM | POA: Insufficient documentation

## 2013-11-29 LAB — BASIC METABOLIC PANEL
Anion gap: 8 (ref 5–15)
BUN: 32 mg/dL — ABNORMAL HIGH (ref 6–23)
CHLORIDE: 104 meq/L (ref 96–112)
CO2: 28 mEq/L (ref 19–32)
Calcium: 9.9 mg/dL (ref 8.4–10.5)
Creatinine, Ser: 1.34 mg/dL — ABNORMAL HIGH (ref 0.50–1.10)
GFR calc Af Amer: 44 mL/min — ABNORMAL LOW (ref >90)
GFR calc non Af Amer: 38 mL/min — ABNORMAL LOW (ref >90)
GLUCOSE: 94 mg/dL (ref 70–99)
POTASSIUM: 4.7 meq/L (ref 3.7–5.3)
SODIUM: 140 meq/L (ref 137–147)

## 2013-11-29 LAB — PROTIME-INR
INR: 1 (ref 0.00–1.49)
Prothrombin Time: 13.2 seconds (ref 11.6–15.2)

## 2013-11-29 NOTE — ED Notes (Signed)
[facility] called for transport back to facility.

## 2013-11-29 NOTE — Discharge Instructions (Signed)
Fall Prevention and Home Safety °Falls cause injuries and can affect all age groups. It is possible to prevent falls.  °HOW TO PREVENT FALLS °· Wear shoes with rubber soles that do not have an opening for your toes. °· Keep the inside and outside of your house well lit. °· Use night lights throughout your home. °· Remove clutter from floors. °· Clean up floor spills. °· Remove throw rugs or fasten them to the floor with carpet tape. °· Do not place electrical cords across pathways. °· Put grab bars by your tub, shower, and toilet. Do not use towel bars as grab bars. °· Put handrails on both sides of the stairway. Fix loose handrails. °· Do not climb on stools or stepladders, if possible. °· Do not wax your floors. °· Repair uneven or unsafe sidewalks, walkways, or stairs. °· Keep items you use a lot within reach. °· Be aware of pets. °· Keep emergency numbers next to the telephone. °· Put smoke detectors in your home and near bedrooms. °Ask your doctor what other things you can do to prevent falls. °Document Released: 02/23/2009 Document Revised: 10/29/2011 Document Reviewed: 07/30/2011 °ExitCare® Patient Information ©2015 [employer]. This information is not intended to replace advice given to you by your health care provider. Make sure you discuss any questions you have with your health care provider. ° °

## 2013-11-29 NOTE — ED Notes (Signed)
[facility] after pt lost her balance, while washing her feet, fell backwards hitting middle of back. No LOC, denies hitting head. Brought in for evaluation.

## 2013-11-29 NOTE — ED Provider Notes (Signed)
Patient seen and evaluated independently. I reviewed the patient's evaluation with Tammy triplett,  PA.  Patient has a history of intermittent and asymmetric swelling of her extremities. No DVT. Plan is discharged home. Primary care followup. No findings of injury on exam.  Rolland PorterMark Illana Nolting, MD 11/29/13 1435

## 2013-11-29 NOTE — ED Notes (Signed)
Patient to US

## 2013-11-29 NOTE — ED Notes (Signed)
Patient ambulatory to restroom. Uses walker without difficulty.

## 2013-11-30 ENCOUNTER — Other Ambulatory Visit (HOSPITAL_COMMUNITY): Payer: Self-pay | Admitting: Family Medicine

## 2013-11-30 DIAGNOSIS — E568 Deficiency of other vitamins: Secondary | ICD-10-CM

## 2013-11-30 DIAGNOSIS — M169 Osteoarthritis of hip, unspecified: Secondary | ICD-10-CM

## 2013-11-30 DIAGNOSIS — M159 Polyosteoarthritis, unspecified: Secondary | ICD-10-CM

## 2013-11-30 DIAGNOSIS — M1610 Unilateral primary osteoarthritis, unspecified hip: Secondary | ICD-10-CM

## 2013-11-30 DIAGNOSIS — Z681 Body mass index (BMI) 19 or less, adult: Secondary | ICD-10-CM | POA: Diagnosis not present

## 2013-11-30 DIAGNOSIS — Z5189 Encounter for other specified aftercare: Secondary | ICD-10-CM | POA: Diagnosis not present

## 2013-11-30 DIAGNOSIS — W19XXXA Unspecified fall, initial encounter: Secondary | ICD-10-CM | POA: Diagnosis not present

## 2013-11-30 DIAGNOSIS — M150 Primary generalized (osteo)arthritis: Principal | ICD-10-CM

## 2013-11-30 DIAGNOSIS — M5030 Other cervical disc degeneration, unspecified cervical region: Secondary | ICD-10-CM

## 2013-12-01 ENCOUNTER — Other Ambulatory Visit (HOSPITAL_COMMUNITY): Payer: Self-pay | Admitting: Family Medicine

## 2013-12-01 DIAGNOSIS — M79609 Pain in unspecified limb: Secondary | ICD-10-CM | POA: Diagnosis not present

## 2013-12-01 DIAGNOSIS — B351 Tinea unguium: Secondary | ICD-10-CM | POA: Diagnosis not present

## 2013-12-01 DIAGNOSIS — Z1231 Encounter for screening mammogram for malignant neoplasm of breast: Secondary | ICD-10-CM

## 2013-12-01 DIAGNOSIS — Z139 Encounter for screening, unspecified: Secondary | ICD-10-CM

## 2013-12-01 DIAGNOSIS — M810 Age-related osteoporosis without current pathological fracture: Secondary | ICD-10-CM

## 2013-12-06 DIAGNOSIS — F489 Nonpsychotic mental disorder, unspecified: Secondary | ICD-10-CM | POA: Diagnosis not present

## 2013-12-06 DIAGNOSIS — I129 Hypertensive chronic kidney disease with stage 1 through stage 4 chronic kidney disease, or unspecified chronic kidney disease: Secondary | ICD-10-CM | POA: Diagnosis not present

## 2013-12-06 DIAGNOSIS — N183 Chronic kidney disease, stage 3 unspecified: Secondary | ICD-10-CM | POA: Diagnosis not present

## 2013-12-06 DIAGNOSIS — N329 Bladder disorder, unspecified: Secondary | ICD-10-CM | POA: Diagnosis not present

## 2013-12-07 ENCOUNTER — Ambulatory Visit (HOSPITAL_COMMUNITY)
Admission: RE | Admit: 2013-12-07 | Discharge: 2013-12-07 | Disposition: A | Discharge status: Home / Self Care | Payer: Medicare Other | Source: Ambulatory Visit | Attending: Family Medicine | Admitting: Family Medicine

## 2013-12-07 DIAGNOSIS — M810 Age-related osteoporosis without current pathological fracture: Secondary | ICD-10-CM | POA: Diagnosis not present

## 2013-12-07 DIAGNOSIS — Z780 Asymptomatic menopausal state: Secondary | ICD-10-CM | POA: Diagnosis not present

## 2013-12-09 ENCOUNTER — Ambulatory Visit (HOSPITAL_COMMUNITY)
Admission: RE | Admit: 2013-12-09 | Discharge: 2013-12-09 | Disposition: A | Discharge status: Home / Self Care | Payer: Medicare Other | Source: Ambulatory Visit | Attending: Family Medicine | Admitting: Family Medicine

## 2013-12-09 DIAGNOSIS — Z1231 Encounter for screening mammogram for malignant neoplasm of breast: Secondary | ICD-10-CM | POA: Diagnosis not present

## 2013-12-12 NOTE — ED Provider Notes (Signed)
CSN: [id number]     Arrival date & time 11/29/13  1012 History   First MD Initiated Contact with Patient 11/29/13 1028     Chief Complaint  Patient presents with  . Fall   Amy Clay is a 73 y.o. female who presents to the Emergency Department from assisted living facility, with report from the facility with hx of fall.  Pt states she fell back while trying to wash her feet and lost her balance.  Pt denies any pain, head injury, nausea, vomiting, headache, dizziness or LOC.  She does report frequent falls and states the she normally uses a walker but did not have it at the time of this incident.    (Consider location/radiation/quality/duration/timing/severity/associated sxs/prior Treatment) HPI  Past Medical History  Diagnosis Date  . High blood pressure   . Arthritis   . Depression   . Anxiety   . Hemorrhoids   . Hyperlipidemia   . Bipolar 1 disorder    Past Surgical History  Procedure Laterality Date  . Back surgery  03/16/87  . Cholecystectomy  08/15/93  . Partial hysterectomy  04/04/95  . Bladder tacked  04/04/95  . Ankle surgery  04/28/97    right ankle fracture  . Wrist surgery  02/24/09    right wrist fracture  . Colonoscopy  10/26/2002    Dr. Karilyn Cota- small external hemorrhoids otherwise normal   . Breast biopsy  05/16/95    right side  . Savory dilation  10/31/2011    Procedure: SAVORY DILATION;  Surgeon: Corbin Ade, MD;  Location: [facility];  Service: Endoscopy;  Laterality: N/A;  Elease Hashimoto dilation  10/31/2011    Procedure: Elease Hashimoto DILATION;  Surgeon: Corbin Ade, MD;  Location: [facility];  Service: Endoscopy;  Laterality: N/A;  . Abdominal hysterectomy     Family History  Problem Relation Age of Onset  . Arthritis    . Asthma    . Diabetes    . Colon cancer Neg Hx   . Anxiety disorder Paternal Aunt   . Anxiety disorder Paternal Uncle   . Bipolar disorder Cousin    History  Substance Use Topics  . Smoking status: Never Smoker   . Smokeless  tobacco: Never Used  . Alcohol Use: No   OB History   Grav Para Term Preterm Abortions TAB SAB Ect Mult Living            0     Review of Systems  Constitutional: Negative for fever, chills and fatigue.  HENT: Negative for sore throat and trouble swallowing.   Respiratory: Negative for cough, shortness of breath and wheezing.   Cardiovascular: Negative for chest pain and palpitations.  Gastrointestinal: Negative for nausea, vomiting, abdominal pain and blood in stool.  Genitourinary: Negative for dysuria, hematuria and flank pain.  Musculoskeletal: Negative for arthralgias, back pain, joint swelling, myalgias, neck pain and neck stiffness.  Skin: Negative for rash.  Neurological: Negative for dizziness, syncope, speech difficulty, weakness, numbness and headaches.  Hematological: Does not bruise/bleed easily.  All other systems reviewed and are negative.     Allergies  Benzodiazepines; Ciprofloxacin; Cephalexin; and Penicillins  Home Medications   Prior to Admission medications   Medication Sig Start Date End Date Taking? Authorizing Provider  acetaminophen (TYLENOL) 500 MG tablet Take 1,000 mg by mouth every 6 (six) hours as needed for pain.   Yes Historical Provider, MD  aspirin EC 81 MG tablet Take 81 mg by mouth every morning.  Yes Historical Provider, MD  benztropine (COGENTIN) 0.5 MG tablet Take 1 tablet (0.5 mg total) by mouth 2 (two) times daily. 10/08/13 10/08/14 Yes Diannia Ruder, MD  beta carotene w/minerals (OCUVITE) tablet Take 1 tablet by mouth every morning.    Yes Historical Provider, MD  Biotin 5000 MCG CAPS Take 1 capsule by mouth every morning.   Yes Historical Provider, MD  buPROPion (WELLBUTRIN XL) 150 MG 24 hr tablet Take 1 tablet (150 mg total) by mouth every morning. 10/08/13 10/08/14 Yes Diannia Ruder, MD  calcium carbonate (OS-CAL) 600 MG TABS Take 1,200 mg by mouth daily with breakfast.    Yes Historical Provider, MD  Cholecalciferol (VITAMIN D) 2000 UNITS  CAPS Take 1 capsule by mouth every morning.    Yes Historical Provider, MD  docusate sodium (STOOL SOFTENER) 100 MG capsule Take 200 mg by mouth at bedtime.   Yes Historical Provider, MD  lamoTRIgine (LAMICTAL) 100 MG tablet Take 1 tablet (100 mg total) by mouth 2 (two) times daily. 10/08/13  Yes Diannia Ruder, MD  losartan (COZAAR) 100 MG tablet Take 100 mg by mouth every morning.  10/04/11  Yes Historical Provider, MD  multivitamin (PROSIGHT) TABS tablet Take 1 tablet by mouth daily.   Yes Historical Provider, MD  omeprazole (PRILOSEC) 40 MG capsule Take 40 mg by mouth daily.   Yes Historical Provider, MD  PARoxetine (PAXIL) 40 MG tablet Take 60 mg by mouth at bedtime.   Yes Historical Provider, MD  risperiDONE (RISPERDAL) 0.25 MG tablet Take 1 tablet (0.25 mg total) by mouth 3 (three) times daily. Takes at 8:00AM, 2:00PM, 5:00PM. 10/08/13  Yes Diannia Ruder, MD  risperiDONE (RISPERDAL) 1 MG tablet Take 1 tablet (1 mg total) by mouth at bedtime. 10/08/13  Yes Diannia Ruder, MD  solifenacin (VESICARE) 10 MG tablet Take 10 mg by mouth every morning.    Yes Historical Provider, MD  terazosin (HYTRIN) 2 MG capsule Take 2 mg by mouth at bedtime.    Yes Historical Provider, MD  vitamin B-12 (CYANOCOBALAMIN) 1000 MCG tablet Take 1,000 mcg by mouth daily.   Yes Historical Provider, MD  vitamin C (ASCORBIC ACID) 500 MG tablet Take 1,000 mg by mouth daily.    Yes Historical Provider, MD  clonazePAM (KLONOPIN) 0.5 MG tablet Take 1 tablet (0.5 mg total) by mouth 3 (three) times daily as needed for anxiety. 10/08/13   Diannia Ruder, MD   BP 153/80  Pulse 74  Temp(Src) 97.6 F (36.4 C) (Oral)  Resp 20  Ht 5\' 6"  (1.676 m)  Wt 220 lb (99.791 kg)  BMI 35.53 kg/m2  SpO2 100% Physical Exam  Nursing note and vitals reviewed. Constitutional: She is oriented to person, place, and time. She appears well-developed and well-nourished. No distress.  HENT:  Head: Normocephalic and atraumatic.  Mouth/Throat: Oropharynx is  clear and moist.  Eyes: Conjunctivae and EOM are normal. Pupils are equal, round, and reactive to light.  Neck: Normal range of motion, full passive range of motion without pain and phonation normal. Neck supple. No spinous process tenderness and no muscular tenderness present.  Cardiovascular: Normal rate, regular rhythm, normal heart sounds and intact distal pulses.   No murmur heard. Pulmonary/Chest: Effort normal and breath sounds normal. No respiratory distress. She exhibits no tenderness.  Abdominal: Soft. She exhibits no distension. There is no tenderness. There is no rebound and no guarding.  Musculoskeletal: Normal range of motion.  Pt has mild to moderate LLE edema and erythema.  Negative Homan's sign.  DP pulse intact.  No spinal tenderness on exam.  Lymphadenopathy:    She has no cervical adenopathy.  Neurological: She is alert and oriented to person, place, and time. She exhibits normal muscle tone. Coordination normal.  Skin: Skin is warm and dry.    ED Course  Procedures (including critical care time) Labs Review Labs Reviewed  BASIC METABOLIC PANEL - Abnormal; Notable for the following:    BUN 32 (*)    Creatinine, Ser 1.34 (*)    GFR calc non Af Amer 38 (*)    GFR calc Af Amer 44 (*)    All other components within normal limits  PROTIME-INR    Imaging Review   [place] Venous Img Lower Unilateral Left  11/29/2013   CLINICAL DATA:  Fall  EXAM: LEFT LOWER EXTREMITY VENOUS DUPLEX ULTRASOUND  TECHNIQUE: Doppler venous assessment of the left lower extremity deep venous system was performed, including characterization of spectral flow, compressibility, and phasicity.  COMPARISON:  None.  FINDINGS: There is complete compressibility of the left common femoral, femoral, and popliteal veins. Doppler analysis demonstrates respiratory phasicity and augmentation of flow upon calf compression. No obvious calf vein or superficial vein thrombosis. Subcutaneous edema in the calf is noted.   IMPRESSION: No evidence of left lower extremity DVT.   Electronically Signed   By: Maryclare BeanArt  Hoss M.D.   On: 11/29/2013 13:24      EKG Interpretation None      MDM   Final diagnoses:  Fall at nursing home, initial encounter    Pt is well appearing, moves all extremities well.  LLE erythema and edema.  Negative for DVT by US. No open wounds to indicate cellulitis. Pt has reported h/o intermittent unilateral LE edema.   Pt ambulates in the dept with her walker w/o difficulty.    Pt hx and care plan discussed with Dr. Fayrene FearingJames.  Pt appears stable for d/c back to facility and agrees to close f/u with PMD.   Iasiah Ozment L. Trisha Mangleriplett, PA-C 12/12/13 2140

## 2013-12-13 ENCOUNTER — Other Ambulatory Visit: Payer: Self-pay | Admitting: Family Medicine

## 2013-12-13 DIAGNOSIS — R928 Other abnormal and inconclusive findings on diagnostic imaging of breast: Secondary | ICD-10-CM

## 2013-12-16 NOTE — ED Provider Notes (Signed)
Medical screening examination/treatment/procedure(s) were performed by non-physician practitioner and as supervising physician I was immediately available for consultation/collaboration.   EKG Interpretation None        Rolland PorterMark Ceirra Belli, MD 12/16/13 [phone]

## 2014-01-07 ENCOUNTER — Ambulatory Visit (INDEPENDENT_AMBULATORY_CARE_PROVIDER_SITE_OTHER): Payer: Medicare Other | Admitting: Psychiatry

## 2014-01-07 ENCOUNTER — Encounter (HOSPITAL_COMMUNITY): Payer: Self-pay | Admitting: Psychiatry

## 2014-01-07 VITALS — BP 123/69 | HR 54 | Ht 66.0 in | Wt 228.0 lb

## 2014-01-07 DIAGNOSIS — F5105 Insomnia due to other mental disorder: Secondary | ICD-10-CM | POA: Diagnosis not present

## 2014-01-07 DIAGNOSIS — F331 Major depressive disorder, recurrent, moderate: Secondary | ICD-10-CM | POA: Diagnosis not present

## 2014-01-07 DIAGNOSIS — F489 Nonpsychotic mental disorder, unspecified: Secondary | ICD-10-CM

## 2014-01-07 MED ORDER — BENZTROPINE MESYLATE 0.5 MG PO TABS: 0.5000 mg | ORAL_TABLET | Freq: Two times a day (BID) | ORAL | Status: DC

## 2014-01-07 MED ORDER — RISPERIDONE 0.25 MG PO TABS: 0.2500 mg | ORAL_TABLET | Freq: Three times a day (TID) | ORAL | Status: DC

## 2014-01-07 MED ORDER — LAMOTRIGINE 100 MG PO TABS: 100.0000 mg | ORAL_TABLET | Freq: Two times a day (BID) | ORAL | Status: DC

## 2014-01-07 MED ORDER — BUPROPION HCL ER (XL) 150 MG PO TB24: 150.0000 mg | ORAL_TABLET | ORAL | Status: DC

## 2014-01-07 MED ORDER — PAROXETINE HCL 40 MG PO TABS: 60.0000 mg | ORAL_TABLET | Freq: Every day | ORAL | Status: DC

## 2014-01-07 MED ORDER — CLONAZEPAM 0.5 MG PO TABS: 0.5000 mg | ORAL_TABLET | Freq: Two times a day (BID) | ORAL | Status: DC | PRN

## 2014-01-07 MED ORDER — RISPERIDONE 1 MG PO TABS: 1.0000 mg | ORAL_TABLET | Freq: Every day | ORAL | Status: DC

## 2014-01-07 NOTE — Progress Notes (Signed)
Patient ID: Amy Clay, female   DOB: December 03, 1940, 73 y.o.   MRN: [id number] Patient ID: Amy Clay, female   DOB: 10-May-1941, 73 y.o.   MRN: [id number] Patient ID: Amy Clay, female   DOB: 07-22-1940, 73 y.o.   MRN: [id number] Patient ID: Amy Clay, female   DOB: July 27, 1940, 73 y.o.   MRN: [id number] Patient ID: Amy Clay, female   DOB: 01-26-1941, 73 y.o.   MRN: [id number] Patient ID: Amy Clay, female   DOB: 21-May-1940, 73 y.o.   MRN: [id number] Patient ID: Amy Clay, female   DOB: [birth year], 73 y.o.   MRN: [id number] [facility] [id number] Progress Note Amy Clay MRN: [id number] DOB: 13-Jul-1940 Age: 73 y.o.  Date: 01/07/2014  Chief Complaint  Patient presents with  . Anxiety  . Depression  . Altered Mental Status  . Follow-up   History of presenting illness Patient is 73 year old Caucasian female who came with her sister for her followup appointment. [facility] in [facility] [place] [facility]. She and her sister had been living together in the family farm outside of [place]. Neither one has ever been married or have had children. The patient worked until the early 80s when she began to have difficulties with her nerves.  Currently the patient is still struggling with her mental and physical health. She was admitted last year to a psychiatric hospital in [place] because she was anxious nervous and depressed. Last time Dr. Lolly Mustache increased her Risperdal which is helped somewhat. She can't walk and it's not clear why much of it seems to be anxiety based and she seems to have a fear of falling. She's also recently developed possible renal failure and is can be referred to a nephrologist. She was on lithium for a number of years. We do not have her  recent records from her primary care physician who is outside of [place]  The patient returns after 2 months. She is here with her sister and her aide Amy Clay. Her mood is  pretty good and she is smiling. She has gained some weight and is not very mobile at the nursing home. I encouraged her to try to walk more. Unfortunate she fell twice in July and was seen at [facility] emergency room. Her brain CT did not show any acute changes. Both her sister and a report that the patient is very drowsy through the day. She always takes her when necessary clonazepam and I think we need to cut this down. She sleeps well at night and denies any auditory or visual hallucinations. She's more social and is not crying like she used to  Past psychiatric history Patient is seeing psychiatrist since 1997 in this office.  Her last psychiatric admission was at [facility] after having suicidal thoughts.  She denies any history of suicidal attempt however endorse history of hopeless feeling.  She is diagnosed with major depressive disorder. She she was very stable on lithium until her creatinine went up and lithium was discontinued.  We had recently tried Lamictal and the dose has been increased recently.  In the past we had tried BuSpar which was discontinued at hospital.  She has another psychiatric inpatient treatment in 90s. She admitted history of mania, depression and psychosis.  Social history Patient lives in a nursing home.  She has no children.  Her husband died in nursing home.  Patient has no other family.  Medical history Patient was recently admitted  due to dehydration and UTI.  Patient has history of arthritis blood pressure and chronic pain. Her primary care physician is Dr. Phillips Odor at [facility].  She takes medication for her blood pressure and arthritis.  She had history of jerking movements, fall and generalized pain. Her last blood work was done on 02/14/2012 which shows anemia, her creatinine was 1.33.  She was given antibiotic result UTI.  Her WBC count was 10.4.  Family History family history includes Anxiety disorder in her paternal aunt  and paternal uncle; Arthritis in an other family member; Asthma in an other family member; Bipolar disorder in her cousin; Diabetes in an other family member. There is no history of Colon cancer.  Review of Systems  Constitutional: Positive for malaise/fatigue.  Skin: Negative.   Neurological: Positive for tremors, weakness and headaches.  Psychiatric/Behavioral: Positive for memory loss. Negative for suicidal ideas, hallucinations and substance abuse. The patient is nervous/anxious. The patient does not have insomnia.      Mental status examination Patient is casually dressed and fairly groomed. She is in a wheelchair   She described her mood as better.  Her voice is high-pitched and soft.  .Her affect is bright.  Her attention and concentration are improved.  She expresses herself well .  She doesn't have auditory or visual hallucination.  There were no paranoia or delusion obsession present at this time.  Her fund of knowledge is okay.  There are no tremors and shakes in her hand.  She's alert and oriented x3.  Her insight judgment and impulse control is okay.  Lab Results:  Results for orders placed during the hospital encounter of 11/29/13 (from the past 8736 hour(s))  BASIC METABOLIC PANEL   Collection Time    11/29/13 12:36 PM      Result Value Ref Range   Sodium 140  137 - 147 mEq/L   Potassium 4.7  3.7 - 5.3 mEq/L   Chloride 104  96 - 112 mEq/L   CO2 28  19 - 32 mEq/L   Glucose, Bld 94  70 - 99 mg/dL   BUN 32 (*) 6 - 23 mg/dL   Creatinine, Ser 1.61 (*) 0.50 - 1.10 mg/dL   Calcium 9.9  8.4 - 09.6 mg/dL   GFR calc non Af Amer 38 (*) >90 mL/min   GFR calc Af Amer 44 (*) >90 mL/min   Anion gap 8  5 - 15  PROTIME-INR   Collection Time    11/29/13 12:36 PM      Result Value Ref Range   Prothrombin Time 13.2  11.6 - 15.2 seconds   INR 1.00  0.00 - 1.49  Results for orders placed during the hospital encounter of 07/18/13 (from the past 8736 hour(s))  CBC   Collection Time     07/18/13  8:49 AM      Result Value Ref Range   WBC 7.0  4.0 - 10.5 K/uL   RBC 3.89  3.87 - 5.11 MIL/uL   Hemoglobin 11.2 (*) 12.0 - 15.0 g/dL   HCT 04.5  40.9 - 81.1 %   MCV 94.1  78.0 - 100.0 fL   MCH 28.8  26.0 - 34.0 pg   MCHC 30.6  30.0 - 36.0 g/dL   RDW 91.4  78.2 - 95.6 %   Platelets 230  150 - 400 K/uL  COMPREHENSIVE METABOLIC PANEL   Collection Time    07/18/13  8:49 AM      Result Value Ref  Range   Sodium 141  137 - 147 mEq/L   Potassium 4.5  3.7 - 5.3 mEq/L   Chloride 104  96 - 112 mEq/L   CO2 27  19 - 32 mEq/L   Glucose, Bld 126 (*) 70 - 99 mg/dL   BUN 37 (*) 6 - 23 mg/dL   Creatinine, Ser 2.59 (*) 0.50 - 1.10 mg/dL   Calcium 56.3  8.4 - 87.5 mg/dL   Total Protein 7.2  6.0 - 8.3 g/dL   Albumin 3.3 (*) 3.5 - 5.2 g/dL   AST 71 (*) 0 - 37 U/L   ALT 71 (*) 0 - 35 U/L   Alkaline Phosphatase 127 (*) 39 - 117 U/L   Total Bilirubin 0.2 (*) 0.3 - 1.2 mg/dL   GFR calc non Af Amer 30 (*) >90 mL/min   GFR calc Af Amer 35 (*) >90 mL/min  TROPONIN I   Collection Time    07/18/13  8:49 AM      Result Value Ref Range   Troponin I <0.30  <0.30 ng/mL  CBG MONITORING, ED   Collection Time    07/18/13  8:58 AM      Result Value Ref Range   Glucose-Capillary 118 (*) 70 - 99 mg/dL  URINE CULTURE   Collection Time    07/18/13  9:12 AM      Result Value Ref Range   Specimen Description URINE, CATHETERIZED     Special Requests NONE     Culture  Setup Time       Value: 07/18/2013 23:14     Performed at [facility] Count       Value: >=100,000 COLONIES/ML     Performed at [facility]   Culture       Value: VIRIDANS STREPTOCOCCUS     Performed at [facility]   Report Status 07/19/2013 FINAL    URINALYSIS, ROUTINE W REFLEX MICROSCOPIC   Collection Time    07/18/13  9:12 AM      Result Value Ref Range   Color, Urine YELLOW  YELLOW   APPearance CLOUDY (*) CLEAR   Specific Gravity, Urine 1.010  1.005 - 1.030   pH 6.5  5.0 - 8.0    Glucose, UA NEGATIVE  NEGATIVE mg/dL   Hgb urine dipstick NEGATIVE  NEGATIVE   Bilirubin Urine NEGATIVE  NEGATIVE   Ketones, ur NEGATIVE  NEGATIVE mg/dL   Protein, ur NEGATIVE  NEGATIVE mg/dL   Urobilinogen, UA 0.2  0.0 - 1.0 mg/dL   Nitrite NEGATIVE  NEGATIVE   Leukocytes, UA NEGATIVE  NEGATIVE  Results for orders placed during the hospital encounter of 02/14/13 (from the past 8736 hour(s))  CBC   Collection Time    02/14/13 10:07 AM      Result Value Ref Range   WBC 10.1  4.0 - 10.5 K/uL   RBC 3.73 (*) 3.87 - 5.11 MIL/uL   Hemoglobin 11.0 (*) 12.0 - 15.0 g/dL   HCT 64.3 (*) 32.9 - 51.8 %   MCV 93.8  78.0 - 100.0 fL   MCH 29.5  26.0 - 34.0 pg   MCHC 31.4  30.0 - 36.0 g/dL   RDW 84.1  66.0 - 63.0 %   Platelets 204  150 - 400 K/uL  BASIC METABOLIC PANEL   Collection Time    02/14/13 10:07 AM      Result Value Ref Range   Sodium 140  135 - 145 mEq/L  Potassium 4.2  3.5 - 5.1 mEq/L   Chloride 102  96 - 112 mEq/L   CO2 27  19 - 32 mEq/L   Glucose, Bld 146 (*) 70 - 99 mg/dL   BUN 22  6 - 23 mg/dL   Creatinine, Ser 1.61 (*) 0.50 - 1.10 mg/dL   Calcium 09.6  8.4 - 04.5 mg/dL   GFR calc non Af Amer 31 (*) >90 mL/min   GFR calc Af Amer 36 (*) >90 mL/min  URINALYSIS, ROUTINE W REFLEX MICROSCOPIC   Collection Time    02/14/13 10:20 AM      Result Value Ref Range   Color, Urine YELLOW  YELLOW   APPearance CLEAR  CLEAR   Specific Gravity, Urine 1.015  1.005 - 1.030   pH 6.5  5.0 - 8.0   Glucose, UA NEGATIVE  NEGATIVE mg/dL   Hgb urine dipstick MODERATE (*) NEGATIVE   Bilirubin Urine NEGATIVE  NEGATIVE   Ketones, ur NEGATIVE  NEGATIVE mg/dL   Protein, ur 30 (*) NEGATIVE mg/dL   Urobilinogen, UA 0.2  0.0 - 1.0 mg/dL   Nitrite NEGATIVE  NEGATIVE   Leukocytes, UA LARGE (*) NEGATIVE  URINE MICROSCOPIC-ADD ON   Collection Time    02/14/13 10:20 AM      Result Value Ref Range   WBC, UA TOO NUMEROUS TO COUNT  <3 WBC/hpf   RBC / HPF TOO NUMEROUS TO COUNT  <3 RBC/hpf   Bacteria,  UA MANY (*) RARE  URINE CULTURE   Collection Time    02/14/13 10:30 AM      Result Value Ref Range   Specimen Description URINE, CATHETERIZED     Special Requests NONE     Culture  Setup Time       Value: 02/14/2013 21:47     Performed at [facility]       Value: >=100,000 COLONIES/ML     Performed at [facility]   Culture       Value: GROUP B STREP(S.AGALACTIAE)ISOLATED     Note: TESTING AGAINST S. AGALACTIAE NOT ROUTINELY PERFORMED DUE TO PREDICTABILITY OF AMP/PEN/VAN SUSCEPTIBILITY.     Performed at [facility]   Report Status 02/16/2013 FINAL    Results for orders placed during the hospital encounter of 02/09/13 (from the past 8736 hour(s))  CBC WITH DIFFERENTIAL   Collection Time    02/09/13  7:41 PM      Result Value Ref Range   WBC 5.7  4.0 - 10.5 K/uL   RBC 3.87  3.87 - 5.11 MIL/uL   Hemoglobin 11.2 (*) 12.0 - 15.0 g/dL   HCT 40.9  81.1 - 91.4 %   MCV 93.8  78.0 - 100.0 fL   MCH 28.9  26.0 - 34.0 pg   MCHC 30.9  30.0 - 36.0 g/dL   RDW 78.2  95.6 - 21.3 %   Platelets 211  150 - 400 K/uL   Neutrophils Relative % 72  43 - 77 %   Neutro Abs 4.1  1.7 - 7.7 K/uL   Lymphocytes Relative 18  12 - 46 %   Lymphs Abs 1.0  0.7 - 4.0 K/uL   Monocytes Relative 9  3 - 12 %   Monocytes Absolute 0.5  0.1 - 1.0 K/uL   Eosinophils Relative 1  0 - 5 %   Eosinophils Absolute 0.1  0.0 - 0.7 K/uL   Basophils Relative 0  0 - 1 %  Basophils Absolute 0.0  0.0 - 0.1 K/uL  BASIC METABOLIC PANEL   Collection Time    02/09/13  7:41 PM      Result Value Ref Range   Sodium 137  135 - 145 mEq/L   Potassium 4.8  3.5 - 5.1 mEq/L   Chloride 101  96 - 112 mEq/L   CO2 28  19 - 32 mEq/L   Glucose, Bld 95  70 - 99 mg/dL   BUN 28 (*) 6 - 23 mg/dL   Creatinine, Ser 0.96 (*) 0.50 - 1.10 mg/dL   Calcium 04.5  8.4 - 40.9 mg/dL   GFR calc non Af Amer 29 (*) >90 mL/min   GFR calc Af Amer 34 (*) >90 mL/min  URINE CULTURE   Collection Time    02/09/13  9:11  PM      Result Value Ref Range   Specimen Description URINE, CLEAN CATCH     Special Requests NONE     Culture  Setup Time       Value: 02/09/2013 21:25     Performed at [facility]       Value: >=100,000 COLONIES/ML     Performed at [facility]   Culture       Value: Multiple bacterial morphotypes present, none predominant. Suggest appropriate recollection if clinically indicated.     Performed at [facility]   Report Status 02/11/2013 FINAL    URINALYSIS, ROUTINE W REFLEX MICROSCOPIC   Collection Time    02/09/13  9:11 PM      Result Value Ref Range   Color, Urine YELLOW  YELLOW   APPearance CLEAR  CLEAR   Specific Gravity, Urine 1.010  1.005 - 1.030   pH 6.0  5.0 - 8.0   Glucose, UA NEGATIVE  NEGATIVE mg/dL   Hgb urine dipstick NEGATIVE  NEGATIVE   Bilirubin Urine NEGATIVE  NEGATIVE   Ketones, ur NEGATIVE  NEGATIVE mg/dL   Protein, ur NEGATIVE  NEGATIVE mg/dL   Urobilinogen, UA 0.2  0.0 - 1.0 mg/dL   Nitrite NEGATIVE  NEGATIVE   Leukocytes, UA SMALL (*) NEGATIVE  URINE MICROSCOPIC-ADD ON   Collection Time    02/09/13  9:11 PM      Result Value Ref Range   Squamous Epithelial / LPF MANY (*) RARE   WBC, UA 7-10  <3 WBC/hpf   RBC / HPF 0-2  <3 RBC/hpf   Bacteria, UA MANY (*) RARE   Casts HYALINE CASTS (*) NEGATIVE  TSH   Collection Time    02/09/13 11:06 PM      Result Value Ref Range   TSH 0.567  0.350 - 4.500 uIU/mL  URIC ACID   Collection Time    02/09/13 11:06 PM      Result Value Ref Range   Uric Acid, Serum 5.7  2.4 - 7.0 mg/dL  URINE CULTURE   Collection Time    02/10/13  1:04 AM      Result Value Ref Range   Specimen Description URINE, CATHETERIZED     Special Requests NONE     Culture  Setup Time       Value: 02/09/2013 01:20     Performed at [facility]       Value: 50,000 COLONIES/ML     Performed at [facility]   Culture       Value: GROUP B STREP(S.AGALACTIAE)ISOLATED  Note: TESTING AGAINST S. AGALACTIAE NOT ROUTINELY PERFORMED DUE TO PREDICTABILITY OF AMP/PEN/VAN SUSCEPTIBILITY.     Performed at [facility]   Report Status 02/11/2013 FINAL    URINALYSIS, ROUTINE W REFLEX MICROSCOPIC   Collection Time    02/10/13  1:04 AM      Result Value Ref Range   Color, Urine YELLOW  YELLOW   APPearance CLEAR  CLEAR   Specific Gravity, Urine 1.010  1.005 - 1.030   pH 6.5  5.0 - 8.0   Glucose, UA NEGATIVE  NEGATIVE mg/dL   Hgb urine dipstick NEGATIVE  NEGATIVE   Bilirubin Urine NEGATIVE  NEGATIVE   Ketones, ur NEGATIVE  NEGATIVE mg/dL   Protein, ur NEGATIVE  NEGATIVE mg/dL   Urobilinogen, UA 0.2  0.0 - 1.0 mg/dL   Nitrite NEGATIVE  NEGATIVE   Leukocytes, UA NEGATIVE  NEGATIVE  CBC   Collection Time    02/10/13  4:59 AM      Result Value Ref Range   WBC 5.1  4.0 - 10.5 K/uL   RBC 3.73 (*) 3.87 - 5.11 MIL/uL   Hemoglobin 10.8 (*) 12.0 - 15.0 g/dL   HCT 16.1 (*) 09.6 - 04.5 %   MCV 93.8  78.0 - 100.0 fL   MCH 29.0  26.0 - 34.0 pg   MCHC 30.9  30.0 - 36.0 g/dL   RDW 40.9  81.1 - 91.4 %   Platelets 213  150 - 400 K/uL  COMPREHENSIVE METABOLIC PANEL   Collection Time    02/10/13  4:59 AM      Result Value Ref Range   Sodium 141  135 - 145 mEq/L   Potassium 4.3  3.5 - 5.1 mEq/L   Chloride 108  96 - 112 mEq/L   CO2 27  19 - 32 mEq/L   Glucose, Bld 83  70 - 99 mg/dL   BUN 25 (*) 6 - 23 mg/dL   Creatinine, Ser 7.82 (*) 0.50 - 1.10 mg/dL   Calcium 9.9  8.4 - 95.6 mg/dL   Total Protein 6.3  6.0 - 8.3 g/dL   Albumin 3.0 (*) 3.5 - 5.2 g/dL   AST 16  0 - 37 U/L   ALT 7  0 - 35 U/L   Alkaline Phosphatase 113  39 - 117 U/L   Total Bilirubin 0.3  0.3 - 1.2 mg/dL   GFR calc non Af Amer 32 (*) >90 mL/min   GFR calc Af Amer 37 (*) >90 mL/min  VITAMIN B12   Collection Time    02/10/13  1:01 PM      Result Value Ref Range   Vitamin B-12 1682 (*) 211 - 911 pg/mL  FOLATE   Collection Time    02/10/13  1:01 PM      Result Value Ref Range    Folate >20.0    IRON AND TIBC   Collection Time    02/10/13  1:01 PM      Result Value Ref Range   Iron 33 (*) 42 - 135 ug/dL   TIBC 213 (*) 086 - 578 ug/dL   Saturation Ratios 18 (*) 20 - 55 %   UIBC 149  125 - 400 ug/dL  FERRITIN   Collection Time    02/10/13  1:01 PM      Result Value Ref Range   Ferritin 51  10 - 291 ng/mL  RETICULOCYTES   Collection Time    02/10/13  1:01 PM  Result Value Ref Range   Retic Ct Pct 0.8  0.4 - 3.1 %   RBC. 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 We'll continue all medications but cut down clonazepam to 0.5 mg twice a day instead of 3 times a day as needed Discussed the risk and benefits of the medication in detail.    Time spent 25 minutes.  More than 50% of the time spent and psychoeducation, counseling and coordination of care. She'll return in to 3 months  MEDICATIONS this encounter: Meds ordered this encounter  Medications  . benztropine (COGENTIN) 0.5 MG tablet    Sig: Take 1 tablet (0.5 mg total) by mouth 2 (two) times daily.    Dispense:  60 tablet    Refill:  1  . buPROPion (WELLBUTRIN XL) 150  MG 24 hr tablet    Sig: Take 1 tablet (150 mg total) by mouth every morning.    Dispense:  30 tablet    Refill:  2  . lamoTRIgine (LAMICTAL) 100 MG tablet    Sig: Take 1 tablet (100 mg total) by mouth 2 (two) times daily.    Dispense:  60 tablet    Refill:  1  . PARoxetine (PAXIL) 40 MG tablet    Sig: Take 1.5 tablets (60 mg total) by mouth at bedtime.    Dispense:  45 tablet    Refill:  2  . risperiDONE (RISPERDAL) 0.25 MG tablet    Sig: Take 1 tablet (0.25 mg total) by mouth 3 (three) times daily. Takes at 8:00AM, 2:00PM, 5:00PM.    Dispense:  90 tablet    Refill:  2  . risperiDONE (RISPERDAL) 1 MG tablet    Sig: Take 1 tablet (1 mg total) by mouth at bedtime.    Dispense:  30 tablet    Refill:  2  . clonazePAM (KLONOPIN) 0.5 MG tablet    Sig: Take 1 tablet (0.5 mg total) by mouth 2 (two) times daily as needed for anxiety.    Dispense:  60 tablet    Refill:  2    Medical Decision Making Problem Points:  Established problem, stable/improving (1), Established problem, worsening (2), New problem, with additional work-up planned (4), Review of last therapy session (1) and Review of psycho-social stressors (1) Data Points:  Review of medication regiment & side effects (2) Review of new medications or change  in dosage (2)  I certify that outpatient services furnished can reasonably be expected to improve the patient's condition.   Diannia Ruder, MD

## 2014-01-11 ENCOUNTER — Other Ambulatory Visit: Payer: Self-pay | Admitting: Family Medicine

## 2014-01-11 ENCOUNTER — Ambulatory Visit (HOSPITAL_COMMUNITY)
Admission: RE | Admit: 2014-01-11 | Discharge: 2014-01-11 | Disposition: A | Discharge status: Home / Self Care | Payer: Medicare Other | Source: Ambulatory Visit | Attending: Family Medicine | Admitting: Family Medicine

## 2014-01-11 DIAGNOSIS — R928 Other abnormal and inconclusive findings on diagnostic imaging of breast: Secondary | ICD-10-CM | POA: Insufficient documentation

## 2014-01-11 DIAGNOSIS — N6489 Other specified disorders of breast: Secondary | ICD-10-CM | POA: Diagnosis not present

## 2014-02-09 DIAGNOSIS — N3946 Mixed incontinence: Secondary | ICD-10-CM | POA: Diagnosis not present

## 2014-02-09 DIAGNOSIS — Z6838 Body mass index (BMI) 38.0-38.9, adult: Secondary | ICD-10-CM | POA: Diagnosis not present

## 2014-02-14 ENCOUNTER — Emergency Department (HOSPITAL_COMMUNITY): Payer: Medicare Other

## 2014-02-14 ENCOUNTER — Encounter (HOSPITAL_COMMUNITY): Payer: Self-pay | Admitting: Emergency Medicine

## 2014-02-14 ENCOUNTER — Emergency Department (HOSPITAL_COMMUNITY)
Admission: EM | Admit: 2014-02-14 | Discharge: 2014-02-14 | Disposition: A | Discharge status: Home / Self Care | Payer: Medicare Other | Attending: Emergency Medicine | Admitting: Emergency Medicine

## 2014-02-14 DIAGNOSIS — IMO0001 Reserved for inherently not codable concepts without codable children: Secondary | ICD-10-CM

## 2014-02-14 DIAGNOSIS — S40011A Contusion of right shoulder, initial encounter: Secondary | ICD-10-CM | POA: Insufficient documentation

## 2014-02-14 DIAGNOSIS — S199XXA Unspecified injury of neck, initial encounter: Secondary | ICD-10-CM | POA: Insufficient documentation

## 2014-02-14 DIAGNOSIS — Y92013 Bedroom of single-family (private) house as the place of occurrence of the external cause: Secondary | ICD-10-CM | POA: Insufficient documentation

## 2014-02-14 DIAGNOSIS — I10 Essential (primary) hypertension: Secondary | ICD-10-CM | POA: Diagnosis not present

## 2014-02-14 DIAGNOSIS — Z79899 Other long term (current) drug therapy: Secondary | ICD-10-CM | POA: Diagnosis not present

## 2014-02-14 DIAGNOSIS — Z9181 History of falling: Secondary | ICD-10-CM | POA: Diagnosis not present

## 2014-02-14 DIAGNOSIS — M25511 Pain in right shoulder: Secondary | ICD-10-CM | POA: Diagnosis not present

## 2014-02-14 DIAGNOSIS — S8001XA Contusion of right knee, initial encounter: Secondary | ICD-10-CM

## 2014-02-14 DIAGNOSIS — W1830XA Fall on same level, unspecified, initial encounter: Secondary | ICD-10-CM | POA: Insufficient documentation

## 2014-02-14 DIAGNOSIS — Z7982 Long term (current) use of aspirin: Secondary | ICD-10-CM | POA: Diagnosis not present

## 2014-02-14 DIAGNOSIS — M542 Cervicalgia: Secondary | ICD-10-CM | POA: Diagnosis not present

## 2014-02-14 DIAGNOSIS — Y9301 Activity, walking, marching and hiking: Secondary | ICD-10-CM | POA: Insufficient documentation

## 2014-02-14 DIAGNOSIS — Z880 Allergy status to penicillin: Secondary | ICD-10-CM | POA: Diagnosis not present

## 2014-02-14 DIAGNOSIS — W19XXXA Unspecified fall, initial encounter: Secondary | ICD-10-CM

## 2014-02-14 DIAGNOSIS — S8991XA Unspecified injury of right lower leg, initial encounter: Secondary | ICD-10-CM | POA: Diagnosis present

## 2014-02-14 DIAGNOSIS — M79601 Pain in right arm: Secondary | ICD-10-CM | POA: Diagnosis not present

## 2014-02-14 DIAGNOSIS — S0990XA Unspecified injury of head, initial encounter: Secondary | ICD-10-CM | POA: Diagnosis not present

## 2014-02-14 DIAGNOSIS — S40021A Contusion of right upper arm, initial encounter: Secondary | ICD-10-CM

## 2014-02-14 DIAGNOSIS — S4991XA Unspecified injury of right shoulder and upper arm, initial encounter: Secondary | ICD-10-CM | POA: Diagnosis not present

## 2014-02-14 LAB — URINALYSIS, ROUTINE W REFLEX MICROSCOPIC
Bilirubin Urine: NEGATIVE
Glucose, UA: NEGATIVE mg/dL
Hgb urine dipstick: NEGATIVE
KETONES UR: NEGATIVE mg/dL
LEUKOCYTES UA: NEGATIVE
Nitrite: NEGATIVE
PROTEIN: NEGATIVE mg/dL
Specific Gravity, Urine: <1.005 — ABNORMAL LOW (ref 1.005–1.030)
UROBILINOGEN UA: 0.2 mg/dL (ref 0.0–1.0)
pH: 5.5 (ref 5.0–8.0)

## 2014-02-14 MED ORDER — TRAMADOL HCL 50 MG PO TABS: ORAL_TABLET | ORAL | Status: DC

## 2014-02-14 NOTE — ED Notes (Addendum)
Fell today, Pain rt upper arm , and rt thigh, denies LOC,  Alert, talking,  Care giver say she was able to walk to car today.  Pt is from [place].

## 2014-02-14 NOTE — Discharge Instructions (Signed)
Ice packs to the injured areas until the pain is better. Take the tramadol for pain as needed. Recheck if you continue to have pain or it gets worse. Return to the ED for any problems listed on the head injury sheet.

## 2014-02-14 NOTE — ED Provider Notes (Signed)
CSN: [id number]     Arrival date & time 02/14/14  1334 History   First MD Initiated Contact with Patient 02/14/14 1739     Chief Complaint  Patient presents with  . Fall     (Consider location/radiation/quality/duration/timing/severity/associated sxs/prior Treatment) HPI Patient reports she uses a walker and she falls frequently. She states this morning she was walking with her walker through her bedroom and for some unknown reason she fell. She denies having any dizziness or lightheadedness before hand. She denies hitting her head. She states her sister and somebody else helped get her up. She states she has pain in her neck, her right shoulder, and her right knee. She denies feeling dizzy, weak, having a headache, having chest pain, shortness of breath, nausea, vomiting, diarrhea, dysuria, frequency but she does describe urgency. She denies any visual disturbance.  PCP Dr Phillips Odor  Past Medical History  Diagnosis Date  . High blood pressure   . Arthritis   . Depression   . Anxiety   . Hemorrhoids   . Hyperlipidemia   . Bipolar 1 disorder    Past Surgical History  Procedure Laterality Date  . Back surgery  03/16/87  . Cholecystectomy  08/15/93  . Partial hysterectomy  04/04/95  . Bladder tacked  04/04/95  . Ankle surgery  04/28/97    right ankle fracture  . Wrist surgery  02/24/09    right wrist fracture  . Colonoscopy  10/26/2002    Dr. Karilyn Cota- small external hemorrhoids otherwise normal   . Breast biopsy  05/16/95    right side  . Savory dilation  10/31/2011    Procedure: SAVORY DILATION;  Surgeon: Corbin Ade, MD;  Location: [facility];  Service: Endoscopy;  Laterality: N/A;  Elease Hashimoto dilation  10/31/2011    Procedure: Elease Hashimoto DILATION;  Surgeon: Corbin Ade, MD;  Location: [facility];  Service: Endoscopy;  Laterality: N/A;  . Abdominal hysterectomy     Family History  Problem Relation Age of Onset  . Arthritis    . Asthma    . Diabetes    . Colon cancer Neg  Hx   . Anxiety disorder Paternal Aunt   . Anxiety disorder Paternal Uncle   . Bipolar disorder Cousin    History  Substance Use Topics  . Smoking status: Never Smoker   . Smokeless tobacco: Never Used  . Alcohol Use: No   Lives at home Lives with sister   OB History   Grav Para Term Preterm Abortions TAB SAB Ect Mult Living            0     Review of Systems  All other systems reviewed and are negative.     Allergies  Benzodiazepines; Ciprofloxacin; Cephalexin; and Penicillins  Home Medications   Prior to Admission medications   Medication Sig Start Date End Date Taking? Authorizing Provider  acetaminophen (TYLENOL) 500 MG tablet Take 1,000 mg by mouth every 6 (six) hours as needed for pain.   Yes Historical Provider, MD  aspirin EC 81 MG tablet Take 81 mg by mouth every morning.    Yes Historical Provider, MD  benztropine (COGENTIN) 0.5 MG tablet Take 1 tablet (0.5 mg total) by mouth 2 (two) times daily. 01/07/14 01/07/15 Yes Diannia Ruder, MD  beta carotene w/minerals (OCUVITE) tablet Take 1 tablet by mouth every morning.    Yes Historical Provider, MD  Biotin 5000 MCG CAPS Take 1 capsule by mouth every morning.   Yes Historical  Provider, MD  buPROPion (WELLBUTRIN XL) 150 MG 24 hr tablet Take 1 tablet (150 mg total) by mouth every morning. 01/07/14 01/07/15 Yes Diannia Rudereborah Ross, MD  calcium carbonate (OS-CAL) 600 MG TABS Take 1,200 mg by mouth daily with breakfast.    Yes Historical Provider, MD  Cholecalciferol (VITAMIN D) 2000 UNITS CAPS Take 1 capsule by mouth every morning.    Yes Historical Provider, MD  clonazePAM (KLONOPIN) 0.5 MG tablet Take 1 tablet (0.5 mg total) by mouth 2 (two) times daily as needed for anxiety. 01/07/14  Yes Diannia Rudereborah Ross, MD  docusate sodium (STOOL SOFTENER) 100 MG capsule Take 200 mg by mouth at bedtime.   Yes Historical Provider, MD  furosemide (LASIX) 20 MG tablet Take 20 mg by mouth every Monday, Wednesday, and Friday.  01/26/14  Yes Historical  Provider, MD  lamoTRIgine (LAMICTAL) 100 MG tablet Take 1 tablet (100 mg total) by mouth 2 (two) times daily. 01/07/14  Yes Diannia Rudereborah Ross, MD  losartan (COZAAR) 100 MG tablet Take 100 mg by mouth every morning.  10/04/11  Yes Historical Provider, MD  multivitamin (PROSIGHT) TABS tablet Take 1 tablet by mouth daily.   Yes Historical Provider, MD  omeprazole (PRILOSEC) 40 MG capsule Take 40 mg by mouth daily.   Yes Historical Provider, MD  PARoxetine (PAXIL) 40 MG tablet Take 1.5 tablets (60 mg total) by mouth at bedtime. 01/07/14  Yes Diannia Rudereborah Ross, MD  risperiDONE (RISPERDAL) 0.25 MG tablet Take 1 tablet (0.25 mg total) by mouth 3 (three) times daily. Takes at 8:00AM, 2:00PM, 5:00PM. 01/07/14  Yes Diannia Rudereborah Ross, MD  risperiDONE (RISPERDAL) 1 MG tablet Take 1 tablet (1 mg total) by mouth at bedtime. 01/07/14  Yes Diannia Rudereborah Ross, MD  solifenacin (VESICARE) 10 MG tablet Take 10 mg by mouth every morning.    Yes Historical Provider, MD  terazosin (HYTRIN) 2 MG capsule Take 2 mg by mouth at bedtime.    Yes Historical Provider, MD  vitamin B-12 (CYANOCOBALAMIN) 1000 MCG tablet Take 1,000 mcg by mouth daily.   Yes Historical Provider, MD  vitamin C (ASCORBIC ACID) 500 MG tablet Take 1,000 mg by mouth daily.    Yes Historical Provider, MD  traMADol (ULTRAM) 50 MG tablet Take 1 or 2 po Q 6hrs for pain 02/14/14   Ward GivensIva L Jarmon Javid, MD   BP 152/74  Pulse 51  Resp 20  Ht 5\' 6"  (1.676 m)  Wt 230 lb (104.327 kg)  BMI 37.14 kg/m2  SpO2 100%  Vital signs normal   Physical Exam  Nursing note and vitals reviewed. Constitutional: She is oriented to person, place, and time. She appears well-developed and well-nourished.  Non-toxic appearance. She does not appear ill. No distress.  HENT:  Head: Normocephalic and atraumatic.  Right Ear: External ear normal.  Left Ear: External ear normal.  Nose: Nose normal. No mucosal edema or rhinorrhea.  Mouth/Throat: Oropharynx is clear and moist and mucous membranes are normal. No  dental abscesses or uvula swelling.  Eyes: Conjunctivae and EOM are normal. Pupils are equal, round, and reactive to light.  Neck: Normal range of motion and full passive range of motion without pain. Neck supple.  Patient has some tenderness of her lower spine without step off or crepitance. She has normal range of motion.  Cardiovascular: Normal rate, regular rhythm and normal heart sounds.  Exam reveals no gallop and no friction rub.   No murmur heard. Pulmonary/Chest: Effort normal and breath sounds normal. No respiratory distress. She has no wheezes.  She has no rhonchi. She has no rales. She exhibits no tenderness and no crepitus.  Abdominal: Soft. Normal appearance and bowel sounds are normal. She exhibits no distension. There is no tenderness. There is no rebound and no guarding.  Musculoskeletal: Normal range of motion. She exhibits tenderness. She exhibits no edema.  Moves all extremities well. Patient complains of pain in her right shoulder, she has some pain on range of motion. There is no bruising, swelling, or deformity. The scapula is nontender. Patient has a small abrasion on her right anterior knee about the size of a nickel. She has diffuse tenderness of her knee without knee effusion present.  Neurological: She is alert and oriented to person, place, and time. She has normal strength. No cranial nerve deficit.  Skin: Skin is warm, dry and intact. No rash noted. No erythema. No pallor.  Psychiatric: She has a normal mood and affect. Her speech is normal and behavior is normal. Her mood appears not anxious.    ED Course  Procedures (including critical care time)  Pt given her xray results.    Labs Review Results for orders placed during the hospital encounter of 02/14/14  URINALYSIS, ROUTINE W REFLEX MICROSCOPIC      Result Value Ref Range   Color, Urine STRAW (*) YELLOW   APPearance CLEAR  CLEAR   Specific Gravity, Urine <1.005 (*) 1.005 - 1.030   pH 5.5  5.0 - 8.0    Glucose, UA NEGATIVE  NEGATIVE mg/dL   Hgb urine dipstick NEGATIVE  NEGATIVE   Bilirubin Urine NEGATIVE  NEGATIVE   Ketones, ur NEGATIVE  NEGATIVE mg/dL   Protein, ur NEGATIVE  NEGATIVE mg/dL   Urobilinogen, UA 0.2  0.0 - 1.0 mg/dL   Nitrite NEGATIVE  NEGATIVE   Leukocytes, UA NEGATIVE  NEGATIVE    Laboratory interpretation all normal    Imaging Review Dg Shoulder Right  02/14/2014   CLINICAL DATA:  Right shoulder pain after falling yesterday in her bedroom with a walker.  EXAM: RIGHT SHOULDER - 2+ VIEW  COMPARISON:  Right humerus radiographs obtained earlier today.  FINDINGS: Mild diffuse osteopenia.  No fracture or dislocation seen.  IMPRESSION: No fracture or dislocation.   Electronically Signed   By: Gordan Payment M.D.   On: 02/14/2014 19:01   Ct Cervical Spine Wo Contrast  02/14/2014   CLINICAL DATA:  Initial encounter for fall today at a nursing home. Fall was from the same level. Patient denies any loss of consciousness.  EXAM: CT CERVICAL SPINE WITHOUT CONTRAST  TECHNIQUE: Multidetector CT imaging of the cervical spine was performed without intravenous contrast. Multiplanar CT image reconstructions were also generated.  COMPARISON:  Cervical spine CT 11/17/2013.  FINDINGS: The cervical spine is imaged from the skullbase through T1-2. Endplate degenerative changes are again noted from C3-4 through C6-7. Vertebral body heights and alignment are maintained. Uncovertebral disease is most evident at C3-4 and C4-5. No acute fracture or traumatic subluxation is evident. The the soft tissues of the neck demonstrate stable appearance of multiple small thyroid nodules. Minimal atherosclerotic calcifications are noted.  IMPRESSION: 1. Stable degenerative changes of the cervical spine. 2. No acute abnormality or significant interval change.   Electronically Signed   By: Gennette Pac M.D.   On: 02/14/2014 18:50   Dg Knee Complete 4 Views Right  02/14/2014   CLINICAL DATA:  Status post fall today.   Right knee pain.  EXAM: RIGHT KNEE - COMPLETE 4+ VIEW  COMPARISON:  Plain films  right knee 02/09/2013.  FINDINGS: Soft tissue swelling is seen anterior to the patella. There is no fracture. No joint effusion is identified. Mild chondrocalcinosis and degenerative change about the knee noted.  IMPRESSION: Soft tissue swelling anterior to patella could be due to contusion or hemorrhagic bursitis.  Negative for fracture.  Mild osteoarthritis and chondrocalcinosis.   Electronically Signed   By: Drusilla Kanner M.D.   On: 02/14/2014 14:36   Dg Humerus Right  02/14/2014   CLINICAL DATA:  Pain status post recent fall  EXAM: RIGHT HUMERUS - 2+ VIEW  COMPARISON:  None.  FINDINGS: Frontal and lateral views were obtained. There is no fracture or dislocation. There is mild osteoarthritic change in the right shoulder. No erosive change.  IMPRESSION: Mild osteoarthritic change right shoulder. No fracture or dislocation.   Electronically Signed   By: Bretta Bang M.D.   On: 02/14/2014 14:38     EKG Interpretation None      MDM   Final diagnoses:  Fall, initial encounter  Contusion, knee, right, initial encounter  Contusion shoulder/arm, right, initial encounter  Neck pain    Plan discharge  Devoria Albe, MD, Concha Pyo, MD, Franz Dell, MD 02/14/14 2223

## 2014-02-14 NOTE — ED Notes (Signed)
[facility]; will send someone to pick up.

## 2014-02-18 DIAGNOSIS — Z681 Body mass index (BMI) 19 or less, adult: Secondary | ICD-10-CM | POA: Diagnosis not present

## 2014-02-18 DIAGNOSIS — I10 Essential (primary) hypertension: Secondary | ICD-10-CM | POA: Diagnosis not present

## 2014-02-18 DIAGNOSIS — R600 Localized edema: Secondary | ICD-10-CM | POA: Diagnosis not present

## 2014-02-18 DIAGNOSIS — T148 Other injury of unspecified body region: Secondary | ICD-10-CM | POA: Diagnosis not present

## 2014-02-22 DIAGNOSIS — Z23 Encounter for immunization: Secondary | ICD-10-CM | POA: Diagnosis not present

## 2014-03-01 DIAGNOSIS — B351 Tinea unguium: Secondary | ICD-10-CM | POA: Diagnosis not present

## 2014-03-01 DIAGNOSIS — M79672 Pain in left foot: Secondary | ICD-10-CM | POA: Diagnosis not present

## 2014-03-01 DIAGNOSIS — M79675 Pain in left toe(s): Secondary | ICD-10-CM | POA: Diagnosis not present

## 2014-03-09 ENCOUNTER — Ambulatory Visit (INDEPENDENT_AMBULATORY_CARE_PROVIDER_SITE_OTHER): Payer: Medicare Other | Admitting: Psychiatry

## 2014-03-09 ENCOUNTER — Encounter (HOSPITAL_COMMUNITY): Payer: Self-pay | Admitting: Psychiatry

## 2014-03-09 VITALS — BP 110/62 | HR 77 | Ht 66.0 in

## 2014-03-09 DIAGNOSIS — F5105 Insomnia due to other mental disorder: Secondary | ICD-10-CM

## 2014-03-09 DIAGNOSIS — F331 Major depressive disorder, recurrent, moderate: Secondary | ICD-10-CM

## 2014-03-09 DIAGNOSIS — F1390 Sedative, hypnotic, or anxiolytic use, unspecified, uncomplicated: Secondary | ICD-10-CM

## 2014-03-09 DIAGNOSIS — F333 Major depressive disorder, recurrent, severe with psychotic symptoms: Secondary | ICD-10-CM

## 2014-03-09 DIAGNOSIS — F419 Anxiety disorder, unspecified: Secondary | ICD-10-CM

## 2014-03-09 MED ORDER — LAMOTRIGINE 100 MG PO TABS: 100.0000 mg | ORAL_TABLET | Freq: Two times a day (BID) | ORAL | Status: DC

## 2014-03-09 MED ORDER — BENZTROPINE MESYLATE 0.5 MG PO TABS: 0.5000 mg | ORAL_TABLET | Freq: Two times a day (BID) | ORAL | Status: DC

## 2014-03-09 MED ORDER — CLONAZEPAM 0.5 MG PO TABS: 0.5000 mg | ORAL_TABLET | Freq: Two times a day (BID) | ORAL | Status: DC | PRN

## 2014-03-09 MED ORDER — RISPERIDONE 0.25 MG PO TABS: 0.2500 mg | ORAL_TABLET | Freq: Three times a day (TID) | ORAL | Status: DC

## 2014-03-09 MED ORDER — RISPERIDONE 1 MG PO TABS: 1.0000 mg | ORAL_TABLET | Freq: Every day | ORAL | Status: DC

## 2014-03-09 MED ORDER — BUPROPION HCL ER (XL) 300 MG PO TB24: 300.0000 mg | ORAL_TABLET | ORAL | Status: DC

## 2014-03-09 MED ORDER — PAROXETINE HCL 40 MG PO TABS: 40.0000 mg | ORAL_TABLET | Freq: Every day | ORAL | Status: DC

## 2014-03-09 NOTE — Progress Notes (Signed)
Patient ID: Amy Clay, female   DOB: 11-Dec-1940, 73 y.o.   MRN: [id number] Patient ID: Amy Clay, female   DOB: [birth year], 73 y.o.   MRN: [id number] Patient ID: Amy Clay, female   DOB: 12-05-1940, 73 y.o.   MRN: [id number] Patient ID: Amy Clay, female   DOB: 1940-08-15, 73 y.o.   MRN: [id number] Patient ID: Amy Clay, female   DOB: 05/19/1940, 73 y.o.   MRN: [id number] Patient ID: Amy Clay, female   DOB: 12-10-1940, 73 y.o.   MRN: [id number] Patient ID: Amy Clay, female   DOB: [birth year], 73 y.o.   MRN: [id number] Patient ID: Amy Clay, female   DOB: Jun 26, 1940, 73 y.o.   MRN: [id number] [facility] [address] Progress Note Amy Clay MRN: [id number] DOB: 1941-02-05 Age: 73 y.o.  Date: 03/09/2014  Chief Complaint  Patient presents with  . Anxiety  . Depression  . Follow-up   History of presenting illness Patient is 73 year old Caucasian female who came with her sister for her followup appointment. She lives in the [facility]. She and her sister had been living together in the family farm outside of [place]. Neither one has ever been married or have had children. The patient worked until the early 80s when she began to have difficulties with her nerves.  Currently the patient is still struggling with her mental and physical health. She was admitted last year to a psychiatric hospital in [place] because she was anxious nervous and depressed. Last time Dr. Lolly Mustache increased her Risperdal which is helped somewhat. She can't walk and it's not clear why much of it seems to be anxiety based and she seems to have a fear of falling. She's also recently developed possible renal failure and is can be referred to a nephrologist. She was on lithium for a number of years. We do not have her  recent records from her primary care physician who is outside of [place]  The patient returns after 2 months. She is  here with her aide Rosey Bath. Her mood has been low lately and she's been crying more. She doesn't seem to have the energy to do much but talk to many people. She claims she is depressed because she is not able to walk well and also her sisters having walking problems and is been going to a lot of doctors. Her sister has not been able to visit as often. She only gets the clonazepam twice a day. She is sleeping well at night seems very blunted today  Past psychiatric history Patient is seeing psychiatrist since 1997 in this office.  Her last psychiatric admission was at [facility] after having suicidal thoughts.  She denies any history of suicidal attempt however endorse history of hopeless feeling.  She is diagnosed with major depressive disorder. She she was very stable on lithium until her creatinine went up and lithium was discontinued.  We had recently tried Lamictal and the dose has been increased recently.  In the past we had tried BuSpar which was discontinued at hospital.  She has another psychiatric inpatient treatment in 90s. She admitted history of mania, depression and psychosis.  Social history Patient lives in a nursing home.  She has no children.  Her husband died in nursing home.  Patient has no other family.  Medical history Patient was recently admitted due to dehydration and UTI.  Patient has history of arthritis blood pressure and chronic pain. Her  primary care physician [facility].  She takes medication for her blood pressure and arthritis.  She had history of jerking movements, fall and generalized pain. Her last blood work was done on 02/14/2012 which shows anemia, her creatinine was 1.33.  She was given antibiotic result UTI.  Her WBC count was 10.4.  Family History family history includes Anxiety disorder in her paternal aunt and paternal uncle; Arthritis in an other family member; Asthma in an other family member; Bipolar disorder  in her cousin; Diabetes in an other family member. There is no history of Colon cancer.  Review of Systems  Constitutional: Positive for malaise/fatigue.  Skin: Negative.   Neurological: Positive for tremors, weakness and headaches.  Psychiatric/Behavioral: Positive for memory loss. Negative for suicidal ideas, hallucinations and substance abuse. The patient is nervous/anxious. The patient does not have insomnia.      Mental status examination Patient is casually dressed and fairly groomed. She is in a wheelchair   She described her mood as low  Her voice is high-pitched and soft.  .Her affect is blunted and depressed.  Her attention and concentration are poor today  She expresses herself well .  She doesn't have auditory or visual hallucination.  There were no paranoia or delusion obsession present at this time.  Her fund of knowledge is okay.  There are no tremors and shakes in her hand.  She's alert and oriented x3. However she seems more confused and at one point stated she was still living with her sister on the farm  Her insight judgment are poor and impulse control is okay.  Lab Results:  Results for orders placed during the hospital encounter of 02/14/14 (from the past 8736 hour(s))  URINALYSIS, ROUTINE W REFLEX MICROSCOPIC   Collection Time    02/14/14  8:22 PM      Result Value Ref Range   Color, Urine STRAW (*) YELLOW   APPearance CLEAR  CLEAR   Specific Gravity, Urine <1.005 (*) 1.005 - 1.030   pH 5.5  5.0 - 8.0   Glucose, UA NEGATIVE  NEGATIVE mg/dL   Hgb urine dipstick NEGATIVE  NEGATIVE   Bilirubin Urine NEGATIVE  NEGATIVE   Ketones, ur NEGATIVE  NEGATIVE mg/dL   Protein, ur NEGATIVE  NEGATIVE mg/dL   Urobilinogen, UA 0.2  0.0 - 1.0 mg/dL   Nitrite NEGATIVE  NEGATIVE   Leukocytes, UA NEGATIVE  NEGATIVE  Results for orders placed during the hospital encounter of 11/29/13 (from the past 8736 hour(s))  BASIC METABOLIC PANEL   Collection Time    11/29/13 12:36 PM       Result Value Ref Range   Sodium 140  137 - 147 mEq/L   Potassium 4.7  3.7 - 5.3 mEq/L   Chloride 104  96 - 112 mEq/L   CO2 28  19 - 32 mEq/L   Glucose, Bld 94  70 - 99 mg/dL   BUN 32 (*) 6 - 23 mg/dL   Creatinine, Ser 6.961.34 (*) 0.50 - 1.10 mg/dL   Calcium 9.9  8.4 - 29.510.5 mg/dL   GFR calc non Af Amer 38 (*) >90 mL/min   GFR calc Af Amer 44 (*) >90 mL/min   Anion gap 8  5 - 15  PROTIME-INR   Collection Time    11/29/13 12:36 PM      Result Value Ref Range   Prothrombin Time 13.2  11.6 - 15.2 seconds   INR 1.00  0.00 -  1.49  Results for orders placed during the hospital encounter of 07/18/13 (from the past 8736 hour(s))  CBC   Collection Time    07/18/13  8:49 AM      Result Value Ref Range   WBC 7.0  4.0 - 10.5 K/uL   RBC 3.89  3.87 - 5.11 MIL/uL   Hemoglobin 11.2 (*) 12.0 - 15.0 g/dL   HCT 40.9  81.1 - 91.4 %   MCV 94.1  78.0 - 100.0 fL   MCH 28.8  26.0 - 34.0 pg   MCHC 30.6  30.0 - 36.0 g/dL   RDW 78.2  95.6 - 21.3 %   Platelets 230  150 - 400 K/uL  COMPREHENSIVE METABOLIC PANEL   Collection Time    07/18/13  8:49 AM      Result Value Ref Range   Sodium 141  137 - 147 mEq/L   Potassium 4.5  3.7 - 5.3 mEq/L   Chloride 104  96 - 112 mEq/L   CO2 27  19 - 32 mEq/L   Glucose, Bld 126 (*) 70 - 99 mg/dL   BUN 37 (*) 6 - 23 mg/dL   Creatinine, Ser 0.86 (*) 0.50 - 1.10 mg/dL   Calcium 57.8  8.4 - 46.9 mg/dL   Total Protein 7.2  6.0 - 8.3 g/dL   Albumin 3.3 (*) 3.5 - 5.2 g/dL   AST 71 (*) 0 - 37 U/L   ALT 71 (*) 0 - 35 U/L   Alkaline Phosphatase 127 (*) 39 - 117 U/L   Total Bilirubin 0.2 (*) 0.3 - 1.2 mg/dL   GFR calc non Af Amer 30 (*) >90 mL/min   GFR calc Af Amer 35 (*) >90 mL/min  TROPONIN I   Collection Time    07/18/13  8:49 AM      Result Value Ref Range   Troponin I <0.30  <0.30 ng/mL  CBG MONITORING, ED   Collection Time    07/18/13  8:58 AM      Result Value Ref Range   Glucose-Capillary 118 (*) 70 - 99 mg/dL  URINE CULTURE   Collection Time    07/18/13   9:12 AM      Result Value Ref Range   Specimen Description URINE, CATHETERIZED     Special Requests NONE     Culture  Setup Time       Value: 07/18/2013 23:14     Performed at [facility] Count       Value: >=100,000 COLONIES/ML     Performed at [facility]   Culture       Value: VIRIDANS STREPTOCOCCUS     Performed at [facility]   Report Status 07/19/2013 FINAL    URINALYSIS, ROUTINE W REFLEX MICROSCOPIC   Collection Time    07/18/13  9:12 AM      Result Value Ref Range   Color, Urine YELLOW  YELLOW   APPearance CLOUDY (*) CLEAR   Specific Gravity, Urine 1.010  1.005 - 1.030   pH 6.5  5.0 - 8.0   Glucose, UA NEGATIVE  NEGATIVE mg/dL   Hgb urine dipstick NEGATIVE  NEGATIVE   Bilirubin Urine NEGATIVE  NEGATIVE   Ketones, ur NEGATIVE  NEGATIVE mg/dL   Protein, ur NEGATIVE  NEGATIVE mg/dL   Urobilinogen, UA 0.2  0.0 - 1.0 mg/dL   Nitrite NEGATIVE  NEGATIVE   Leukocytes, UA NEGATIVE  NEGATIVE   she had labs done last  week with her primary care physician we will get the result Diagnoses 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 We'll continue all medications but increase Wellbutrin XL to 150 mg every morning and decrease Paxil to 40 mg daily at bedtime. This may help her mood and energy Discussed the risk and benefits of the medication in detail.    Time spent 25 minutes.  More than 50% of the time spent and psychoeducation, counseling and coordination of care. She'll return in 4 weeks  MEDICATIONS this encounter: Meds ordered this encounter  Medications  . buPROPion (WELLBUTRIN XL) 300 MG 24 hr tablet    Sig: Take 1 tablet (300 mg total) by mouth every morning.    Dispense:  30 tablet     Refill:  2  . PARoxetine (PAXIL) 40 MG tablet    Sig: Take 1 tablet (40 mg total) by mouth at bedtime.    Dispense:  30 tablet    Refill:  2  . risperiDONE (RISPERDAL) 1 MG tablet    Sig: Take 1 tablet (1 mg total) by mouth at bedtime.    Dispense:  30 tablet    Refill:  2  . benztropine (COGENTIN) 0.5 MG tablet    Sig: Take 1 tablet (0.5 mg total) by mouth 2 (two) times daily.    Dispense:  60 tablet    Refill:  2  . lamoTRIgine (LAMICTAL) 100 MG tablet    Sig: Take 1 tablet (100 mg total) by mouth 2 (two) times daily.    Dispense:  60 tablet    Refill:  2  . risperiDONE (RISPERDAL) 0.25 MG tablet    Sig: Take 1 tablet (0.25 mg total) by mouth 3 (three) times daily. Takes at 8:00AM, 2:00PM, 5:00PM.    Dispense:  90 tablet    Refill:  2  . clonazePAM (KLONOPIN) 0.5 MG tablet    Sig: Take 1 tablet (0.5 mg total) by mouth 2 (two) times daily as needed for anxiety.    Dispense:  60 tablet    Refill:  2    Medical Decision Making Problem Points:  Established problem, stable/improving (1), Established problem, worsening (2), New problem, with additional work-up planned (4), Review of last therapy session (1) and Review of psycho-social stressors (1) Data Points:  Review of medication regiment & side effects (2) Review of new medications or change in dosage (2)  I certify that outpatient services furnished can reasonably be expected to improve the patient's condition.   Diannia RuderOSS, DEBORAH, MD

## 2014-03-12 ENCOUNTER — Encounter (HOSPITAL_COMMUNITY): Payer: Self-pay | Admitting: Emergency Medicine

## 2014-03-12 ENCOUNTER — Emergency Department (HOSPITAL_COMMUNITY): Payer: Medicare Other

## 2014-03-12 ENCOUNTER — Emergency Department (HOSPITAL_COMMUNITY)
Admission: EM | Admit: 2014-03-12 | Discharge: 2014-03-12 | Disposition: A | Discharge status: Other Acute Inpatient Hospital | Payer: Medicare Other | Attending: Emergency Medicine | Admitting: Emergency Medicine

## 2014-03-12 DIAGNOSIS — W01198A Fall on same level from slipping, tripping and stumbling with subsequent striking against other object, initial encounter: Secondary | ICD-10-CM | POA: Insufficient documentation

## 2014-03-12 DIAGNOSIS — S0003XA Contusion of scalp, initial encounter: Secondary | ICD-10-CM | POA: Insufficient documentation

## 2014-03-12 DIAGNOSIS — Y998 Other external cause status: Secondary | ICD-10-CM | POA: Insufficient documentation

## 2014-03-12 DIAGNOSIS — R079 Chest pain, unspecified: Secondary | ICD-10-CM | POA: Diagnosis not present

## 2014-03-12 DIAGNOSIS — S298XXA Other specified injuries of thorax, initial encounter: Secondary | ICD-10-CM

## 2014-03-12 DIAGNOSIS — R51 Headache: Secondary | ICD-10-CM | POA: Insufficient documentation

## 2014-03-12 DIAGNOSIS — R220 Localized swelling, mass and lump, head: Secondary | ICD-10-CM | POA: Diagnosis not present

## 2014-03-12 DIAGNOSIS — T07 Unspecified multiple injuries: Secondary | ICD-10-CM | POA: Diagnosis present

## 2014-03-12 DIAGNOSIS — Y939 Activity, unspecified: Secondary | ICD-10-CM | POA: Insufficient documentation

## 2014-03-12 DIAGNOSIS — Y929 Unspecified place or not applicable: Secondary | ICD-10-CM | POA: Diagnosis not present

## 2014-03-12 DIAGNOSIS — S0990XA Unspecified injury of head, initial encounter: Secondary | ICD-10-CM | POA: Diagnosis not present

## 2014-03-12 DIAGNOSIS — R259 Unspecified abnormal involuntary movements: Secondary | ICD-10-CM | POA: Diagnosis not present

## 2014-03-12 DIAGNOSIS — W19XXXA Unspecified fall, initial encounter: Secondary | ICD-10-CM

## 2014-03-12 DIAGNOSIS — S098XXA Other specified injuries of head, initial encounter: Secondary | ICD-10-CM

## 2014-03-12 NOTE — ED Notes (Signed)
[facility] made aware of pt's transfer to [facility].

## 2014-03-12 NOTE — ED Notes (Signed)
MD at bedside explaining need to transfer to [facility] for [facility] to PT and Family member at bedside.

## 2014-03-12 NOTE — ED Provider Notes (Signed)
1:45 PM Patient seen. Pt from [facility] for head CT -- their scanner is down. Patient from assisted living facility. She states she was standing, unassisted, from a lift chair. She lost her balance and fell onto L side, striking side and head. She does not have hip or lower/upper extremity pain.   CT head reviewed and is negative. Questionable early PNA noted on CXR however patient has not had fever, cough, and exam is not consistent with PNA. I would not treat this at this time. Patient and findings discussed with Dr. Karma GanjaLinker. Feel patient can be discharged back to her assisted living facility.   Exam:  Gen NAD; Head small contusion top of scalp, no depression or deformity; Heart RRR, nml S1,S2, no m/r/g; Lungs CTAB. Mild soreness L lateral rib cage without step-off; Abd soft, NT, no rebound or guarding; Ext 2+ pedal pulses bilaterally, no edema.    Renne CriglerJoshua Matthe Sloane, PA-C 03/12/14 1348

## 2014-03-12 NOTE — ED Notes (Signed)
Patient eating lunch tray.

## 2014-03-12 NOTE — ED Notes (Signed)
[facility], [phone].  [facility] dispatched to transport pt.

## 2014-03-12 NOTE — ED Notes (Signed)
Pt departing with [facility] at this time.

## 2014-03-12 NOTE — ED Notes (Addendum)
[facility] on downtime at [facility]. ETA 15min.

## 2014-03-12 NOTE — Discharge Instructions (Signed)
Please read and follow all provided instructions.  Your diagnoses today include:  1. Scalp hematoma, initial encounter   2. Blunt head trauma, initial encounter   3. Fall   4. Blunt trauma to chest, initial encounter   5. Chest pain     Tests performed today include:  CT scan of your head that did not show any serious injury.  Chest x-ray - no broken ribs seen  Vital signs. See below for your results today.   Medications prescribed:   None  Take any prescribed medications only as directed.  Home care instructions:  Follow any educational materials contained in this packet.  Use over-the-counter medications as needed for pain.   BE VERY CAREFUL not to take multiple medicines containing Tylenol (also called acetaminophen). Doing so can lead to an overdose which can damage your liver and cause liver failure and possibly death.   Follow-up instructions: Please follow-up with your primary care provider in the next 3 days for further evaluation of your symptoms.   Return instructions:  SEEK IMMEDIATE MEDICAL ATTENTION IF:  There is confusion or drowsiness (although children frequently become drowsy after injury).   You cannot awaken the injured person.   You have more than one episode of vomiting.   You notice dizziness or unsteadiness which is getting worse, or inability to walk.   You have convulsions or unconsciousness.   You experience severe, persistent headaches not relieved by Tylenol.  You cannot use arms or legs normally.   There are changes in pupil sizes. (This is the black center in the colored part of the eye)   There is clear or bloody discharge from the nose or ears.   You have change in speech, vision, swallowing, or understanding.   Localized weakness, numbness, tingling, or change in bowel or bladder control.  You have any other emergent concerns.  Additional Information: You have had a head injury which does not appear to require admission at  this time.  Your vital signs today were: BP 132/74   Pulse 64   Temp(Src) 97.6 F (36.4 C) (Oral)   Resp 14   Ht 5\' 6"  (1.676 m)   Wt 230 lb (104.327 kg)   BMI 37.14 kg/m2   SpO2 100% If your blood pressure (BP) was elevated above 135/85 this visit, please have this repeated by your doctor within one month. --------------

## 2014-03-12 NOTE — ED Notes (Signed)
Verbal order from Dr. Fonnie JarvisBednar pt may transfer without telemetry with [facility].

## 2014-03-12 NOTE — ED Provider Notes (Signed)
Medical screening examination/treatment/procedure(s) were performed by non-physician practitioner and as supervising physician I was immediately available for consultation/collaboration.   EKG Interpretation   Date/Time:  Saturday March 12 2014 10:20:30 EDT Ventricular Rate:  76 PR Interval:  190 QRS Duration: 89 QT Interval:  393 QTC Calculation: 442 R Axis:   19 Text Interpretation:  Sinus rhythm Normal ECG No significant change since  last tracing Confirmed by [facility]  MD, Jonny RuizJOHN ([id number]) on 03/12/2014 10:41:51  AM       Ethelda ChickMartha K Linker, MD 03/12/14 1356

## 2014-03-12 NOTE — ED Notes (Signed)
Pt received from [facility] for [facility] head via [facility].  Pt fell getting out of bed this morning.  Hit top of head, hematoma noted.  C/o left iliac crest pain, no bruising or swelling noted.  Pt doesn't remember what she hit when fell.

## 2014-03-12 NOTE — ED Notes (Signed)
PT and caretaker stated pt was getting out of bed this am to get dressed and fell and hit her head. PT and caretaker denies LOC. PT alert and oriented. PT c/o pain to the top of her scalp with small hematoma noted.

## 2014-03-12 NOTE — ED Notes (Signed)
[facility]

## 2014-03-12 NOTE — ED Notes (Signed)
[facility] arrival at this time to transport pt.

## 2014-03-12 NOTE — ED Provider Notes (Signed)
CSN: [id number]     Arrival date & time 03/12/14  1007 History  This chart was scribed for Amy HornJohn M Rainie Crenshaw, MD by Roxy Cedarhandni Bhalodia, ED Scribe. This patient was seen in room [facility] and the patient's care was started at 10:35 AM.   Chief Complaint  Patient presents with  . Fall   Patient is a 73 y.o. female presenting with fall. The history is provided by the patient. No language interpreter was used.  Fall    HPI Comments: Sinclair ShipFrances C Clay is a 73 y.o. female who presents to the Emergency Department complaining of headache and left side pain due to fall that occurred earlier this morning. Patient is currently not taking blood thinners. She denies LOC. She complains of soreness to sight of impact on head and ribs. She denies hip pain. Patient lost balance, uses a walker, hit head, only complains of sore top of head where she has swelling and tenderness. Denies neck, back pain or other injury. Denies focal neuro symptoms. No change in speech, vision, swallow, understanding, no lateralizing weak/numb.   Past Medical History  Diagnosis Date  . High blood pressure   . Arthritis   . Depression   . Anxiety   . Hemorrhoids   . Hyperlipidemia   . Bipolar 1 disorder    Past Surgical History  Procedure Laterality Date  . Back surgery  03/16/87  . Cholecystectomy  08/15/93  . Partial hysterectomy  04/04/95  . Bladder tacked  04/04/95  . Ankle surgery  04/28/97    right ankle fracture  . Wrist surgery  02/24/09    right wrist fracture  . Colonoscopy  10/26/2002    Dr. Karilyn Cotaehman- small external hemorrhoids otherwise normal   . Breast biopsy  05/16/95    right side  . Savory dilation  10/31/2011    Procedure: SAVORY DILATION;  Surgeon: Corbin Adeobert M Rourk, MD;  Location: [facility] ENDO SUITE;  Service: Endoscopy;  Laterality: N/A;  Elease Hashimoto. Maloney dilation  10/31/2011    Procedure: Elease HashimotoMALONEY DILATION;  Surgeon: Corbin Adeobert M Rourk, MD;  Location: [facility] ENDO SUITE;  Service: Endoscopy;  Laterality: N/A;  . Abdominal  hysterectomy     Family History  Problem Relation Age of Onset  . Arthritis    . Asthma    . Diabetes    . Colon cancer Neg Hx   . Anxiety disorder Paternal Aunt   . Anxiety disorder Paternal Uncle   . Bipolar disorder Cousin    History  Substance Use Topics  . Smoking status: Never Smoker   . Smokeless tobacco: Never Used  . Alcohol Use: No   OB History    Gravida Para Term Preterm AB TAB SAB Ectopic Multiple Living            0     Review of Systems  10 Systems reviewed and are negative for acute change except as noted in the HPI.  Allergies  Benzodiazepines; Ciprofloxacin; Cephalexin; and Penicillins  Home Medications   Prior to Admission medications   Medication Sig Start Date End Date Taking? Authorizing Provider  acetaminophen (TYLENOL) 500 MG tablet Take 1,000 mg by mouth every 6 (six) hours as needed for pain.   Yes Historical Provider, MD  aspirin EC 81 MG tablet Take 81 mg by mouth every morning.    Yes Historical Provider, MD  benztropine (COGENTIN) 0.5 MG tablet Take 1 tablet (0.5 mg total) by mouth 2 (two) times daily. 03/09/14 03/09/15 Yes Diannia Rudereborah Ross, MD  beta carotene  w/minerals (OCUVITE) tablet Take 1 tablet by mouth every morning.    Yes Historical Provider, MD  Biotin 5000 MCG CAPS Take 1 capsule by mouth every morning.   Yes Historical Provider, MD  buPROPion (WELLBUTRIN XL) 300 MG 24 hr tablet Take 1 tablet (300 mg total) by mouth every morning. 03/09/14 03/09/15 Yes Diannia Ruder, MD  calcium carbonate (OS-CAL) 600 MG TABS Take 1,200 mg by mouth daily with breakfast.    Yes Historical Provider, MD  Cholecalciferol (VITAMIN D) 2000 UNITS CAPS Take 1 capsule by mouth every morning.    Yes Historical Provider, MD  clonazePAM (KLONOPIN) 0.5 MG tablet Take 1 tablet (0.5 mg total) by mouth 2 (two) times daily as needed for anxiety. 03/09/14  Yes Diannia Ruder, MD  docusate sodium (STOOL SOFTENER) 100 MG capsule Take 200 mg by mouth at bedtime.   Yes  Historical Provider, MD  furosemide (LASIX) 20 MG tablet Take 20 mg by mouth every Monday, Wednesday, and Friday.  01/26/14  Yes Historical Provider, MD  lamoTRIgine (LAMICTAL) 100 MG tablet Take 1 tablet (100 mg total) by mouth 2 (two) times daily. 03/09/14  Yes Diannia Ruder, MD  loratadine (CLARITIN) 10 MG tablet Take 10 mg by mouth daily as needed for allergies.   Yes Historical Provider, MD  losartan (COZAAR) 100 MG tablet Take 100 mg by mouth every morning.  10/04/11  Yes Historical Provider, MD  multivitamin (PROSIGHT) TABS tablet Take 1 tablet by mouth daily.   Yes Historical Provider, MD  mupirocin ointment (BACTROBAN) 2 % Place 1 application into the nose 3 (three) times daily as needed (wound care).   Yes Historical Provider, MD  omeprazole (PRILOSEC) 40 MG capsule Take 40 mg by mouth daily.   Yes Historical Provider, MD  oxybutynin (DITROPAN-XL) 5 MG 24 hr tablet Take 1 tablet by mouth daily. 02/28/14  Yes Historical Provider, MD  PARoxetine (PAXIL) 40 MG tablet Take 1 tablet (40 mg total) by mouth at bedtime. 03/09/14 03/09/15 Yes Diannia Ruder, MD  polyethylene glycol [facility] Ethelene Hal) packet Take 17 g by mouth daily as needed for mild constipation.   Yes Historical Provider, MD  risperiDONE (RISPERDAL) 0.25 MG tablet Take 1 tablet (0.25 mg total) by mouth 3 (three) times daily. Takes at 8:00AM, 2:00PM, 5:00PM. 03/09/14  Yes Diannia Ruder, MD  risperiDONE (RISPERDAL) 1 MG tablet Take 1 tablet (1 mg total) by mouth at bedtime. 03/09/14  Yes Diannia Ruder, MD  solifenacin (VESICARE) 10 MG tablet Take 10 mg by mouth every morning.    Yes Historical Provider, MD  terazosin (HYTRIN) 2 MG capsule Take 2 mg by mouth at bedtime.    Yes Historical Provider, MD  vitamin B-12 (CYANOCOBALAMIN) 1000 MCG tablet Take 1,000 mcg by mouth daily.   Yes Historical Provider, MD  vitamin C (ASCORBIC ACID) 500 MG tablet Take 1,000 mg by mouth daily.    Yes Historical Provider, MD   Triage Vitals: BP 146/83   Pulse 76  Temp(Src) 97.9 F (36.6 C) (Oral)  Resp 18  Ht 5\' 6"  (1.676 m)  Wt 230 lb (104.327 kg)  BMI 37.14 kg/m2  SpO2 96%  Physical Exam  Constitutional:  Awake, alert, nontoxic appearance with baseline speech for patient.  HENT:  Mouth/Throat: No oropharyngeal exudate.  Vertex scalp hematoma tender  Eyes: EOM are normal. Pupils are equal, round, and reactive to light. Right eye exhibits no discharge. Left eye exhibits no discharge.  Neck: Neck supple.  C-S NT  Cardiovascular: Normal rate  and regular rhythm.   No murmur heard. Pulmonary/Chest: Effort normal and breath sounds normal. No stridor. No respiratory distress. She has no wheezes. She has no rales. She exhibits tenderness.  Mild left lower ribcage tenderness  Abdominal: Soft. Bowel sounds are normal. She exhibits no mass. There is no tenderness. There is no rebound.  Musculoskeletal: She exhibits no tenderness.  Baseline ROM, moves extremities with no obvious new focal weakness.  Lymphadenopathy:    She has no cervical adenopathy.  Neurological: She is alert.  GCS 15. Awake, alert, cooperative and aware of situation; motor strength 5/5 bilaterally; sensation normal to light touch bilaterally; peripheral visual fields full to confrontation; no facial asymmetry; tongue midline; major cranial nerves appear intact; no pronator drift, normal finger to nose bilaterally  Skin: No rash noted.  Psychiatric: She has a normal mood and affect.  Nursing note and vitals reviewed.  ED Course  Procedures (including critical care time)  DIAGNOSTIC STUDIES: Oxygen Saturation is 96% on RA, normal by my interpretation.    COORDINATION OF CARE: 10:39 AM- Discussed plans to order diagnostic CT of head, and EKG. Patient / Family / Caregiver understand and agree with initial ED impression and plan with expectations set for ED visit.  Pt seen and agrees to transfer since no CT at [facility]. The patient appears reasonably stabilized for transfer  considering the current resources, flow, and capabilities available in the ED at this time, and I doubt any other [facility] further screening and/or treatment in the ED prior to transfer. 1150  Labs Review Labs Reviewed - No data to display  Imaging Review Dg Chest 2 View  03/12/2014   CLINICAL DATA:  Headache.  Chest pain.  EXAM: CHEST  2 VIEW  COMPARISON:  07/18/2013.  FINDINGS: Mediastinum and hilar structures are normal . Prominent bilateral first rib costochondral calcification. No change. Mild developing infiltrate left lower lobe cannot be excluded. No pleural effusion or pneumothorax. Cardiomegaly, no pulmonary venous congestion. No acute bony abnormality.  IMPRESSION: Very mild developing infiltrate left lower lobe cannot be excluded.   Electronically Signed   By: Maisie Fushomas  Register   On: 03/12/2014 11:45   Ct Head Wo Contrast  03/12/2014   CLINICAL DATA:  Fall with head injury.  Initial encounter.  EXAM: CT HEAD WITHOUT CONTRAST  TECHNIQUE: Contiguous axial images were obtained from the base of the skull through the vertex without intravenous contrast.  COMPARISON:  11/17/2013  FINDINGS: Mild scalp soft tissue swelling at the right anterior vertex likely is related to the acute fall. No evidence of underlying skull fracture. The brain demonstrates no evidence of hemorrhage, infarction, edema, mass effect, extra-axial fluid collection, hydrocephalus or mass lesion. Stable cortical atrophy and moderately advanced small vessel ischemic changes in the periventricular white matter.  IMPRESSION: Scalp soft tissue swelling without evidence of fracture or acute intracranial abnormality.   Electronically Signed   By: Irish LackGlenn  Yamagata M.D.   On: 03/12/2014 13:36     EKG Interpretation   Date/Time:  Saturday March 12 2014 10:20:30 EDT Ventricular Rate:  76 PR Interval:  190 QRS Duration: 89 QT Interval:  393 QTC Calculation: 442 R Axis:   19 Text Interpretation:  Sinus rhythm Normal ECG No  significant change since  last tracing Confirmed by [facility]  MD, Jonny RuizJOHN ([id number]) on 03/12/2014 10:41:51  AM     MDM    Final diagnoses:  Scalp hematoma, initial encounter  Blunt head trauma, initial encounter  Fall  Blunt trauma to chest,  initial encounter  Chest pain      I personally performed the services described in this documentation, which was scribed in my presence. The recorded information has been reviewed and is accurate.   Amy Horn, MD 03/13/14 2212

## 2014-03-12 NOTE — ED Notes (Signed)
[facility] Patient c/o headache and left side pain. Per staff patient fell in room this morning while walking to dresser. Patient reports hitting head. Denies any LOC.

## 2014-03-31 DIAGNOSIS — Z6841 Body Mass Index (BMI) 40.0 and over, adult: Secondary | ICD-10-CM | POA: Diagnosis not present

## 2014-03-31 DIAGNOSIS — E669 Obesity, unspecified: Secondary | ICD-10-CM | POA: Diagnosis not present

## 2014-03-31 DIAGNOSIS — R635 Abnormal weight gain: Secondary | ICD-10-CM | POA: Diagnosis not present

## 2014-04-06 ENCOUNTER — Ambulatory Visit (INDEPENDENT_AMBULATORY_CARE_PROVIDER_SITE_OTHER): Payer: Medicare Other | Admitting: Psychiatry

## 2014-04-06 ENCOUNTER — Encounter (HOSPITAL_COMMUNITY): Payer: Self-pay | Admitting: Psychiatry

## 2014-04-06 DIAGNOSIS — F333 Major depressive disorder, recurrent, severe with psychotic symptoms: Secondary | ICD-10-CM | POA: Diagnosis not present

## 2014-04-06 DIAGNOSIS — F5105 Insomnia due to other mental disorder: Secondary | ICD-10-CM | POA: Diagnosis not present

## 2014-04-06 DIAGNOSIS — F331 Major depressive disorder, recurrent, moderate: Secondary | ICD-10-CM

## 2014-04-06 DIAGNOSIS — F419 Anxiety disorder, unspecified: Secondary | ICD-10-CM

## 2014-04-06 MED ORDER — BENZTROPINE MESYLATE 0.5 MG PO TABS: 0.5000 mg | ORAL_TABLET | Freq: Two times a day (BID) | ORAL | Status: DC

## 2014-04-06 MED ORDER — RISPERIDONE 1 MG PO TABS: 1.0000 mg | ORAL_TABLET | Freq: Every day | ORAL | Status: DC

## 2014-04-06 MED ORDER — CLONAZEPAM 0.5 MG PO TABS: 0.5000 mg | ORAL_TABLET | Freq: Two times a day (BID) | ORAL | Status: DC | PRN

## 2014-04-06 MED ORDER — LAMOTRIGINE 100 MG PO TABS: 100.0000 mg | ORAL_TABLET | Freq: Two times a day (BID) | ORAL | Status: DC

## 2014-04-06 MED ORDER — BUPROPION HCL ER (XL) 300 MG PO TB24: 300.0000 mg | ORAL_TABLET | ORAL | Status: DC

## 2014-04-06 MED ORDER — PAROXETINE HCL 40 MG PO TABS: 40.0000 mg | ORAL_TABLET | Freq: Every day | ORAL | Status: DC

## 2014-04-06 NOTE — Progress Notes (Signed)
Patient ID: Amy Clay, female   DOB: 1941/03/06, 73 y.o.   MRN: [id number] Patient ID: Amy Clay, female   DOB: 1941/03/06, 73 y.o.   MRN: [id number] Patient ID: Amy Clay, female   DOB: 1941/03/06, 73 y.o.   MRN: [id number] Patient ID: Amy Clay, female   DOB: 1941/03/06, 73 y.o.   MRN: [id number] Patient ID: Amy Clay, female   DOB: 1941/03/06, 73 y.o.   MRN: [id number] Patient ID: Amy Clay, female   DOB: 1941/03/06, 73 y.o.   MRN: [id number] Patient ID: Amy Clay, female   DOB: 1941/03/06, 73 y.o.   MRN: [id number] Patient ID: Amy Clay, female   DOB: 1941/03/06, 73 y.o.   MRN: [id number] Patient ID: Amy Clay, female   DOB: 1941/03/06, 73 y.o.   MRN: [id number] [facility] [id number] Progress Note Amy Clay MRN: [id number] DOB: 1941/03/06 Age: 73 y.o.  Date: 04/06/2014  Chief Complaint  Patient presents with  . Depression  . Anxiety  . Follow-up   History of presenting illness Patient is 73 year old Caucasian female who came with her sister for her followup appointment. She [facility] in the [facility]. She and her sister had been living together in the family farm outside [place]. Neither one has ever been married or have had children. The patient worked until the early 80s when she began to have difficulties with her nerves.  Currently the patient is still struggling with her mental and physical health. She was admitted last year to a psychiatric hospital in [place] because she was anxious nervous and depressed. Last time Dr. Lolly MustacheArfeen increased her Risperdal which is helped somewhat. She can't walk and it's not clear why much of it seems to be anxiety based and she seems to have a fear of falling. She's also recently developed possible renal failure and is can be referred to a nephrologist. She was on lithium for a number of years. We do not have her  recent records from her primary care  physician who is outside of [place]  The patient returns after 4 weeks. She is here with her aide Rosey Batheresa and her sister. Last time I increased her Wellbutrin and lowered her Paxil. She seems to be doing better. She had 2 falls in October and now she is in a wheelchair. She does need physical therapy and I've written an order to have her primary doctor called to order this. Her mood seems a bit brighter and she is smiling more today but she is frustrated with her inability to walk  Past psychiatric history Patient is seeing psychiatrist since 1997 in this office.  Her [facility] psychiatric admission [facility] at [facility] after having suicidal thoughts.  She denies any history of suicidal attempt however endorse history of hopeless feeling.  She is diagnosed with major depressive disorder. She she was very stable on lithium until her creatinine went up and lithium was discontinued.  We had recently tried Lamictal and the dose has been increased recently.  In the past we had tried BuSpar which was discontinued at hospital.  She has another psychiatric inpatient treatment in 90s. She admitted history of mania, depression and psychosis.  Social history Patient lives in a nursing home.  She has no children.  Her husband died in nursing home.  Patient has no other family.  Medical history Patient was recently admitted due to dehydration and UTI.  Patient has history of arthritis blood  pressure and chronic pain. Her primary care physician is Dr[facility] Phillips [facility]olding at [facility].  She takes medication for her blood pressure and arthritis.  She had history of jerking movements, fall and generalized pain. Her last blood work was done on 02/14/2012 which shows anemia, her creatinine was 1.33.  She was given antibiotic result UTI.  Her WBC count was 10.4.  Family History family history includes Anxiety disorder in her paternal aunt and paternal uncle; Arthritis in an other family member; Asthma in  an other family member; Bipolar disorder in her cousin; Diabetes in an other family member. There is no history of Colon cancer.  Review of Systems  Constitutional: Positive for malaise/fatigue.  Skin: Negative.   Neurological: Positive for tremors, weakness and headaches.  Psychiatric/Behavioral: Positive for memory loss. Negative for suicidal ideas, hallucinations and substance abuse. The patient is nervous/anxious. The patient does not have insomnia.      Mental status examination Patient is casually dressed and fairly groomed. She is in a wheelchair   She described her mood aok Her voice is high-pitched and soft.  .Her affect is less blunted and she is smiling more today  Her attention and concentration are poor today  She expresses herself well .  She doesn't have auditory or visual hallucination.  There were no paranoia or delusion obsession present at this time.  Her fund of knowledge is okay.  There are no tremors and shakes in her hand.  She's alert and oriented x3. Her insight judgment are poor and impulse control is okay.  Lab Results:  Results for orders placed or performed during the hospital encounter of 02/14/14 (from the past 8736 hour(s))  Urinalysis, Routine w reflex microscopic   Collection Time: 02/14/14  8:22 PM  Result Value Ref Range   Color, Urine STRAW (A) YELLOW   APPearance CLEAR CLEAR   Specific Gravity, Urine <1.005 (L) 1.005 - 1.030   pH 5.5 5.0 - 8.0   Glucose, UA NEGATIVE NEGATIVE mg/dL   Hgb urine dipstick NEGATIVE NEGATIVE   Bilirubin Urine NEGATIVE NEGATIVE   Ketones, ur NEGATIVE NEGATIVE mg/dL   Protein, ur NEGATIVE NEGATIVE mg/dL   Urobilinogen, UA 0.2 0.0 - 1.0 mg/dL   Nitrite NEGATIVE NEGATIVE   Leukocytes, UA NEGATIVE NEGATIVE  Results for orders placed or performed during the hospital encounter of 11/29/13 (from the past 8736 hour(s))  Basic metabolic panel   Collection Time: 11/29/13 12:36 PM  Result Value Ref Range   Sodium 140 137 - 147  mEq/L   Potassium 4.7 3.7 - 5.3 mEq/L   Chloride 104 96 - 112 mEq/L   CO2 28 19 - 32 mEq/L   Glucose, Bld 94 70 - 99 mg/dL   BUN 32 (H) 6 - 23 mg/dL   Creatinine, Ser 4.091.34 (H) 0.50 - 1.10 mg/dL   Calcium 9.9 8.4 - 81.110.5 mg/dL   GFR calc non Af Amer 38 (L) >90 mL/min   GFR calc Af Amer 44 (L) >90 mL/min   Anion gap 8 5 - 15  Protime-INR   Collection Time: 11/29/13 12:36 PM  Result Value Ref Range   Prothrombin Time 13.2 11.6 - 15.2 seconds   INR 1.00 0.00 - 1.49  Results for orders placed or performed during the hospital encounter of 07/18/13 (from the past 8736 hour(s))  CBC   Collection Time: 07/18/13  8:49 AM  Result Value Ref Range   WBC 7.0 4.0 - 10.5 K/uL   RBC 3.89 3.87 - 5.11  MIL/uL   Hemoglobin 11.2 (L) 12.0 - 15.0 g/dL   HCT 91.4 78.2 - 95.6 %   MCV 94.1 78.0 - 100.0 fL   MCH 28.8 26.0 - 34.0 pg   MCHC 30.6 30.0 - 36.0 g/dL   RDW 21.3 08.6 - 57.8 %   Platelets 230 150 - 400 K/uL  Comprehensive metabolic panel   Collection Time: 07/18/13  8:49 AM  Result Value Ref Range   Sodium 141 137 - 147 mEq/L   Potassium 4.5 3.7 - 5.3 mEq/L   Chloride 104 96 - 112 mEq/L   CO2 27 19 - 32 mEq/L   Glucose, Bld 126 (H) 70 - 99 mg/dL   BUN 37 (H) 6 - 23 mg/dL   Creatinine, Ser 4.69 (H) 0.50 - 1.10 mg/dL   Calcium 62.9 8.4 - 52.8 mg/dL   Total Protein 7.2 6.0 - 8.3 g/dL   Albumin 3.3 (L) 3.5 - 5.2 g/dL   AST 71 (H) 0 - 37 U/L   ALT 71 (H) 0 - 35 U/L   Alkaline Phosphatase 127 (H) 39 - 117 U/L   Total Bilirubin 0.2 (L) 0.3 - 1.2 mg/dL   GFR calc non Af Amer 30 (L) >90 mL/min   GFR calc Af Amer 35 (L) >90 mL/min  Troponin I   Collection Time: 07/18/13  8:49 AM  Result Value Ref Range   Troponin I <0.30 <0.30 ng/mL  CBG monitoring, ED   Collection Time: 07/18/13  8:58 AM  Result Value Ref Range   Glucose-Capillary 118 (H) 70 - 99 mg/dL  Urine culture   Collection Time: 07/18/13  9:12 AM  Result Value Ref Range   Specimen Description URINE, CATHETERIZED    Special  Requests NONE    Culture  Setup Time      07/18/2013 23:14 Performed at [facility] Count      >=100,000 COLONIES/ML Performed at [facility]   Culture      VIRIDANS STREPTOCOCCUS Performed at [facility]   Report Status 07/19/2013 FINAL   Urinalysis, Routine w reflex microscopic   Collection Time: 07/18/13  9:12 AM  Result Value Ref Range   Color, Urine YELLOW YELLOW   APPearance CLOUDY (A) CLEAR   Specific Gravity, Urine 1.010 1.005 - 1.030   pH 6.5 5.0 - 8.0   Glucose, UA NEGATIVE NEGATIVE mg/dL   Hgb urine dipstick NEGATIVE NEGATIVE   Bilirubin Urine NEGATIVE NEGATIVE   Ketones, ur NEGATIVE NEGATIVE mg/dL   Protein, ur NEGATIVE NEGATIVE mg/dL   Urobilinogen, UA 0.2 0.0 - 1.0 mg/dL   Nitrite NEGATIVE NEGATIVE   Leukocytes, UA NEGATIVE NEGATIVE   she had labs done last week with her primary care physician we will get the result Diagnoses 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 We'll continue all medications  Discussed the risk and benefits of the medication in detail.    Time spent 15 minutes.  More than 50% of the time spent and psychoeducation, counseling and coordination of care. She'll return in 3 months  MEDICATIONS this encounter: Meds ordered this encounter  Medications  . risperiDONE (RISPERDAL) 1 MG tablet    Sig: Take 1 tablet (1 mg total) by mouth at bedtime.    Dispense:  30  tablet    Refill:  2  . lamoTRIgine (LAMICTAL) 100 MG tablet    Sig: Take 1 tablet (100 mg total) by mouth 2 (two) times daily.    Dispense:  60 tablet    Refill:  2  . benztropine (COGENTIN) 0.5 MG tablet    Sig: Take 1 tablet (0.5 mg total) by mouth 2 (two) times daily.    Dispense:  60 tablet     Refill:  2  . PARoxetine (PAXIL) 40 MG tablet    Sig: Take 1 tablet (40 mg total) by mouth at bedtime.    Dispense:  30 tablet    Refill:  2  . buPROPion (WELLBUTRIN XL) 300 MG 24 hr tablet    Sig: Take 1 tablet (300 mg total) by mouth every morning.    Dispense:  30 tablet    Refill:  2  . clonazePAM (KLONOPIN) 0.5 MG tablet    Sig: Take 1 tablet (0.5 mg total) by mouth 2 (two) times daily as needed for anxiety.    Dispense:  60 tablet    Refill:  2    Medical Decision Making Problem Points:  Established problem, stable/improving (1), Established problem, worsening (2), New problem, with additional work-up planned (4), Review of last therapy session (1) and Review of psycho-social stressors (1) Data Points:  Review of medication regiment & side effects (2) Review of new medications or change in dosage (2)  I certify that outpatient services furnished can reasonably be expected to improve the patient's condition.   Diannia Ruder, MD

## 2014-04-20 ENCOUNTER — Encounter (HOSPITAL_COMMUNITY): Payer: Self-pay

## 2014-04-20 ENCOUNTER — Emergency Department (HOSPITAL_COMMUNITY)
Admission: EM | Admit: 2014-04-20 | Discharge: 2014-04-20 | Disposition: A | Discharge status: Home / Self Care | Payer: Medicare Other | Attending: Emergency Medicine | Admitting: Emergency Medicine

## 2014-04-20 ENCOUNTER — Emergency Department (HOSPITAL_COMMUNITY): Payer: Medicare Other

## 2014-04-20 DIAGNOSIS — Z7982 Long term (current) use of aspirin: Secondary | ICD-10-CM | POA: Diagnosis not present

## 2014-04-20 DIAGNOSIS — R5381 Other malaise: Secondary | ICD-10-CM | POA: Diagnosis not present

## 2014-04-20 DIAGNOSIS — Z7401 Bed confinement status: Secondary | ICD-10-CM | POA: Diagnosis not present

## 2014-04-20 DIAGNOSIS — R410 Disorientation, unspecified: Secondary | ICD-10-CM | POA: Diagnosis not present

## 2014-04-20 DIAGNOSIS — M1990 Unspecified osteoarthritis, unspecified site: Secondary | ICD-10-CM | POA: Insufficient documentation

## 2014-04-20 DIAGNOSIS — Z792 Long term (current) use of antibiotics: Secondary | ICD-10-CM | POA: Diagnosis not present

## 2014-04-20 DIAGNOSIS — R4182 Altered mental status, unspecified: Secondary | ICD-10-CM | POA: Diagnosis present

## 2014-04-20 DIAGNOSIS — N390 Urinary tract infection, site not specified: Secondary | ICD-10-CM | POA: Diagnosis not present

## 2014-04-20 DIAGNOSIS — R404 Transient alteration of awareness: Secondary | ICD-10-CM | POA: Diagnosis not present

## 2014-04-20 DIAGNOSIS — I10 Essential (primary) hypertension: Secondary | ICD-10-CM

## 2014-04-20 DIAGNOSIS — F0390 Unspecified dementia without behavioral disturbance: Secondary | ICD-10-CM | POA: Diagnosis not present

## 2014-04-20 DIAGNOSIS — R400 Somnolence: Secondary | ICD-10-CM | POA: Diagnosis not present

## 2014-04-20 DIAGNOSIS — N179 Acute kidney failure, unspecified: Secondary | ICD-10-CM | POA: Diagnosis present

## 2014-04-20 DIAGNOSIS — E785 Hyperlipidemia, unspecified: Secondary | ICD-10-CM | POA: Insufficient documentation

## 2014-04-20 DIAGNOSIS — Z79899 Other long term (current) drug therapy: Secondary | ICD-10-CM | POA: Diagnosis not present

## 2014-04-20 DIAGNOSIS — Z880 Allergy status to penicillin: Secondary | ICD-10-CM | POA: Diagnosis not present

## 2014-04-20 DIAGNOSIS — N189 Chronic kidney disease, unspecified: Secondary | ICD-10-CM

## 2014-04-20 DIAGNOSIS — R918 Other nonspecific abnormal finding of lung field: Secondary | ICD-10-CM | POA: Diagnosis not present

## 2014-04-20 DIAGNOSIS — J811 Chronic pulmonary edema: Secondary | ICD-10-CM | POA: Diagnosis not present

## 2014-04-20 DIAGNOSIS — W19XXXA Unspecified fall, initial encounter: Secondary | ICD-10-CM | POA: Diagnosis present

## 2014-04-20 LAB — CBC WITH DIFFERENTIAL/PLATELET
Basophils Absolute: 0 10*3/uL (ref 0.0–0.1)
Basophils Relative: 0 % (ref 0–1)
EOS PCT: 5 % (ref 0–5)
Eosinophils Absolute: 0.3 10*3/uL (ref 0.0–0.7)
HCT: 30.5 % — ABNORMAL LOW (ref 36.0–46.0)
HEMOGLOBIN: 9.5 g/dL — AB (ref 12.0–15.0)
Lymphocytes Relative: 21 % (ref 12–46)
Lymphs Abs: 1.1 10*3/uL (ref 0.7–4.0)
MCH: 29.4 pg (ref 26.0–34.0)
MCHC: 31.1 g/dL (ref 30.0–36.0)
MCV: 94.4 fL (ref 78.0–100.0)
MONOS PCT: 8 % (ref 3–12)
Monocytes Absolute: 0.4 10*3/uL (ref 0.1–1.0)
NEUTROS ABS: 3.5 10*3/uL (ref 1.7–7.7)
Neutrophils Relative %: 66 % (ref 43–77)
Platelets: 198 10*3/uL (ref 150–400)
RBC: 3.23 MIL/uL — ABNORMAL LOW (ref 3.87–5.11)
RDW: 16.9 % — ABNORMAL HIGH (ref 11.5–15.5)
WBC: 5.3 10*3/uL (ref 4.0–10.5)

## 2014-04-20 LAB — BASIC METABOLIC PANEL
Anion gap: 9 (ref 5–15)
BUN: 37 mg/dL — AB (ref 6–23)
CALCIUM: 10.1 mg/dL (ref 8.4–10.5)
CHLORIDE: 104 meq/L (ref 96–112)
CO2: 29 mEq/L (ref 19–32)
CREATININE: 1.74 mg/dL — AB (ref 0.50–1.10)
GFR calc Af Amer: 32 mL/min — ABNORMAL LOW (ref >90)
GFR calc non Af Amer: 28 mL/min — ABNORMAL LOW (ref >90)
Glucose, Bld: 112 mg/dL — ABNORMAL HIGH (ref 70–99)
Potassium: 5.1 mEq/L (ref 3.7–5.3)
Sodium: 142 mEq/L (ref 137–147)

## 2014-04-20 LAB — URINALYSIS, ROUTINE W REFLEX MICROSCOPIC
BILIRUBIN URINE: NEGATIVE
Glucose, UA: NEGATIVE mg/dL
HGB URINE DIPSTICK: NEGATIVE
KETONES UR: NEGATIVE mg/dL
Leukocytes, UA: NEGATIVE
Nitrite: NEGATIVE
PH: 5.5 (ref 5.0–8.0)
Protein, ur: NEGATIVE mg/dL
Specific Gravity, Urine: 1.02 (ref 1.005–1.030)
Urobilinogen, UA: 0.2 mg/dL (ref 0.0–1.0)

## 2014-04-20 LAB — LACTIC ACID, PLASMA: Lactic Acid, Venous: 1.2 mmol/L (ref 0.5–2.2)

## 2014-04-20 LAB — TROPONIN I: Troponin I: <0.3 ng/mL (ref <0.30)

## 2014-04-20 MED ORDER — SODIUM CHLORIDE 0.9 % IV BOLUS (SEPSIS)
250.0000 mL | Freq: Once | INTRAVENOUS | Status: AC
  Administered 2014-04-20: 250 mL via INTRAVENOUS

## 2014-04-20 MED ORDER — SODIUM CHLORIDE 0.9 % IV SOLN: INTRAVENOUS | Status: DC

## 2014-04-20 NOTE — Consult Note (Signed)
[facility] History and Physical  Amy Clay:[id number] DOB: 1940-09-26 DOA: 04/20/2014  Referring physician: Dr Clarene Duke - [facility] PCP: Colette Ribas, MD   Chief Complaint: confusion and difficulty urinating  HPI: Amy Clay is a 73 y.o. female  Sister called by the NH for concern about confusion. Per report pt baseline AAO to person and only intermittenly to time. Pt was reported to put fork into hand instead of plate. No injury to hand. NH non-specific about what confusion state was noted. No complaints at this time. Mental state is at baseline at time of ED evaluation per pts sister who is regularly w/ the pt.    Review of Systems:  Constitutional:  Weight gain over past several months HEENT:  No headaches, Difficulty swallowing,Tooth/dental problems,Sore throat,  No sneezing, itching, ear ache, nasal congestion, post nasal drip,  Cardio-vascular:  No chest pain, Orthopnea, PND, swelling in lower extremities, anasarca, dizziness, palpitations  GI:  No heartburn, indigestion, abdominal pain, nausea, vomiting, diarrhea, change in bowel habits, loss of appetite  Resp:  No shortness of breath with exertion or at rest. No excess mucus, no productive cough, No non-productive cough, No coughing up of blood.No change in color of mucus.No wheezing.No chest wall deformity  Skin:  no rash or lesions.  GU:  no dysuria, change in color of urine, no urgency or frequency. No flank pain.  Musculoskeletal:  No joint pain or swelling. No decreased range of motion. No back pain.  Psych:  Per hpi  Past Medical History  Diagnosis Date  . High blood pressure   . Arthritis   . Depression   . Anxiety   . Hemorrhoids   . Hyperlipidemia   . Bipolar 1 disorder    Past Surgical History  Procedure Laterality Date  . Back surgery  03/16/87  . Cholecystectomy  08/15/93  . Partial hysterectomy  04/04/95  . Bladder tacked  04/04/95  . Ankle surgery  04/28/97    right  ankle fracture  . Wrist surgery  02/24/09    right wrist fracture  . Colonoscopy  10/26/2002    Dr. Karilyn Cota- small external hemorrhoids otherwise normal   . Breast biopsy  05/16/95    right side  . Savory dilation  10/31/2011    Procedure: SAVORY DILATION;  Surgeon: Corbin Ade, MD;  Location: [facility];  Service: Endoscopy;  Laterality: N/A;  Elease Hashimoto dilation  10/31/2011    Procedure: Elease Hashimoto DILATION;  Surgeon: Corbin Ade, MD;  Location: [facility];  Service: Endoscopy;  Laterality: N/A;  . Abdominal hysterectomy     Social History:  reports that she has never smoked. She has never used smokeless tobacco. She reports that she does not drink alcohol or use illicit drugs.  Allergies  Allergen Reactions  . Benzodiazepines Other (See Comments)    Memory problems on Ativan, which goes away when she is off of it.   . Ciprofloxacin     Rash at site of injection with itching  . Cephalexin Rash and Other (See Comments)    Blistering of the mouth  . Penicillins Rash    Family History  Problem Relation Age of Onset  . Arthritis    . Asthma    . Diabetes    . Colon cancer Neg Hx   . Anxiety disorder Paternal Aunt   . Anxiety disorder Paternal Uncle   . Bipolar disorder Cousin      Prior to Admission medications  Medication Sig Start Date End Date Taking? Authorizing Provider  acetaminophen (TYLENOL) 500 MG tablet Take 1,000 mg by mouth every 6 (six) hours as needed for pain.   Yes Historical Provider, MD  aspirin EC 81 MG tablet Take 81 mg by mouth every morning.    Yes Historical Provider, MD  benztropine (COGENTIN) 0.5 MG tablet Take 1 tablet (0.5 mg total) by mouth 2 (two) times daily. 04/06/14 04/06/15 Yes Diannia Rudereborah Ross, MD  beta carotene w/minerals (OCUVITE) tablet Take 1 tablet by mouth every morning.    Yes Historical Provider, MD  Biotin 5000 MCG CAPS Take 1 capsule by mouth every morning.   Yes Historical Provider, MD  buPROPion (WELLBUTRIN XL) 300 MG 24 hr tablet  Take 1 tablet (300 mg total) by mouth every morning. 04/06/14 04/06/15 Yes Diannia Rudereborah Ross, MD  calcium carbonate (OS-CAL) 600 MG TABS Take 1,200 mg by mouth daily with breakfast.    Yes Historical Provider, MD  Cholecalciferol (VITAMIN D) 2000 UNITS CAPS Take 1 capsule by mouth every morning.    Yes Historical Provider, MD  clonazePAM (KLONOPIN) 0.5 MG tablet Take 1 tablet (0.5 mg total) by mouth 2 (two) times daily as needed for anxiety. 04/06/14  Yes Diannia Rudereborah Ross, MD  docusate sodium (STOOL SOFTENER) 100 MG capsule Take 200 mg by mouth at bedtime.   Yes Historical Provider, MD  furosemide (LASIX) 20 MG tablet Take 20 mg by mouth every Monday, Wednesday, and Friday.  01/26/14  Yes Historical Provider, MD  lamoTRIgine (LAMICTAL) 100 MG tablet Take 1 tablet (100 mg total) by mouth 2 (two) times daily. 04/06/14  Yes Diannia Rudereborah Ross, MD  loratadine (CLARITIN) 10 MG tablet Take 10 mg by mouth daily as needed for allergies.   Yes Historical Provider, MD  losartan (COZAAR) 100 MG tablet Take 100 mg by mouth every morning.  10/04/11  Yes Historical Provider, MD  multivitamin (PROSIGHT) TABS tablet Take 1 tablet by mouth daily.   Yes Historical Provider, MD  mupirocin ointment (BACTROBAN) 2 % Place 1 application into the nose 3 (three) times daily as needed (wound care).   Yes Historical Provider, MD  omeprazole (PRILOSEC) 40 MG capsule Take 40 mg by mouth daily.   Yes Historical Provider, MD  PARoxetine (PAXIL) 40 MG tablet Take 1 tablet (40 mg total) by mouth at bedtime. 04/06/14 04/06/15 Yes Diannia Rudereborah Ross, MD  polyethylene glycol [facility](MIRALAX / Ethelene HalGLYCOLAX) packet Take 17 g by mouth daily as needed for mild constipation.   Yes Historical Provider, MD  risperiDONE (RISPERDAL) 0.25 MG tablet Take 1 tablet (0.25 mg total) by mouth 3 (three) times daily. Takes at 8:00AM, 2:00PM, 5:00PM. 03/09/14  Yes Diannia Rudereborah Ross, MD  risperiDONE (RISPERDAL) 1 MG tablet Take 1 tablet (1 mg total) by mouth at bedtime. 04/06/14  Yes Diannia Rudereborah  Ross, MD  solifenacin (VESICARE) 10 MG tablet Take 10 mg by mouth every morning.    Yes Historical Provider, MD  terazosin (HYTRIN) 2 MG capsule Take 2 mg by mouth at bedtime.    Yes Historical Provider, MD  vitamin B-12 (CYANOCOBALAMIN) 1000 MCG tablet Take 1,000 mcg by mouth daily.   Yes Historical Provider, MD  vitamin C (ASCORBIC ACID) 500 MG tablet Take 1,000 mg by mouth daily.    Yes Historical Provider, MD   Physical Exam: Filed Vitals:   04/20/14 1530 04/20/14 1600 04/20/14 1630 04/20/14 1700  BP: 118/67 124/72 123/63 112/70  Pulse: 55 60    Resp: 10     SpO2: 100% 100%  Wt Readings from Last 3 Encounters:  03/12/14 104.327 kg (230 lb)  02/14/14 104.327 kg (230 lb)  01/07/14 103.42 kg (228 lb)    General: Appears calm and comfortable Eyes:  PERRL, normal lids, irises & conjunctiva ENT: dry mm Neck:  no LAD, masses or thyromegaly Cardiovascular:  RRR, no m/r/g. 1+ pitting edema Telemetry:  SR, no arrhythmias  Respiratory:  CTA bilaterally, no w/r/r. Normal respiratory effort. Abdomen:  soft, ntnd Skin:  no rash or induration seen on limited exam Musculoskeletal:  grossly normal tone BUE/BLE Psychiatric: AAO to person, date, and place. Aware of suroundings and family in room. Answers questions appropriately Neurologic:  grossly non-focal.          Labs on Admission:  Basic Metabolic Panel:  Recent Labs Lab 04/20/14 1428  NA 142  K 5.1  CL 104  CO2 29  GLUCOSE 112*  BUN 37*  CREATININE 1.74*  CALCIUM 10.1   Liver Function Tests: No results for input(s): AST, ALT, ALKPHOS, BILITOT, PROT, ALBUMIN in the last 168 hours. No results for input(s): LIPASE, AMYLASE in the last 168 hours. No results for input(s): AMMONIA in the last 168 hours. CBC:  Recent Labs Lab 04/20/14 1428  WBC 5.3  NEUTROABS 3.5  HGB 9.5*  HCT 30.5*  MCV 94.4  PLT 198   Cardiac Enzymes:  Recent Labs Lab 04/20/14 1428  TROPONINI <0.30    BNP (last 3 results) No results  for input(s): PROBNP in the last 8760 hours. CBG: No results for input(s): GLUCAP in the last 168 hours.  Radiological Exams on Admission: Dg Chest 1 View  04/20/2014   CLINICAL DATA:  73 year old female with altered mental status.  EXAM: CHEST - 1 VIEW  COMPARISON:  Chest x-ray 03/12/2014.  FINDINGS: Lung volumes are low. Film is underpenetrated, limiting the diagnostic sensitivity and specificity of this examination. With these limitations in mind, bibasilar opacities are favored to predominantly be technique related, and secondary to underlying atelectasis. Airspace consolidation the lung bases is not excluded, but is not strongly favored. No definite pleural effusions. Crowding of the pulmonary vasculature, accentuated by low lung volumes, without frank pulmonary edema. Heart size is mildly enlarged. The patient is rotated to the right on today's exam, resulting in distortion of the mediastinal contours and reduced diagnostic sensitivity and specificity for mediastinal pathology.  IMPRESSION: 1. Low lung volumes with cardiomegaly and pulmonary venous congestion, but no frank pulmonary edema.   Electronically Signed   By: Trudie Reedaniel  Entrikin M.D.   On: 04/20/2014 15:14    EKG: Independently reviewed. NSR, low voltage. No sign of ACS  Assessment/Plan Active Problems:   Fall   Acute on chronic renal failure   Dementia   Benign essential HTN   Dementia: mental status at baseline. Suspect typical stepwise decline in mental status. No sign of intracranial process or infection.   Physical deconditioning: getting weaker at NH per pts sister.  - [facility] at [facility] facility  Acute on Chronic kidney disease: Cr. 1.7 baseline 1.5. Sister endorses difficulty getting pt to take in adequate fluids daily.  - encouraged to discuss w/ NH staff.  - 25oml NS bolus given in ED  HTN: stable in ED. No change  Family Communication: SIster present for consultation Disposition Plan: back  to SNF    MERRELL, DAVID J [facility] [email or web address] Password [email or web address]

## 2014-04-20 NOTE — ED Provider Notes (Signed)
CSN: [id number]     Arrival date & time 04/20/14  1410 History   First MD Initiated Contact with Patient 04/20/14 1421     Chief Complaint  Patient presents with  . Altered Mental Status  . Urinary Retention      HPI Pt was seen at 1435. Per EMS, NH report and pt: c/o gradual onset and persistence of constant "confusion" since yesterday. NH also feels pt is having difficulty voiding. Pt A&O on arrival to the ED, denies any complaints. Denies CP/palpitations, no SOB/cough, no abd pain, no N/V/D, no back pain, no focal motor weakness, no reported recent falls, no fevers.    Past Medical History  Diagnosis Date  . High blood pressure   . Arthritis   . Depression   . Anxiety   . Hemorrhoids   . Hyperlipidemia   . Bipolar 1 disorder    Past Surgical History  Procedure Laterality Date  . Back surgery  03/16/87  . Cholecystectomy  08/15/93  . Partial hysterectomy  04/04/95  . Bladder tacked  04/04/95  . Ankle surgery  04/28/97    right ankle fracture  . Wrist surgery  02/24/09    right wrist fracture  . Colonoscopy  10/26/2002    Dr. Karilyn Cota- small external hemorrhoids otherwise normal   . Breast biopsy  05/16/95    right side  . Savory dilation  10/31/2011    Procedure: SAVORY DILATION;  Surgeon: Corbin Ade, MD;  Location: [facility];  Service: Endoscopy;  Laterality: N/A;  Elease Hashimoto dilation  10/31/2011    Procedure: Elease Hashimoto DILATION;  Surgeon: Corbin Ade, MD;  Location: [facility];  Service: Endoscopy;  Laterality: N/A;  . Abdominal hysterectomy     Family History  Problem Relation Age of Onset  . Arthritis    . Asthma    . Diabetes    . Colon cancer Neg Hx   . Anxiety disorder Paternal Aunt   . Anxiety disorder Paternal Uncle   . Bipolar disorder Cousin    History  Substance Use Topics  . Smoking status: Never Smoker   . Smokeless tobacco: Never Used  . Alcohol Use: No    Review of Systems ROS: Statement: All systems negative except as marked or noted  in the HPI; Constitutional: Negative for fever and chills. ; ; Eyes: Negative for eye pain, redness and discharge. ; ; ENMT: Negative for ear pain, hoarseness, nasal congestion, sinus pressure and sore throat. ; ; Cardiovascular: Negative for chest pain, palpitations, diaphoresis, dyspnea and peripheral edema. ; ; Respiratory: Negative for cough, wheezing and stridor. ; ; Gastrointestinal: Negative for nausea, vomiting, diarrhea, abdominal pain, blood in stool, hematemesis, jaundice and rectal bleeding. . ; ; Genitourinary: Negative for dysuria, flank pain and hematuria. ; ; Musculoskeletal: Negative for back pain and neck pain. Negative for swelling and trauma.; ; Skin: Negative for pruritus, rash, abrasions, blisters, bruising and skin lesion.; ; Neuro: +"confusion." Negative for headache, lightheadedness and neck stiffness. Negative for weakness, altered level of consciousness , altered mental status, extremity weakness, paresthesias, involuntary movement, seizure and syncope.      Allergies  Benzodiazepines; Ciprofloxacin; Cephalexin; and Penicillins  Home Medications   Prior to Admission medications   Medication Sig Start Date End Date Taking? Authorizing Provider  acetaminophen (TYLENOL) 500 MG tablet Take 1,000 mg by mouth every 6 (six) hours as needed for pain.   Yes Historical Provider, MD  aspirin EC 81 MG tablet Take 81 mg by  mouth every morning.    Yes Historical Provider, MD  benztropine (COGENTIN) 0.5 MG tablet Take 1 tablet (0.5 mg total) by mouth 2 (two) times daily. 04/06/14 04/06/15 Yes Diannia Rudereborah Ross, MD  beta carotene w/minerals (OCUVITE) tablet Take 1 tablet by mouth every morning.    Yes Historical Provider, MD  Biotin 5000 MCG CAPS Take 1 capsule by mouth every morning.   Yes Historical Provider, MD  buPROPion (WELLBUTRIN XL) 300 MG 24 hr tablet Take 1 tablet (300 mg total) by mouth every morning. 04/06/14 04/06/15 Yes Diannia Rudereborah Ross, MD  calcium carbonate (OS-CAL) 600 MG TABS  Take 1,200 mg by mouth daily with breakfast.    Yes Historical Provider, MD  Cholecalciferol (VITAMIN D) 2000 UNITS CAPS Take 1 capsule by mouth every morning.    Yes Historical Provider, MD  clonazePAM (KLONOPIN) 0.5 MG tablet Take 1 tablet (0.5 mg total) by mouth 2 (two) times daily as needed for anxiety. 04/06/14  Yes Diannia Rudereborah Ross, MD  docusate sodium (STOOL SOFTENER) 100 MG capsule Take 200 mg by mouth at bedtime.   Yes Historical Provider, MD  furosemide (LASIX) 20 MG tablet Take 20 mg by mouth every Monday, Wednesday, and Friday.  01/26/14  Yes Historical Provider, MD  lamoTRIgine (LAMICTAL) 100 MG tablet Take 1 tablet (100 mg total) by mouth 2 (two) times daily. 04/06/14  Yes Diannia Rudereborah Ross, MD  loratadine (CLARITIN) 10 MG tablet Take 10 mg by mouth daily as needed for allergies.   Yes Historical Provider, MD  losartan (COZAAR) 100 MG tablet Take 100 mg by mouth every morning.  10/04/11  Yes Historical Provider, MD  multivitamin (PROSIGHT) TABS tablet Take 1 tablet by mouth daily.   Yes Historical Provider, MD  mupirocin ointment (BACTROBAN) 2 % Place 1 application into the nose 3 (three) times daily as needed (wound care).   Yes Historical Provider, MD  omeprazole (PRILOSEC) 40 MG capsule Take 40 mg by mouth daily.   Yes Historical Provider, MD  PARoxetine (PAXIL) 40 MG tablet Take 1 tablet (40 mg total) by mouth at bedtime. 04/06/14 04/06/15 Yes Diannia Rudereborah Ross, MD  polyethylene glycol [facility](MIRALAX / Ethelene HalGLYCOLAX) packet Take 17 g by mouth daily as needed for mild constipation.   Yes Historical Provider, MD  risperiDONE (RISPERDAL) 0.25 MG tablet Take 1 tablet (0.25 mg total) by mouth 3 (three) times daily. Takes at 8:00AM, 2:00PM, 5:00PM. 03/09/14  Yes Diannia Rudereborah Ross, MD  risperiDONE (RISPERDAL) 1 MG tablet Take 1 tablet (1 mg total) by mouth at bedtime. 04/06/14  Yes Diannia Rudereborah Ross, MD  solifenacin (VESICARE) 10 MG tablet Take 10 mg by mouth every morning.    Yes Historical Provider, MD  terazosin (HYTRIN) 2  MG capsule Take 2 mg by mouth at bedtime.    Yes Historical Provider, MD  vitamin B-12 (CYANOCOBALAMIN) 1000 MCG tablet Take 1,000 mcg by mouth daily.   Yes Historical Provider, MD  vitamin C (ASCORBIC ACID) 500 MG tablet Take 1,000 mg by mouth daily.    Yes Historical Provider, MD   BP 112/70 mmHg  Pulse 60  Resp 10  SpO2 100% Physical Exam  1440; Physical examination:  Nursing notes reviewed; Vital signs and O2 SAT reviewed;  Constitutional: Well developed, Well nourished, Well hydrated, In no acute distress; Head:  Normocephalic, atraumatic; Eyes: EOMI, PERRL, No scleral icterus; ENMT: Mouth and pharynx normal, Mucous membranes moist; Neck: Supple, Full range of motion, No lymphadenopathy; Cardiovascular: Regular rate and rhythm, No gallop; Respiratory: Breath sounds clear & equal bilaterally,  No wheezes.  Speaking full sentences with ease, Normal respiratory effort/excursion; Chest: Nontender, Movement normal; Abdomen: Soft, Nontender, Nondistended, Normal bowel sounds; Genitourinary: No CVA tenderness; Extremities: Pulses normal, No tenderness, No edema, No calf edema or asymmetry.; Neuro: AA&Ox3, +HOH, otherwise major CN grossly intact. No facial droop. Speech clear. Grips equal. Pt moves all extremities spontaneously and to command without apparent gross focal motor deficits.; Skin: Color normal, Warm, Dry.   ED Course  Procedures     EKG Interpretation   Date/Time:  Wednesday April 20 2014 15:19:32 EST Ventricular Rate:  59 PR Interval:  48 QRS Duration: 90 QT Interval:  428 QTC Calculation: 424 R Axis:   23 Text Interpretation:  Sinus rhythm Low voltage, precordial leads Artifact  When compared with ECG of 03/12/2014 Artifact is now Present Otherwise no  significant change Confirmed by [facility] Centers[facility]CCMANUS  MD, Nicholos JohnsKATHLEEN [phone]([id number]) on  04/20/2014 3:53:22 PM      MDM  MDM Reviewed: previous chart, nursing note and vitals Reviewed previous: labs and ECG Interpretation: labs, ECG and  x-ray   Results for orders placed or performed during the hospital encounter of 04/20/14  CBC with Differential  Result Value Ref Range   WBC 5.3 4.0 - 10.5 K/uL   RBC 3.23 (L) 3.87 - 5.11 MIL/uL   Hemoglobin 9.5 (L) 12.0 - 15.0 g/dL   HCT 84.630.5 (L) 96.236.0 - 95.246.0 %   MCV 94.4 78.0 - 100.0 fL   MCH 29.4 26.0 - 34.0 pg   MCHC 31.1 30.0 - 36.0 g/dL   RDW 84.116.9 (H) 32.411.5 - 40.115.5 %   Platelets 198 150 - 400 K/uL   Neutrophils Relative % 66 43 - 77 %   Neutro Abs 3.5 1.7 - 7.7 K/uL   Lymphocytes Relative 21 12 - 46 %   Lymphs Abs 1.1 0.7 - 4.0 K/uL   Monocytes Relative 8 3 - 12 %   Monocytes Absolute 0.4 0.1 - 1.0 K/uL   Eosinophils Relative 5 0 - 5 %   Eosinophils Absolute 0.3 0.0 - 0.7 K/uL   Basophils Relative 0 0 - 1 %   Basophils Absolute 0.0 0.0 - 0.1 K/uL  Basic metabolic panel  Result Value Ref Range   Sodium 142 137 - 147 mEq/L   Potassium 5.1 3.7 - 5.3 mEq/L   Chloride 104 96 - 112 mEq/L   CO2 29 19 - 32 mEq/L   Glucose, Bld 112 (H) 70 - 99 mg/dL   BUN 37 (H) 6 - 23 mg/dL   Creatinine, Ser 0.271.74 (H) 0.50 - 1.10 mg/dL   Calcium 25.310.1 8.4 - 66.410.5 mg/dL   GFR calc non Af Amer 28 (L) >90 mL/min   GFR calc Af Amer 32 (L) >90 mL/min   Anion gap 9 5 - 15  Urinalysis, Routine w reflex microscopic  Result Value Ref Range   Color, Urine YELLOW YELLOW   APPearance CLEAR CLEAR   Specific Gravity, Urine 1.020 1.005 - 1.030   pH 5.5 5.0 - 8.0   Glucose, UA NEGATIVE NEGATIVE mg/dL   Hgb urine dipstick NEGATIVE NEGATIVE   Bilirubin Urine NEGATIVE NEGATIVE   Ketones, ur NEGATIVE NEGATIVE mg/dL   Protein, ur NEGATIVE NEGATIVE mg/dL   Urobilinogen, UA 0.2 0.0 - 1.0 mg/dL   Nitrite NEGATIVE NEGATIVE   Leukocytes, UA NEGATIVE NEGATIVE  Lactic acid, plasma  Result Value Ref Range   Lactic Acid, Venous 1.2 0.5 - 2.2 mmol/L  Troponin I  Result Value Ref Range   Troponin  I <0.30 <0.30 ng/mL   Dg Chest 1 View 04/20/2014   CLINICAL DATA:  73 year old female with altered mental status.   EXAM: CHEST - 1 VIEW  COMPARISON:  Chest x-ray 03/12/2014.  FINDINGS: Lung volumes are low. Film is underpenetrated, limiting the diagnostic sensitivity and specificity of this examination. With these limitations in mind, bibasilar opacities are favored to predominantly be technique related, and secondary to underlying atelectasis. Airspace consolidation the lung bases is not excluded, but is not strongly favored. No definite pleural effusions. Crowding of the pulmonary vasculature, accentuated by low lung volumes, without frank pulmonary edema. Heart size is mildly enlarged. The patient is rotated to the right on today's exam, resulting in distortion of the mediastinal contours and reduced diagnostic sensitivity and specificity for mediastinal pathology.  IMPRESSION: 1. Low lung volumes with cardiomegaly and pulmonary venous congestion, but no frank pulmonary edema.   Electronically Signed   By: Trudie Reed M.D.   On: 04/20/2014 15:14    1710:  BUN/Cr mildly elevated from baseline; will dose judicious IVF. Pt's sister is now at the bedside and states pt has been acting per her baseline. Pt does not stand per her baseline "without falling" per her sister. Pt has been conversive with ED staff, resps easy, NAD. Workup otherwise reassuring, and without clear indication for admission at this time.  T/C to [facility] Dr. Konrad Dolores, case discussed, including:  HPI, pertinent PM/SHx, VS/PE, dx testing, ED course and treatment:  Agreeable to consult in the ED.   1750:  [facility] Dr. Konrad Dolores has evaluated pt in the ED: agrees no clear criteria to admit pt at this time, can be d/c back to NH. Pt and her family are agreeable with this plan.    Samuel Jester, DO 04/24/14 1400

## 2014-04-20 NOTE — ED Notes (Signed)
EMS reports pt resident of [facility] and reports has had increased confusion and difficulty voiding since yesterday.  Pt alert, denies any pain.

## 2014-04-20 NOTE — ED Notes (Signed)
[facility] called and notified of pt's return.

## 2014-04-20 NOTE — Discharge Instructions (Signed)
°Emergency Department Resource Guide °1) Find a Doctor and Pay Out of Pocket °Although you won't have to find out who is covered by your insurance plan, it is a good idea to ask around and get recommendations. You will then need to call the office and see if the doctor you have chosen will accept you as a new patient and what types of options they offer for patients who are self-pay. Some doctors offer discounts or will set up payment plans for their patients who do not have insurance, but you will need to ask so you aren't surprised when you get to your appointment. ° °2) Contact Your Local Health Department °Not all health departments have doctors that can see patients for sick visits, but many do, so it is worth a call to see if yours does. If you don't know where your local health department is, you can check in your phone book. The CDC also has a tool to help you locate your state's health department, and many state websites also have listings of all of their local health departments. ° °3) Find a Walk-in Clinic °If your illness is not likely to be very severe or complicated, you may want to try a walk in clinic. These are popping up all over the country in pharmacies, drugstores, and shopping centers. They're usually staffed by nurse practitioners or physician assistants that have been trained to treat common illnesses and complaints. They're usually fairly quick and inexpensive. However, if you have serious medical issues or chronic medical problems, these are probably not your best option. ° °No Primary Care Doctor: °- Call [facility] at  832-8000 - they can help you locate a primary care doctor that  accepts your insurance, provides certain services, etc. °- Physician Referral Service- 1-800-533-3463 ° °Chronic Pain Problems: °Organization         Address  Phone   Notes  °[facility]  (336) 297-2271 Patients need to be referred by their primary care doctor.  ° °Medication  Assistance: °Organization         Address  Phone   Notes  °[place] [facility] [address] °[place], [place] [address] (336) 641-8030 --Must be a resident of [place] °-- Must have NO insurance coverage whatsoever (no Medicaid/ Medicare, etc.) °-- The pt. MUST have a primary care doctor that directs their care regularly and follows them in the community °  °[facility]  (866) 331-1348   °[employer]  (888) 892-1162   ° °Agencies that provide inexpensive medical care: °Organization         Address  Phone   Notes  °[facility]  (336) 832-8035   °[facility]    (336) 832-7272   °[facility] [address] °[place], [place] [address] (336) 832-4777   °[facility] [address], °[place] (336) 271-4999   °[facility]    (336) 373-0678   °[facility]    (336) 272-1050   °[facility] ° [address], [place] Phone:  (336) 832-4444, Fax:  (336) 832-4440 Hours of Operation:  9 am - 6 pm, M-F.  Also accepts Medicaid/Medicare and self-pay.  °[facility] ° [address], [place] Phone: (336) 832-3150, Fax: (336) 832-3151. Hours of Operation:  8:30 am - 5:30 pm, M-F.  Also accepts Medicaid and self-pay.  °[facility] [place] [address], [place] Phone: (336) 878-6027   °[facility] [address], [place], [place] (336)723-1848, Ext. 123 Mondays & Thursdays: 7-9 AM.  First 15 patients are seen on a first come, first serve basis. °  ° °Medicaid-accepting [place] Providers: ° °Organization         Address  Phone   Notes  °[facility] [address], [place] (336) 641-2100 Also accepts self-pay patients.  °[facility] [address], [place] ° (336) 856-9996   °[facility] [address], [place]  (336) 288-8857   °[facility] [address] [place] [address], [place] (336) 299-7000   °Veita Bland [address], [place]  ° (336) 373-1557 Only accepts [place] Access Medicaid patients after they have their name applied to their card.  ° °Self-Pay (no insurance) in [place]: ° °Organization         Address  Phone   Notes  °Sickle Cell Patients, [facility] [address], [place] (336) 832-1970   °[facility] [address], [place] (336) 832-4400   °[facility] [place] ° [address], [place] (336) 992-4800   °[facility]/Dr. Osei-Bonsu ° [address] [place] [address], [place] or [address], [place] (336) 841-8500 Phone number for both [place] and [place] locations is the same.  °[facility] [address], [place] (336) 299-0000   °[facility] [address] [place] [address], [place] or [address] (336) 852-7530 °(336) 878-2260   °[facility] [address], [place] (336) 350-1642, phone; (336) 294-5005, fax Sees patients 1st and 3rd Saturday of every month.  Must not qualify for public or private insurance (i.e. Medicaid, Medicare, [place] Health Choice, Veterans' Benefits) • Household income should be no more than 200% of the poverty level •The clinic cannot treat you if you are pregnant or think you are pregnant • Sexually transmitted diseases are not treated at the clinic.  ° ° °Dental Care: °Organization         Address  Phone  Notes  °[place] [facility] [address], [place] (336) 641-6152 Accepts children up to age 21 who are enrolled in Medicaid or [place] Health Choice; pregnant women with a Medicaid card; and children who have applied for Medicaid or [place] Health Choice, but were declined, whose parents can pay a reduced fee at time of service.  °[place]  [facility] [place]  [address], [place] (336) 641-7733 Accepts children up to age 21 who are enrolled in Medicaid or [place] Health Choice; pregnant women with a Medicaid card; and children who have applied for Medicaid or  Health Choice, but were declined, whose parents can pay a reduced fee at time of service.  °[facility] ° [address],  (336) 641-4533 Patients are seen by appointment only. Walk-ins are not accepted. [facility] will see patients 18 years of age and older. °Monday - Tuesday (8am-5pm) °Most Wednesdays (8:30-5pm) °$30 per visit, cash only  °[facility] ° [address], [place] (336) 641-4533 Patients are seen by appointment only. Walk-ins are not accepted. [facility] will see patients 18 years of age and older. °One   Wednesday Evening (Monthly: Volunteer Based).  $30 per visit, cash only  °[facility]  (919) 537-3737 for adults; Children under age 4, call Graduate Pediatric Dentistry at ([phone]) [phone]. Children aged 4-14, please call (919) [phone] to request a pediatric application. ° Dental services are provided in all areas of dental care including fillings, crowns and bridges, complete and partial dentures, implants, gum treatment, root canals, and extractions. Preventive care is also provided. Treatment is provided to both adults and children. °Patients are selected via a lottery and there is often a waiting list. °  °[facility] [address], °[place] ° (336) 763-8833 [email or web address] °  °[facility] [address], [place], [place] (336)723-1848, Ext. 123 Second and Fourth Thursday of each month, opens at 6:30 AM; Clinic ends at 9 AM.  Patients are seen on a first-come first-served basis, and a limited number are seen during each clinic.  ° °[facility] ° [address], [place], [place] (336) 723-7904    Eligibility Requirements °You must have lived in [place], [place], or [place] counties for at least the last three months. °  You cannot be eligible for state or federal sponsored healthcare insurance, including Veterans Administration, Medicaid, or Medicare. °  You generally cannot be eligible for healthcare insurance through your employer.  °  How to apply: °Eligibility screenings are held every Tuesday and Wednesday afternoon from 1:00 pm until 4:00 pm. You do not need an appointment for the interview!  °[facility] [address], [place], [place] 336-631-2330   °[facility]  336-342-8273   °[facility]  336-703-3100   °[facility]  336-570-6415   ° °Behavioral Health Resources in the Community: °Intensive Outpatient Programs °Organization         Address  Phone  Notes  °[facility] [address], [place], [place] 336-878-6098   °[facility] [address], [place], [place] 336-832-9800   °[facility] [address], [place], [place] ° 336-882-2125   °[facility] [address],  °[place], [place] 1-800-853-5163 or 336-641-4981   °Substance Abuse Resources °Organization         Address  Phone  Notes  °[facility]  336-882-2125   °[facility]  336-784-9470   °[facility]  336-285-9073   °[facility]  336-845-3988   °Residential & Outpatient Substance Abuse Program  1-800-659-3381   °Psychological Services °Organization         Address  Phone  Notes  °[facility]  336- 832-9600   °[facility]  336- 378-7881   °[facility] [address], [place] 1-800-853-5163 or 336-641-4981   ° °Mobile Crisis Teams °Organization         Address  Phone  Notes  °[facility], Mobile Crisis Care Unit  1-877-626-1772   °Assertive °[facility] ° [address]  [place], [place] 336-834-9664   °Sharon DeEsch [address] °[place] 336-554-5454   ° °Self-Help/Support Groups °Organization         Address  Phone             Notes  °[facility] [place] - variety of support groups  336- 373-1402 Call for more information  °Narcotics Anonymous (NA), [facility] [address], °[place]  2 meetings at this location  ° °  Residential Treatment Programs °Organization         Address  Phone  Notes  °[facility] [address],    °[place]  1-866-801-8205   °[facility] ° [address], [place], [place] 704-293-8524   °[facility] [address], [place] 336-845-3988 Admissions: 8am-3pm M-F  °[facility] [address]    °[place], [place] 336-841-1104   °[facility] [address], [place], [place] 336-379-7146   °[facility] [address]  °[place], [place] 336-285-9073   °[facility] Intensive Outpatient [address], [place], [place] 336-852-3033   °[facility] [address]  °[place], [place] 1-877-615-2722 or 336-784-9470   °[facility] [address] [place], [place] 336-227-7417 Accepts Medicaid  °[facility] [address]  ° [place] 1-800-659-3381 Substance Abuse/Addiction Treatment  ° °[place] Behavioral Health Resources °Organization         Address  Phone  Notes  °[facility]  (888) 581-9988   °Julie Brannon, PhD [address] [place], [place]   (336) 349-5553 or (336) 951-0000   °[facility]   [address] °[place], [place] (336) 349-4454   °[facility] [address], [place], [place] (336) 342-8316 Insurance/Medicaid/sponsorship through [facility]  °[facility] [address]                                    [place], [place] (336) 342-8316 Therapy/tele-psych/case    °[facility] [address]  ° [place], [place] (336) 349-2233    °Dr. Arfeen  (336) 349-4544   °[facility] [place]  [employer] [place] [facility] 1) [address],  °2) [address], [place] °3)  [address], [place] (336) 349-3220 °(336) 342-7768 ° °(336) 342-8140   °[place] [facility] (336) 342-1394 or (336) 342-3537 (After Hours)    ° ° ° °Take your usual prescriptions as previously directed.  Call your regular medical doctor tomorrow to schedule a follow up appointment within the next 2 days. Return to the Emergency Department immediately sooner if worsening.  ° °

## 2014-04-21 LAB — URINE CULTURE
CULTURE: NO GROWTH
Colony Count: NO GROWTH

## 2014-04-28 ENCOUNTER — Emergency Department (HOSPITAL_COMMUNITY): Payer: Medicare Other

## 2014-04-28 ENCOUNTER — Encounter (HOSPITAL_COMMUNITY): Payer: Self-pay | Admitting: *Deleted

## 2014-04-28 ENCOUNTER — Emergency Department (HOSPITAL_COMMUNITY)
Admission: EM | Admit: 2014-04-28 | Discharge: 2014-04-28 | Disposition: A | Discharge status: Home / Self Care | Payer: Medicare Other | Attending: Emergency Medicine | Admitting: Emergency Medicine

## 2014-04-28 DIAGNOSIS — N390 Urinary tract infection, site not specified: Secondary | ICD-10-CM

## 2014-04-28 DIAGNOSIS — E785 Hyperlipidemia, unspecified: Secondary | ICD-10-CM | POA: Diagnosis not present

## 2014-04-28 DIAGNOSIS — Y998 Other external cause status: Secondary | ICD-10-CM | POA: Insufficient documentation

## 2014-04-28 DIAGNOSIS — S3081AA Abrasion of flank, initial encounter: Secondary | ICD-10-CM

## 2014-04-28 DIAGNOSIS — S60512A Abrasion of left hand, initial encounter: Secondary | ICD-10-CM

## 2014-04-28 DIAGNOSIS — Y929 Unspecified place or not applicable: Secondary | ICD-10-CM | POA: Diagnosis not present

## 2014-04-28 DIAGNOSIS — M545 Low back pain: Secondary | ICD-10-CM | POA: Diagnosis not present

## 2014-04-28 DIAGNOSIS — M1990 Unspecified osteoarthritis, unspecified site: Secondary | ICD-10-CM | POA: Insufficient documentation

## 2014-04-28 DIAGNOSIS — Y9389 Activity, other specified: Secondary | ICD-10-CM | POA: Diagnosis not present

## 2014-04-28 DIAGNOSIS — F319 Bipolar disorder, unspecified: Secondary | ICD-10-CM | POA: Insufficient documentation

## 2014-04-28 DIAGNOSIS — Z743 Need for continuous supervision: Secondary | ICD-10-CM | POA: Diagnosis not present

## 2014-04-28 DIAGNOSIS — M25552 Pain in left hip: Secondary | ICD-10-CM | POA: Diagnosis not present

## 2014-04-28 DIAGNOSIS — Z23 Encounter for immunization: Secondary | ICD-10-CM | POA: Insufficient documentation

## 2014-04-28 DIAGNOSIS — S39012A Strain of muscle, fascia and tendon of lower back, initial encounter: Secondary | ICD-10-CM

## 2014-04-28 DIAGNOSIS — S239XXA Sprain of unspecified parts of thorax, initial encounter: Secondary | ICD-10-CM | POA: Insufficient documentation

## 2014-04-28 DIAGNOSIS — IMO0002 Reserved for concepts with insufficient information to code with codable children: Secondary | ICD-10-CM

## 2014-04-28 DIAGNOSIS — Z79899 Other long term (current) drug therapy: Secondary | ICD-10-CM | POA: Insufficient documentation

## 2014-04-28 DIAGNOSIS — S30811A Abrasion of abdominal wall, initial encounter: Secondary | ICD-10-CM | POA: Diagnosis not present

## 2014-04-28 DIAGNOSIS — F419 Anxiety disorder, unspecified: Secondary | ICD-10-CM | POA: Diagnosis not present

## 2014-04-28 DIAGNOSIS — S29012A Strain of muscle and tendon of back wall of thorax, initial encounter: Secondary | ICD-10-CM

## 2014-04-28 DIAGNOSIS — R279 Unspecified lack of coordination: Secondary | ICD-10-CM | POA: Diagnosis not present

## 2014-04-28 DIAGNOSIS — Z792 Long term (current) use of antibiotics: Secondary | ICD-10-CM | POA: Diagnosis not present

## 2014-04-28 DIAGNOSIS — S233XXA Sprain of ligaments of thoracic spine, initial encounter: Secondary | ICD-10-CM

## 2014-04-28 DIAGNOSIS — S299XXA Unspecified injury of thorax, initial encounter: Secondary | ICD-10-CM | POA: Diagnosis not present

## 2014-04-28 DIAGNOSIS — Z880 Allergy status to penicillin: Secondary | ICD-10-CM | POA: Insufficient documentation

## 2014-04-28 DIAGNOSIS — R52 Pain, unspecified: Secondary | ICD-10-CM

## 2014-04-28 DIAGNOSIS — S3992XA Unspecified injury of lower back, initial encounter: Secondary | ICD-10-CM | POA: Diagnosis not present

## 2014-04-28 DIAGNOSIS — W19XXXA Unspecified fall, initial encounter: Secondary | ICD-10-CM

## 2014-04-28 LAB — URINALYSIS, ROUTINE W REFLEX MICROSCOPIC
Bilirubin Urine: NEGATIVE
GLUCOSE, UA: NEGATIVE mg/dL
Ketones, ur: NEGATIVE mg/dL
Nitrite: POSITIVE — AB
PH: 6.5 (ref 5.0–8.0)
Protein, ur: NEGATIVE mg/dL
Specific Gravity, Urine: 1.015 (ref 1.005–1.030)
Urobilinogen, UA: 0.2 mg/dL (ref 0.0–1.0)

## 2014-04-28 LAB — BASIC METABOLIC PANEL
Anion gap: 9 (ref 5–15)
BUN: 39 mg/dL — ABNORMAL HIGH (ref 6–23)
CO2: 28 meq/L (ref 19–32)
Calcium: 9.9 mg/dL (ref 8.4–10.5)
Chloride: 105 mEq/L (ref 96–112)
Creatinine, Ser: 1.6 mg/dL — ABNORMAL HIGH (ref 0.50–1.10)
GFR calc Af Amer: 36 mL/min — ABNORMAL LOW (ref >90)
GFR calc non Af Amer: 31 mL/min — ABNORMAL LOW (ref >90)
Glucose, Bld: 117 mg/dL — ABNORMAL HIGH (ref 70–99)
POTASSIUM: 4.5 meq/L (ref 3.7–5.3)
SODIUM: 142 meq/L (ref 137–147)

## 2014-04-28 LAB — URINE MICROSCOPIC-ADD ON

## 2014-04-28 LAB — CBC WITH DIFFERENTIAL/PLATELET
Basophils Absolute: 0 10*3/uL (ref 0.0–0.1)
Basophils Relative: 0 % (ref 0–1)
Eosinophils Absolute: 0.1 10*3/uL (ref 0.0–0.7)
Eosinophils Relative: 2 % (ref 0–5)
HEMATOCRIT: 31.9 % — AB (ref 36.0–46.0)
Hemoglobin: 9.8 g/dL — ABNORMAL LOW (ref 12.0–15.0)
LYMPHS PCT: 8 % — AB (ref 12–46)
Lymphs Abs: 0.5 10*3/uL — ABNORMAL LOW (ref 0.7–4.0)
MCH: 29.4 pg (ref 26.0–34.0)
MCHC: 30.7 g/dL (ref 30.0–36.0)
MCV: 95.8 fL (ref 78.0–100.0)
MONO ABS: 0.4 10*3/uL (ref 0.1–1.0)
MONOS PCT: 5 % (ref 3–12)
Neutro Abs: 5.5 10*3/uL (ref 1.7–7.7)
Neutrophils Relative %: 85 % — ABNORMAL HIGH (ref 43–77)
Platelets: 181 10*3/uL (ref 150–400)
RBC: 3.33 MIL/uL — ABNORMAL LOW (ref 3.87–5.11)
RDW: 16.8 % — ABNORMAL HIGH (ref 11.5–15.5)
WBC: 6.5 10*3/uL (ref 4.0–10.5)

## 2014-04-28 MED ORDER — NITROFURANTOIN MONOHYD MACRO 100 MG PO CAPS
100.0000 mg | ORAL_CAPSULE | ORAL | Status: AC
  Administered 2014-04-28: 100 mg via ORAL
  Filled 2014-04-28: qty 1

## 2014-04-28 MED ORDER — SULFAMETHOXAZOLE-TRIMETHOPRIM 800-160 MG PO TABS: 1.0000 | ORAL_TABLET | Freq: Two times a day (BID) | ORAL | Status: DC

## 2014-04-28 MED ORDER — TETANUS-DIPHTH-ACELL PERTUSSIS 5-2.5-18.5 LF-MCG/0.5 IM SUSP
0.5000 mL | Freq: Once | INTRAMUSCULAR | Status: AC
  Administered 2014-04-28: 0.5 mL via INTRAMUSCULAR
  Filled 2014-04-28: qty 0.5

## 2014-04-28 NOTE — ED Notes (Signed)
Abrasion to left flank area.

## 2014-04-28 NOTE — ED Notes (Signed)
Pt able to stand with assistance of two nurses and make couple of steps.

## 2014-04-28 NOTE — ED Notes (Signed)
[facility].  Per report, pt fell out of a wheelchair this morning.  Skin tear noted to left hand. Wound dressed and bleeding controlled.  Pt denies pain in any location at this time.

## 2014-04-28 NOTE — ED Notes (Signed)
Bladder drained with 550 ml of cloudy dark yellow urine obtained.

## 2014-04-28 NOTE — ED Notes (Signed)
MD at bedside.  Dr Bebe ShaggyWickline on phone with Nurse from facility.

## 2014-04-28 NOTE — ED Notes (Signed)
Pt's POA called, says pt is not ambulatory without walker and help.  Dr Bebe ShaggyWickline informed and verbal give to just see of pt can stand.

## 2014-04-28 NOTE — ED Provider Notes (Signed)
CSN: [id number]     Arrival date & time 04/28/14  9147 History  This chart was scribed for Amy Gaskins, MD by Gwenyth Ober, ED Scribe. This patient was seen in room APA04/APA04 and the patient's care was started at 7:10 AM.    Chief Complaint  Patient presents with  . Fall   HPI Comments: LEVEL 5 CAVEAT: ALTERED MENTAL STATUS  Patient is a 73 y.o. female presenting with fall. The history is provided by a caregiver. No language interpreter was used.  Fall This is a new problem. The problem has not changed since onset.   LEVEL 5 CAVEAT: ALTERED MENTAL STATUS    HPI Comments: Amy Clay is a 73 y.o. female brought in by EMS who presents to the Emergency Department complaining of lower back pain that started this morning after she fell out of a wheel chair. Per [facility], pt stood up from her wheelchair on her own and fell. She did not hit her head or lose consciousness in the fall. They note that pt has been falling more frequently and has increased confusion recently.  Past Medical History  Diagnosis Date  . High blood pressure   . Arthritis   . Depression   . Anxiety   . Hemorrhoids   . Hyperlipidemia   . Bipolar 1 disorder    Past Surgical History  Procedure Laterality Date  . Back surgery  03/16/87  . Cholecystectomy  08/15/93  . Partial hysterectomy  04/04/95  . Bladder tacked  04/04/95  . Ankle surgery  04/28/97    right ankle fracture  . Wrist surgery  02/24/09    right wrist fracture  . Colonoscopy  10/26/2002    Dr. Karilyn Cota- small external hemorrhoids otherwise normal   . Breast biopsy  05/16/95    right side  . Savory dilation  10/31/2011    Procedure: SAVORY DILATION;  Surgeon: Corbin Ade, MD;  [facility];  Service: Endoscopy;  Laterality: N/A;  Elease Hashimoto dilation  10/31/2011    Procedure: Elease Hashimoto DILATION;  Surgeon: Corbin Ade, MD;  Location: [facility];  Service: Endoscopy;  Laterality: N/A;  . Abdominal hysterectomy     Family  History  Problem Relation Age of Onset  . Arthritis    . Asthma    . Diabetes    . Colon cancer Neg Hx   . Anxiety disorder Paternal Aunt   . Anxiety disorder Paternal Uncle   . Bipolar disorder Cousin    History  Substance Use Topics  . Smoking status: Never Smoker   . Smokeless tobacco: Never Used  . Alcohol Use: No   OB History    Gravida Para Term Preterm AB TAB SAB Ectopic Multiple Living            0     Review of Systems  Unable to perform ROS: Mental status change   Allergies  Benzodiazepines; Ciprofloxacin; Cephalexin; and Penicillins  Home Medications   Prior to Admission medications   Medication Sig Start Date End Date Taking? Authorizing Provider  acetaminophen (TYLENOL) 500 MG tablet Take 1,000 mg by mouth every 6 (six) hours as needed for pain.    Historical Provider, MD  aspirin EC 81 MG tablet Take 81 mg by mouth every morning.     Historical Provider, MD  benztropine (COGENTIN) 0.5 MG tablet Take 1 tablet (0.5 mg total) by mouth 2 (two) times daily. 04/06/14 04/06/15  Diannia Ruder, MD  beta carotene  w/minerals (OCUVITE) tablet Take 1 tablet by mouth every morning.     Historical Provider, MD  Biotin 5000 MCG CAPS Take 1 capsule by mouth every morning.    Historical Provider, MD  buPROPion (WELLBUTRIN XL) 300 MG 24 hr tablet Take 1 tablet (300 mg total) by mouth every morning. 04/06/14 04/06/15  Diannia Ruder, MD  calcium carbonate (OS-CAL) 600 MG TABS Take 1,200 mg by mouth daily with breakfast.     Historical Provider, MD  Cholecalciferol (VITAMIN D) 2000 UNITS CAPS Take 1 capsule by mouth every morning.     Historical Provider, MD  clonazePAM (KLONOPIN) 0.5 MG tablet Take 1 tablet (0.5 mg total) by mouth 2 (two) times daily as needed for anxiety. 04/06/14   Diannia Ruder, MD  docusate sodium (STOOL SOFTENER) 100 MG capsule Take 200 mg by mouth at bedtime.    Historical Provider, MD  furosemide (LASIX) 20 MG tablet Take 20 mg by mouth every Monday, Wednesday,  and Friday.  01/26/14   Historical Provider, MD  lamoTRIgine (LAMICTAL) 100 MG tablet Take 1 tablet (100 mg total) by mouth 2 (two) times daily. 04/06/14   Diannia Ruder, MD  loratadine (CLARITIN) 10 MG tablet Take 10 mg by mouth daily as needed for allergies.    Historical Provider, MD  losartan (COZAAR) 100 MG tablet Take 100 mg by mouth every morning.  10/04/11   Historical Provider, MD  multivitamin (PROSIGHT) TABS tablet Take 1 tablet by mouth daily.    Historical Provider, MD  mupirocin ointment (BACTROBAN) 2 % Place 1 application into the nose 3 (three) times daily as needed (wound care).    Historical Provider, MD  omeprazole (PRILOSEC) 40 MG capsule Take 40 mg by mouth daily.    Historical Provider, MD  PARoxetine (PAXIL) 40 MG tablet Take 1 tablet (40 mg total) by mouth at bedtime. 04/06/14 04/06/15  Diannia Ruder, MD  polyethylene glycol [facility] Ethelene Hal) packet Take 17 g by mouth daily as needed for mild constipation.    Historical Provider, MD  risperiDONE (RISPERDAL) 0.25 MG tablet Take 1 tablet (0.25 mg total) by mouth 3 (three) times daily. Takes at 8:00AM, 2:00PM, 5:00PM. 03/09/14   Diannia Ruder, MD  risperiDONE (RISPERDAL) 1 MG tablet Take 1 tablet (1 mg total) by mouth at bedtime. 04/06/14   Diannia Ruder, MD  solifenacin (VESICARE) 10 MG tablet Take 10 mg by mouth every morning.     Historical Provider, MD  terazosin (HYTRIN) 2 MG capsule Take 2 mg by mouth at bedtime.     Historical Provider, MD  vitamin B-12 (CYANOCOBALAMIN) 1000 MCG tablet Take 1,000 mcg by mouth daily.    Historical Provider, MD  vitamin C (ASCORBIC ACID) 500 MG tablet Take 1,000 mg by mouth daily.     Historical Provider, MD   BP 147/84 mmHg  Pulse 69  Temp(Src) 98.2 F (36.8 C) (Rectal)  Resp 16  SpO2 95% Physical Exam  Nursing note and vitals reviewed.  CONSTITUTIONAL: elderly HEAD: Normocephalic/atraumatic EYES: EOMI/PERRL ENMT: Mucous membranes dry, No evidence of facial/nasal trauma NECK:  supple no meningeal signs SPINE/BACK: Patient maintained in spinal precautions/logroll utilized; diffuse tenderness to T/L spine; no bruising noted CV: S1/S2 noted, no murmurs/rubs/gallops noted LUNGS: Lungs are clear to auscultation bilaterally, no apparent distress ABDOMEN: soft, nontender, no rebound or guarding, bowel sounds noted throughout abdomen GU:no cva tenderness NEURO: Pt is drowsy but arousable, follows commands but appears confused. moves all extremitiesx4.  No facial droop.   EXTREMITIES: pulses normal/equal,  full ROM; skin tear to left hand, All other extremities/joints palpated/ranged and nontender SKIN: warm, color normal; abrasion to left upper flank PSYCH: unable to assess  ED Course  Procedures  DIAGNOSTIC STUDIES: Oxygen Saturation is 95% on RA, normal by my interpretation.    COORDINATION OF CARE: 7:20 AM Discussed treatment plan with pt at bedside and pt agreed to plan. I spoke to supervisor at facility. She reports this patient has been falling more frequently recently, worse over past several weeks.  Today, she got up on her own from chair and fell.  No head injury is reported.  No LOC is reported. - CT head deferred for now Pt appears drowsy with slight confusion, though supervisor at facility reports likely due to her medications.  This may also be culprit of frequent falls.  Supervisor thinks she has UTI as cause of her falls.   Pt is reports back pain - imaging ordered.  She does not have new neck pain .  She also has tenderness/abrasion to left upper flank near costal margin, CXR ordered 10:00 AM UTI noted Due to multiple medication allergies and renal insufficiency, short course of bactrim ordered at discharge with culture pending Pt is in no distress She can stand/ambulate with assistance without new complaints I feel she is appropriate/safe for discharge back to facility  Labs Review Labs Reviewed  BASIC METABOLIC PANEL - Abnormal; Notable for the  following:    Glucose, Bld 117 (*)    BUN 39 (*)    Creatinine, Ser 1.60 (*)    GFR calc non Af Amer 31 (*)    GFR calc Af Amer 36 (*)    All other components within normal limits  CBC WITH DIFFERENTIAL - Abnormal; Notable for the following:    RBC 3.33 (*)    Hemoglobin 9.8 (*)    HCT 31.9 (*)    RDW 16.8 (*)    Neutrophils Relative % 85 (*)    Lymphocytes Relative 8 (*)    Lymphs Abs 0.5 (*)    All other components within normal limits  URINALYSIS, ROUTINE W REFLEX MICROSCOPIC - Abnormal; Notable for the following:    Hgb urine dipstick TRACE (*)    Nitrite POSITIVE (*)    Leukocytes, UA LARGE (*)    All other components within normal limits  URINE MICROSCOPIC-ADD ON - Abnormal; Notable for the following:    Bacteria, UA MANY (*)    All other components within normal limits  URINE CULTURE    Imaging Review Dg Chest 1 View  04/28/2014   CLINICAL DATA:  Lumbar strain  EXAM: CHEST - 1 VIEW  COMPARISON:  None.  FINDINGS: The heart size and mediastinal contours are within normal limits. Both lungs are clear. The visualized skeletal structures are unremarkable.  IMPRESSION: No active disease.   Electronically Signed   By: Maryclare BeanArt  Hoss M.D.   On: 04/28/2014 08:39   Dg Thoracic Spine 4v  04/28/2014   CLINICAL DATA:  Status post fall 1 day ago with back strain. ; limited mobility  EXAM: THORACIC SPINE - 4+ VIEW  COMPARISON:  Chest x-ray of April 28, 2014  FINDINGS: The thoracic vertebral bodies are preserved in height where visualized. The disc space heights exhibit mild narrowing at multiple levels. There are prominent right lateral anterior bridging osteophytes in the mid and lower thoracic spine. The pedicles are grossly intact. No abnormal paravertebral soft tissue densities are demonstrated.  IMPRESSION: No acute bony abnormality of the thoracic spine  is demonstrated. There are degenerative changes.   Electronically Signed   By: David  SwazilandJordan   On: 04/28/2014 08:50   Dg Lumbar Spine  Complete  04/28/2014   CLINICAL DATA:  Patient fell 1 day prior with lumbar spine pain and strain  EXAM: LUMBAR SPINE - COMPLETE 4+ VIEW  COMPARISON:  July 18, 2013  FINDINGS: Frontal, lateral, spot lumbosacral lateral, and bilateral oblique views were obtained. There are 5 non-rib-bearing lumbar type vertebral bodies. There is upper lumbar dextroscoliosis with a rotatory component with lower lumbar levoscoliosis with a rotatory component. There is no fracture or spondylolisthesis. There is moderate disc narrowing at all levels. Narrowing is more severe at L5-S1 than elsewhere. There is facet osteoarthritic change at all levels,  IMPRESSION: Scoliosis and multifocal arthropathy. No fracture or spondylolisthesis.   Electronically Signed   By: Bretta BangWilliam  Woodruff M.D.   On: 04/28/2014 08:37     EKG Interpretation   Date/Time:  Thursday April 28 2014 07:37:05 EST Ventricular Rate:  68 PR Interval:  223 QRS Duration: 89 QT Interval:  442 QTC Calculation: 470 R Axis:   10 Text Interpretation:  Sinus rhythm Prolonged PR interval Non-specific ST-t  changes No significant change since last tracing Confirmed by Bebe ShaggyWICKLINE   MD, Dorinda HillNALD ([id number]) on 04/28/2014 7:44:46 AM     Medications  Tdap (BOOSTRIX) injection 0.5 mL (0.5 mLs Intramuscular Given 04/28/14 0721)  nitrofurantoin (macrocrystal-monohydrate) (MACROBID) capsule 100 mg (100 mg Oral Given 04/28/14 0904)    MDM   Final diagnoses:  Lumbar strain, initial encounter  Thoracic sprain and strain  Pain  Abrasion of left hand, initial encounter  Thoracic sprain and strain, initial encounter  Fall, initial encounter  Abrasion of flank, initial encounter  UTI (lower urinary tract infection)    Nursing notes including past medical history and social history reviewed and considered in documentation xrays/imaging reviewed by myself and considered during evaluation Labs/vital reviewed myself and considered during evaluation   I personally  performed the services described in this documentation, which was scribed in my presence. The recorded information has been reviewed and is accurate.     Amy Gaskinsonald W Wisdom Rickey, MD 04/28/14 1001

## 2014-04-28 NOTE — ED Notes (Signed)
[facility] called for transport back to [place].

## 2014-04-28 NOTE — ED Notes (Signed)
[facility] Amy BottcherKay Clay gave home telephone number of [phone] and cell number [phone] if needed.

## 2014-04-30 LAB — URINE CULTURE: Colony Count: >=100000

## 2014-05-01 ENCOUNTER — Telehealth (HOSPITAL_COMMUNITY): Payer: Self-pay

## 2014-05-01 NOTE — Telephone Encounter (Signed)
Post ED Visit - Positive Culture Follow-up  Culture report reviewed by antimicrobial stewardship pharmacist: [x]  Wes Dulaney, Pharm.D., BCPS []  Celedonio MiyamotoJeremy Frens, Pharm.D., BCPS []  Georgina PillionElizabeth Martin, Pharm.D., BCPS []  ShellmanMinh Pham, VermontPharm.D., BCPS, AAHIVP []  Estella HuskMichelle Turner, Pharm.D., BCPS, AAHIVP []  Elder CyphersLorie Poole, [address]Pharm.D., BCPS  Positive Urine culture, >/= 100,000 colonies -> E Coli Treated with Sulfa-Trimeth, organism sensitive to the same and no further patient follow-up is required at this time.  Arvid RightClark, Jood Retana Dorn 05/01/2014, 11:35 PM

## 2014-06-02 DIAGNOSIS — Z6839 Body mass index (BMI) 39.0-39.9, adult: Secondary | ICD-10-CM | POA: Diagnosis not present

## 2014-06-02 DIAGNOSIS — S80822D Blister (nonthermal), left lower leg, subsequent encounter: Secondary | ICD-10-CM | POA: Diagnosis not present

## 2014-06-07 DIAGNOSIS — M6281 Muscle weakness (generalized): Secondary | ICD-10-CM | POA: Diagnosis not present

## 2014-06-07 DIAGNOSIS — Z8614 Personal history of Methicillin resistant Staphylococcus aureus infection: Secondary | ICD-10-CM | POA: Diagnosis not present

## 2014-06-07 DIAGNOSIS — I129 Hypertensive chronic kidney disease with stage 1 through stage 4 chronic kidney disease, or unspecified chronic kidney disease: Secondary | ICD-10-CM | POA: Diagnosis not present

## 2014-06-07 DIAGNOSIS — M1990 Unspecified osteoarthritis, unspecified site: Secondary | ICD-10-CM | POA: Diagnosis not present

## 2014-06-07 DIAGNOSIS — S81802D Unspecified open wound, left lower leg, subsequent encounter: Secondary | ICD-10-CM | POA: Diagnosis not present

## 2014-06-07 DIAGNOSIS — Z6839 Body mass index (BMI) 39.0-39.9, adult: Secondary | ICD-10-CM | POA: Diagnosis not present

## 2014-06-07 DIAGNOSIS — N183 Chronic kidney disease, stage 3 (moderate): Secondary | ICD-10-CM | POA: Diagnosis not present

## 2014-06-07 DIAGNOSIS — F419 Anxiety disorder, unspecified: Secondary | ICD-10-CM | POA: Diagnosis not present

## 2014-06-07 DIAGNOSIS — F319 Bipolar disorder, unspecified: Secondary | ICD-10-CM | POA: Diagnosis not present

## 2014-06-08 DIAGNOSIS — N183 Chronic kidney disease, stage 3 (moderate): Secondary | ICD-10-CM | POA: Diagnosis not present

## 2014-06-08 DIAGNOSIS — M6281 Muscle weakness (generalized): Secondary | ICD-10-CM | POA: Diagnosis not present

## 2014-06-08 DIAGNOSIS — M1990 Unspecified osteoarthritis, unspecified site: Secondary | ICD-10-CM | POA: Diagnosis not present

## 2014-06-08 DIAGNOSIS — I129 Hypertensive chronic kidney disease with stage 1 through stage 4 chronic kidney disease, or unspecified chronic kidney disease: Secondary | ICD-10-CM | POA: Diagnosis not present

## 2014-06-08 DIAGNOSIS — S81802D Unspecified open wound, left lower leg, subsequent encounter: Secondary | ICD-10-CM | POA: Diagnosis not present

## 2014-06-09 DIAGNOSIS — M6281 Muscle weakness (generalized): Secondary | ICD-10-CM | POA: Diagnosis not present

## 2014-06-09 DIAGNOSIS — I129 Hypertensive chronic kidney disease with stage 1 through stage 4 chronic kidney disease, or unspecified chronic kidney disease: Secondary | ICD-10-CM | POA: Diagnosis not present

## 2014-06-09 DIAGNOSIS — N183 Chronic kidney disease, stage 3 (moderate): Secondary | ICD-10-CM | POA: Diagnosis not present

## 2014-06-09 DIAGNOSIS — S81802D Unspecified open wound, left lower leg, subsequent encounter: Secondary | ICD-10-CM | POA: Diagnosis not present

## 2014-06-09 DIAGNOSIS — M1990 Unspecified osteoarthritis, unspecified site: Secondary | ICD-10-CM | POA: Diagnosis not present

## 2014-06-10 DIAGNOSIS — M6281 Muscle weakness (generalized): Secondary | ICD-10-CM | POA: Diagnosis not present

## 2014-06-10 DIAGNOSIS — I129 Hypertensive chronic kidney disease with stage 1 through stage 4 chronic kidney disease, or unspecified chronic kidney disease: Secondary | ICD-10-CM | POA: Diagnosis not present

## 2014-06-10 DIAGNOSIS — S81802D Unspecified open wound, left lower leg, subsequent encounter: Secondary | ICD-10-CM | POA: Diagnosis not present

## 2014-06-10 DIAGNOSIS — N183 Chronic kidney disease, stage 3 (moderate): Secondary | ICD-10-CM | POA: Diagnosis not present

## 2014-06-10 DIAGNOSIS — M1990 Unspecified osteoarthritis, unspecified site: Secondary | ICD-10-CM | POA: Diagnosis not present

## 2014-06-13 DIAGNOSIS — S81802D Unspecified open wound, left lower leg, subsequent encounter: Secondary | ICD-10-CM | POA: Diagnosis not present

## 2014-06-13 DIAGNOSIS — M1990 Unspecified osteoarthritis, unspecified site: Secondary | ICD-10-CM | POA: Diagnosis not present

## 2014-06-13 DIAGNOSIS — I129 Hypertensive chronic kidney disease with stage 1 through stage 4 chronic kidney disease, or unspecified chronic kidney disease: Secondary | ICD-10-CM | POA: Diagnosis not present

## 2014-06-13 DIAGNOSIS — N183 Chronic kidney disease, stage 3 (moderate): Secondary | ICD-10-CM | POA: Diagnosis not present

## 2014-06-13 DIAGNOSIS — M6281 Muscle weakness (generalized): Secondary | ICD-10-CM | POA: Diagnosis not present

## 2014-06-15 ENCOUNTER — Emergency Department (HOSPITAL_COMMUNITY): Payer: Medicare Other

## 2014-06-15 ENCOUNTER — Encounter (HOSPITAL_COMMUNITY): Payer: Self-pay | Admitting: Emergency Medicine

## 2014-06-15 ENCOUNTER — Emergency Department (HOSPITAL_COMMUNITY)
Admission: EM | Admit: 2014-06-15 | Discharge: 2014-06-15 | Disposition: A | Discharge status: Home / Self Care | Payer: Medicare Other | Attending: Emergency Medicine | Admitting: Emergency Medicine

## 2014-06-15 DIAGNOSIS — M6281 Muscle weakness (generalized): Secondary | ICD-10-CM | POA: Diagnosis not present

## 2014-06-15 DIAGNOSIS — Z8719 Personal history of other diseases of the digestive system: Secondary | ICD-10-CM | POA: Insufficient documentation

## 2014-06-15 DIAGNOSIS — N183 Chronic kidney disease, stage 3 (moderate): Secondary | ICD-10-CM | POA: Diagnosis not present

## 2014-06-15 DIAGNOSIS — Y9289 Other specified places as the place of occurrence of the external cause: Secondary | ICD-10-CM | POA: Insufficient documentation

## 2014-06-15 DIAGNOSIS — I129 Hypertensive chronic kidney disease with stage 1 through stage 4 chronic kidney disease, or unspecified chronic kidney disease: Secondary | ICD-10-CM | POA: Diagnosis not present

## 2014-06-15 DIAGNOSIS — F319 Bipolar disorder, unspecified: Secondary | ICD-10-CM | POA: Diagnosis not present

## 2014-06-15 DIAGNOSIS — M1990 Unspecified osteoarthritis, unspecified site: Secondary | ICD-10-CM | POA: Insufficient documentation

## 2014-06-15 DIAGNOSIS — I10 Essential (primary) hypertension: Secondary | ICD-10-CM | POA: Diagnosis not present

## 2014-06-15 DIAGNOSIS — Z7982 Long term (current) use of aspirin: Secondary | ICD-10-CM | POA: Diagnosis not present

## 2014-06-15 DIAGNOSIS — S79911A Unspecified injury of right hip, initial encounter: Secondary | ICD-10-CM | POA: Insufficient documentation

## 2014-06-15 DIAGNOSIS — E785 Hyperlipidemia, unspecified: Secondary | ICD-10-CM | POA: Diagnosis not present

## 2014-06-15 DIAGNOSIS — M25551 Pain in right hip: Secondary | ICD-10-CM | POA: Diagnosis not present

## 2014-06-15 DIAGNOSIS — W010XXA Fall on same level from slipping, tripping and stumbling without subsequent striking against object, initial encounter: Secondary | ICD-10-CM | POA: Insufficient documentation

## 2014-06-15 DIAGNOSIS — S79912A Unspecified injury of left hip, initial encounter: Secondary | ICD-10-CM | POA: Diagnosis not present

## 2014-06-15 DIAGNOSIS — W19XXXA Unspecified fall, initial encounter: Secondary | ICD-10-CM

## 2014-06-15 DIAGNOSIS — Y998 Other external cause status: Secondary | ICD-10-CM | POA: Insufficient documentation

## 2014-06-15 DIAGNOSIS — M25552 Pain in left hip: Secondary | ICD-10-CM | POA: Diagnosis not present

## 2014-06-15 DIAGNOSIS — Z792 Long term (current) use of antibiotics: Secondary | ICD-10-CM | POA: Insufficient documentation

## 2014-06-15 DIAGNOSIS — Y9389 Activity, other specified: Secondary | ICD-10-CM | POA: Insufficient documentation

## 2014-06-15 DIAGNOSIS — Z880 Allergy status to penicillin: Secondary | ICD-10-CM | POA: Insufficient documentation

## 2014-06-15 DIAGNOSIS — S81802D Unspecified open wound, left lower leg, subsequent encounter: Secondary | ICD-10-CM | POA: Diagnosis not present

## 2014-06-15 DIAGNOSIS — R0902 Hypoxemia: Secondary | ICD-10-CM | POA: Diagnosis not present

## 2014-06-15 LAB — CBC WITH DIFFERENTIAL/PLATELET
BASOS ABS: 0 10*3/uL (ref 0.0–0.1)
BASOS PCT: 0 % (ref 0–1)
EOS ABS: 0.2 10*3/uL (ref 0.0–0.7)
EOS PCT: 2 % (ref 0–5)
HCT: 34.5 % — ABNORMAL LOW (ref 36.0–46.0)
Hemoglobin: 10.3 g/dL — ABNORMAL LOW (ref 12.0–15.0)
LYMPHS ABS: 1.1 10*3/uL (ref 0.7–4.0)
Lymphocytes Relative: 15 % (ref 12–46)
MCH: 28.5 pg (ref 26.0–34.0)
MCHC: 29.9 g/dL — ABNORMAL LOW (ref 30.0–36.0)
MCV: 95.6 fL (ref 78.0–100.0)
MONO ABS: 0.7 10*3/uL (ref 0.1–1.0)
Monocytes Relative: 9 % (ref 3–12)
NEUTROS PCT: 74 % (ref 43–77)
Neutro Abs: 5.3 10*3/uL (ref 1.7–7.7)
PLATELETS: 236 10*3/uL (ref 150–400)
RBC: 3.61 MIL/uL — ABNORMAL LOW (ref 3.87–5.11)
RDW: 15.2 % (ref 11.5–15.5)
WBC: 7.2 10*3/uL (ref 4.0–10.5)

## 2014-06-15 LAB — COMPREHENSIVE METABOLIC PANEL
ALT: 18 U/L (ref 0–35)
ANION GAP: 6 (ref 5–15)
AST: 23 U/L (ref 0–37)
Albumin: 3.7 g/dL (ref 3.5–5.2)
Alkaline Phosphatase: 124 U/L — ABNORMAL HIGH (ref 39–117)
BUN: 36 mg/dL — ABNORMAL HIGH (ref 6–23)
CALCIUM: 9.8 mg/dL (ref 8.4–10.5)
CO2: 28 mmol/L (ref 19–32)
Chloride: 107 mmol/L (ref 96–112)
Creatinine, Ser: 1.55 mg/dL — ABNORMAL HIGH (ref 0.50–1.10)
GFR calc non Af Amer: 32 mL/min — ABNORMAL LOW (ref >90)
GFR, EST AFRICAN AMERICAN: 37 mL/min — AB (ref >90)
GLUCOSE: 103 mg/dL — AB (ref 70–99)
POTASSIUM: 4.2 mmol/L (ref 3.5–5.1)
Sodium: 141 mmol/L (ref 135–145)
TOTAL PROTEIN: 7.2 g/dL (ref 6.0–8.3)
Total Bilirubin: 0.5 mg/dL (ref 0.3–1.2)

## 2014-06-15 LAB — I-STAT CG4 LACTIC ACID, ED: LACTIC ACID, VENOUS: 0.92 mmol/L (ref 0.5–2.0)

## 2014-06-15 MED ORDER — FENTANYL CITRATE 0.05 MG/ML IJ SOLN
25.0000 ug | Freq: Once | INTRAMUSCULAR | Status: AC
  Administered 2014-06-15: 25 ug via INTRAVENOUS
  Filled 2014-06-15: qty 2

## 2014-06-15 NOTE — Discharge Instructions (Signed)
As discussed, your evaluation today has been largely reassuring.  But, it is important that you monitor your condition carefully, and do not hesitate to return to the ED if you develop new, or concerning changes in your condition. ? ?Otherwise, please follow-up with your physician for appropriate ongoing care. ? ?

## 2014-06-15 NOTE — ED Provider Notes (Signed)
CSN: [id number]     Arrival date & time 06/15/14  1701 History  This chart was scribed for Gerhard Munch, MD by Tonye Royalty, ED Scribe. This patient was seen in room APA18/APA18 and the patient's care was started at 5:16 PM.    Chief Complaint  Patient presents with  . Fall   The history is provided by the patient. No language interpreter was used.    HPI Comments: Amy Clay is a 74 y.o. female who presents to the Emergency Department complaining of pain to bilateral hips after slipping out of her chair from sleep and falling to the floor earlier today. She reports pain to bilateral hips which has become worse on the left side. She states she has been able to walk but notes she is shaky, which is normal for her. She denies loss of sensation in her hips, knees, or ankles. She is unsure if she struck her head or other parts of her body.  Past Medical History  Diagnosis Date  . High blood pressure   . Arthritis   . Depression   . Anxiety   . Hemorrhoids   . Hyperlipidemia   . Bipolar 1 disorder    Past Surgical History  Procedure Laterality Date  . Back surgery  03/16/87  . Cholecystectomy  08/15/93  . Partial hysterectomy  04/04/95  . Bladder tacked  04/04/95  . Ankle surgery  04/28/97    right ankle fracture  . Wrist surgery  02/24/09    right wrist fracture  . Colonoscopy  10/26/2002    Dr. Karilyn Cota- small external hemorrhoids otherwise normal   . Breast biopsy  05/16/95    right side  . Savory dilation  10/31/2011    Procedure: SAVORY DILATION;  Surgeon: Corbin Ade, MD;  [facility];  Service: Endoscopy;  Laterality: N/A;  Elease Hashimoto dilation  10/31/2011    Procedure: Elease Hashimoto DILATION;  Surgeon: Corbin Ade, MD;  [facility];  Service: Endoscopy;  Laterality: N/A;  . Abdominal hysterectomy     Family History  Problem Relation Age of Onset  . Arthritis    . Asthma    . Diabetes    . Colon cancer Neg Hx   . Anxiety disorder Paternal Aunt   .  Anxiety disorder Paternal Uncle   . Bipolar disorder Cousin    History  Substance Use Topics  . Smoking status: Never Smoker   . Smokeless tobacco: Never Used  . Alcohol Use: No   OB History    Gravida Para Term Preterm AB TAB SAB Ectopic Multiple Living            0     Review of Systems  Constitutional:       Per HPI, otherwise negative  HENT:       Per HPI, otherwise negative  Respiratory:       Per HPI, otherwise negative  Cardiovascular:       Per HPI, otherwise negative  Gastrointestinal: Negative for vomiting.  Endocrine:       Negative aside from HPI  Genitourinary:       Neg aside from HPI   Musculoskeletal:       Positive hip pain Per HPI, otherwise negative  Skin: Negative.   Neurological: Negative for syncope.      Allergies  Benzodiazepines; Ciprofloxacin; Cephalexin; and Penicillins  Home Medications   Prior to Admission medications   Medication Sig Start Date End Date Taking? Authorizing  Provider  acetaminophen (TYLENOL) 500 MG tablet Take 1,000 mg by mouth every 6 (six) hours as needed for pain.   Yes Historical Provider, MD  aspirin EC 81 MG tablet Take 81 mg by mouth every morning.    Yes Historical Provider, MD  benztropine (COGENTIN) 0.5 MG tablet Take 1 tablet (0.5 mg total) by mouth 2 (two) times daily. 04/06/14 04/06/15 Yes Diannia Ruder, MD  beta carotene w/minerals (OCUVITE) tablet Take 1 tablet by mouth every morning.    Yes Historical Provider, MD  Biotin 5000 MCG CAPS Take 1 capsule by mouth every morning.   Yes Historical Provider, MD  buPROPion (WELLBUTRIN XL) 300 MG 24 hr tablet Take 1 tablet (300 mg total) by mouth every morning. 04/06/14 04/06/15 Yes Diannia Ruder, MD  calcium carbonate (OS-CAL) 600 MG TABS Take 1,200 mg by mouth daily with breakfast.    Yes Historical Provider, MD  Cholecalciferol (VITAMIN D) 2000 UNITS CAPS Take 1 capsule by mouth every morning.    Yes Historical Provider, MD  clonazePAM (KLONOPIN) 0.5 MG tablet  Take 1 tablet (0.5 mg total) by mouth 2 (two) times daily as needed for anxiety. 04/06/14  Yes Diannia Ruder, MD  docusate sodium (STOOL SOFTENER) 100 MG capsule Take 200 mg by mouth at bedtime.   Yes Historical Provider, MD  furosemide (LASIX) 20 MG tablet Take 20 mg by mouth every Monday, Wednesday, and Friday.  01/26/14  Yes Historical Provider, MD  lamoTRIgine (LAMICTAL) 100 MG tablet Take 1 tablet (100 mg total) by mouth 2 (two) times daily. 04/06/14  Yes Diannia Ruder, MD  losartan (COZAAR) 100 MG tablet Take 100 mg by mouth every morning.  10/04/11  Yes Historical Provider, MD  multivitamin (PROSIGHT) TABS tablet Take 1 tablet by mouth daily.   Yes Historical Provider, MD  mupirocin ointment (BACTROBAN) 2 % Place 1 application into the nose 3 (three) times daily as needed (wound care).   Yes Historical Provider, MD  omeprazole (PRILOSEC) 40 MG capsule Take 40 mg by mouth daily.   Yes Historical Provider, MD  PARoxetine (PAXIL) 40 MG tablet Take 1 tablet (40 mg total) by mouth at bedtime. 04/06/14 04/06/15 Yes Diannia Ruder, MD  polyethylene glycol [facility] Ethelene Hal) packet Take 17 g by mouth daily as needed for mild constipation.   Yes Historical Provider, MD  risperiDONE (RISPERDAL) 0.25 MG tablet Take 1 tablet (0.25 mg total) by mouth 3 (three) times daily. Takes at 8:00AM, 2:00PM, 5:00PM. Patient taking differently: Take 0.25 mg by mouth 3 (three) times daily.  03/09/14  Yes Diannia Ruder, MD  risperiDONE (RISPERDAL) 1 MG tablet Take 1 tablet (1 mg total) by mouth at bedtime. 04/06/14  Yes Diannia Ruder, MD  solifenacin (VESICARE) 10 MG tablet Take 10 mg by mouth every morning.    Yes Historical Provider, MD  terazosin (HYTRIN) 2 MG capsule Take 2 mg by mouth at bedtime.    Yes Historical Provider, MD  vitamin B-12 (CYANOCOBALAMIN) 1000 MCG tablet Take 1,000 mcg by mouth daily.   Yes Historical Provider, MD  vitamin C (ASCORBIC ACID) 500 MG tablet Take 1,000 mg by mouth daily.    Yes Historical  Provider, MD  loratadine (CLARITIN) 10 MG tablet Take 10 mg by mouth daily as needed for allergies.    Historical Provider, MD  sulfamethoxazole-trimethoprim (SEPTRA DS) 800-160 MG per tablet Take 1 tablet by mouth every 12 (twelve) hours. Patient not taking: Reported on 06/15/2014 04/28/14   Joya Gaskins, MD   BP  149/81 mmHg  Pulse 76  Temp(Src) 97.9 F (36.6 C) (Oral)  Resp 18  Ht 5\' 6"  (1.676 m)  Wt 230 lb (104.327 kg)  BMI 37.14 kg/m2  SpO2 99% Physical Exam  Constitutional: She is oriented to person, place, and time. She appears well-developed and well-nourished. No distress.  HENT:  Head: Normocephalic and atraumatic.  Eyes: Conjunctivae and EOM are normal.  Cardiovascular: Normal rate, regular rhythm and normal heart sounds.   Pulmonary/Chest: Effort normal and breath sounds normal. No stridor. No respiratory distress. She has no wheezes. She has no rales.  Abdominal: Soft. She exhibits no distension.  Musculoskeletal: She exhibits no edema.  No hip tenderness Pelvis stable Can flex each hip independently Can flex each knee indepedently  Neurological: She is alert and oriented to person, place, and time. No cranial nerve deficit.  Skin: Skin is warm and dry.  Psychiatric: She has a normal mood and affect.  Nursing note and vitals reviewed.   ED Course  Procedures   COORDINATION OF CARE: 5:21 PM Discussed treatment plan with patient at beside, including x-rays. The patient agrees with the plan and has no further questions at this time.   Labs Review Labs Reviewed  COMPREHENSIVE METABOLIC PANEL - Abnormal; Notable for the following:    Glucose, Bld 103 (*)    BUN 36 (*)    Creatinine, Ser 1.55 (*)    Alkaline Phosphatase 124 (*)    GFR calc non Af Amer 32 (*)    GFR calc Af Amer 37 (*)    All other components within normal limits  CBC WITH DIFFERENTIAL/PLATELET - Abnormal; Notable for the following:    RBC 3.61 (*)    Hemoglobin 10.3 (*)    HCT 34.5 (*)     MCHC 29.9 (*)    All other components within normal limits  URINALYSIS, ROUTINE W REFLEX MICROSCOPIC  I-STAT CG4 LACTIC ACID, ED    Imaging Review Dg Hips Bilat With Pelvis 3-4 Views  06/15/2014   CLINICAL DATA:  [age] year old female with bilateral hip pain (left greater than right) after falling out of bed this morning.  EXAM: BILATERAL HIP (WITH PELVIS) 3-4 VIEWS  COMPARISON:  No priors.  FINDINGS: AP view of the pelvis demonstrates no acute displaced fracture of the bony pelvic ring. Bilateral proximal femurs as visualized are intact, the femoral heads are located bilaterally. Joint space narrowing, subchondral sclerosis, subchondral cyst formation and osteophyte formation is noted in the hip joints bilaterally, compatible with mild osteoarthritis.  IMPRESSION: 1. No acute radiographic abnormality of the bony pelvis or either hip. 2. Mild bilateral hip joint osteoarthritis.   Electronically Signed   By: Trudie Reedaniel  Entrikin M.D.   On: 06/15/2014 18:23   7:51 PM Patient requests return to her NH.   MDM   Final diagnoses:  Fall   Elderly female presents from nursing facility after a minor fall. Here the patient is awake, alert, neurologically intact, hemodynamically stable. No evidence for acute pathology. Patient does have mild renal disease, mild anemia, no notable changes. Patient requested discharge to her nursing facility, was encouraged to follow-up with her primary care physician.  I personally performed the services described in this documentation, which was scribed in my presence. The recorded information has been reviewed and is accurate.     Gerhard Munchobert Mishal Probert, MD 06/15/14 [id number]

## 2014-06-15 NOTE — ED Notes (Signed)
EMS reports pt slid out of her chair onto the floor sometime last night or early am today. Pt c/o bilat hip pain.

## 2014-06-16 DIAGNOSIS — S81802D Unspecified open wound, left lower leg, subsequent encounter: Secondary | ICD-10-CM | POA: Diagnosis not present

## 2014-06-16 DIAGNOSIS — M1990 Unspecified osteoarthritis, unspecified site: Secondary | ICD-10-CM | POA: Diagnosis not present

## 2014-06-16 DIAGNOSIS — I129 Hypertensive chronic kidney disease with stage 1 through stage 4 chronic kidney disease, or unspecified chronic kidney disease: Secondary | ICD-10-CM | POA: Diagnosis not present

## 2014-06-16 DIAGNOSIS — N183 Chronic kidney disease, stage 3 (moderate): Secondary | ICD-10-CM | POA: Diagnosis not present

## 2014-06-16 DIAGNOSIS — M6281 Muscle weakness (generalized): Secondary | ICD-10-CM | POA: Diagnosis not present

## 2014-06-17 DIAGNOSIS — M6281 Muscle weakness (generalized): Secondary | ICD-10-CM | POA: Diagnosis not present

## 2014-06-17 DIAGNOSIS — N183 Chronic kidney disease, stage 3 (moderate): Secondary | ICD-10-CM | POA: Diagnosis not present

## 2014-06-17 DIAGNOSIS — I129 Hypertensive chronic kidney disease with stage 1 through stage 4 chronic kidney disease, or unspecified chronic kidney disease: Secondary | ICD-10-CM | POA: Diagnosis not present

## 2014-06-17 DIAGNOSIS — M1990 Unspecified osteoarthritis, unspecified site: Secondary | ICD-10-CM | POA: Diagnosis not present

## 2014-06-17 DIAGNOSIS — S81802D Unspecified open wound, left lower leg, subsequent encounter: Secondary | ICD-10-CM | POA: Diagnosis not present

## 2014-06-20 ENCOUNTER — Emergency Department (HOSPITAL_COMMUNITY)
Admission: EM | Admit: 2014-06-20 | Discharge: 2014-06-21 | Disposition: A | Discharge status: Home / Self Care | Payer: Medicare Other | Attending: Emergency Medicine | Admitting: Emergency Medicine

## 2014-06-20 ENCOUNTER — Encounter (HOSPITAL_COMMUNITY): Payer: Self-pay | Admitting: *Deleted

## 2014-06-20 DIAGNOSIS — F319 Bipolar disorder, unspecified: Secondary | ICD-10-CM | POA: Diagnosis not present

## 2014-06-20 DIAGNOSIS — M1990 Unspecified osteoarthritis, unspecified site: Secondary | ICD-10-CM | POA: Diagnosis not present

## 2014-06-20 DIAGNOSIS — M6281 Muscle weakness (generalized): Secondary | ICD-10-CM | POA: Diagnosis not present

## 2014-06-20 DIAGNOSIS — Z8639 Personal history of other endocrine, nutritional and metabolic disease: Secondary | ICD-10-CM | POA: Insufficient documentation

## 2014-06-20 DIAGNOSIS — R609 Edema, unspecified: Secondary | ICD-10-CM

## 2014-06-20 DIAGNOSIS — Z8719 Personal history of other diseases of the digestive system: Secondary | ICD-10-CM | POA: Diagnosis not present

## 2014-06-20 DIAGNOSIS — Z79899 Other long term (current) drug therapy: Secondary | ICD-10-CM | POA: Diagnosis not present

## 2014-06-20 DIAGNOSIS — F419 Anxiety disorder, unspecified: Secondary | ICD-10-CM | POA: Diagnosis not present

## 2014-06-20 DIAGNOSIS — S81802D Unspecified open wound, left lower leg, subsequent encounter: Secondary | ICD-10-CM | POA: Diagnosis not present

## 2014-06-20 DIAGNOSIS — R600 Localized edema: Secondary | ICD-10-CM | POA: Insufficient documentation

## 2014-06-20 DIAGNOSIS — Z792 Long term (current) use of antibiotics: Secondary | ICD-10-CM | POA: Insufficient documentation

## 2014-06-20 DIAGNOSIS — N183 Chronic kidney disease, stage 3 (moderate): Secondary | ICD-10-CM | POA: Diagnosis not present

## 2014-06-20 DIAGNOSIS — Z880 Allergy status to penicillin: Secondary | ICD-10-CM | POA: Insufficient documentation

## 2014-06-20 DIAGNOSIS — Z7982 Long term (current) use of aspirin: Secondary | ICD-10-CM | POA: Insufficient documentation

## 2014-06-20 DIAGNOSIS — I129 Hypertensive chronic kidney disease with stage 1 through stage 4 chronic kidney disease, or unspecified chronic kidney disease: Secondary | ICD-10-CM | POA: Diagnosis not present

## 2014-06-20 DIAGNOSIS — M7989 Other specified soft tissue disorders: Secondary | ICD-10-CM | POA: Diagnosis present

## 2014-06-20 LAB — COMPREHENSIVE METABOLIC PANEL
ALK PHOS: 113 U/L (ref 39–117)
ALT: 17 U/L (ref 0–35)
ANION GAP: 3 — AB (ref 5–15)
AST: 22 U/L (ref 0–37)
Albumin: 3.6 g/dL (ref 3.5–5.2)
BILIRUBIN TOTAL: 0.3 mg/dL (ref 0.3–1.2)
BUN: 36 mg/dL — ABNORMAL HIGH (ref 6–23)
CALCIUM: 9.5 mg/dL (ref 8.4–10.5)
CO2: 29 mmol/L (ref 19–32)
CREATININE: 1.71 mg/dL — AB (ref 0.50–1.10)
Chloride: 108 mmol/L (ref 96–112)
GFR calc Af Amer: 33 mL/min — ABNORMAL LOW (ref >90)
GFR calc non Af Amer: 28 mL/min — ABNORMAL LOW (ref >90)
Glucose, Bld: 113 mg/dL — ABNORMAL HIGH (ref 70–99)
POTASSIUM: 4.2 mmol/L (ref 3.5–5.1)
Sodium: 140 mmol/L (ref 135–145)
Total Protein: 7.4 g/dL (ref 6.0–8.3)

## 2014-06-20 LAB — CBC WITH DIFFERENTIAL/PLATELET
BASOS ABS: 0 10*3/uL (ref 0.0–0.1)
Basophils Relative: 0 % (ref 0–1)
EOS ABS: 0.2 10*3/uL (ref 0.0–0.7)
Eosinophils Relative: 3 % (ref 0–5)
HEMATOCRIT: 35.1 % — AB (ref 36.0–46.0)
HEMOGLOBIN: 10.5 g/dL — AB (ref 12.0–15.0)
Lymphocytes Relative: 13 % (ref 12–46)
Lymphs Abs: 1 10*3/uL (ref 0.7–4.0)
MCH: 28.8 pg (ref 26.0–34.0)
MCHC: 29.9 g/dL — ABNORMAL LOW (ref 30.0–36.0)
MCV: 96.4 fL (ref 78.0–100.0)
Monocytes Absolute: 0.7 10*3/uL (ref 0.1–1.0)
Monocytes Relative: 9 % (ref 3–12)
Neutro Abs: 5.8 10*3/uL (ref 1.7–7.7)
Neutrophils Relative %: 75 % (ref 43–77)
PLATELETS: 246 10*3/uL (ref 150–400)
RBC: 3.64 MIL/uL — ABNORMAL LOW (ref 3.87–5.11)
RDW: 15.2 % (ref 11.5–15.5)
WBC: 7.7 10*3/uL (ref 4.0–10.5)

## 2014-06-20 NOTE — ED Provider Notes (Signed)
CSN: [id number]     Arrival date & time 06/20/14  1839 History   First MD Initiated Contact with Patient 06/20/14 2229    This chart was scribed for No att. providers found by Marica Otter, ED Scribe. This patient was seen in room APA03/[facility]03 and the patient's care was started at 10:39 PM.  LEVEL 5 CAVEAT: Confusion  Chief Complaint  Patient presents with  . Leg Swelling   The history is provided by the patient. No language interpreter was used.   PCP: Colette Ribas, MD HPI Comments: Amy Clay is a 74 y.o. female, with PMH noted below, who presents to the Emergency Department complaining of swelling to BLE with swelling worse in RLE than LLE. Pt also complains of associated pain in BLE.   Past Medical History  Diagnosis Date  . High blood pressure   . Arthritis   . Depression   . Anxiety   . Hemorrhoids   . Hyperlipidemia   . Bipolar 1 disorder    Past Surgical History  Procedure Laterality Date  . Back surgery  03/16/87  . Cholecystectomy  08/15/93  . Partial hysterectomy  04/04/95  . Bladder tacked  04/04/95  . Ankle surgery  04/28/97    right ankle fracture  . Wrist surgery  02/24/09    right wrist fracture  . Colonoscopy  10/26/2002    Dr. Karilyn Cota- small external hemorrhoids otherwise normal   . Breast biopsy  05/16/95    right side  . Savory dilation  10/31/2011    Procedure: SAVORY DILATION;  Surgeon: Corbin Ade, MD;  Location: [facility];  Service: Endoscopy;  Laterality: N/A;  Elease Hashimoto dilation  10/31/2011    Procedure: Elease Hashimoto DILATION;  Surgeon: Corbin Ade, MD;  Location: [facility];  Service: Endoscopy;  Laterality: N/A;  . Abdominal hysterectomy     Family History  Problem Relation Age of Onset  . Arthritis    . Asthma    . Diabetes    . Colon cancer Neg Hx   . Anxiety disorder Paternal Aunt   . Anxiety disorder Paternal Uncle   . Bipolar disorder Cousin    History  Substance Use Topics  . Smoking status: Never Smoker   .  Smokeless tobacco: Never Used  . Alcohol Use: No   OB History    Gravida Para Term Preterm AB TAB SAB Ectopic Multiple Living            0     Review of Systems  Unable to perform ROS: Other      Allergies  Benzodiazepines; Ciprofloxacin; Cephalexin; and Penicillins  Home Medications   Prior to Admission medications   Medication Sig Start Date End Date Taking? Authorizing Provider  acetaminophen (TYLENOL) 500 MG tablet Take 1,000 mg by mouth every 6 (six) hours as needed for pain.    Historical Provider, MD  aspirin EC 81 MG tablet Take 81 mg by mouth every morning.     Historical Provider, MD  benztropine (COGENTIN) 0.5 MG tablet Take 1 tablet (0.5 mg total) by mouth 2 (two) times daily. 04/06/14 04/06/15  Diannia Ruder, MD  beta carotene w/minerals (OCUVITE) tablet Take 1 tablet by mouth every morning.     Historical Provider, MD  Biotin 5000 MCG CAPS Take 1 capsule by mouth every morning.    Historical Provider, MD  buPROPion (WELLBUTRIN XL) 300 MG 24 hr tablet Take 1 tablet (300 mg total) by mouth every morning. 04/06/14  04/06/15  Diannia Rudereborah Ross, MD  calcium carbonate (OS-CAL) 600 MG TABS Take 1,200 mg by mouth daily with breakfast.     Historical Provider, MD  Cholecalciferol (VITAMIN D) 2000 UNITS CAPS Take 1 capsule by mouth every morning.     Historical Provider, MD  clonazePAM (KLONOPIN) 0.5 MG tablet Take 1 tablet (0.5 mg total) by mouth 2 (two) times daily as needed for anxiety. 04/06/14   Diannia Rudereborah Ross, MD  docusate sodium (STOOL SOFTENER) 100 MG capsule Take 200 mg by mouth at bedtime.    Historical Provider, MD  furosemide (LASIX) 20 MG tablet Take 20 mg by mouth every Monday, Wednesday, and Friday.  01/26/14   Historical Provider, MD  lamoTRIgine (LAMICTAL) 100 MG tablet Take 1 tablet (100 mg total) by mouth 2 (two) times daily. 04/06/14   Diannia Rudereborah Ross, MD  loratadine (CLARITIN) 10 MG tablet Take 10 mg by mouth daily as needed for allergies.    Historical Provider, MD   losartan (COZAAR) 100 MG tablet Take 100 mg by mouth every morning.  10/04/11   Historical Provider, MD  multivitamin (PROSIGHT) TABS tablet Take 1 tablet by mouth daily.    Historical Provider, MD  mupirocin ointment (BACTROBAN) 2 % Place 1 application into the nose 3 (three) times daily as needed (wound care).    Historical Provider, MD  omeprazole (PRILOSEC) 40 MG capsule Take 40 mg by mouth daily.    Historical Provider, MD  PARoxetine (PAXIL) 40 MG tablet Take 1 tablet (40 mg total) by mouth at bedtime. 04/06/14 04/06/15  Diannia Rudereborah Ross, MD  polyethylene glycol [facility](MIRALAX / Ethelene HalGLYCOLAX) packet Take 17 g by mouth daily as needed for mild constipation.    Historical Provider, MD  risperiDONE (RISPERDAL) 0.25 MG tablet Take 1 tablet (0.25 mg total) by mouth 3 (three) times daily. Takes at 8:00AM, 2:00PM, 5:00PM. Patient taking differently: Take 0.25 mg by mouth 3 (three) times daily.  03/09/14   Diannia Rudereborah Ross, MD  risperiDONE (RISPERDAL) 1 MG tablet Take 1 tablet (1 mg total) by mouth at bedtime. 04/06/14   Diannia Rudereborah Ross, MD  solifenacin (VESICARE) 10 MG tablet Take 10 mg by mouth every morning.     Historical Provider, MD  sulfamethoxazole-trimethoprim (SEPTRA DS) 800-160 MG per tablet Take 1 tablet by mouth every 12 (twelve) hours. Patient not taking: Reported on 06/15/2014 04/28/14   Joya Gaskinsonald W Wickline, MD  terazosin (HYTRIN) 2 MG capsule Take 2 mg by mouth at bedtime.     Historical Provider, MD  vitamin B-12 (CYANOCOBALAMIN) 1000 MCG tablet Take 1,000 mcg by mouth daily.    Historical Provider, MD  vitamin C (ASCORBIC ACID) 500 MG tablet Take 1,000 mg by mouth daily.     Historical Provider, MD   Triage Vitals: BP 138/61 mmHg  Pulse 75  Temp(Src) 98 F (36.7 C) (Oral)  Resp 18  Ht 5\' 4"  (1.626 m)  Wt 230 lb (104.327 kg)  BMI 39.46 kg/m2  SpO2 99% Physical Exam  Constitutional: She appears well-developed and well-nourished.  HENT:  Head: Normocephalic and atraumatic.  Eyes: Conjunctivae and  EOM are normal. Pupils are equal, round, and reactive to light.  Neck: Normal range of motion and phonation normal. Neck supple.  Cardiovascular: Normal rate and regular rhythm.   Pulmonary/Chest: Effort normal and breath sounds normal. She exhibits no tenderness.  Abdominal: Soft. She exhibits no distension. There is no tenderness. There is no guarding.  Musculoskeletal: Normal range of motion. She exhibits edema.  3+ pitting edema BLE. Skin  over BLE is normal.   Neurological: She is alert. She exhibits normal muscle tone.  Skin: Skin is warm and dry.  Nursing note and vitals reviewed.   ED Course  Procedures (including critical care time) Medications - No data to display  No data found.   Labs Review Labs Reviewed  CBC WITH DIFFERENTIAL/PLATELET - Abnormal; Notable for the following:    RBC 3.64 (*)    Hemoglobin 10.5 (*)    HCT 35.1 (*)    MCHC 29.9 (*)    All other components within normal limits  COMPREHENSIVE METABOLIC PANEL - Abnormal; Notable for the following:    Glucose, Bld 113 (*)    BUN 36 (*)    Creatinine, Ser 1.71 (*)    GFR calc non Af Amer 28 (*)    GFR calc Af Amer 33 (*)    Anion gap 3 (*)    All other components within normal limits    Imaging Review No results found.   EKG Interpretation None      MDM   Final diagnoses:  Peripheral edema    Peripheral edema without complicating features. No evidence for central edema, or suggestion of infection or sob.  Nursing Notes Reviewed/ Care Coordinated Applicable Imaging Reviewed Interpretation of Laboratory Data incorporated into ED treatment  The patient appears reasonably screened and/or stabilized for discharge and I doubt any other medical condition or other [facility] requiring further screening, evaluation, or treatment in the ED at this time prior to discharge.  Plan: Home Medications- usual; Home Treatments- rest; return here if the recommended treatment, does not improve the symptoms;  Recommended follow up- PCP prn   I personally performed the services described in this documentation, which was scribed in my presence. The recorded information has been reviewed and is accurate.     Flint Melter, MD 06/22/14 1256

## 2014-06-20 NOTE — ED Notes (Signed)
Sent to ED for eval of leg swelling, Pt is from [facility]. Pt says both legs bother her.  Rt is worse than left

## 2014-06-20 NOTE — Discharge Instructions (Signed)
Peripheral Edema °You have swelling in your legs (peripheral edema). This swelling is due to excess accumulation of salt and water in your body. Edema may be a sign of heart, kidney or liver disease, or a side effect of a medication. It may also be due to problems in the leg veins. Elevating your legs and using special support stockings may be very helpful, if the cause of the swelling is due to poor venous circulation. Avoid long periods of standing, whatever the cause. °Treatment of edema depends on identifying the cause. Chips, pretzels, pickles and other salty foods should be avoided. Restricting salt in your diet is almost always needed. Water pills (diuretics) are often used to remove the excess salt and water from your body via urine. These medicines prevent the kidney from reabsorbing sodium. This increases urine flow. °Diuretic treatment may also result in lowering of potassium levels in your body. Potassium supplements may be needed if you have to use diuretics daily. Daily weights can help you keep track of your progress in clearing your edema. You should call your caregiver for follow up care as recommended. °SEEK IMMEDIATE MEDICAL CARE IF:  °· You have increased swelling, pain, redness, or heat in your legs. °· You develop shortness of breath, especially when lying down. °· You develop chest or abdominal pain, weakness, or fainting. °· You have a fever. °Document Released: 06/06/2004 Document Revised: 07/22/2011 Document Reviewed: 05/17/2009 °[employer]® Patient Information ©2015 [employer]. This information is not intended to replace advice given to you by your health care provider. Make sure you discuss any questions you have with your health care provider. ° °

## 2014-06-20 NOTE — ED Notes (Signed)
Patient unable to stae why she is here. Phone  [facility] and was informed that the patient had an appt with dr Phillips Odorgolding. Informed her that he does not see er patients.

## 2014-06-21 DIAGNOSIS — S81802D Unspecified open wound, left lower leg, subsequent encounter: Secondary | ICD-10-CM | POA: Diagnosis not present

## 2014-06-21 DIAGNOSIS — M1990 Unspecified osteoarthritis, unspecified site: Secondary | ICD-10-CM | POA: Diagnosis not present

## 2014-06-21 DIAGNOSIS — I129 Hypertensive chronic kidney disease with stage 1 through stage 4 chronic kidney disease, or unspecified chronic kidney disease: Secondary | ICD-10-CM | POA: Diagnosis not present

## 2014-06-21 DIAGNOSIS — M6281 Muscle weakness (generalized): Secondary | ICD-10-CM | POA: Diagnosis not present

## 2014-06-21 DIAGNOSIS — N183 Chronic kidney disease, stage 3 (moderate): Secondary | ICD-10-CM | POA: Diagnosis not present

## 2014-06-23 DIAGNOSIS — M1990 Unspecified osteoarthritis, unspecified site: Secondary | ICD-10-CM | POA: Diagnosis not present

## 2014-06-23 DIAGNOSIS — S81802D Unspecified open wound, left lower leg, subsequent encounter: Secondary | ICD-10-CM | POA: Diagnosis not present

## 2014-06-23 DIAGNOSIS — M6281 Muscle weakness (generalized): Secondary | ICD-10-CM | POA: Diagnosis not present

## 2014-06-23 DIAGNOSIS — I129 Hypertensive chronic kidney disease with stage 1 through stage 4 chronic kidney disease, or unspecified chronic kidney disease: Secondary | ICD-10-CM | POA: Diagnosis not present

## 2014-06-23 DIAGNOSIS — N183 Chronic kidney disease, stage 3 (moderate): Secondary | ICD-10-CM | POA: Diagnosis not present

## 2014-06-28 DIAGNOSIS — M6281 Muscle weakness (generalized): Secondary | ICD-10-CM | POA: Diagnosis not present

## 2014-06-28 DIAGNOSIS — M1990 Unspecified osteoarthritis, unspecified site: Secondary | ICD-10-CM | POA: Diagnosis not present

## 2014-06-28 DIAGNOSIS — N183 Chronic kidney disease, stage 3 (moderate): Secondary | ICD-10-CM | POA: Diagnosis not present

## 2014-06-28 DIAGNOSIS — S81802D Unspecified open wound, left lower leg, subsequent encounter: Secondary | ICD-10-CM | POA: Diagnosis not present

## 2014-06-28 DIAGNOSIS — I129 Hypertensive chronic kidney disease with stage 1 through stage 4 chronic kidney disease, or unspecified chronic kidney disease: Secondary | ICD-10-CM | POA: Diagnosis not present

## 2014-06-29 DIAGNOSIS — F319 Bipolar disorder, unspecified: Secondary | ICD-10-CM | POA: Diagnosis not present

## 2014-06-29 DIAGNOSIS — R609 Edema, unspecified: Secondary | ICD-10-CM | POA: Diagnosis not present

## 2014-06-29 DIAGNOSIS — Z681 Body mass index (BMI) 19 or less, adult: Secondary | ICD-10-CM | POA: Diagnosis not present

## 2014-06-30 DIAGNOSIS — M6281 Muscle weakness (generalized): Secondary | ICD-10-CM | POA: Diagnosis not present

## 2014-06-30 DIAGNOSIS — M1990 Unspecified osteoarthritis, unspecified site: Secondary | ICD-10-CM | POA: Diagnosis not present

## 2014-06-30 DIAGNOSIS — N183 Chronic kidney disease, stage 3 (moderate): Secondary | ICD-10-CM | POA: Diagnosis not present

## 2014-06-30 DIAGNOSIS — I129 Hypertensive chronic kidney disease with stage 1 through stage 4 chronic kidney disease, or unspecified chronic kidney disease: Secondary | ICD-10-CM | POA: Diagnosis not present

## 2014-06-30 DIAGNOSIS — S81802D Unspecified open wound, left lower leg, subsequent encounter: Secondary | ICD-10-CM | POA: Diagnosis not present

## 2014-07-01 DIAGNOSIS — N183 Chronic kidney disease, stage 3 (moderate): Secondary | ICD-10-CM | POA: Diagnosis not present

## 2014-07-01 DIAGNOSIS — M6281 Muscle weakness (generalized): Secondary | ICD-10-CM | POA: Diagnosis not present

## 2014-07-01 DIAGNOSIS — S81802D Unspecified open wound, left lower leg, subsequent encounter: Secondary | ICD-10-CM | POA: Diagnosis not present

## 2014-07-01 DIAGNOSIS — I129 Hypertensive chronic kidney disease with stage 1 through stage 4 chronic kidney disease, or unspecified chronic kidney disease: Secondary | ICD-10-CM | POA: Diagnosis not present

## 2014-07-01 DIAGNOSIS — M1990 Unspecified osteoarthritis, unspecified site: Secondary | ICD-10-CM | POA: Diagnosis not present

## 2014-07-02 DIAGNOSIS — N183 Chronic kidney disease, stage 3 (moderate): Secondary | ICD-10-CM | POA: Diagnosis not present

## 2014-07-02 DIAGNOSIS — M6281 Muscle weakness (generalized): Secondary | ICD-10-CM | POA: Diagnosis not present

## 2014-07-02 DIAGNOSIS — M1990 Unspecified osteoarthritis, unspecified site: Secondary | ICD-10-CM | POA: Diagnosis not present

## 2014-07-02 DIAGNOSIS — S81802D Unspecified open wound, left lower leg, subsequent encounter: Secondary | ICD-10-CM | POA: Diagnosis not present

## 2014-07-02 DIAGNOSIS — I129 Hypertensive chronic kidney disease with stage 1 through stage 4 chronic kidney disease, or unspecified chronic kidney disease: Secondary | ICD-10-CM | POA: Diagnosis not present

## 2014-07-04 DIAGNOSIS — S81802D Unspecified open wound, left lower leg, subsequent encounter: Secondary | ICD-10-CM | POA: Diagnosis not present

## 2014-07-04 DIAGNOSIS — M1990 Unspecified osteoarthritis, unspecified site: Secondary | ICD-10-CM | POA: Diagnosis not present

## 2014-07-04 DIAGNOSIS — N183 Chronic kidney disease, stage 3 (moderate): Secondary | ICD-10-CM | POA: Diagnosis not present

## 2014-07-04 DIAGNOSIS — I129 Hypertensive chronic kidney disease with stage 1 through stage 4 chronic kidney disease, or unspecified chronic kidney disease: Secondary | ICD-10-CM | POA: Diagnosis not present

## 2014-07-04 DIAGNOSIS — M6281 Muscle weakness (generalized): Secondary | ICD-10-CM | POA: Diagnosis not present

## 2014-07-07 ENCOUNTER — Ambulatory Visit (INDEPENDENT_AMBULATORY_CARE_PROVIDER_SITE_OTHER): Payer: Medicare Other | Admitting: Psychiatry

## 2014-07-07 ENCOUNTER — Encounter (HOSPITAL_COMMUNITY): Payer: Self-pay | Admitting: Psychiatry

## 2014-07-07 VITALS — BP 142/82 | HR 80 | Ht 64.0 in

## 2014-07-07 DIAGNOSIS — I129 Hypertensive chronic kidney disease with stage 1 through stage 4 chronic kidney disease, or unspecified chronic kidney disease: Secondary | ICD-10-CM | POA: Diagnosis not present

## 2014-07-07 DIAGNOSIS — F29 Unspecified psychosis not due to a substance or known physiological condition: Secondary | ICD-10-CM

## 2014-07-07 DIAGNOSIS — F09 Unspecified mental disorder due to known physiological condition: Secondary | ICD-10-CM

## 2014-07-07 DIAGNOSIS — F329 Major depressive disorder, single episode, unspecified: Secondary | ICD-10-CM

## 2014-07-07 DIAGNOSIS — M6281 Muscle weakness (generalized): Secondary | ICD-10-CM | POA: Diagnosis not present

## 2014-07-07 DIAGNOSIS — M1990 Unspecified osteoarthritis, unspecified site: Secondary | ICD-10-CM | POA: Diagnosis not present

## 2014-07-07 DIAGNOSIS — S81802D Unspecified open wound, left lower leg, subsequent encounter: Secondary | ICD-10-CM | POA: Diagnosis not present

## 2014-07-07 DIAGNOSIS — F331 Major depressive disorder, recurrent, moderate: Secondary | ICD-10-CM

## 2014-07-07 DIAGNOSIS — F5105 Insomnia due to other mental disorder: Secondary | ICD-10-CM

## 2014-07-07 DIAGNOSIS — N183 Chronic kidney disease, stage 3 (moderate): Secondary | ICD-10-CM | POA: Diagnosis not present

## 2014-07-07 MED ORDER — PAROXETINE HCL 40 MG PO TABS: 40.0000 mg | ORAL_TABLET | Freq: Every day | ORAL | Status: DC

## 2014-07-07 MED ORDER — BUPROPION HCL ER (XL) 300 MG PO TB24: 300.0000 mg | ORAL_TABLET | ORAL | Status: DC

## 2014-07-07 MED ORDER — RISPERIDONE 1 MG PO TABS: 1.0000 mg | ORAL_TABLET | Freq: Every day | ORAL | Status: DC

## 2014-07-07 MED ORDER — BENZTROPINE MESYLATE 0.5 MG PO TABS: 0.5000 mg | ORAL_TABLET | Freq: Two times a day (BID) | ORAL | Status: DC

## 2014-07-07 MED ORDER — RISPERIDONE 0.25 MG PO TABS: 0.2500 mg | ORAL_TABLET | Freq: Three times a day (TID) | ORAL | Status: DC

## 2014-07-07 MED ORDER — CLONAZEPAM 0.5 MG PO TABS: 0.5000 mg | ORAL_TABLET | Freq: Two times a day (BID) | ORAL | Status: DC | PRN

## 2014-07-07 MED ORDER — LAMOTRIGINE 100 MG PO TABS: 100.0000 mg | ORAL_TABLET | Freq: Two times a day (BID) | ORAL | Status: DC

## 2014-07-07 NOTE — Progress Notes (Signed)
Patient ID: Amy Clay, female   DOB: 09-23-1940, 74 y.o.   MRN: [id number] Patient ID: Amy Clay, female   DOB: 1941/04/21, 74 y.o.   MRN: [id number] Patient ID: Amy Clay, female   DOB: 03-10-1941, 74 y.o.   MRN: [id number] Patient ID: Amy Clay, female   DOB: 18-Dec-1940, 74 y.o.   MRN: [id number] Patient ID: Amy Clay, female   DOB: June 05, 1940, 74 y.o.   MRN: [id number] Patient ID: Amy Clay, female   DOB: 12-16-1940, 74 y.o.   MRN: [id number] Patient ID: Amy Clay, female   DOB: March 18, 1941, 74 y.o.   MRN: [id number] Patient ID: Amy Clay, female   DOB: [birth year], 74 y.o.   MRN: [id number] Patient ID: Amy Clay, female   DOB: 28-Dec-1940, 74 y.o.   MRN: [id number] Patient ID: Amy Clay, female   DOB: 06-11-1940, 74 y.o.   MRN: [id number] [facility] [id number] Progress Note Amy Clay MRN: [id number] DOB: 01/05/1941 Age: 74 y.o.  Date: 07/07/2014  Chief Complaint  Patient presents with  . Depression  . Anxiety  . Follow-up   History of presenting illness Patient is 74 year old Caucasian female who came with her sister for her followup appointment. She [facility] in the [facility]. She and her sister had been living together in the family farm outside of [place]. Neither one has ever been married or have had children. The patient worked until the early 80s when she began to have difficulties with her nerves.  Currently the patient is still struggling with her mental and physical health. She was admitted last year to a psychiatric hospital in [place] because she was anxious nervous and depressed. Last time Dr. Lolly Mustache increased her Risperdal which is helped somewhat. She can't walk and it's not clear why much of it seems to be anxiety based and she seems to have a fear of falling. She's also recently developed possible renal failure and is can be referred to a nephrologist. She was on lithium for  a number of years. We do not have her  recent records from her primary care physician who is outside of [place]  The patient returns after 3 months. She is here with her aide and her sister [name]. Her mood is been somewhat variable but for the most part she is stable. At times she gets very anxious and she worries about everything. She's had a couple of visits to the ER because she thinks she can get up and walk on her own and ends up falling. Her aide reassures me that she is being constantly watched and they tried to get her up and walking with supervision 3 times a day. She's not crying today and is smiling quite a bit and seems to be happier  Past psychiatric history Patient is seeing psychiatrist since 1997 in this office.  Her last psychiatric admission was at [facility] after having suicidal thoughts.  She denies any history of suicidal attempt however endorse history of hopeless feeling.  She is diagnosed with major depressive disorder. She she was very stable on lithium until her creatinine went up and lithium was discontinued.  We had recently tried Lamictal and the dose has been increased recently.  In the past we had tried BuSpar which was discontinued at hospital.  She has another psychiatric inpatient treatment in 90s. She admitted history of mania, depression and psychosis.  Social history Patient lives in a nursing home.  She has no children.  Her husband died in nursing home.  Patient has no other family.  Medical history Patient was recently admitted due to dehydration and UTI.  Patient has history of arthritis blood pressure and chronic pain. Her primary care physician is Dr. Phillips Odor at [facility].  She takes medication for her blood pressure and arthritis.  She had history of jerking movements, fall and generalized pain. Her last blood work was done on 02/14/2012 which shows anemia, her creatinine was 1.33.  She was given antibiotic result UTI.  Her  WBC count was 10.4.  Family History family history includes Anxiety disorder in her paternal aunt and paternal uncle; Arthritis in an other family member; Asthma in an other family member; Bipolar disorder in her cousin; Diabetes in an other family member. There is no history of Colon cancer.  ROS   Mental status examination Patient is casually dressed and fairly groomed. She is in a wheelchair   She described her mood as ok.Her voice is high-pitched and soft.  .Her affect is less blunted and she is smiling more today  Her attention and concentration are poor today  She expresses herself well .  She doesn't have auditory or visual hallucination.  There were no paranoia or delusion obsession present at this time.  Her fund of knowledge is okay.  There are no tremors and shakes in her hand.  She's alert and oriented x3. Her insight judgment are poor and impulse control is okay.  Lab Results:  Results for orders placed or performed during the hospital encounter of 06/20/14 (from the past 8736 hour(s))  CBC with Differential   Collection Time: 06/20/14  9:43 PM  Result Value Ref Range   WBC 7.7 4.0 - 10.5 K/uL   RBC 3.64 (L) 3.87 - 5.11 MIL/uL   Hemoglobin 10.5 (L) 12.0 - 15.0 g/dL   HCT 16.1 (L) 09.6 - 04.5 %   MCV 96.4 78.0 - 100.0 fL   MCH 28.8 26.0 - 34.0 pg   MCHC 29.9 (L) 30.0 - 36.0 g/dL   RDW 40.9 81.1 - 91.4 %   Platelets 246 150 - 400 K/uL   Neutrophils Relative % 75 43 - 77 %   Neutro Abs 5.8 1.7 - 7.7 K/uL   Lymphocytes Relative 13 12 - 46 %   Lymphs Abs 1.0 0.7 - 4.0 K/uL   Monocytes Relative 9 3 - 12 %   Monocytes Absolute 0.7 0.1 - 1.0 K/uL   Eosinophils Relative 3 0 - 5 %   Eosinophils Absolute 0.2 0.0 - 0.7 K/uL   Basophils Relative 0 0 - 1 %   Basophils Absolute 0.0 0.0 - 0.1 K/uL  Comprehensive metabolic panel   Collection Time: 06/20/14  9:43 PM  Result Value Ref Range   Sodium 140 135 - 145 mmol/L   Potassium 4.2 3.5 - 5.1 mmol/L   Chloride 108 96 - 112 mmol/L    CO2 29 19 - 32 mmol/L   Glucose, Bld 113 (H) 70 - 99 mg/dL   BUN 36 (H) 6 - 23 mg/dL   Creatinine, Ser 7.82 (H) 0.50 - 1.10 mg/dL   Calcium 9.5 8.4 - 95.6 mg/dL   Total Protein 7.4 6.0 - 8.3 g/dL   Albumin 3.6 3.5 - 5.2 g/dL   AST 22 0 - 37 U/L   ALT 17 0 - 35 U/L   Alkaline Phosphatase 113 39 - 117 U/L   Total Bilirubin 0.3 0.3 - 1.2 mg/dL  GFR calc non Af Amer 28 (L) >90 mL/min   GFR calc Af Amer 33 (L) >90 mL/min   Anion gap 3 (L) 5 - 15  Results for orders placed or performed during the hospital encounter of 06/15/14 (from the past 8736 hour(s))  CBC with Differential   Collection Time: 06/15/14  5:35 PM  Result Value Ref Range   WBC 7.2 4.0 - 10.5 K/uL   RBC 3.61 (L) 3.87 - 5.11 MIL/uL   Hemoglobin 10.3 (L) 12.0 - 15.0 g/dL   HCT 44.034.5 (L) 10.236.0 - 72.546.0 %   MCV 95.6 78.0 - 100.0 fL   MCH 28.5 26.0 - 34.0 pg   MCHC 29.9 (L) 30.0 - 36.0 g/dL   RDW 36.615.2 44.011.5 - 34.715.5 %   Platelets 236 150 - 400 K/uL   Neutrophils Relative % 74 43 - 77 %   Neutro Abs 5.3 1.7 - 7.7 K/uL   Lymphocytes Relative 15 12 - 46 %   Lymphs Abs 1.1 0.7 - 4.0 K/uL   Monocytes Relative 9 3 - 12 %   Monocytes Absolute 0.7 0.1 - 1.0 K/uL   Eosinophils Relative 2 0 - 5 %   Eosinophils Absolute 0.2 0.0 - 0.7 K/uL   Basophils Relative 0 0 - 1 %   Basophils Absolute 0.0 0.0 - 0.1 K/uL  I-Stat CG4 Lactic Acid, ED   Collection Time: 06/15/14  5:48 PM  Result Value Ref Range   Lactic Acid, Venous 0.92 0.5 - 2.0 mmol/L  Comprehensive metabolic panel   Collection Time: 06/15/14  6:40 PM  Result Value Ref Range   Sodium 141 135 - 145 mmol/L   Potassium 4.2 3.5 - 5.1 mmol/L   Chloride 107 96 - 112 mmol/L   CO2 28 19 - 32 mmol/L   Glucose, Bld 103 (H) 70 - 99 mg/dL   BUN 36 (H) 6 - 23 mg/dL   Creatinine, Ser 4.251.55 (H) 0.50 - 1.10 mg/dL   Calcium 9.8 8.4 - 95.610.5 mg/dL   Total Protein 7.2 6.0 - 8.3 g/dL   Albumin 3.7 3.5 - 5.2 g/dL   AST 23 0 - 37 U/L   ALT 18 0 - 35 U/L   Alkaline Phosphatase 124 (H) 39 -  117 U/L   Total Bilirubin 0.5 0.3 - 1.2 mg/dL   GFR calc non Af Amer 32 (L) >90 mL/min   GFR calc Af Amer 37 (L) >90 mL/min   Anion gap 6 5 - 15  Results for orders placed or performed during the hospital encounter of 04/28/14 (from the past 8736 hour(s))  Urine culture   Collection Time: 04/28/14  7:25 AM  Result Value Ref Range   Specimen Description URINE, CATHETERIZED    Special Requests NONE    Culture  Setup Time      04/28/2014 14:14 Performed at [facility]    Colony Count      >=100,000 COLONIES/ML Performed at [facility]    Culture      ESCHERICHIA COLI Performed at [facility]    Report Status 04/30/2014 FINAL    Organism ID, Bacteria ESCHERICHIA COLI       Susceptibility   Escherichia coli - MIC*    AMPICILLIN <=2 SENSITIVE Sensitive     CEFAZOLIN <=4 SENSITIVE Sensitive     CEFTRIAXONE <=1 SENSITIVE Sensitive     CIPROFLOXACIN <=0.25 SENSITIVE Sensitive     GENTAMICIN <=1 SENSITIVE Sensitive     LEVOFLOXACIN <=0.12 SENSITIVE Sensitive  NITROFURANTOIN <=16 SENSITIVE Sensitive     TOBRAMYCIN <=1 SENSITIVE Sensitive     TRIMETH/SULFA <=20 SENSITIVE Sensitive     PIP/TAZO <=4 SENSITIVE Sensitive     * ESCHERICHIA COLI  Urinalysis, Routine w reflex microscopic   Collection Time: 04/28/14  7:41 AM  Result Value Ref Range   Color, Urine YELLOW YELLOW   APPearance CLEAR CLEAR   Specific Gravity, Urine 1.015 1.005 - 1.030   pH 6.5 5.0 - 8.0   Glucose, UA NEGATIVE NEGATIVE mg/dL   Hgb urine dipstick TRACE (A) NEGATIVE   Bilirubin Urine NEGATIVE NEGATIVE   Ketones, ur NEGATIVE NEGATIVE mg/dL   Protein, ur NEGATIVE NEGATIVE mg/dL   Urobilinogen, UA 0.2 0.0 - 1.0 mg/dL   Nitrite POSITIVE (A) NEGATIVE   Leukocytes, UA LARGE (A) NEGATIVE  Urine microscopic-add on   Collection Time: 04/28/14  7:41 AM  Result Value Ref Range   WBC, UA TOO NUMEROUS TO COUNT <3 WBC/hpf   RBC / HPF 3-6 <3 RBC/hpf   Bacteria, UA MANY (A) RARE  Basic  metabolic panel   Collection Time: 04/28/14  7:44 AM  Result Value Ref Range   Sodium 142 137 - 147 mEq/L   Potassium 4.5 3.7 - 5.3 mEq/L   Chloride 105 96 - 112 mEq/L   CO2 28 19 - 32 mEq/L   Glucose, Bld 117 (H) 70 - 99 mg/dL   BUN 39 (H) 6 - 23 mg/dL   Creatinine, Ser 4.09 (H) 0.50 - 1.10 mg/dL   Calcium 9.9 8.4 - 81.1 mg/dL   GFR calc non Af Amer 31 (L) >90 mL/min   GFR calc Af Amer 36 (L) >90 mL/min   Anion gap 9 5 - 15  CBC with Differential   Collection Time: 04/28/14  7:44 AM  Result Value Ref Range   WBC 6.5 4.0 - 10.5 K/uL   RBC 3.33 (L) 3.87 - 5.11 MIL/uL   Hemoglobin 9.8 (L) 12.0 - 15.0 g/dL   HCT 91.4 (L) 78.2 - 95.6 %   MCV 95.8 78.0 - 100.0 fL   MCH 29.4 26.0 - 34.0 pg   MCHC 30.7 30.0 - 36.0 g/dL   RDW 21.3 (H) 08.6 - 57.8 %   Platelets 181 150 - 400 K/uL   Neutrophils Relative % 85 (H) 43 - 77 %   Neutro Abs 5.5 1.7 - 7.7 K/uL   Lymphocytes Relative 8 (L) 12 - 46 %   Lymphs Abs 0.5 (L) 0.7 - 4.0 K/uL   Monocytes Relative 5 3 - 12 %   Monocytes Absolute 0.4 0.1 - 1.0 K/uL   Eosinophils Relative 2 0 - 5 %   Eosinophils Absolute 0.1 0.0 - 0.7 K/uL   Basophils Relative 0 0 - 1 %   Basophils Absolute 0.0 0.0 - 0.1 K/uL  Results for orders placed or performed during the hospital encounter of 04/20/14 (from the past 8736 hour(s))  Lactic acid, plasma   Collection Time: 04/20/14  2:23 PM  Result Value Ref Range   Lactic Acid, Venous 1.2 0.5 - 2.2 mmol/L  CBC with Differential   Collection Time: 04/20/14  2:28 PM  Result Value Ref Range   WBC 5.3 4.0 - 10.5 K/uL   RBC 3.23 (L) 3.87 - 5.11 MIL/uL   Hemoglobin 9.5 (L) 12.0 - 15.0 g/dL   HCT 46.9 (L) 62.9 - 52.8 %   MCV 94.4 78.0 - 100.0 fL   MCH 29.4 26.0 - 34.0 pg   MCHC 31.1  30.0 - 36.0 g/dL   RDW 16.1 (H) 09.6 - 04.5 %   Platelets 198 150 - 400 K/uL   Neutrophils Relative % 66 43 - 77 %   Neutro Abs 3.5 1.7 - 7.7 K/uL   Lymphocytes Relative 21 12 - 46 %   Lymphs Abs 1.1 0.7 - 4.0 K/uL   Monocytes  Relative 8 3 - 12 %   Monocytes Absolute 0.4 0.1 - 1.0 K/uL   Eosinophils Relative 5 0 - 5 %   Eosinophils Absolute 0.3 0.0 - 0.7 K/uL   Basophils Relative 0 0 - 1 %   Basophils Absolute 0.0 0.0 - 0.1 K/uL  Basic metabolic panel   Collection Time: 04/20/14  2:28 PM  Result Value Ref Range   Sodium 142 137 - 147 mEq/L   Potassium 5.1 3.7 - 5.3 mEq/L   Chloride 104 96 - 112 mEq/L   CO2 29 19 - 32 mEq/L   Glucose, Bld 112 (H) 70 - 99 mg/dL   BUN 37 (H) 6 - 23 mg/dL   Creatinine, Ser 4.09 (H) 0.50 - 1.10 mg/dL   Calcium 81.1 8.4 - 91.4 mg/dL   GFR calc non Af Amer 28 (L) >90 mL/min   GFR calc Af Amer 32 (L) >90 mL/min   Anion gap 9 5 - 15  Troponin I   Collection Time: 04/20/14  2:28 PM  Result Value Ref Range   Troponin I <0.30 <0.30 ng/mL  Urine culture   Collection Time: 04/20/14  3:38 PM  Result Value Ref Range   Specimen Description URINE, CATHETERIZED    Special Requests NONE    Culture  Setup Time      04/21/2014 00:38 Performed at [facility]    Colony Count NO GROWTH Performed at [facility]     Culture NO GROWTH Performed at [facility]     Report Status 04/21/2014 FINAL   Urinalysis, Routine w reflex microscopic   Collection Time: 04/20/14  3:38 PM  Result Value Ref Range   Color, Urine YELLOW YELLOW   APPearance CLEAR CLEAR   Specific Gravity, Urine 1.020 1.005 - 1.030   pH 5.5 5.0 - 8.0   Glucose, UA NEGATIVE NEGATIVE mg/dL   Hgb urine dipstick NEGATIVE NEGATIVE   Bilirubin Urine NEGATIVE NEGATIVE   Ketones, ur NEGATIVE NEGATIVE mg/dL   Protein, ur NEGATIVE NEGATIVE mg/dL   Urobilinogen, UA 0.2 0.0 - 1.0 mg/dL   Nitrite NEGATIVE NEGATIVE   Leukocytes, UA NEGATIVE NEGATIVE  Results for orders placed or performed during the hospital encounter of 02/14/14 (from the past 8736 hour(s))  Urinalysis, Routine w reflex microscopic   Collection Time: 02/14/14  8:22 PM  Result Value Ref Range   Color, Urine STRAW (A) YELLOW    APPearance CLEAR CLEAR   Specific Gravity, Urine <1.005 (L) 1.005 - 1.030   pH 5.5 5.0 - 8.0   Glucose, UA NEGATIVE NEGATIVE mg/dL   Hgb urine dipstick NEGATIVE NEGATIVE   Bilirubin Urine NEGATIVE NEGATIVE   Ketones, ur NEGATIVE NEGATIVE mg/dL   Protein, ur NEGATIVE NEGATIVE mg/dL   Urobilinogen, UA 0.2 0.0 - 1.0 mg/dL   Nitrite NEGATIVE NEGATIVE   Leukocytes, UA NEGATIVE NEGATIVE  Results for orders placed or performed during the hospital encounter of 11/29/13 (from the past 8736 hour(s))  Basic metabolic panel   Collection Time: 11/29/13 12:36 PM  Result Value Ref Range   Sodium 140 137 - 147 mEq/L   Potassium 4.7 3.7 -  5.3 mEq/L   Chloride 104 96 - 112 mEq/L   CO2 28 19 - 32 mEq/L   Glucose, Bld 94 70 - 99 mg/dL   BUN 32 (H) 6 - 23 mg/dL   Creatinine, Ser 0.86 (H) 0.50 - 1.10 mg/dL   Calcium 9.9 8.4 - 57.8 mg/dL   GFR calc non Af Amer 38 (L) >90 mL/min   GFR calc Af Amer 44 (L) >90 mL/min   Anion gap 8 5 - 15  Protime-INR   Collection Time: 11/29/13 12:36 PM  Result Value Ref Range   Prothrombin Time 13.2 11.6 - 15.2 seconds   INR 1.00 0.00 - 1.49  Results for orders placed or performed during the hospital encounter of 07/18/13 (from the past 8736 hour(s))  CBC   Collection Time: 07/18/13  8:49 AM  Result Value Ref Range   WBC 7.0 4.0 - 10.5 K/uL   RBC 3.89 3.87 - 5.11 MIL/uL   Hemoglobin 11.2 (L) 12.0 - 15.0 g/dL   HCT 46.9 62.9 - 52.8 %   MCV 94.1 78.0 - 100.0 fL   MCH 28.8 26.0 - 34.0 pg   MCHC 30.6 30.0 - 36.0 g/dL   RDW 41.3 24.4 - 01.0 %   Platelets 230 150 - 400 K/uL  Comprehensive metabolic panel   Collection Time: 07/18/13  8:49 AM  Result Value Ref Range   Sodium 141 137 - 147 mEq/L   Potassium 4.5 3.7 - 5.3 mEq/L   Chloride 104 96 - 112 mEq/L   CO2 27 19 - 32 mEq/L   Glucose, Bld 126 (H) 70 - 99 mg/dL   BUN 37 (H) 6 - 23 mg/dL   Creatinine, Ser 2.72 (H) 0.50 - 1.10 mg/dL   Calcium 53.6 8.4 - 64.4 mg/dL   Total Protein 7.2 6.0 - 8.3 g/dL   Albumin  3.3 (L) 3.5 - 5.2 g/dL   AST 71 (H) 0 - 37 U/L   ALT 71 (H) 0 - 35 U/L   Alkaline Phosphatase 127 (H) 39 - 117 U/L   Total Bilirubin 0.2 (L) 0.3 - 1.2 mg/dL   GFR calc non Af Amer 30 (L) >90 mL/min   GFR calc Af Amer 35 (L) >90 mL/min  Troponin I   Collection Time: 07/18/13  8:49 AM  Result Value Ref Range   Troponin I <0.30 <0.30 ng/mL  CBG monitoring, ED   Collection Time: 07/18/13  8:58 AM  Result Value Ref Range   Glucose-Capillary 118 (H) 70 - 99 mg/dL  Urine culture   Collection Time: 07/18/13  9:12 AM  Result Value Ref Range   Specimen Description URINE, CATHETERIZED    Special Requests NONE    Culture  Setup Time      07/18/2013 23:14 Performed at [facility] Count      >=100,000 COLONIES/ML Performed at [facility]   Culture      VIRIDANS STREPTOCOCCUS Performed at [facility]   Report Status 07/19/2013 FINAL   Urinalysis, Routine w reflex microscopic   Collection Time: 07/18/13  9:12 AM  Result Value Ref Range   Color, Urine YELLOW YELLOW   APPearance CLOUDY (A) CLEAR   Specific Gravity, Urine 1.010 1.005 - 1.030   pH 6.5 5.0 - 8.0   Glucose, UA NEGATIVE NEGATIVE mg/dL   Hgb urine dipstick NEGATIVE NEGATIVE   Bilirubin Urine NEGATIVE NEGATIVE   Ketones, ur NEGATIVE NEGATIVE mg/dL   Protein, ur NEGATIVE NEGATIVE mg/dL  Urobilinogen, UA 0.2 0.0 - 1.0 mg/dL   Nitrite NEGATIVE NEGATIVE   Leukocytes, UA NEGATIVE NEGATIVE   she had labs done last week with her primary care physician we will get the result Diagnoses 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 We'll continue all medications  Discussed the risk and benefits of the medication in detail.    Time spent 15 minutes.  More  than 50% of the time spent and psychoeducation, counseling and coordination of care. She'll return in 3 months  MEDICATIONS this encounter: Meds ordered this encounter  Medications  . traMADol (ULTRAM) 50 MG tablet    Sig: Take 50 mg by mouth as needed.  . risperiDONE (RISPERDAL) 1 MG tablet    Sig: Take 1 tablet (1 mg total) by mouth at bedtime.    Dispense:  30 tablet    Refill:  2  . benztropine (COGENTIN) 0.5 MG tablet    Sig: Take 1 tablet (0.5 mg total) by mouth 2 (two) times daily.    Dispense:  60 tablet    Refill:  2  . lamoTRIgine (LAMICTAL) 100 MG tablet    Sig: Take 1 tablet (100 mg total) by mouth 2 (two) times daily.    Dispense:  60 tablet    Refill:  2  . buPROPion (WELLBUTRIN XL) 300 MG 24 hr tablet    Sig: Take 1 tablet (300 mg total) by mouth every morning.    Dispense:  30 tablet    Refill:  2  . PARoxetine (PAXIL) 40 MG tablet    Sig: Take 1 tablet (40 mg total) by mouth at bedtime.    Dispense:  30 tablet    Refill:  2  . risperiDONE (RISPERDAL) 0.25 MG tablet    Sig: Take 1 tablet (0.25 mg total) by mouth 3 (three) times daily. Takes at 8:00AM, 2:00PM, 5:00PM.    Dispense:  90 tablet    Refill:  2  . clonazePAM (KLONOPIN) 0.5 MG tablet    Sig: Take 1 tablet (0.5 mg total) by mouth 2 (two) times daily as needed for anxiety.    Dispense:  60 tablet    Refill:  2    Medical Decision Making Problem Points:  Established problem, stable/improving (1), Established problem, worsening (2), New problem, with additional work-up planned (4), Review of last therapy session (1) and Review of psycho-social stressors (1) Data Points:  Review of medication regiment & side effects (2) Review of new medications or change in dosage (2)  I certify that outpatient services furnished can reasonably be expected to improve the patient's condition.   Diannia Ruder, MD

## 2014-07-08 DIAGNOSIS — N183 Chronic kidney disease, stage 3 (moderate): Secondary | ICD-10-CM | POA: Diagnosis not present

## 2014-07-08 DIAGNOSIS — M1990 Unspecified osteoarthritis, unspecified site: Secondary | ICD-10-CM | POA: Diagnosis not present

## 2014-07-08 DIAGNOSIS — S81802D Unspecified open wound, left lower leg, subsequent encounter: Secondary | ICD-10-CM | POA: Diagnosis not present

## 2014-07-08 DIAGNOSIS — M6281 Muscle weakness (generalized): Secondary | ICD-10-CM | POA: Diagnosis not present

## 2014-07-08 DIAGNOSIS — I129 Hypertensive chronic kidney disease with stage 1 through stage 4 chronic kidney disease, or unspecified chronic kidney disease: Secondary | ICD-10-CM | POA: Diagnosis not present

## 2014-07-11 DIAGNOSIS — M6281 Muscle weakness (generalized): Secondary | ICD-10-CM | POA: Diagnosis not present

## 2014-07-11 DIAGNOSIS — S81802D Unspecified open wound, left lower leg, subsequent encounter: Secondary | ICD-10-CM | POA: Diagnosis not present

## 2014-07-11 DIAGNOSIS — N183 Chronic kidney disease, stage 3 (moderate): Secondary | ICD-10-CM | POA: Diagnosis not present

## 2014-07-11 DIAGNOSIS — M1990 Unspecified osteoarthritis, unspecified site: Secondary | ICD-10-CM | POA: Diagnosis not present

## 2014-07-11 DIAGNOSIS — I129 Hypertensive chronic kidney disease with stage 1 through stage 4 chronic kidney disease, or unspecified chronic kidney disease: Secondary | ICD-10-CM | POA: Diagnosis not present

## 2014-07-14 DIAGNOSIS — E782 Mixed hyperlipidemia: Secondary | ICD-10-CM | POA: Diagnosis not present

## 2014-07-14 DIAGNOSIS — Z6839 Body mass index (BMI) 39.0-39.9, adult: Secondary | ICD-10-CM | POA: Diagnosis not present

## 2014-07-14 DIAGNOSIS — R7301 Impaired fasting glucose: Secondary | ICD-10-CM | POA: Diagnosis not present

## 2014-07-14 DIAGNOSIS — Z0000 Encounter for general adult medical examination without abnormal findings: Secondary | ICD-10-CM | POA: Diagnosis not present

## 2014-07-14 DIAGNOSIS — I10 Essential (primary) hypertension: Secondary | ICD-10-CM | POA: Diagnosis not present

## 2014-07-14 DIAGNOSIS — Z1389 Encounter for screening for other disorder: Secondary | ICD-10-CM | POA: Diagnosis not present

## 2014-07-15 DIAGNOSIS — M1990 Unspecified osteoarthritis, unspecified site: Secondary | ICD-10-CM | POA: Diagnosis not present

## 2014-07-15 DIAGNOSIS — M6281 Muscle weakness (generalized): Secondary | ICD-10-CM | POA: Diagnosis not present

## 2014-07-15 DIAGNOSIS — N183 Chronic kidney disease, stage 3 (moderate): Secondary | ICD-10-CM | POA: Diagnosis not present

## 2014-07-15 DIAGNOSIS — I129 Hypertensive chronic kidney disease with stage 1 through stage 4 chronic kidney disease, or unspecified chronic kidney disease: Secondary | ICD-10-CM | POA: Diagnosis not present

## 2014-07-15 DIAGNOSIS — S81802D Unspecified open wound, left lower leg, subsequent encounter: Secondary | ICD-10-CM | POA: Diagnosis not present

## 2014-07-26 DIAGNOSIS — R32 Unspecified urinary incontinence: Secondary | ICD-10-CM | POA: Diagnosis not present

## 2014-07-26 DIAGNOSIS — I10 Essential (primary) hypertension: Secondary | ICD-10-CM | POA: Diagnosis not present

## 2014-07-28 DIAGNOSIS — Z0000 Encounter for general adult medical examination without abnormal findings: Secondary | ICD-10-CM | POA: Diagnosis not present

## 2014-07-28 DIAGNOSIS — Z23 Encounter for immunization: Secondary | ICD-10-CM | POA: Diagnosis not present

## 2014-08-04 ENCOUNTER — Other Ambulatory Visit (HOSPITAL_COMMUNITY): Payer: Self-pay | Admitting: Psychiatry

## 2014-08-15 DIAGNOSIS — R69 Illness, unspecified: Secondary | ICD-10-CM | POA: Diagnosis not present

## 2014-08-18 ENCOUNTER — Inpatient Hospital Stay (HOSPITAL_COMMUNITY)
Admission: EM | Admit: 2014-08-18 | Discharge: 2014-08-22 | DRG: 640 | Disposition: A | Discharge status: Skilled Nursing Facility | Payer: Medicare Other | Attending: Internal Medicine | Admitting: Internal Medicine

## 2014-08-18 ENCOUNTER — Encounter (HOSPITAL_COMMUNITY): Payer: Self-pay | Admitting: Emergency Medicine

## 2014-08-18 DIAGNOSIS — Z66 Do not resuscitate: Secondary | ICD-10-CM | POA: Diagnosis present

## 2014-08-18 DIAGNOSIS — R410 Disorientation, unspecified: Secondary | ICD-10-CM | POA: Diagnosis present

## 2014-08-18 DIAGNOSIS — E162 Hypoglycemia, unspecified: Secondary | ICD-10-CM | POA: Diagnosis not present

## 2014-08-18 DIAGNOSIS — N189 Chronic kidney disease, unspecified: Secondary | ICD-10-CM

## 2014-08-18 DIAGNOSIS — F329 Major depressive disorder, single episode, unspecified: Secondary | ICD-10-CM | POA: Diagnosis present

## 2014-08-18 DIAGNOSIS — Z825 Family history of asthma and other chronic lower respiratory diseases: Secondary | ICD-10-CM | POA: Diagnosis not present

## 2014-08-18 DIAGNOSIS — M6281 Muscle weakness (generalized): Secondary | ICD-10-CM | POA: Diagnosis not present

## 2014-08-18 DIAGNOSIS — R4182 Altered mental status, unspecified: Secondary | ICD-10-CM | POA: Diagnosis not present

## 2014-08-18 DIAGNOSIS — R680 Hypothermia, not associated with low environmental temperature: Secondary | ICD-10-CM | POA: Diagnosis not present

## 2014-08-18 DIAGNOSIS — N183 Chronic kidney disease, stage 3 unspecified: Secondary | ICD-10-CM | POA: Diagnosis present

## 2014-08-18 DIAGNOSIS — I10 Essential (primary) hypertension: Secondary | ICD-10-CM | POA: Diagnosis not present

## 2014-08-18 DIAGNOSIS — Z833 Family history of diabetes mellitus: Secondary | ICD-10-CM | POA: Diagnosis not present

## 2014-08-18 DIAGNOSIS — D649 Anemia, unspecified: Secondary | ICD-10-CM

## 2014-08-18 DIAGNOSIS — Z6841 Body Mass Index (BMI) 40.0 and over, adult: Secondary | ICD-10-CM | POA: Diagnosis not present

## 2014-08-18 DIAGNOSIS — S301XXA Contusion of abdominal wall, initial encounter: Secondary | ICD-10-CM | POA: Diagnosis not present

## 2014-08-18 DIAGNOSIS — N184 Chronic kidney disease, stage 4 (severe): Secondary | ICD-10-CM | POA: Diagnosis present

## 2014-08-18 DIAGNOSIS — F319 Bipolar disorder, unspecified: Secondary | ICD-10-CM | POA: Diagnosis present

## 2014-08-18 DIAGNOSIS — N179 Acute kidney failure, unspecified: Secondary | ICD-10-CM | POA: Diagnosis present

## 2014-08-18 DIAGNOSIS — G9341 Metabolic encephalopathy: Secondary | ICD-10-CM | POA: Diagnosis present

## 2014-08-18 DIAGNOSIS — I129 Hypertensive chronic kidney disease with stage 1 through stage 4 chronic kidney disease, or unspecified chronic kidney disease: Secondary | ICD-10-CM | POA: Diagnosis present

## 2014-08-18 DIAGNOSIS — R1311 Dysphagia, oral phase: Secondary | ICD-10-CM | POA: Diagnosis not present

## 2014-08-18 DIAGNOSIS — T68XXXA Hypothermia, initial encounter: Secondary | ICD-10-CM | POA: Diagnosis present

## 2014-08-18 DIAGNOSIS — E785 Hyperlipidemia, unspecified: Secondary | ICD-10-CM | POA: Diagnosis present

## 2014-08-18 DIAGNOSIS — N289 Disorder of kidney and ureter, unspecified: Secondary | ICD-10-CM

## 2014-08-18 DIAGNOSIS — T43205A Adverse effect of unspecified antidepressants, initial encounter: Secondary | ICD-10-CM | POA: Diagnosis present

## 2014-08-18 DIAGNOSIS — D696 Thrombocytopenia, unspecified: Secondary | ICD-10-CM | POA: Diagnosis present

## 2014-08-18 DIAGNOSIS — R0600 Dyspnea, unspecified: Secondary | ICD-10-CM | POA: Diagnosis not present

## 2014-08-18 DIAGNOSIS — F419 Anxiety disorder, unspecified: Secondary | ICD-10-CM | POA: Diagnosis not present

## 2014-08-18 DIAGNOSIS — R600 Localized edema: Secondary | ICD-10-CM | POA: Diagnosis not present

## 2014-08-18 DIAGNOSIS — E875 Hyperkalemia: Secondary | ICD-10-CM | POA: Diagnosis present

## 2014-08-18 DIAGNOSIS — R279 Unspecified lack of coordination: Secondary | ICD-10-CM | POA: Diagnosis not present

## 2014-08-18 DIAGNOSIS — F339 Major depressive disorder, recurrent, unspecified: Secondary | ICD-10-CM | POA: Diagnosis not present

## 2014-08-18 DIAGNOSIS — R41841 Cognitive communication deficit: Secondary | ICD-10-CM | POA: Diagnosis not present

## 2014-08-18 DIAGNOSIS — S3011XA Contusion of abdominal wall, initial encounter: Secondary | ICD-10-CM | POA: Diagnosis present

## 2014-08-18 DIAGNOSIS — IMO0001 Reserved for inherently not codable concepts without codable children: Secondary | ICD-10-CM

## 2014-08-18 DIAGNOSIS — R2681 Unsteadiness on feet: Secondary | ICD-10-CM | POA: Diagnosis not present

## 2014-08-18 DIAGNOSIS — F0280 Dementia in other diseases classified elsewhere without behavioral disturbance: Secondary | ICD-10-CM | POA: Diagnosis not present

## 2014-08-18 DIAGNOSIS — F0390 Unspecified dementia without behavioral disturbance: Secondary | ICD-10-CM | POA: Diagnosis present

## 2014-08-18 DIAGNOSIS — M1990 Unspecified osteoarthritis, unspecified site: Secondary | ICD-10-CM | POA: Diagnosis present

## 2014-08-18 DIAGNOSIS — R262 Difficulty in walking, not elsewhere classified: Secondary | ICD-10-CM | POA: Diagnosis not present

## 2014-08-18 DIAGNOSIS — R278 Other lack of coordination: Secondary | ICD-10-CM | POA: Diagnosis not present

## 2014-08-18 DIAGNOSIS — W19XXXA Unspecified fall, initial encounter: Secondary | ICD-10-CM | POA: Diagnosis present

## 2014-08-18 DIAGNOSIS — K648 Other hemorrhoids: Secondary | ICD-10-CM | POA: Diagnosis not present

## 2014-08-18 DIAGNOSIS — F418 Other specified anxiety disorders: Secondary | ICD-10-CM | POA: Diagnosis present

## 2014-08-18 DIAGNOSIS — Z7401 Bed confinement status: Secondary | ICD-10-CM | POA: Diagnosis not present

## 2014-08-18 DIAGNOSIS — Z741 Need for assistance with personal care: Secondary | ICD-10-CM | POA: Diagnosis not present

## 2014-08-18 DIAGNOSIS — E161 Other hypoglycemia: Secondary | ICD-10-CM | POA: Diagnosis not present

## 2014-08-18 NOTE — ED Notes (Signed)
Pt has been talking weird per nursing facility x 3 days. Pt is from [facility] and on arrival to facility ems states pt blood sugar was 59

## 2014-08-19 ENCOUNTER — Emergency Department (HOSPITAL_COMMUNITY): Payer: Medicare Other

## 2014-08-19 ENCOUNTER — Inpatient Hospital Stay (HOSPITAL_COMMUNITY): Payer: Medicare Other

## 2014-08-19 DIAGNOSIS — T43205A Adverse effect of unspecified antidepressants, initial encounter: Secondary | ICD-10-CM | POA: Diagnosis present

## 2014-08-19 DIAGNOSIS — M1990 Unspecified osteoarthritis, unspecified site: Secondary | ICD-10-CM | POA: Diagnosis present

## 2014-08-19 DIAGNOSIS — R600 Localized edema: Secondary | ICD-10-CM | POA: Diagnosis not present

## 2014-08-19 DIAGNOSIS — E785 Hyperlipidemia, unspecified: Secondary | ICD-10-CM | POA: Diagnosis present

## 2014-08-19 DIAGNOSIS — E162 Hypoglycemia, unspecified: Secondary | ICD-10-CM | POA: Diagnosis present

## 2014-08-19 DIAGNOSIS — N179 Acute kidney failure, unspecified: Secondary | ICD-10-CM | POA: Diagnosis not present

## 2014-08-19 DIAGNOSIS — Z7401 Bed confinement status: Secondary | ICD-10-CM | POA: Diagnosis not present

## 2014-08-19 DIAGNOSIS — R410 Disorientation, unspecified: Secondary | ICD-10-CM | POA: Diagnosis not present

## 2014-08-19 DIAGNOSIS — F0280 Dementia in other diseases classified elsewhere without behavioral disturbance: Secondary | ICD-10-CM | POA: Diagnosis not present

## 2014-08-19 DIAGNOSIS — T68XXXA Hypothermia, initial encounter: Secondary | ICD-10-CM | POA: Diagnosis present

## 2014-08-19 DIAGNOSIS — I10 Essential (primary) hypertension: Secondary | ICD-10-CM

## 2014-08-19 DIAGNOSIS — N183 Chronic kidney disease, stage 3 unspecified: Secondary | ICD-10-CM | POA: Diagnosis present

## 2014-08-19 DIAGNOSIS — R278 Other lack of coordination: Secondary | ICD-10-CM | POA: Diagnosis not present

## 2014-08-19 DIAGNOSIS — R279 Unspecified lack of coordination: Secondary | ICD-10-CM | POA: Diagnosis not present

## 2014-08-19 DIAGNOSIS — R2681 Unsteadiness on feet: Secondary | ICD-10-CM | POA: Diagnosis not present

## 2014-08-19 DIAGNOSIS — Z66 Do not resuscitate: Secondary | ICD-10-CM | POA: Diagnosis present

## 2014-08-19 DIAGNOSIS — R680 Hypothermia, not associated with low environmental temperature: Secondary | ICD-10-CM | POA: Diagnosis not present

## 2014-08-19 DIAGNOSIS — R41841 Cognitive communication deficit: Secondary | ICD-10-CM | POA: Diagnosis not present

## 2014-08-19 DIAGNOSIS — M6281 Muscle weakness (generalized): Secondary | ICD-10-CM | POA: Diagnosis not present

## 2014-08-19 DIAGNOSIS — N189 Chronic kidney disease, unspecified: Secondary | ICD-10-CM

## 2014-08-19 DIAGNOSIS — F419 Anxiety disorder, unspecified: Secondary | ICD-10-CM | POA: Diagnosis not present

## 2014-08-19 DIAGNOSIS — F319 Bipolar disorder, unspecified: Secondary | ICD-10-CM | POA: Diagnosis present

## 2014-08-19 DIAGNOSIS — Z825 Family history of asthma and other chronic lower respiratory diseases: Secondary | ICD-10-CM | POA: Diagnosis not present

## 2014-08-19 DIAGNOSIS — R4182 Altered mental status, unspecified: Secondary | ICD-10-CM | POA: Diagnosis not present

## 2014-08-19 DIAGNOSIS — F339 Major depressive disorder, recurrent, unspecified: Secondary | ICD-10-CM | POA: Diagnosis not present

## 2014-08-19 DIAGNOSIS — D696 Thrombocytopenia, unspecified: Secondary | ICD-10-CM | POA: Diagnosis present

## 2014-08-19 DIAGNOSIS — F0390 Unspecified dementia without behavioral disturbance: Secondary | ICD-10-CM | POA: Diagnosis not present

## 2014-08-19 DIAGNOSIS — N184 Chronic kidney disease, stage 4 (severe): Secondary | ICD-10-CM | POA: Diagnosis present

## 2014-08-19 DIAGNOSIS — S301XXA Contusion of abdominal wall, initial encounter: Secondary | ICD-10-CM | POA: Diagnosis not present

## 2014-08-19 DIAGNOSIS — R1311 Dysphagia, oral phase: Secondary | ICD-10-CM | POA: Diagnosis not present

## 2014-08-19 DIAGNOSIS — R0600 Dyspnea, unspecified: Secondary | ICD-10-CM

## 2014-08-19 DIAGNOSIS — I129 Hypertensive chronic kidney disease with stage 1 through stage 4 chronic kidney disease, or unspecified chronic kidney disease: Secondary | ICD-10-CM | POA: Diagnosis present

## 2014-08-19 DIAGNOSIS — K648 Other hemorrhoids: Secondary | ICD-10-CM | POA: Diagnosis not present

## 2014-08-19 DIAGNOSIS — D649 Anemia, unspecified: Secondary | ICD-10-CM | POA: Diagnosis present

## 2014-08-19 DIAGNOSIS — Z833 Family history of diabetes mellitus: Secondary | ICD-10-CM | POA: Diagnosis not present

## 2014-08-19 DIAGNOSIS — E875 Hyperkalemia: Secondary | ICD-10-CM | POA: Diagnosis present

## 2014-08-19 DIAGNOSIS — W19XXXA Unspecified fall, initial encounter: Secondary | ICD-10-CM | POA: Diagnosis present

## 2014-08-19 DIAGNOSIS — R262 Difficulty in walking, not elsewhere classified: Secondary | ICD-10-CM | POA: Diagnosis not present

## 2014-08-19 DIAGNOSIS — Z741 Need for assistance with personal care: Secondary | ICD-10-CM | POA: Diagnosis not present

## 2014-08-19 DIAGNOSIS — Z6841 Body Mass Index (BMI) 40.0 and over, adult: Secondary | ICD-10-CM | POA: Diagnosis not present

## 2014-08-19 DIAGNOSIS — F331 Major depressive disorder, recurrent, moderate: Secondary | ICD-10-CM

## 2014-08-19 DIAGNOSIS — G9341 Metabolic encephalopathy: Secondary | ICD-10-CM | POA: Diagnosis present

## 2014-08-19 DIAGNOSIS — F418 Other specified anxiety disorders: Secondary | ICD-10-CM | POA: Diagnosis present

## 2014-08-19 LAB — COMPREHENSIVE METABOLIC PANEL
ALK PHOS: 132 U/L — AB (ref 39–117)
ALT: 54 U/L — ABNORMAL HIGH (ref 0–35)
ALT: 54 U/L — ABNORMAL HIGH (ref 0–35)
AST: 44 U/L — AB (ref 0–37)
AST: 45 U/L — ABNORMAL HIGH (ref 0–37)
Albumin: 3.2 g/dL — ABNORMAL LOW (ref 3.5–5.2)
Albumin: 3.1 g/dL — ABNORMAL LOW (ref 3.5–5.2)
Alkaline Phosphatase: 134 U/L — ABNORMAL HIGH (ref 39–117)
Anion gap: 5 (ref 5–15)
Anion gap: 6 (ref 5–15)
BILIRUBIN TOTAL: 0.3 mg/dL (ref 0.3–1.2)
BILIRUBIN TOTAL: 0.3 mg/dL (ref 0.3–1.2)
BUN: 55 mg/dL — ABNORMAL HIGH (ref 6–23)
BUN: 54 mg/dL — ABNORMAL HIGH (ref 6–23)
CALCIUM: 9.6 mg/dL (ref 8.4–10.5)
CHLORIDE: 107 mmol/L (ref 96–112)
CO2: 27 mmol/L (ref 19–32)
CO2: 28 mmol/L (ref 19–32)
Calcium: 9.8 mg/dL (ref 8.4–10.5)
Chloride: 110 mmol/L (ref 96–112)
Creatinine, Ser: 1.64 mg/dL — ABNORMAL HIGH (ref 0.50–1.10)
Creatinine, Ser: 1.53 mg/dL — ABNORMAL HIGH (ref 0.50–1.10)
GFR calc non Af Amer: 32 mL/min — ABNORMAL LOW (ref >90)
GFR, EST AFRICAN AMERICAN: 34 mL/min — AB (ref >90)
GFR, EST AFRICAN AMERICAN: 37 mL/min — AB (ref >90)
GFR, EST NON AFRICAN AMERICAN: 30 mL/min — AB (ref >90)
GLUCOSE: 72 mg/dL (ref 70–99)
GLUCOSE: 89 mg/dL (ref 70–99)
POTASSIUM: 5.8 mmol/L — AB (ref 3.5–5.1)
Potassium: 5.9 mmol/L — ABNORMAL HIGH (ref 3.5–5.1)
Sodium: 140 mmol/L (ref 135–145)
Sodium: 143 mmol/L (ref 135–145)
Total Protein: 6.4 g/dL (ref 6.0–8.3)
Total Protein: 6.3 g/dL (ref 6.0–8.3)

## 2014-08-19 LAB — CBG MONITORING, ED
GLUCOSE-CAPILLARY: 79 mg/dL (ref 70–99)
Glucose-Capillary: 62 mg/dL — ABNORMAL LOW (ref 70–99)
Glucose-Capillary: 82 mg/dL (ref 70–99)

## 2014-08-19 LAB — GLUCOSE, CAPILLARY
Glucose-Capillary: 64 mg/dL — ABNORMAL LOW (ref 70–99)
Glucose-Capillary: 75 mg/dL (ref 70–99)
Glucose-Capillary: 87 mg/dL (ref 70–99)
Glucose-Capillary: 77 mg/dL (ref 70–99)

## 2014-08-19 LAB — URINALYSIS, ROUTINE W REFLEX MICROSCOPIC
Bilirubin Urine: NEGATIVE
GLUCOSE, UA: NEGATIVE mg/dL
Hgb urine dipstick: NEGATIVE
Ketones, ur: NEGATIVE mg/dL
LEUKOCYTES UA: NEGATIVE
Nitrite: NEGATIVE
Protein, ur: NEGATIVE mg/dL
Specific Gravity, Urine: 1.025 (ref 1.005–1.030)
Urobilinogen, UA: 0.2 mg/dL (ref 0.0–1.0)
pH: 5.5 (ref 5.0–8.0)

## 2014-08-19 LAB — CBC WITH DIFFERENTIAL/PLATELET
BASOS ABS: 0 10*3/uL (ref 0.0–0.1)
BASOS PCT: 0 % (ref 0–1)
EOS PCT: 4 % (ref 0–5)
Eosinophils Absolute: 0.2 10*3/uL (ref 0.0–0.7)
HEMATOCRIT: 30.1 % — AB (ref 36.0–46.0)
HEMOGLOBIN: 9.1 g/dL — AB (ref 12.0–15.0)
Lymphocytes Relative: 19 % (ref 12–46)
Lymphs Abs: 0.8 10*3/uL (ref 0.7–4.0)
MCH: 29.6 pg (ref 26.0–34.0)
MCHC: 30.2 g/dL (ref 30.0–36.0)
MCV: 98 fL (ref 78.0–100.0)
Monocytes Absolute: 0.3 10*3/uL (ref 0.1–1.0)
Monocytes Relative: 7 % (ref 3–12)
NEUTROS ABS: 3.1 10*3/uL (ref 1.7–7.7)
Neutrophils Relative %: 70 % (ref 43–77)
PLATELETS: 132 10*3/uL — AB (ref 150–400)
RBC: 3.07 MIL/uL — ABNORMAL LOW (ref 3.87–5.11)
RDW: 16.9 % — AB (ref 11.5–15.5)
WBC: 4.5 10*3/uL (ref 4.0–10.5)

## 2014-08-19 LAB — CBC
HEMATOCRIT: 29.8 % — AB (ref 36.0–46.0)
Hemoglobin: 8.9 g/dL — ABNORMAL LOW (ref 12.0–15.0)
MCH: 29.1 pg (ref 26.0–34.0)
MCHC: 29.9 g/dL — ABNORMAL LOW (ref 30.0–36.0)
MCV: 97.4 fL (ref 78.0–100.0)
PLATELETS: 143 10*3/uL — AB (ref 150–400)
RBC: 3.06 MIL/uL — ABNORMAL LOW (ref 3.87–5.11)
RDW: 17 % — AB (ref 11.5–15.5)
WBC: 4 10*3/uL (ref 4.0–10.5)

## 2014-08-19 LAB — MRSA PCR SCREENING: MRSA by PCR: NEGATIVE

## 2014-08-19 LAB — POTASSIUM: POTASSIUM: 5.4 mmol/L — AB (ref 3.5–5.1)

## 2014-08-19 LAB — I-STAT CG4 LACTIC ACID, ED: LACTIC ACID, VENOUS: 0.78 mmol/L (ref 0.5–2.0)

## 2014-08-19 MED ORDER — SODIUM POLYSTYRENE SULFONATE 15 GM/60ML PO SUSP
15.0000 g | Freq: Once | ORAL | Status: AC
  Administered 2014-08-19: 15 g via ORAL
  Filled 2014-08-19: qty 60

## 2014-08-19 MED ORDER — DARIFENACIN HYDROBROMIDE ER 7.5 MG PO TB24
15.0000 mg | ORAL_TABLET | Freq: Every day | ORAL | Status: DC
  Administered 2014-08-19 – 2014-08-22 (×4): 15 mg via ORAL
  Filled 2014-08-19 (×4): qty 2

## 2014-08-19 MED ORDER — ACETAMINOPHEN 650 MG RE SUPP: 650.0000 mg | Freq: Four times a day (QID) | RECTAL | Status: DC | PRN

## 2014-08-19 MED ORDER — PAROXETINE HCL 20 MG PO TABS
40.0000 mg | ORAL_TABLET | Freq: Every day | ORAL | Status: DC
  Administered 2014-08-19 – 2014-08-21 (×3): 40 mg via ORAL
  Filled 2014-08-19 (×3): qty 2

## 2014-08-19 MED ORDER — ACETAMINOPHEN 325 MG PO TABS
650.0000 mg | ORAL_TABLET | Freq: Four times a day (QID) | ORAL | Status: DC | PRN
Start: 2014-08-19 — End: 2014-08-22
  Administered 2014-08-19 – 2014-08-20 (×2): 650 mg via ORAL
  Filled 2014-08-19 (×3): qty 2

## 2014-08-19 MED ORDER — SODIUM CHLORIDE 0.9 % IJ SOLN
INTRAMUSCULAR | Status: AC
  Filled 2014-08-19: qty 500

## 2014-08-19 MED ORDER — CETYLPYRIDINIUM CHLORIDE 0.05 % MT LIQD
7.0000 mL | Freq: Two times a day (BID) | OROMUCOSAL | Status: DC
  Administered 2014-08-19 – 2014-08-22 (×7): 7 mL via OROMUCOSAL

## 2014-08-19 MED ORDER — LAMOTRIGINE 100 MG PO TABS
100.0000 mg | ORAL_TABLET | Freq: Two times a day (BID) | ORAL | Status: DC
  Administered 2014-08-19 – 2014-08-22 (×7): 100 mg via ORAL
  Filled 2014-08-19 (×12): qty 1

## 2014-08-19 MED ORDER — ASPIRIN EC 81 MG PO TBEC
81.0000 mg | DELAYED_RELEASE_TABLET | Freq: Every day | ORAL | Status: DC
  Administered 2014-08-19 – 2014-08-22 (×4): 81 mg via ORAL
  Filled 2014-08-19 (×4): qty 1

## 2014-08-19 MED ORDER — ONDANSETRON HCL 4 MG PO TABS: 4.0000 mg | ORAL_TABLET | Freq: Four times a day (QID) | ORAL | Status: DC | PRN

## 2014-08-19 MED ORDER — ONDANSETRON HCL 4 MG/2ML IJ SOLN: 4.0000 mg | Freq: Four times a day (QID) | INTRAMUSCULAR | Status: DC | PRN

## 2014-08-19 MED ORDER — PANTOPRAZOLE SODIUM 40 MG PO TBEC
40.0000 mg | DELAYED_RELEASE_TABLET | Freq: Every day | ORAL | Status: DC
  Administered 2014-08-19 – 2014-08-22 (×4): 40 mg via ORAL
  Filled 2014-08-19 (×4): qty 1

## 2014-08-19 MED ORDER — POLYETHYLENE GLYCOL 3350 17 G PO PACK: 17.0000 g | PACK | Freq: Every day | ORAL | Status: DC | PRN

## 2014-08-19 MED ORDER — FUROSEMIDE 10 MG/ML IJ SOLN
20.0000 mg | Freq: Two times a day (BID) | INTRAMUSCULAR | Status: DC
  Administered 2014-08-19 – 2014-08-20 (×3): 20 mg via INTRAVENOUS
  Filled 2014-08-19 (×3): qty 2

## 2014-08-19 MED ORDER — RISPERIDONE 0.5 MG PO TABS
0.2500 mg | ORAL_TABLET | Freq: Three times a day (TID) | ORAL | Status: DC
  Administered 2014-08-19 – 2014-08-22 (×11): 0.25 mg via ORAL
  Filled 2014-08-19 (×11): qty 1

## 2014-08-19 MED ORDER — HEPARIN SODIUM (PORCINE) 5000 UNIT/ML IJ SOLN
5000.0000 [IU] | Freq: Three times a day (TID) | INTRAMUSCULAR | Status: DC
  Administered 2014-08-19 – 2014-08-20 (×4): 5000 [IU] via SUBCUTANEOUS
  Filled 2014-08-19 (×4): qty 1

## 2014-08-19 MED ORDER — DEXTROSE-NACL 5-0.9 % IV SOLN
INTRAVENOUS | Status: DC
  Administered 2014-08-19: 04:00:00 via INTRAVENOUS

## 2014-08-19 MED ORDER — RISPERIDONE 1 MG PO TABS
1.0000 mg | ORAL_TABLET | Freq: Every day | ORAL | Status: DC
  Administered 2014-08-19 – 2014-08-21 (×3): 1 mg via ORAL
  Filled 2014-08-19 (×3): qty 1

## 2014-08-19 MED ORDER — BENZTROPINE MESYLATE 1 MG PO TABS
0.5000 mg | ORAL_TABLET | Freq: Two times a day (BID) | ORAL | Status: DC
  Administered 2014-08-19 – 2014-08-22 (×7): 0.5 mg via ORAL
  Filled 2014-08-19 (×7): qty 1

## 2014-08-19 MED ORDER — SODIUM POLYSTYRENE SULFONATE 15 GM/60ML PO SUSP
30.0000 g | Freq: Once | ORAL | Status: AC
  Administered 2014-08-19: 30 g via ORAL
  Filled 2014-08-19: qty 120

## 2014-08-19 MED ORDER — SODIUM CHLORIDE 0.9 % IV SOLN: INTRAVENOUS | Status: DC

## 2014-08-19 MED ORDER — BIOTIN 5000 MCG PO CAPS: 1.0000 | ORAL_CAPSULE | Freq: Every morning | ORAL | Status: DC

## 2014-08-19 MED ORDER — IOHEXOL 300 MG/ML  SOLN: 50.0000 mL | Freq: Once | INTRAMUSCULAR | Status: AC | PRN

## 2014-08-19 MED ORDER — BUPROPION HCL ER (XL) 300 MG PO TB24
300.0000 mg | ORAL_TABLET | Freq: Every day | ORAL | Status: DC
  Administered 2014-08-19 – 2014-08-22 (×4): 300 mg via ORAL
  Filled 2014-08-19 (×6): qty 1

## 2014-08-19 NOTE — Progress Notes (Signed)
  Echocardiogram 2D Echocardiogram has been performed.  Stacey DrainWhite, Kariyah Baugh J 08/19/2014, 1:43 PM

## 2014-08-19 NOTE — Care Management Note (Addendum)
    Page 1 of 1   08/22/2014     2:11:16 PM CARE MANAGEMENT NOTE 08/22/2014  Patient:  Amy Clay,Amy Clay   Account Number:  [id number]  Date Initiated:  08/19/2014  Documentation initiated by:  Kathyrn SheriffHILDRESS,JESSICA  Subjective/Objective Assessment:   [facility]. Pt admitted with AMS. Pt plans to discharge back to ALF. CSW is aware and will arrange for return to facility. No CM needs.     Action/Plan:   Anticipated DC Date:  08/22/2014   Anticipated DC Plan:  ASSISTED LIVING / REST HOME  In-house referral  Clinical Social Worker      DC Planning Services  CM consult      Choice offered to / List presented to:             Status of service:  Completed, signed off Medicare Important Message given?  YES (If response is "NO", the following Medicare IM given date fields will be blank) Date Medicare IM given:  08/19/2014 Medicare IM given by:  Kathyrn SheriffHILDRESS,JESSICA Date Additional Medicare IM given:  08/22/2014 Additional Medicare IM given by:  [name] Simren Popson  Discharge Disposition:  SKILLED NURSING FACILITY  Per UR Regulation:  Reviewed for med. necessity/level of care/duration of stay  If discussed at Long Length of Stay Meetings, dates discussed:    Comments:  08/22/14 1410 Arlyss Queenammy Luian Schumpert, RN BSN CM Pt discharged to [facility]. CSW to arrange discharge to facility.  08/19/2014 1100 Kathyrn SheriffJessica Childress, RN, MSN, CM

## 2014-08-19 NOTE — ED Notes (Signed)
Spoke with Victorino DikeJennifer from [facility] who stated patient was confused all throughout the day, worse tonight. States that the prior nurse and the 3rd shift nurse questioned if she had right-sided facial droop. No facial droop noted at this time. Family states patient has increased confusion more than normal for several weeks.

## 2014-08-19 NOTE — Progress Notes (Signed)
PHARMACIST - PHYSICIAN ORDER COMMUNICATION  CONCERNING: P&T Medication Policy on Herbal Medications  DESCRIPTION:  This patient's order for:  BIOTIN  has been noted.  This product(s) is classified as an "herbal" or natural product. Due to a lack of definitive safety studies or FDA approval, nonstandard manufacturing practices, plus the potential risk of unknown drug-drug interactions while on inpatient medications, the Pharmacy and Therapeutics Committee does not permit the use of "herbal" or natural products of this type within [facility].   ACTION TAKEN: The pharmacy department is unable to verify this order at this time and your patient has been informed of this safety policy. Please reevaluate patient's clinical condition at discharge and address if the herbal or natural product(s) should be resumed at that time.   Scharlene GlossS. Zamaya Rapaport, PharmD

## 2014-08-19 NOTE — Progress Notes (Signed)
Patient was admitted to the hospital earlier this morning by Dr. Sharl MaLama.  Patient seen and examined. She is awake, confused. Unclear what her baseline mental status is. She does not appear to have any neurologic deficits. She does not have any clear evidence of cellulitis. She denies any shortness of breath. Abdomen is obese, soft except for right lower quadrant which appears to have an underlying hematoma after receiving an injection. There is bruising noted in the right lower quadrant. She has bilateral lower extremity edema. Temperature is improved with bear hugger.  The patient arrived to the hospital she was noted to be lethargic and hypothermic. Blood pressure was noted to be on the lower side. She did not have any obvious source of infection. Lactic acid was normal as was WBC count. It was not felt the patient had sepsis. She is being monitored closely. Blood cultures were sent and are currently in process. We will continue to monitor. Hold off on further IV fluids that she has significant lower extremity edema. We'll start the patient on low-dose Lasix. Check 2-D echocardiogram. Check CT of the abdomen and pelvis to rule out any occult infectious process. Urinalysis chest x-ray unremarkable. On arrival, she was noted to be hypoglycemic which was certainly explain her lethargy and hypothermia. She was started on D5 infusion. We'll discontinue D5 and monitor blood sugars. She does not appear to be on any oral hypoglycemics. Monitor for any signs of developing sepsis. Regarding her hyperkalemia. Will give patient 1 dose of Kayexalate as well as started on Lasix. Follow-up potassium later this afternoon.  MEMON,JEHANZEB

## 2014-08-19 NOTE — Clinical Social Work Psychosocial (Signed)
Clinical Social Work Department BRIEF PSYCHOSOCIAL ASSESSMENT 08/19/2014  Patient:  Amy Clay, Amy Clay     Account Number:  [id number]     Admit date:  08/18/2014  Clinical Social Worker:  Amy Clay  Date/Time:  08/19/2014 11:17 AM  Referred by:  CSW  Date Referred:  08/19/2014 Referred for  ALF Placement   Other Referral:   Interview type:  Family Other interview type:   Amy Clay, Sister  Amy Clay    PSYCHOSOCIAL DATA Living Status:  FACILITY Admitted from facility:  [facility] Level of care:  Assisted Living Primary support name:  Amy Clay Primary support relationship to patient:  SIBLING Degree of support available:   Patient's sister visits every other day.    CURRENT CONCERNS Current Concerns  Post-Acute Placement   Other Concerns:    SOCIAL WORK ASSESSMENT / PLAN CSW met with patient who was alert and oriented only to self.  CSW spoke with patient's sister, Amy Clay. Amy Clay indicated that patient has been at [facility] since 2014. She stated that since when patient initially went to the facility she used a walker.  She indicated that patient has been falling more recently and she is now in a wheelchair to prevent frequent falls.  Amy Clay indicated that patient has had physical therapy recently and has been using her walker some.   She stated that patient gets assistance with her ADL's from staff at the facility. Amy Clay indicated that she visits patient every other day. She indicated that she wanted patient to return to the facility upon discharge.  Amy Clay at [facility] confirmed Amy Clay's statements.  She indicated that patient can come back to the facility upon discharge.  Amy Clay stated that patient had strong family support and that patient's sister visits every other day and that other relatives visit patient as well.   Assessment/plan status:   Other assessment/ plan:   Information/referral to community resources:     PATIENT'S/FAMILY'S RESPONSE TO PLAN OF CARE: Family plans for patient to return to facility upon discharge.    [address] [place], [place]

## 2014-08-19 NOTE — ED Provider Notes (Signed)
CSN: [id number]     Arrival date & time 08/18/14  2355 History  This chart was scribed for Dione Booze, MD by Evon Slack, ED Scribe. This patient was seen in room APA01/APA01 and the patient's care was started at 12:02 AM.      Chief Complaint  Patient presents with  . Altered Mental Status   Patient is a 74 y.o. female presenting with altered mental status. The history is provided by the patient, the EMS personnel and the nursing home. No language interpreter was used.  Altered Mental Status  HPI Comments: Level 5 Caveat: Altered Mental Status Amy Clay is a 74 y.o. female brought in by ambulance, who presents to the Emergency Department for new onset of AMS onset 3 days prior. Pt denies cough, nausea, diarrhea, dysuria, difficulty urinating. Pt denies any pain at this time. Per nursing note pt is from [facility] nursing facility and staff reports pt has been talking weird for the past 3 days.   Past Medical History  Diagnosis Date  . High blood pressure   . Arthritis   . Depression   . Anxiety   . Hemorrhoids   . Hyperlipidemia   . Bipolar 1 disorder    Past Surgical History  Procedure Laterality Date  . Back surgery  03/16/87  . Cholecystectomy  08/15/93  . Partial hysterectomy  04/04/95  . Bladder tacked  04/04/95  . Ankle surgery  04/28/97    right ankle fracture  . Wrist surgery  02/24/09    right wrist fracture  . Colonoscopy  10/26/2002    Dr. Karilyn Cota- small external hemorrhoids otherwise normal   . Breast biopsy  05/16/95    right side  . Savory dilation  10/31/2011    Procedure: SAVORY DILATION;  Surgeon: Corbin Ade, MD;  Location: [facility];  Service: Endoscopy;  Laterality: N/A;  Elease Hashimoto dilation  10/31/2011    Procedure: Elease Hashimoto DILATION;  Surgeon: Corbin Ade, MD;  Location: [facility];  Service: Endoscopy;  Laterality: N/A;  . Abdominal hysterectomy     Family History  Problem Relation Age of Onset  . Arthritis    . Asthma    . Diabetes     . Colon cancer Neg Hx   . Anxiety disorder Paternal Aunt   . Anxiety disorder Paternal Uncle   . Bipolar disorder Cousin    History  Substance Use Topics  . Smoking status: Never Smoker   . Smokeless tobacco: Never Used  . Alcohol Use: No   OB History    Gravida Para Term Preterm AB TAB SAB Ectopic Multiple Living            0     Review of Systems  Unable to perform ROS   Allergies  Benzodiazepines; Ciprofloxacin; Cephalexin; and Penicillins  Home Medications   Prior to Admission medications   Medication Sig Start Date End Date Taking? Authorizing Provider  acetaminophen (TYLENOL) 500 MG tablet Take 1,000 mg by mouth every 6 (six) hours as needed for pain.    Historical Provider, MD  aspirin EC 81 MG tablet Take 81 mg by mouth every morning.     Historical Provider, MD  benztropine (COGENTIN) 0.5 MG tablet Take 1 tablet (0.5 mg total) by mouth 2 (two) times daily. 07/07/14 07/07/15  Myrlene Broker, MD  beta carotene w/minerals (OCUVITE) tablet Take 1 tablet by mouth every morning.     Historical Provider, MD  Biotin 5000 MCG CAPS Take  1 capsule by mouth every morning.    Historical Provider, MD  buPROPion (WELLBUTRIN XL) 300 MG 24 hr tablet Take 1 tablet (300 mg total) by mouth every morning. 07/07/14 07/07/15  Myrlene Broker, MD  calcium carbonate (OS-CAL) 600 MG TABS Take 1,200 mg by mouth daily with breakfast.     Historical Provider, MD  Cholecalciferol (VITAMIN D) 2000 UNITS CAPS Take 1 capsule by mouth every morning.     Historical Provider, MD  clonazePAM (KLONOPIN) 0.5 MG tablet Take 1 tablet (0.5 mg total) by mouth 2 (two) times daily as needed for anxiety. 07/07/14   Myrlene Broker, MD  docusate sodium (STOOL SOFTENER) 100 MG capsule Take 200 mg by mouth at bedtime.    Historical Provider, MD  furosemide (LASIX) 20 MG tablet Take 20 mg by mouth every Monday, Wednesday, and Friday.  01/26/14   Historical Provider, MD  lamoTRIgine (LAMICTAL) 100 MG tablet Take 1 tablet (100  mg total) by mouth 2 (two) times daily. 07/07/14   Myrlene Broker, MD  loratadine (CLARITIN) 10 MG tablet Take 10 mg by mouth daily as needed for allergies.    Historical Provider, MD  losartan (COZAAR) 100 MG tablet Take 100 mg by mouth every morning.  10/04/11   Historical Provider, MD  multivitamin (PROSIGHT) TABS tablet Take 1 tablet by mouth daily.    Historical Provider, MD  mupirocin ointment (BACTROBAN) 2 % Place 1 application into the nose 3 (three) times daily as needed (wound care).    Historical Provider, MD  omeprazole (PRILOSEC) 40 MG capsule Take 40 mg by mouth daily.    Historical Provider, MD  PARoxetine (PAXIL) 40 MG tablet Take 1 tablet (40 mg total) by mouth at bedtime. 07/07/14 07/07/15  Myrlene Broker, MD  polyethylene glycol [facility] Ethelene Hal) packet Take 17 g by mouth daily as needed for mild constipation.    Historical Provider, MD  risperiDONE (RISPERDAL) 0.25 MG tablet Take 1 tablet (0.25 mg total) by mouth 3 (three) times daily. Takes at 8:00AM, 2:00PM, 5:00PM. 07/07/14   Myrlene Broker, MD  risperiDONE (RISPERDAL) 1 MG tablet Take 1 tablet (1 mg total) by mouth at bedtime. 07/07/14   Myrlene Broker, MD  solifenacin (VESICARE) 10 MG tablet Take 10 mg by mouth every morning.     Historical Provider, MD  sulfamethoxazole-trimethoprim (SEPTRA DS) 800-160 MG per tablet Take 1 tablet by mouth every 12 (twelve) hours. Patient not taking: Reported on 06/15/2014 04/28/14   Zadie Rhine, MD  terazosin (HYTRIN) 2 MG capsule Take 2 mg by mouth at bedtime.     Historical Provider, MD  traMADol (ULTRAM) 50 MG tablet Take 50 mg by mouth as needed.    Historical Provider, MD  vitamin B-12 (CYANOCOBALAMIN) 1000 MCG tablet Take 1,000 mcg by mouth daily.    Historical Provider, MD  vitamin C (ASCORBIC ACID) 500 MG tablet Take 1,000 mg by mouth daily.     Historical Provider, MD   There were no vitals taken for this visit.   Physical Exam  Constitutional: She appears well-developed and  well-nourished. No distress.  HENT:  Head: Normocephalic and atraumatic.  Eyes: Conjunctivae and EOM are normal. Pupils are equal, round, and reactive to light.  Neck: Normal range of motion. Neck supple. No JVD present.  Cardiovascular: Normal rate, regular rhythm and normal heart sounds.   No murmur heard. Pulmonary/Chest: Effort normal and breath sounds normal. She has no wheezes. She has no rales. She exhibits  no tenderness.  Abdominal: Soft. Bowel sounds are normal. She exhibits no distension and no mass. There is no tenderness.  Musculoskeletal: Normal range of motion.  2+ edema with venous stasis changes.   Lymphadenopathy:    She has no cervical adenopathy.  Neurological: She is alert. No cranial nerve deficit. She exhibits normal muscle tone. Coordination normal.  Oriented to person and place but not time. No motor or sensory deficits.  Skin: Skin is warm and dry. No rash noted.  Psychiatric:  Flat affect.   Nursing note and vitals reviewed.   ED Course  Procedures (including critical care time) Labs Review Results for orders placed or performed during the hospital encounter of 08/18/14  Culture, blood (routine x 2)  Result Value Ref Range   Specimen Description BLOOD RIGHT ANTECUBITAL    Special Requests BOTTLES DRAWN AEROBIC AND ANAEROBIC 10CC EACH    Culture PENDING    Report Status PENDING   Culture, blood (routine x 2)  Result Value Ref Range   Specimen Description BLOOD LEFT HAND    Special Requests      BOTTLES DRAWN AEROBIC AND ANAEROBIC AEB 10CC ANA 5CC   Culture PENDING    Report Status PENDING   Comprehensive metabolic panel  Result Value Ref Range   Sodium 140 135 - 145 mmol/L   Potassium 5.9 (H) 3.5 - 5.1 mmol/L   Chloride 107 96 - 112 mmol/L   CO2 28 19 - 32 mmol/L   Glucose, Bld 72 70 - 99 mg/dL   BUN 55 (H) 6 - 23 mg/dL   Creatinine, Ser 4.09 (H) 0.50 - 1.10 mg/dL   Calcium 9.6 8.4 - 81.1 mg/dL   Total Protein 6.4 6.0 - 8.3 g/dL   Albumin 3.2  (L) 3.5 - 5.2 g/dL   AST 44 (H) 0 - 37 U/L   ALT 54 (H) 0 - 35 U/L   Alkaline Phosphatase 134 (H) 39 - 117 U/L   Total Bilirubin 0.3 0.3 - 1.2 mg/dL   GFR calc non Af Amer 30 (L) >90 mL/min   GFR calc Af Amer 34 (L) >90 mL/min   Anion gap 5 5 - 15  CBC with Differential  Result Value Ref Range   WBC 4.5 4.0 - 10.5 K/uL   RBC 3.07 (L) 3.87 - 5.11 MIL/uL   Hemoglobin 9.1 (L) 12.0 - 15.0 g/dL   HCT 91.4 (L) 78.2 - 95.6 %   MCV 98.0 78.0 - 100.0 fL   MCH 29.6 26.0 - 34.0 pg   MCHC 30.2 30.0 - 36.0 g/dL   RDW 21.3 (H) 08.6 - 57.8 %   Platelets 132 (L) 150 - 400 K/uL   Neutrophils Relative % 70 43 - 77 %   Neutro Abs 3.1 1.7 - 7.7 K/uL   Lymphocytes Relative 19 12 - 46 %   Lymphs Abs 0.8 0.7 - 4.0 K/uL   Monocytes Relative 7 3 - 12 %   Monocytes Absolute 0.3 0.1 - 1.0 K/uL   Eosinophils Relative 4 0 - 5 %   Eosinophils Absolute 0.2 0.0 - 0.7 K/uL   Basophils Relative 0 0 - 1 %   Basophils Absolute 0.0 0.0 - 0.1 K/uL  Urinalysis, Routine w reflex microscopic  Result Value Ref Range   Color, Urine YELLOW YELLOW   APPearance CLEAR CLEAR   Specific Gravity, Urine 1.025 1.005 - 1.030   pH 5.5 5.0 - 8.0   Glucose, UA NEGATIVE NEGATIVE mg/dL   Hgb urine dipstick  NEGATIVE NEGATIVE   Bilirubin Urine NEGATIVE NEGATIVE   Ketones, ur NEGATIVE NEGATIVE mg/dL   Protein, ur NEGATIVE NEGATIVE mg/dL   Urobilinogen, UA 0.2 0.0 - 1.0 mg/dL   Nitrite NEGATIVE NEGATIVE   Leukocytes, UA NEGATIVE NEGATIVE  POC CBG, ED  Result Value Ref Range   Glucose-Capillary 62 (L) 70 - 99 mg/dL   Comment 1 Call MD NNP PA CNM   I-Stat CG4 Lactic Acid, ED  Result Value Ref Range   Lactic Acid, Venous 0.78 0.5 - 2.0 mmol/L  CBG monitoring, ED  Result Value Ref Range   Glucose-Capillary 79 70 - 99 mg/dL  CBG monitoring, ED  Result Value Ref Range   Glucose-Capillary 82 70 - 99 mg/dL   Imaging Review Ct Head Wo Contrast  08/19/2014   CLINICAL DATA:  Altered mental status, onset 3 days ago.  EXAM: CT HEAD  WITHOUT CONTRAST  TECHNIQUE: Contiguous axial images were obtained from the base of the skull through the vertex without intravenous contrast.  COMPARISON:  03/12/2014  FINDINGS: Technically limited study due to motion artifact. Diffuse cerebral atrophy. Ventricular dilatation consistent with central atrophy. Low-attenuation changes throughout the deep and periventricular white matter consistent small vessel ischemia. Old focal areas of encephalomalacia in the posterior parietal lobes bilaterally. No mass effect or midline shift. No abnormal extra-axial fluid collections. Gray-white matter junctions are distinct. Basal cisterns are not effaced. No evidence of acute intracranial hemorrhage. No depressed skull fractures. Mucosal thickening in the paranasal sinuses. Mastoid air cells are not opacified. Vascular calcifications.  IMPRESSION: No acute intracranial abnormalities. 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   Electronically Signed   By: Burman Nieves M.D.   On: 08/19/2014 01:28   Dg Chest Port 1 View  08/19/2014   CLINICAL DATA:  New onset altered mental status for 3 days.  EXAM: PORTABLE CHEST - 1 VIEW  COMPARISON:  04/28/2014  FINDINGS: Shallow inspiration with elevation of the right hemidiaphragm. Normal heart size and pulmonary vascularity. No focal airspace disease or consolidation in the lungs. No blunting of costophrenic angles. No pneumothorax. Calcification of the aorta. Degenerative changes in the spine.  IMPRESSION: No active disease.   Electronically Signed   By: Burman Nieves M.D.   On: 08/19/2014 00:56     EKG Interpretation   Date/Time:  Friday August 19 2014 00:31:17 EDT Ventricular Rate:  72 PR Interval:  236 QRS Duration: 100 QT Interval:  399 QTC Calculation: 437 R Axis:   5 Text Interpretation:  Sinus rhythm Prolonged PR interval Low voltage,  precordial leads Abnormal R-wave progression, early transition When  compared with ECG of  04/28/2014, No significant change was found Confirmed  by [facility]  MD, Tiffani Kadow ([id number]) on 08/19/2014 12:37:54 AM      CRITICAL CARE Performed by: UEAVW,[email or web address] Total critical care time: 45 minutes Critical care time was exclusive of separately billable procedures and treating other patients. Critical care was necessary to treat or prevent imminent or life-threatening deterioration. Critical care was time spent personally by me on the following activities: development of treatment plan with patient and/or surrogate as well as nursing, discussions with consultants, evaluation of patient's response to treatment, examination of patient, obtaining history from patient or surrogate, ordering and performing treatments and interventions, ordering and review of laboratory studies, ordering and review of radiographic studies, pulse oximetry and re-evaluation of patient's condition.  MDM   Final diagnoses:  Altered mental status  Hypothermia due to non-environmental cause, initial encounter  Hypoglycemia  Renal insufficiency  Hyperkalemia  Normocytic normochromic anemia  Thrombocytopenia      Altered mental status of uncertain cause. Per nursing home, symptoms of been present for 3 days. Workup is initiated with focus on possible occult infection. Mild hypoglycemia had been noted by a EMS. CBG was repeated and noted to be slightly low. She was given orange juice with improvement and blood sugar but no change in mentation. She is noted to be markedly hypothermic which could also be contributing to her altered mentation. She is placed on a warming blanket. Families arrived and agree that she is not at her baseline, but they're unclear when the last time she was normal. She was last seen by family 2 days ago, but she was sleeping at that time. Chest x-ray showed no evidence of pneumonia. Head CT was unremarkable. Urinalysis showed no evidence of infection. Lactic acid level is come back normal. Without evidence  of sepsis, no antibiotics were administered. Case is discussed with Dr. Sharl MaLama of [facility] who agrees to admit the patient. Because of hypothermia, she will be admitted to stepdown unit.  I personally performed the services described in this documentation, which was scribed in my presence. The recorded information has been reviewed and is accurate.       Dione Boozeavid Naif Alabi, MD 08/19/14 [phone]

## 2014-08-19 NOTE — Care Management Utilization Note (Signed)
UR complete 

## 2014-08-19 NOTE — ED Notes (Signed)
AC called for bear hugger blanket.

## 2014-08-19 NOTE — H&P (Signed)
PCP:   Colette Ribas, MD   Chief Complaint:  Altered mental status  HPI: 74 year old female who   has a past medical history of High blood pressure; Arthritis; Depression; Anxiety; Hemorrhoids; Hyperlipidemia; and Bipolar 1 disorder. patient was brought to the emergency department for new onset of altered mental status for past 3 days. Patient's sister is at bedside who says that patient has been lethargic and sleeping more than usual for past 2 weeks. She currently resides at [facility]. Patient is lethargic but arousable, she is alert oriented 2 to place and person. She denies chest pain, no shortness of breath no nausea vomiting or diarrhea. In the ED patient was found to have hypothermia with temperature of 94.5, hyperkalemia with mild renal insufficiency. Lactic acid is 0.78   Allergies:   Allergies  Allergen Reactions  . Benzodiazepines Other (See Comments)    Memory problems on Ativan, which goes away when she is off of it.   . Ciprofloxacin     Rash at site of injection with itching  . Cephalexin Rash and Other (See Comments)    Blistering of the mouth  . Penicillins Rash      Past Medical History  Diagnosis Date  . High blood pressure   . Arthritis   . Depression   . Anxiety   . Hemorrhoids   . Hyperlipidemia   . Bipolar 1 disorder     Past Surgical History  Procedure Laterality Date  . Back surgery  03/16/87  . Cholecystectomy  08/15/93  . Partial hysterectomy  04/04/95  . Bladder tacked  04/04/95  . Ankle surgery  04/28/97    right ankle fracture  . Wrist surgery  02/24/09    right wrist fracture  . Colonoscopy  10/26/2002    Dr. Karilyn Cota- small external hemorrhoids otherwise normal   . Breast biopsy  05/16/95    right side  . Savory dilation  10/31/2011    Procedure: SAVORY DILATION;  Surgeon: Corbin Ade, MD;  Location: [facility];  Service: Endoscopy;  Laterality: N/A;  Elease Hashimoto dilation  10/31/2011    Procedure: Elease Hashimoto  DILATION;  Surgeon: Corbin Ade, MD;  Location: [facility];  Service: Endoscopy;  Laterality: N/A;  . Abdominal hysterectomy      Prior to Admission medications   Medication Sig Start Date End Date Taking? Authorizing Provider  acetaminophen (TYLENOL) 500 MG tablet Take 1,000 mg by mouth every 6 (six) hours as needed for pain.    Historical Provider, MD  aspirin EC 81 MG tablet Take 81 mg by mouth every morning.     Historical Provider, MD  benztropine (COGENTIN) 0.5 MG tablet Take 1 tablet (0.5 mg total) by mouth 2 (two) times daily. 07/07/14 07/07/15  Myrlene Broker, MD  beta carotene w/minerals (OCUVITE) tablet Take 1 tablet by mouth every morning.     Historical Provider, MD  Biotin 5000 MCG CAPS Take 1 capsule by mouth every morning.    Historical Provider, MD  buPROPion (WELLBUTRIN XL) 300 MG 24 hr tablet Take 1 tablet (300 mg total) by mouth every morning. 07/07/14 07/07/15  Myrlene Broker, MD  calcium carbonate (OS-CAL) 600 MG TABS Take 1,200 mg by mouth daily with breakfast.     Historical Provider, MD  Cholecalciferol (VITAMIN D) 2000 UNITS CAPS Take 1 capsule by mouth every morning.     Historical Provider, MD  clonazePAM (KLONOPIN) 0.5 MG tablet Take 1 tablet (0.5  mg total) by mouth 2 (two) times daily as needed for anxiety. 07/07/14   Myrlene Broker, MD  docusate sodium (STOOL SOFTENER) 100 MG capsule Take 200 mg by mouth at bedtime.    Historical Provider, MD  furosemide (LASIX) 20 MG tablet Take 20 mg by mouth every Monday, Wednesday, and Friday.  01/26/14   Historical Provider, MD  lamoTRIgine (LAMICTAL) 100 MG tablet Take 1 tablet (100 mg total) by mouth 2 (two) times daily. 07/07/14   Myrlene Broker, MD  loratadine (CLARITIN) 10 MG tablet Take 10 mg by mouth daily as needed for allergies.    Historical Provider, MD  losartan (COZAAR) 100 MG tablet Take 100 mg by mouth every morning.  10/04/11   Historical Provider, MD  multivitamin (PROSIGHT) TABS tablet Take 1 tablet by mouth  daily.    Historical Provider, MD  mupirocin ointment (BACTROBAN) 2 % Place 1 application into the nose 3 (three) times daily as needed (wound care).    Historical Provider, MD  omeprazole (PRILOSEC) 40 MG capsule Take 40 mg by mouth daily.    Historical Provider, MD  PARoxetine (PAXIL) 40 MG tablet Take 1 tablet (40 mg total) by mouth at bedtime. 07/07/14 07/07/15  Myrlene Broker, MD  polyethylene glycol [facility] Ethelene Hal) packet Take 17 g by mouth daily as needed for mild constipation.    Historical Provider, MD  risperiDONE (RISPERDAL) 0.25 MG tablet Take 1 tablet (0.25 mg total) by mouth 3 (three) times daily. Takes at 8:00AM, 2:00PM, 5:00PM. 07/07/14   Myrlene Broker, MD  risperiDONE (RISPERDAL) 1 MG tablet Take 1 tablet (1 mg total) by mouth at bedtime. 07/07/14   Myrlene Broker, MD  solifenacin (VESICARE) 10 MG tablet Take 10 mg by mouth every morning.     Historical Provider, MD  sulfamethoxazole-trimethoprim (SEPTRA DS) 800-160 MG per tablet Take 1 tablet by mouth every 12 (twelve) hours. Patient not taking: Reported on 06/15/2014 04/28/14   Zadie Rhine, MD  terazosin (HYTRIN) 2 MG capsule Take 2 mg by mouth at bedtime.     Historical Provider, MD  traMADol (ULTRAM) 50 MG tablet Take 50 mg by mouth as needed.    Historical Provider, MD  vitamin B-12 (CYANOCOBALAMIN) 1000 MCG tablet Take 1,000 mcg by mouth daily.    Historical Provider, MD  vitamin C (ASCORBIC ACID) 500 MG tablet Take 1,000 mg by mouth daily.     Historical Provider, MD    Social History:  reports that she has never smoked. She has never used smokeless tobacco. She reports that she does not drink alcohol or use illicit drugs.  Family History  Problem Relation Age of Onset  . Arthritis    . Asthma    . Diabetes    . Colon cancer Neg Hx   . Anxiety disorder Paternal Aunt   . Anxiety disorder Paternal Uncle   . Bipolar disorder Cousin       Review of Systems:  Unobtainable as patient is a poor  historian   Physical Exam: Blood pressure 106/63, pulse 68, temperature 94.5 F (34.7 C), temperature source Rectal, resp. rate 12, height  (1.626 m), weight 102.059 kg (225 lb), SpO2 95 %. Constitutional:   Patient is a morbidly obese female in no acute distress and cooperative with exam. Head: Normocephalic and atraumatic Mouth: Mucus membranes moist Eyes: PERRL, EOMI, conjunctivae normal Neck: Supple, No Thyromegaly Cardiovascular: RRR, S1 normal, S2 normal Pulmonary/Chest: Bibasilar crackles Abdominal: Soft. Non-tender, non-distended, bowel sounds  are normal, no masses, organomegaly, or guarding present.  Neurological: A&O x3, Strength is normal and symmetric bilaterally, cranial nerve II-XII are grossly intact, no focal motor deficit, sensory intact to light touch bilaterally.  Extremities : Bilateral 2+ pitting edema  Labs on Admission:  Basic Metabolic Panel:  Recent Labs Lab 08/19/14 0036  NA 140  K 5.9*  CL 107  CO2 28  GLUCOSE 72  BUN 55*  CREATININE 1.64*  CALCIUM 9.6   Liver Function Tests:  Recent Labs Lab 08/19/14 0036  AST 44*  ALT 54*  ALKPHOS 134*  BILITOT 0.3  PROT 6.4  ALBUMIN 3.2*   No results for input(s): LIPASE, AMYLASE in the last 168 hours. No results for input(s): AMMONIA in the last 168 hours. CBC:  Recent Labs Lab 08/19/14 0036  WBC 4.5  NEUTROABS 3.1  HGB 9.1*  HCT 30.1*  MCV 98.0  PLT 132*    CBG:  Recent Labs Lab 08/19/14 0013 08/19/14 0104 08/19/14 0225  GLUCAP 62* 79 82    Radiological Exams on Admission: Ct Head Wo Contrast  08/19/2014   CLINICAL DATA:  Altered mental status, onset 3 days ago.  EXAM: CT HEAD WITHOUT CONTRAST  TECHNIQUE: Contiguous axial images were obtained from the base of the skull through the vertex without intravenous contrast.  COMPARISON:  03/12/2014  FINDINGS: Technically limited study due to motion artifact. Diffuse cerebral atrophy. Ventricular dilatation consistent with central  atrophy. Low-attenuation changes throughout the deep and periventricular white matter consistent small vessel ischemia. Old focal areas of encephalomalacia in the posterior parietal lobes bilaterally. No mass effect or midline shift. No abnormal extra-axial fluid collections. Gray-white matter junctions are distinct. Basal cisterns are not effaced. No evidence of acute intracranial hemorrhage. No depressed skull fractures. Mucosal thickening in the paranasal sinuses. Mastoid air cells are not opacified. Vascular calcifications.  IMPRESSION: No acute intracranial abnormalities. 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   Electronically Signed   By: Burman NievesWilliam  Stevens M.D.   On: 08/19/2014 01:28   Dg Chest Port 1 View  08/19/2014   CLINICAL DATA:  New onset altered mental status for 3 days.  EXAM: PORTABLE CHEST - 1 VIEW  COMPARISON:  04/28/2014  FINDINGS: Shallow inspiration with elevation of the right hemidiaphragm. Normal heart size and pulmonary vascularity. No focal airspace disease or consolidation in the lungs. No blunting of costophrenic angles. No pneumothorax. Calcification of the aorta. Degenerative changes in the spine.  IMPRESSION: No active disease.   Electronically Signed   By: Burman NievesWilliam  Stevens M.D.   On: 08/19/2014 00:56    EKG: Independently reviewed. Sinus rhythm, prolonged PR  Interval   Assessment/Plan Active Problems:   Major depression   Acute on chronic renal failure   Dementia   Confusion   Altered mental status   Hyperkalemia   Hypothermia  Altered mental status Patient presented with lethargy, mild confusion likely from the polypharmacy. Patient is on multiple psychotropic medications for depression, bipolar disorder. Lactic acid is 0.78, white count is normal. Will monitor the patient in stepdown unit. UA is normal chest x-ray shows no pneumonia. No antibiotics started at this time.  Hypothermia ? Cause, patient presented with hypothermia.  No signs or symptoms of sepsis. Warming blanket ordered. Will closely monitor the patient in stepdown unit.  Hyperkalemia Will give 1 dose of Kayexalate 15 g 1. Check BMP in a.m.  Acute on chronic renal failure Patient has mild acute on chronic renal failure. Will start gentle  IV hydration. Will hold the Lasix at this time  Depression Continue home medications. Will hold clonazepam at this time due to altered mental status  Hypertension Will hold Cozaar at this time due to hyperkalemia.  Code status: DO NOT RESUSCITATE  Family discussion: Admission, patients condition and plan of care including tests being ordered have been discussed with the patient and *her sister at bedside who indicate understanding and agree with the plan and Code Status.   Time Spent on Admission: 60 min  Quinlee Sciarra S [facility] Pager: [phone] 08/19/2014, 2:43 AM  If 7PM-7AM, please contact night-coverage  [email or web address]  Password [email or web address]

## 2014-08-20 DIAGNOSIS — D696 Thrombocytopenia, unspecified: Secondary | ICD-10-CM

## 2014-08-20 DIAGNOSIS — S301XXA Contusion of abdominal wall, initial encounter: Secondary | ICD-10-CM | POA: Diagnosis present

## 2014-08-20 DIAGNOSIS — E162 Hypoglycemia, unspecified: Secondary | ICD-10-CM | POA: Diagnosis present

## 2014-08-20 LAB — CBC
HCT: 31.6 % — ABNORMAL LOW (ref 36.0–46.0)
HEMOGLOBIN: 9.4 g/dL — AB (ref 12.0–15.0)
MCH: 28.7 pg (ref 26.0–34.0)
MCHC: 29.7 g/dL — ABNORMAL LOW (ref 30.0–36.0)
MCV: 96.6 fL (ref 78.0–100.0)
PLATELETS: 162 10*3/uL (ref 150–400)
RBC: 3.27 MIL/uL — ABNORMAL LOW (ref 3.87–5.11)
RDW: 17.3 % — ABNORMAL HIGH (ref 11.5–15.5)
WBC: 4.6 10*3/uL (ref 4.0–10.5)

## 2014-08-20 LAB — TSH: TSH: 2.751 u[IU]/mL (ref 0.350–4.500)

## 2014-08-20 LAB — BASIC METABOLIC PANEL
ANION GAP: 8 (ref 5–15)
BUN: 47 mg/dL — AB (ref 6–23)
CO2: 27 mmol/L (ref 19–32)
Calcium: 9.6 mg/dL (ref 8.4–10.5)
Chloride: 106 mmol/L (ref 96–112)
Creatinine, Ser: 1.82 mg/dL — ABNORMAL HIGH (ref 0.50–1.10)
GFR calc non Af Amer: 26 mL/min — ABNORMAL LOW (ref >90)
GFR, EST AFRICAN AMERICAN: 30 mL/min — AB (ref >90)
GLUCOSE: 75 mg/dL (ref 70–99)
POTASSIUM: 5 mmol/L (ref 3.5–5.1)
Sodium: 141 mmol/L (ref 135–145)

## 2014-08-20 LAB — GLUCOSE, CAPILLARY
GLUCOSE-CAPILLARY: 83 mg/dL (ref 70–99)
GLUCOSE-CAPILLARY: 102 mg/dL — AB (ref 70–99)
Glucose-Capillary: 73 mg/dL (ref 70–99)
Glucose-Capillary: 86 mg/dL (ref 70–99)
Glucose-Capillary: 97 mg/dL (ref 70–99)

## 2014-08-20 MED ORDER — CLONAZEPAM 0.5 MG PO TABS: 0.2500 mg | ORAL_TABLET | Freq: Two times a day (BID) | ORAL | Status: DC | PRN

## 2014-08-20 MED ORDER — FUROSEMIDE 20 MG PO TABS
20.0000 mg | ORAL_TABLET | Freq: Every day | ORAL | Status: DC
  Administered 2014-08-21 – 2014-08-22 (×2): 20 mg via ORAL
  Filled 2014-08-20 (×2): qty 1

## 2014-08-20 NOTE — Progress Notes (Signed)
PT ALERT AND ORIENTED TO SELF AND THAT SHE IS IN THE HOSPITAL. ON ROOM AIR. O2 SATS 91 TO 95%. LUNG SOUNDS CLEAR.HR 70'S TO 80'S IN NSR. LT IV NSL IS PATENT. PT HAS HAD A VERY LARGE BROWN/GOLD FORMED SOFT STOOL.SHE CONTINUES TO BE INCONTINENT OF LARGE AMT OF URINE THAT HAS NO ODOR. SISTER AT BEDSIDE AT TIME OF TRANSFER TO ROOM 320. TRANSFER REPORT CALLED TO COURTNEY RN ON 300.

## 2014-08-20 NOTE — Progress Notes (Signed)
[facility] PROGRESS NOTE  Amy Clay ZOX:[id number] DOB: May 18, 1940 DOA: 08/18/2014 PCP: Colette Ribas, MD    Code Status: DNR Family Communication: Family not available Disposition Plan: Discharge when clinically appropriate, likely in the next 1-2 days.   Consultants:  None  Procedures:  2-D echocardiogram:  Study Conclusions - Left ventricle: The cavity size was normal. Wall thickness was increased in a pattern of mild LVH. Systolic function was normal. The estimated ejection fraction was in the range of 60% to 65%. Wall motion was normal; there were no regional wall motion abnormalities. The study is not technically sufficient to allow evaluation of LV diastolic function. - Aortic valve: Mildly calcified annulus. Trileaflet; mildly thickened leaflets. Valve area (VTI): 2.24 cm^2. Valve area (Vmax): 2.04 cm^2. - Mitral valve: Mildly calcified annulus. Normal thickness leaflets . - Technically adequate study.  Antibiotics:  None  HPI/Subjective: The patient is sitting up in bed. She has no complaints of chest pain, abdominal pain, or shortness of breath. Nursing reports no acute changes overnight. Apparently, the patient has not had any bowel movements despite being given Kayexalate, per nursing.  Objective: Filed Vitals:   08/20/14 0800  BP: 119/91  Pulse: 87  Temp: 97.9 F (36.6 C)  Resp: 22   pulse 86. Risk to rate 19. Blood pressure 139/71. Oxygen saturation 95% on room air.  Intake/Output Summary (Last 24 hours) at 08/20/14 0814 Last data filed at 08/20/14 0600  Gross per 24 hour  Intake 899.17 ml  Output    125 ml  Net 774.17 ml   Filed Weights   08/19/14 0005 08/19/14 0345 08/20/14 0500  Weight: 102.059 kg (225 lb) 114.9 kg (253 lb 4.9 oz) 113.4 kg (250 lb)    Exam:   General:  Morbidly obese alert 74 year old woman sitting up in bed, in no acute distress.  Cardiovascular: S1, S2, with a soft systolic  murmur.  Respiratory: Clear anteriorly with decreased breath sounds in bases.  Abdomen: Mild ecchymosis right abdominal wall with mild tenderness; no appreciable mass nodule or fluctuance palpated. Bowel sounds present.  Musculoskeletal/extremities: No acute hot red joints. Multiple lower extremity varicosities. Trace to 1+ bilateral lower extremity nonpitting edema.  Neurologic: She is alert and oriented to herself and hospital. Her speech is clear. Cranial nerves II through XII are grossly intact.   Data Reviewed: Basic Metabolic Panel:  Recent Labs Lab 08/19/14 0036 08/19/14 0538 08/19/14 1549 08/20/14 0508  NA 140 143  --  141  K 5.9* 5.8* 5.4* 5.0  CL 107 110  --  106  CO2 28 27  --  27  GLUCOSE 72 89  --  75  BUN 55* 54*  --  47*  CREATININE 1.64* 1.53*  --  1.82*  CALCIUM 9.6 9.8  --  9.6   Liver Function Tests:  Recent Labs Lab 08/19/14 0036 08/19/14 0538  AST 44* 45*  ALT 54* 54*  ALKPHOS 134* 132*  BILITOT 0.3 0.3  PROT 6.4 6.3  ALBUMIN 3.2* 3.1*   No results for input(s): LIPASE, AMYLASE in the last 168 hours. No results for input(s): AMMONIA in the last 168 hours. CBC:  Recent Labs Lab 08/19/14 0036 08/19/14 0538 08/20/14 0508  WBC 4.5 4.0 4.6  NEUTROABS 3.1  --   --   HGB 9.1* 8.9* 9.4*  HCT 30.1* 29.8* 31.6*  MCV 98.0 97.4 96.6  PLT 132* 143* 162   Cardiac Enzymes: No results for input(s): CKTOTAL, CKMB, CKMBINDEX, TROPONINI in the last  168 hours. BNP (last 3 results) No results for input(s): BNP in the last 8760 hours.  ProBNP (last 3 results) No results for input(s): PROBNP in the last 8760 hours.  CBG:  Recent Labs Lab 08/19/14 1151 08/19/14 1653 08/19/14 2157 08/20/14 0505 08/20/14 0723  GLUCAP 75 87 77 73 83    Recent Results (from the past 240 hour(s))  Culture, blood (routine x 2)     Status: None (Preliminary result)   Collection Time: 08/19/14 12:36 AM  Result Value Ref Range Status   Specimen Description BLOOD  RIGHT ANTECUBITAL  Final   Special Requests BOTTLES DRAWN AEROBIC AND ANAEROBIC 10CC EACH  Final   Culture NO GROWTH <24 HRS  Final   Report Status PENDING  Incomplete  Culture, blood (routine x 2)     Status: None (Preliminary result)   Collection Time: 08/19/14 12:50 AM  Result Value Ref Range Status   Specimen Description BLOOD LEFT HAND  Final   Special Requests   Final    BOTTLES DRAWN AEROBIC AND ANAEROBIC AEB 10CC ANA 5CC   Culture NO GROWTH <24 HRS  Final   Report Status PENDING  Incomplete  MRSA PCR Screening     Status: None   Collection Time: 08/19/14  3:35 AM  Result Value Ref Range Status   MRSA by PCR NEGATIVE NEGATIVE Final    Comment:        The GeneXpert MRSA Assay (FDA approved for NASAL specimens only), is one component of a comprehensive MRSA colonization surveillance program. It is not intended to diagnose MRSA infection nor to guide or monitor treatment for MRSA infections.      Studies: Ct Abdomen Pelvis Wo Contrast  08/19/2014   CLINICAL DATA:  Abdominal wall bruising  EXAM: CT ABDOMEN AND PELVIS WITHOUT CONTRAST  TECHNIQUE: Multidetector CT imaging of the abdomen and pelvis was performed following the standard protocol without IV contrast.  COMPARISON:  None.  FINDINGS: Lung bases are free of acute infiltrate or sizable effusion.  The liver, spleen, adrenal glands and pancreas are within normal limits.  Kidneys are well visualized bilaterally. No renal calculi or urinary tract obstructive changes are seen. Mild aortoiliac calcifications are noted. The bladder is significantly distended without filling defect. A fat containing umbilical hernia is noted.  There are changes in the subcutaneous tissues in the lateral abdominal wall bilaterally. On the right however there is a focal fluid collection which is incompletely evaluated on this exam. It measures at least 13 cm in AP dimension. The transverse measurement is approximately 3 cm although the far lateral aspect  of this collection is not well visualized. Given the patient's history of bruising this likely represents a subcutaneous hematoma. This may be related to recent fall no fractures are seen. Degenerative change of the lumbar spine is noted. Fecal material is noted throughout the colon.  IMPRESSION: Changes in the right lateral abdominal wall consistent with focal hematoma likely related to a fall.  No other acute abnormality is noted.   Electronically Signed   By: Alcide CleverMark  Lukens M.D.   On: 08/19/2014 14:36   Ct Head Wo Contrast  08/19/2014   CLINICAL DATA:  Altered mental status, onset 3 days ago.  EXAM: CT HEAD WITHOUT CONTRAST  TECHNIQUE: Contiguous axial images were obtained from the base of the skull through the vertex without intravenous contrast.  COMPARISON:  03/12/2014  FINDINGS: Technically limited study due to motion artifact. Diffuse cerebral atrophy. Ventricular dilatation consistent with central atrophy. Low-attenuation  changes throughout the deep and periventricular white matter consistent small vessel ischemia. Old focal areas of encephalomalacia in the posterior parietal lobes bilaterally. No mass effect or midline shift. No abnormal extra-axial fluid collections. Gray-white matter junctions are distinct. Basal cisterns are not effaced. No evidence of acute intracranial hemorrhage. No depressed skull fractures. Mucosal thickening in the paranasal sinuses. Mastoid air cells are not opacified. Vascular calcifications.  IMPRESSION: No acute intracranial abnormalities. 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   Electronically Signed   By: Burman Nieves M.D.   On: 08/19/2014 01:28   Dg Chest Port 1 View  08/19/2014   CLINICAL DATA:  New onset altered mental status for 3 days.  EXAM: PORTABLE CHEST - 1 VIEW  COMPARISON:  04/28/2014  FINDINGS: Shallow inspiration with elevation of the right hemidiaphragm. Normal heart size and pulmonary vascularity. No focal airspace  disease or consolidation in the lungs. No blunting of costophrenic angles. No pneumothorax. Calcification of the aorta. Degenerative changes in the spine.  IMPRESSION: No active disease.   Electronically Signed   By: Burman Nieves M.D.   On: 08/19/2014 00:56    Scheduled Meds: . antiseptic oral rinse  7 mL Mouth Rinse BID  . aspirin EC  81 mg Oral Daily  . benztropine  0.5 mg Oral BID  . buPROPion  300 mg Oral Daily  . darifenacin  15 mg Oral Daily  . [START ON 08/21/2014] furosemide  20 mg Oral Daily  . lamoTRIgine  100 mg Oral BID  . pantoprazole  40 mg Oral Daily  . PARoxetine  40 mg Oral QHS  . risperiDONE  0.25 mg Oral TID  . risperiDONE  1 mg Oral QHS   Continuous Infusions:   Assessment and plan: Principal Problem:   Altered mental status Active Problems:   Hypothermia   Hematoma of abdominal wall   Bilateral lower extremity edema   Hypoglycemia   Major depression   Acute on chronic renal failure   Dementia   Benign essential HTN   Altered mental state   CKD (chronic kidney disease) stage 3, GFR 30-59 ml/min   Morbid obesity   Thrombocytopenia    1. Acute encephalopathy/altered mental status. Currently, this appears to be resolved. She is alert and oriented to herself and hospital. She follows directions well. Etiology possibly secondary to polypharmacy and/or metabolic arrangements including hypothermia and hypoglycemia. Although not recorded in the notes, it is likely she has had a recent fall which resulted in a right abdominal wall hematoma. Her chest x-ray and urinalysis are not indicative of infection. CT revealed no obvious sign of intra-abdominal infection. Continue supportive treatment.  Right lateral abdominal wall hematoma. Although not recorded, it is likely that patient had a recent fall at the nursing facility which resulted in a focal hematoma, as recorded by CT and abdomen. Patient was anemic on admission. There appears to be no significant decrease  in her hemoglobin. However, will hold subcutaneous heparin and start DVT prophylaxis with SCDs.  Hypothermia. Etiology unknown, but may have been secondary to an undocumented fall at the nursing facility. She has no signs of sepsis. Blood cultures are negative to date. For further evaluation, will order cortisol level, TSH, and free T4. Resolved, status post body warmer/bear hugger  Hypoglycemia. Etiology unclear, but may be secondary to fall. No recorded history of diabetes. Further evaluation, we'll check a cortisol level, A1c, and thyroid function tests.  Anemia and thrombocytopenia. We'll order total iron, ferritin, B12,  and thyroid function tests for evaluation.  Bilateral lower extremity edema. Etiology may be secondary to multiple varicose veins and venous insufficiency. 2-D echocardiogram not diagnostic of diastolic dysfunction; but her ejection fraction was noted to be 60-65% so doubt systolic heart failure. IV Lasix was started with improvement. Will change to by mouth Lasix and start SCDs which will help.  Stage III to stage IV chronic kidney disease. The patient's baseline creatinine per chart review is 1.6-1.74. She has had a mild increase in her creatinine over the past 24 hours, likely secondary to IV Lasix. We'll continue to monitor and decrease Lasix as above.  Chronic depression with anxiety/dementia The patient appears to be on multiple medications including Wellbutrin, Klonopin, Paxil, Risperdal, and Lamictal. Clonazepam is being held. Will restart when necessary.  Will transfer patient out of the stepdown unit and order PT evaluation.     Time spent: 35 minutes    [facility]  [facility] Pager [phone]. If 7PM-7AM, please contact night-coverage at [email or web address], password [facility] 08/20/2014, 8:14 AM  LOS: 1 day

## 2014-08-20 NOTE — Plan of Care (Signed)
Problem: Phase I Progression Outcomes Goal: OOB as tolerated unless otherwise ordered Outcome: Not Progressing PT C/O BEING TOO WEAK TO TURN OR GET OOB Goal: Initial discharge plan identified Outcome: Progressing PT WILL BE GOING BACK TO SNF AT DISCHARGE

## 2014-08-21 DIAGNOSIS — T68XXXD Hypothermia, subsequent encounter: Secondary | ICD-10-CM

## 2014-08-21 DIAGNOSIS — E162 Hypoglycemia, unspecified: Principal | ICD-10-CM

## 2014-08-21 DIAGNOSIS — S301XXD Contusion of abdominal wall, subsequent encounter: Secondary | ICD-10-CM

## 2014-08-21 DIAGNOSIS — F0390 Unspecified dementia without behavioral disturbance: Secondary | ICD-10-CM

## 2014-08-21 LAB — BASIC METABOLIC PANEL
Anion gap: 8 (ref 5–15)
BUN: 37 mg/dL — AB (ref 6–23)
CALCIUM: 9.8 mg/dL (ref 8.4–10.5)
CHLORIDE: 107 mmol/L (ref 96–112)
CO2: 28 mmol/L (ref 19–32)
Creatinine, Ser: 1.86 mg/dL — ABNORMAL HIGH (ref 0.50–1.10)
GFR calc non Af Amer: 25 mL/min — ABNORMAL LOW (ref >90)
GFR, EST AFRICAN AMERICAN: 29 mL/min — AB (ref >90)
Glucose, Bld: 88 mg/dL (ref 70–99)
Potassium: 4.1 mmol/L (ref 3.5–5.1)
Sodium: 143 mmol/L (ref 135–145)

## 2014-08-21 LAB — CBC
HCT: 31.1 % — ABNORMAL LOW (ref 36.0–46.0)
Hemoglobin: 9.5 g/dL — ABNORMAL LOW (ref 12.0–15.0)
MCH: 29.4 pg (ref 26.0–34.0)
MCHC: 30.5 g/dL (ref 30.0–36.0)
MCV: 96.3 fL (ref 78.0–100.0)
Platelets: 152 10*3/uL (ref 150–400)
RBC: 3.23 MIL/uL — ABNORMAL LOW (ref 3.87–5.11)
RDW: 17.1 % — AB (ref 11.5–15.5)
WBC: 4.9 10*3/uL (ref 4.0–10.5)

## 2014-08-21 LAB — CORTISOL-AM, BLOOD: Cortisol - AM: 16.2 ug/dL (ref 4.3–22.4)

## 2014-08-21 LAB — GLUCOSE, CAPILLARY
GLUCOSE-CAPILLARY: 79 mg/dL (ref 70–99)
GLUCOSE-CAPILLARY: 95 mg/dL (ref 70–99)
GLUCOSE-CAPILLARY: 97 mg/dL (ref 70–99)
Glucose-Capillary: 97 mg/dL (ref 70–99)
Glucose-Capillary: 81 mg/dL (ref 70–99)
Glucose-Capillary: 102 mg/dL — ABNORMAL HIGH (ref 70–99)

## 2014-08-21 LAB — FERRITIN: FERRITIN: 58 ng/mL (ref 10–291)

## 2014-08-21 LAB — VITAMIN B12: Vitamin B-12: 1437 pg/mL — ABNORMAL HIGH (ref 211–911)

## 2014-08-21 LAB — T4, FREE: Free T4: 0.97 ng/dL (ref 0.80–1.80)

## 2014-08-21 LAB — IRON AND TIBC
Iron: 118 ug/dL (ref 42–145)
Saturation Ratios: 35 % (ref 20–55)
TIBC: 338 ug/dL (ref 250–470)
UIBC: 220 ug/dL (ref 125–400)

## 2014-08-21 MED ORDER — INSULIN ASPART 100 UNIT/ML ~~LOC~~ SOLN: 0.0000 [IU] | Freq: Three times a day (TID) | SUBCUTANEOUS | Status: DC

## 2014-08-21 NOTE — Progress Notes (Signed)
[facility] PROGRESS NOTE  Amy Clay:[id number] DOB: [birth year] DOA: 08/18/2014 PCP: Colette Ribas, MD    Code Status: DNR Family Communication: Discussed with sister over the phone. Disposition Plan: Patient is from [facility] and may need a higher level of care. Physical therapy has been consulted.   Consultants:  None  Procedures:  2-D echocardiogram:  Study Conclusions - Left ventricle: The cavity size was normal. Wall thickness was increased in a pattern of mild LVH. Systolic function was normal. The estimated ejection fraction was in the range of 60% to 65%. Wall motion was normal; there were no regional wall motion abnormalities. The study is not technically sufficient to allow evaluation of LV diastolic function. - Aortic valve: Mildly calcified annulus. Trileaflet; mildly thickened leaflets. Valve area (VTI): 2.24 cm^2. Valve area (Vmax): 2.04 cm^2. - Mitral valve: Mildly calcified annulus. Normal thickness leaflets . - Technically adequate study.  Antibiotics:  None  HPI/Subjective: The patient is sitting up in chair. Says she does not feel well and that it is too hot in the room. She has to move her bowels.  Objective: Filed Vitals:   08/21/14 0500  BP: 159/78  Pulse: 84  Temp: 98 F (36.7 C)  Resp: 20   pulse 86. Risk to rate 19. Blood pressure 139/71. Oxygen saturation 95% on room air.  Intake/Output Summary (Last 24 hours) at 08/21/14 1045 Last data filed at 08/20/14 1800  Gross per 24 hour  Intake    480 ml  Output      0 ml  Net    480 ml   Filed Weights   08/19/14 0005 08/19/14 0345 08/20/14 0500  Weight: 102.059 kg (225 lb) 114.9 kg (253 lb 4.9 oz) 113.4 kg (250 lb)    Exam:   General:  Sitting up in chair, no acute distress.  Cardiovascular: S1, S2, with a soft systolic murmur.  Respiratory: Clear anteriorly with decreased breath sounds in bases.  Abdomen: Mild ecchymosis right abdominal wall  with mild tenderness; no appreciable mass nodule or fluctuance palpated. Bowel sounds present.  Musculoskeletal/extremities: No acute hot red joints. Multiple lower extremity varicosities. Trace to 1+ bilateral lower extremity nonpitting edema.  Neurologic: She is alert and oriented to herself and hospital. Her speech is clear. Cranial nerves II through XII are grossly intact.   Data Reviewed: Basic Metabolic Panel:  Recent Labs Lab 08/19/14 0036 08/19/14 0538 08/19/14 1549 08/20/14 0508 08/21/14 0619  NA 140 143  --  141 143  K 5.9* 5.8* 5.4* 5.0 4.1  CL 107 110  --  106 107  CO2 28 27  --  27 28  GLUCOSE 72 89  --  75 88  BUN 55* 54*  --  47* 37*  CREATININE 1.64* 1.53*  --  1.82* 1.86*  CALCIUM 9.6 9.8  --  9.6 9.8   Liver Function Tests:  Recent Labs Lab 08/19/14 0036 08/19/14 0538  AST 44* 45*  ALT 54* 54*  ALKPHOS 134* 132*  BILITOT 0.3 0.3  PROT 6.4 6.3  ALBUMIN 3.2* 3.1*   No results for input(s): LIPASE, AMYLASE in the last 168 hours. No results for input(s): AMMONIA in the last 168 hours. CBC:  Recent Labs Lab 08/19/14 0036 08/19/14 0538 08/20/14 0508 08/21/14 0619  WBC 4.5 4.0 4.6 4.9  NEUTROABS 3.1  --   --   --   HGB 9.1* 8.9* 9.4* 9.5*  HCT 30.1* 29.8* 31.6* 31.1*  MCV 98.0 97.4 96.6 96.3  PLT  132* 143* 162 152   Cardiac Enzymes: No results for input(s): CKTOTAL, CKMB, CKMBINDEX, TROPONINI in the last 168 hours. BNP (last 3 results) No results for input(s): BNP in the last 8760 hours.  ProBNP (last 3 results) No results for input(s): PROBNP in the last 8760 hours.  CBG:  Recent Labs Lab 08/20/14 1645 08/20/14 1952 08/21/14 0209 08/21/14 0445 08/21/14 0739  GLUCAP 86 97 97 81 79    Recent Results (from the past 240 hour(s))  Culture, blood (routine x 2)     Status: None (Preliminary result)   Collection Time: 08/19/14 12:36 AM  Result Value Ref Range Status   Specimen Description BLOOD RIGHT ANTECUBITAL  Final   Special  Requests BOTTLES DRAWN AEROBIC AND ANAEROBIC 10CC EACH  Final   Culture NO GROWTH 1 DAY  Final   Report Status PENDING  Incomplete  Culture, blood (routine x 2)     Status: None (Preliminary result)   Collection Time: 08/19/14 12:50 AM  Result Value Ref Range Status   Specimen Description BLOOD LEFT HAND  Final   Special Requests   Final    BOTTLES DRAWN AEROBIC AND ANAEROBIC AEB 10CC ANA 5CC   Culture NO GROWTH 1 DAY  Final   Report Status PENDING  Incomplete  MRSA PCR Screening     Status: None   Collection Time: 08/19/14  3:35 AM  Result Value Ref Range Status   MRSA by PCR NEGATIVE NEGATIVE Final    Comment:        The GeneXpert MRSA Assay (FDA approved for NASAL specimens only), is one component of a comprehensive MRSA colonization surveillance program. It is not intended to diagnose MRSA infection nor to guide or monitor treatment for MRSA infections.      Studies: Ct Abdomen Pelvis Wo Contrast  08/19/2014   CLINICAL DATA:  Abdominal wall bruising  EXAM: CT ABDOMEN AND PELVIS WITHOUT CONTRAST  TECHNIQUE: Multidetector CT imaging of the abdomen and pelvis was performed following the standard protocol without IV contrast.  COMPARISON:  None.  FINDINGS: Lung bases are free of acute infiltrate or sizable effusion.  The liver, spleen, adrenal glands and pancreas are within normal limits.  Kidneys are well visualized bilaterally. No renal calculi or urinary tract obstructive changes are seen. Mild aortoiliac calcifications are noted. The bladder is significantly distended without filling defect. A fat containing umbilical hernia is noted.  There are changes in the subcutaneous tissues in the lateral abdominal wall bilaterally. On the right however there is a focal fluid collection which is incompletely evaluated on this exam. It measures at least 13 cm in AP dimension. The transverse measurement is approximately 3 cm although the far lateral aspect of this collection is not well  visualized. Given the patient's history of bruising this likely represents a subcutaneous hematoma. This may be related to recent fall no fractures are seen. Degenerative change of the lumbar spine is noted. Fecal material is noted throughout the colon.  IMPRESSION: Changes in the right lateral abdominal wall consistent with focal hematoma likely related to a fall.  No other acute abnormality is noted.   Electronically Signed   By: Alcide Clever M.D.   On: 08/19/2014 14:36    Scheduled Meds: . antiseptic oral rinse  7 mL Mouth Rinse BID  . aspirin EC  81 mg Oral Daily  . benztropine  0.5 mg Oral BID  . buPROPion  300 mg Oral Daily  . darifenacin  15 mg Oral Daily  .  furosemide  20 mg Oral Daily  . lamoTRIgine  100 mg Oral BID  . pantoprazole  40 mg Oral Daily  . PARoxetine  40 mg Oral QHS  . risperiDONE  0.25 mg Oral TID  . risperiDONE  1 mg Oral QHS   Continuous Infusions:   Assessment and plan: Principal Problem:   Altered mental status Active Problems:   Major depression   Acute on chronic renal failure   Dementia   Benign essential HTN   Altered mental state   CKD (chronic kidney disease) stage 3, GFR 30-59 ml/min   Bilateral lower extremity edema   Hypothermia   Hematoma of abdominal wall   Hypoglycemia   Morbid obesity   Thrombocytopenia    1. Acute encephalopathy/altered mental status. Currently, this appears to be resolved. She is alert and oriented to herself and hospital. She follows directions well. Etiology possibly secondary to polypharmacy and/or metabolic arrangements including hypothermia and hypoglycemia. Although not recorded in the notes, it is likely she has had a recent fall which resulted in a right abdominal wall hematoma. Her chest x-ray and urinalysis are not indicative of infection. CT revealed no obvious sign of intra-abdominal infection. Continue supportive treatment.  Right lateral abdominal wall hematoma. Although not recorded, it is likely that  patient had a recent fall at the nursing facility which resulted in a focal hematoma, as recorded by CT and abdomen. Patient was anemic on admission, hemoglobin has appeared to remain stable. Using SCDs for DVT prophylaxis.  Hypothermia. Etiology unknown, but may have been secondary to an undocumented fall at the nursing facility. She has no signs of sepsis. Blood cultures are negative to date. Thyroid studies unremarkable. Cortisol level and process Resolved, status post body warmer/bear hugger  Hypoglycemia. Etiology unclear, but may be secondary to fall. No recorded history of diabetes. Thyroid function tests in normal range. Cortisol and hemoglobin A1c in process..  Anemia and thrombocytopenia. Total iron, ferritin, B-12 and thyroid function tests are within normal range. Possibly related to chronic disease. Thrombocytopenia has resolved.  Bilateral lower extremity edema. Etiology may be secondary to multiple varicose veins and venous insufficiency. 2-D echocardiogram not diagnostic of diastolic dysfunction; but her ejection fraction was noted to be 60-65% so doubt systolic heart failure. IV Lasix was started with improvement. She is now on by mouth Lasix and SCDs which will help.  Stage III to stage IV chronic kidney disease. The patient's baseline creatinine per chart review is 1.6-1.74. She had a mild increase in her creatinine, likely secondary to IV Lasix. We'll continue to monitor. Appears stable at this time  Chronic depression with anxiety/dementia The patient appears to be on multiple medications including Wellbutrin, Klonopin, Paxil, Risperdal, and Lamictal. Clonazepam is being held. Will restart when necessary.  Time spent: 35 minutes    [facility],Amy Strauch  [facility] Pager [phone]. If 7PM-7AM, please contact night-coverage at [email or web address], password [email or web address] 08/21/2014, 10:45 AM  LOS: 2 days

## 2014-08-22 DIAGNOSIS — F068 Other specified mental disorders due to known physiological condition: Secondary | ICD-10-CM | POA: Diagnosis not present

## 2014-08-22 DIAGNOSIS — L89893 Pressure ulcer of other site, stage 3: Secondary | ICD-10-CM | POA: Diagnosis not present

## 2014-08-22 DIAGNOSIS — M79675 Pain in left toe(s): Secondary | ICD-10-CM | POA: Diagnosis not present

## 2014-08-22 DIAGNOSIS — F419 Anxiety disorder, unspecified: Secondary | ICD-10-CM | POA: Diagnosis not present

## 2014-08-22 DIAGNOSIS — Z23 Encounter for immunization: Secondary | ICD-10-CM | POA: Diagnosis not present

## 2014-08-22 DIAGNOSIS — F331 Major depressive disorder, recurrent, moderate: Secondary | ICD-10-CM | POA: Diagnosis not present

## 2014-08-22 DIAGNOSIS — I82409 Acute embolism and thrombosis of unspecified deep veins of unspecified lower extremity: Secondary | ICD-10-CM | POA: Diagnosis not present

## 2014-08-22 DIAGNOSIS — K219 Gastro-esophageal reflux disease without esophagitis: Secondary | ICD-10-CM | POA: Diagnosis not present

## 2014-08-22 DIAGNOSIS — M1990 Unspecified osteoarthritis, unspecified site: Secondary | ICD-10-CM | POA: Diagnosis not present

## 2014-08-22 DIAGNOSIS — R609 Edema, unspecified: Secondary | ICD-10-CM | POA: Diagnosis not present

## 2014-08-22 DIAGNOSIS — M7989 Other specified soft tissue disorders: Secondary | ICD-10-CM | POA: Diagnosis not present

## 2014-08-22 DIAGNOSIS — M79661 Pain in right lower leg: Secondary | ICD-10-CM | POA: Diagnosis present

## 2014-08-22 DIAGNOSIS — R748 Abnormal levels of other serum enzymes: Secondary | ICD-10-CM | POA: Diagnosis not present

## 2014-08-22 DIAGNOSIS — L89603 Pressure ulcer of unspecified heel, stage 3: Secondary | ICD-10-CM | POA: Diagnosis not present

## 2014-08-22 DIAGNOSIS — R600 Localized edema: Secondary | ICD-10-CM | POA: Diagnosis not present

## 2014-08-22 DIAGNOSIS — R4182 Altered mental status, unspecified: Secondary | ICD-10-CM | POA: Diagnosis not present

## 2014-08-22 DIAGNOSIS — R05 Cough: Secondary | ICD-10-CM | POA: Diagnosis not present

## 2014-08-22 DIAGNOSIS — Z7901 Long term (current) use of anticoagulants: Secondary | ICD-10-CM | POA: Diagnosis not present

## 2014-08-22 DIAGNOSIS — Y9289 Other specified places as the place of occurrence of the external cause: Secondary | ICD-10-CM | POA: Diagnosis not present

## 2014-08-22 DIAGNOSIS — L89623 Pressure ulcer of left heel, stage 3: Secondary | ICD-10-CM | POA: Diagnosis not present

## 2014-08-22 DIAGNOSIS — Z9981 Dependence on supplemental oxygen: Secondary | ICD-10-CM | POA: Diagnosis not present

## 2014-08-22 DIAGNOSIS — Z833 Family history of diabetes mellitus: Secondary | ICD-10-CM | POA: Diagnosis not present

## 2014-08-22 DIAGNOSIS — F319 Bipolar disorder, unspecified: Secondary | ICD-10-CM | POA: Diagnosis not present

## 2014-08-22 DIAGNOSIS — N179 Acute kidney failure, unspecified: Secondary | ICD-10-CM | POA: Diagnosis not present

## 2014-08-22 DIAGNOSIS — Y9389 Activity, other specified: Secondary | ICD-10-CM | POA: Diagnosis not present

## 2014-08-22 DIAGNOSIS — F0391 Unspecified dementia with behavioral disturbance: Secondary | ICD-10-CM | POA: Diagnosis not present

## 2014-08-22 DIAGNOSIS — F3130 Bipolar disorder, current episode depressed, mild or moderate severity, unspecified: Secondary | ICD-10-CM | POA: Diagnosis not present

## 2014-08-22 DIAGNOSIS — F5105 Insomnia due to other mental disorder: Secondary | ICD-10-CM | POA: Diagnosis not present

## 2014-08-22 DIAGNOSIS — I82402 Acute embolism and thrombosis of unspecified deep veins of left lower extremity: Secondary | ICD-10-CM | POA: Diagnosis not present

## 2014-08-22 DIAGNOSIS — Z792 Long term (current) use of antibiotics: Secondary | ICD-10-CM | POA: Diagnosis not present

## 2014-08-22 DIAGNOSIS — R279 Unspecified lack of coordination: Secondary | ICD-10-CM | POA: Diagnosis not present

## 2014-08-22 DIAGNOSIS — K5641 Fecal impaction: Secondary | ICD-10-CM | POA: Diagnosis not present

## 2014-08-22 DIAGNOSIS — M79605 Pain in left leg: Secondary | ICD-10-CM | POA: Diagnosis not present

## 2014-08-22 DIAGNOSIS — Z7982 Long term (current) use of aspirin: Secondary | ICD-10-CM | POA: Diagnosis not present

## 2014-08-22 DIAGNOSIS — R509 Fever, unspecified: Secondary | ICD-10-CM | POA: Diagnosis not present

## 2014-08-22 DIAGNOSIS — F99 Mental disorder, not otherwise specified: Secondary | ICD-10-CM | POA: Diagnosis not present

## 2014-08-22 DIAGNOSIS — F489 Nonpsychotic mental disorder, unspecified: Secondary | ICD-10-CM | POA: Diagnosis not present

## 2014-08-22 DIAGNOSIS — Z825 Family history of asthma and other chronic lower respiratory diseases: Secondary | ICD-10-CM | POA: Diagnosis not present

## 2014-08-22 DIAGNOSIS — R1311 Dysphagia, oral phase: Secondary | ICD-10-CM | POA: Diagnosis not present

## 2014-08-22 DIAGNOSIS — S8992XA Unspecified injury of left lower leg, initial encounter: Secondary | ICD-10-CM | POA: Diagnosis present

## 2014-08-22 DIAGNOSIS — D72829 Elevated white blood cell count, unspecified: Secondary | ICD-10-CM | POA: Diagnosis not present

## 2014-08-22 DIAGNOSIS — E46 Unspecified protein-calorie malnutrition: Secondary | ICD-10-CM | POA: Diagnosis not present

## 2014-08-22 DIAGNOSIS — M129 Arthropathy, unspecified: Secondary | ICD-10-CM | POA: Diagnosis not present

## 2014-08-22 DIAGNOSIS — S81802A Unspecified open wound, left lower leg, initial encounter: Secondary | ICD-10-CM | POA: Diagnosis not present

## 2014-08-22 DIAGNOSIS — I10 Essential (primary) hypertension: Secondary | ICD-10-CM | POA: Diagnosis not present

## 2014-08-22 DIAGNOSIS — R0902 Hypoxemia: Secondary | ICD-10-CM | POA: Diagnosis not present

## 2014-08-22 DIAGNOSIS — Y998 Other external cause status: Secondary | ICD-10-CM | POA: Diagnosis not present

## 2014-08-22 DIAGNOSIS — B351 Tinea unguium: Secondary | ICD-10-CM | POA: Diagnosis not present

## 2014-08-22 DIAGNOSIS — S301XXA Contusion of abdominal wall, initial encounter: Secondary | ICD-10-CM | POA: Diagnosis not present

## 2014-08-22 DIAGNOSIS — K648 Other hemorrhoids: Secondary | ICD-10-CM | POA: Diagnosis not present

## 2014-08-22 DIAGNOSIS — E7800 Pure hypercholesterolemia, unspecified: Secondary | ICD-10-CM | POA: Diagnosis not present

## 2014-08-22 DIAGNOSIS — R680 Hypothermia, not associated with low environmental temperature: Secondary | ICD-10-CM | POA: Diagnosis not present

## 2014-08-22 DIAGNOSIS — Z79899 Other long term (current) drug therapy: Secondary | ICD-10-CM | POA: Diagnosis not present

## 2014-08-22 DIAGNOSIS — Z880 Allergy status to penicillin: Secondary | ICD-10-CM | POA: Diagnosis not present

## 2014-08-22 DIAGNOSIS — L84 Corns and callosities: Secondary | ICD-10-CM | POA: Diagnosis not present

## 2014-08-22 DIAGNOSIS — N189 Chronic kidney disease, unspecified: Secondary | ICD-10-CM | POA: Diagnosis not present

## 2014-08-22 DIAGNOSIS — R6889 Other general symptoms and signs: Secondary | ICD-10-CM | POA: Diagnosis not present

## 2014-08-22 DIAGNOSIS — F0280 Dementia in other diseases classified elsewhere without behavioral disturbance: Secondary | ICD-10-CM | POA: Diagnosis not present

## 2014-08-22 DIAGNOSIS — D696 Thrombocytopenia, unspecified: Secondary | ICD-10-CM | POA: Diagnosis not present

## 2014-08-22 DIAGNOSIS — Z743 Need for continuous supervision: Secondary | ICD-10-CM | POA: Diagnosis not present

## 2014-08-22 DIAGNOSIS — Z8719 Personal history of other diseases of the digestive system: Secondary | ICD-10-CM | POA: Diagnosis not present

## 2014-08-22 DIAGNOSIS — R21 Rash and other nonspecific skin eruption: Secondary | ICD-10-CM | POA: Diagnosis not present

## 2014-08-22 DIAGNOSIS — E162 Hypoglycemia, unspecified: Secondary | ICD-10-CM | POA: Diagnosis not present

## 2014-08-22 DIAGNOSIS — L02419 Cutaneous abscess of limb, unspecified: Secondary | ICD-10-CM | POA: Diagnosis not present

## 2014-08-22 DIAGNOSIS — R262 Difficulty in walking, not elsewhere classified: Secondary | ICD-10-CM | POA: Diagnosis not present

## 2014-08-22 DIAGNOSIS — I82411 Acute embolism and thrombosis of right femoral vein: Secondary | ICD-10-CM | POA: Diagnosis not present

## 2014-08-22 DIAGNOSIS — R2681 Unsteadiness on feet: Secondary | ICD-10-CM | POA: Diagnosis not present

## 2014-08-22 DIAGNOSIS — R278 Other lack of coordination: Secondary | ICD-10-CM | POA: Diagnosis not present

## 2014-08-22 DIAGNOSIS — L89613 Pressure ulcer of right heel, stage 3: Secondary | ICD-10-CM | POA: Diagnosis not present

## 2014-08-22 DIAGNOSIS — M6281 Muscle weakness (generalized): Secondary | ICD-10-CM | POA: Diagnosis not present

## 2014-08-22 DIAGNOSIS — E669 Obesity, unspecified: Secondary | ICD-10-CM | POA: Diagnosis not present

## 2014-08-22 DIAGNOSIS — L03116 Cellulitis of left lower limb: Secondary | ICD-10-CM | POA: Diagnosis not present

## 2014-08-22 DIAGNOSIS — E785 Hyperlipidemia, unspecified: Secondary | ICD-10-CM | POA: Diagnosis not present

## 2014-08-22 DIAGNOSIS — J159 Unspecified bacterial pneumonia: Secondary | ICD-10-CM | POA: Diagnosis not present

## 2014-08-22 DIAGNOSIS — E559 Vitamin D deficiency, unspecified: Secondary | ICD-10-CM | POA: Diagnosis not present

## 2014-08-22 DIAGNOSIS — K5900 Constipation, unspecified: Secondary | ICD-10-CM | POA: Diagnosis not present

## 2014-08-22 DIAGNOSIS — Z6841 Body Mass Index (BMI) 40.0 and over, adult: Secondary | ICD-10-CM | POA: Diagnosis not present

## 2014-08-22 DIAGNOSIS — F339 Major depressive disorder, recurrent, unspecified: Secondary | ICD-10-CM | POA: Diagnosis not present

## 2014-08-22 DIAGNOSIS — I129 Hypertensive chronic kidney disease with stage 1 through stage 4 chronic kidney disease, or unspecified chronic kidney disease: Secondary | ICD-10-CM | POA: Diagnosis not present

## 2014-08-22 DIAGNOSIS — R5381 Other malaise: Secondary | ICD-10-CM | POA: Diagnosis not present

## 2014-08-22 DIAGNOSIS — L97229 Non-pressure chronic ulcer of left calf with unspecified severity: Secondary | ICD-10-CM | POA: Diagnosis not present

## 2014-08-22 DIAGNOSIS — R319 Hematuria, unspecified: Secondary | ICD-10-CM | POA: Diagnosis not present

## 2014-08-22 DIAGNOSIS — L03119 Cellulitis of unspecified part of limb: Secondary | ICD-10-CM | POA: Diagnosis not present

## 2014-08-22 DIAGNOSIS — Z7401 Bed confinement status: Secondary | ICD-10-CM | POA: Diagnosis not present

## 2014-08-22 DIAGNOSIS — F341 Dysthymic disorder: Secondary | ICD-10-CM | POA: Diagnosis not present

## 2014-08-22 DIAGNOSIS — L02416 Cutaneous abscess of left lower limb: Secondary | ICD-10-CM | POA: Diagnosis not present

## 2014-08-22 DIAGNOSIS — T148 Other injury of unspecified body region: Secondary | ICD-10-CM | POA: Diagnosis not present

## 2014-08-22 DIAGNOSIS — Z741 Need for assistance with personal care: Secondary | ICD-10-CM | POA: Diagnosis not present

## 2014-08-22 DIAGNOSIS — F0390 Unspecified dementia without behavioral disturbance: Secondary | ICD-10-CM | POA: Diagnosis not present

## 2014-08-22 DIAGNOSIS — S81812A Laceration without foreign body, left lower leg, initial encounter: Secondary | ICD-10-CM | POA: Diagnosis not present

## 2014-08-22 DIAGNOSIS — R300 Dysuria: Secondary | ICD-10-CM | POA: Diagnosis not present

## 2014-08-22 DIAGNOSIS — E1151 Type 2 diabetes mellitus with diabetic peripheral angiopathy without gangrene: Secondary | ICD-10-CM | POA: Diagnosis not present

## 2014-08-22 DIAGNOSIS — R404 Transient alteration of awareness: Secondary | ICD-10-CM | POA: Diagnosis not present

## 2014-08-22 DIAGNOSIS — N183 Chronic kidney disease, stage 3 (moderate): Secondary | ICD-10-CM | POA: Diagnosis not present

## 2014-08-22 DIAGNOSIS — R531 Weakness: Secondary | ICD-10-CM | POA: Diagnosis not present

## 2014-08-22 DIAGNOSIS — N329 Bladder disorder, unspecified: Secondary | ICD-10-CM | POA: Diagnosis not present

## 2014-08-22 DIAGNOSIS — D649 Anemia, unspecified: Secondary | ICD-10-CM | POA: Diagnosis not present

## 2014-08-22 DIAGNOSIS — W06XXXA Fall from bed, initial encounter: Secondary | ICD-10-CM | POA: Diagnosis not present

## 2014-08-22 DIAGNOSIS — N390 Urinary tract infection, site not specified: Secondary | ICD-10-CM | POA: Diagnosis not present

## 2014-08-22 DIAGNOSIS — R41841 Cognitive communication deficit: Secondary | ICD-10-CM | POA: Diagnosis not present

## 2014-08-22 DIAGNOSIS — M79674 Pain in right toe(s): Secondary | ICD-10-CM | POA: Diagnosis not present

## 2014-08-22 LAB — BASIC METABOLIC PANEL
Anion gap: 9 (ref 5–15)
BUN: 33 mg/dL — ABNORMAL HIGH (ref 6–23)
CALCIUM: 9.6 mg/dL (ref 8.4–10.5)
CHLORIDE: 107 mmol/L (ref 96–112)
CO2: 27 mmol/L (ref 19–32)
Creatinine, Ser: 1.69 mg/dL — ABNORMAL HIGH (ref 0.50–1.10)
GFR calc Af Amer: 33 mL/min — ABNORMAL LOW (ref >90)
GFR, EST NON AFRICAN AMERICAN: 28 mL/min — AB (ref >90)
Glucose, Bld: 94 mg/dL (ref 70–99)
POTASSIUM: 3.9 mmol/L (ref 3.5–5.1)
Sodium: 143 mmol/L (ref 135–145)

## 2014-08-22 LAB — GLUCOSE, CAPILLARY
GLUCOSE-CAPILLARY: 77 mg/dL (ref 70–99)
Glucose-Capillary: 87 mg/dL (ref 70–99)

## 2014-08-22 LAB — HEMOGLOBIN A1C
Hgb A1c MFr Bld: 5.5 % (ref 4.8–5.6)
Mean Plasma Glucose: 111 mg/dL

## 2014-08-22 MED ORDER — CLONAZEPAM 0.5 MG PO TABS: 0.5000 mg | ORAL_TABLET | Freq: Two times a day (BID) | ORAL | Status: DC | PRN

## 2014-08-22 NOTE — Clinical Social Work Placement (Signed)
Clinical Social Work Department CLINICAL SOCIAL WORK PLACEMENT NOTE 08/22/2014  Patient:  Amy Clay,[name]  Account Number:  [id number] Admit date:  08/18/2014  Clinical Social Worker:  Tretha SciaraHEATHER Dexton Zwilling, LCSW  Date/time:  08/22/2014 12:23 PM  Clinical Social Work is seeking post-discharge placement for this patient at the following level of care:   SKILLED NURSING   (*CSW will update this form in Epic as items are completed)   08/22/2014  Patient/family provided with [facility] Department of Clinical Social Work's list of facilities offering this level of care within the geographic area requested by the patient (or if unable, by the patient's family).  08/22/2014  Patient/family informed of their freedom to choose among providers that offer the needed level of care, that participate in Medicare, Medicaid or managed care program needed by the patient, have an available bed and are willing to accept the patient.  08/22/2014  Patient/family informed of [facility]' ownership interest in [facility], as well as of the fact that they are under no obligation to receive care at this facility.  PASARR submitted to EDS on 08/22/2014 PASARR number received on 08/22/2014  FL2 transmitted to all facilities in geographic area requested by pt/family on  08/22/2014 FL2 transmitted to all facilities within larger geographic area on   Patient informed that his/her managed care company has contracts with or will negotiate with  certain facilities, including the following:     Patient/family informed of bed offers received:  08/22/2014 [facility] [place] Physician recommends and patient chooses bed at    [facility] [place] on  08/22/2014 Patient to be transferred to facility by [facility] Patient and family notified of transfer on 08/22/2014 Name of family member notified:  Ledora BottcherKay Pirozzi  The following physician request were entered in  Epic:   Additional Comments:   Tretha SciaraHeather Jusitn Salsgiver, LCSW [phone]

## 2014-08-22 NOTE — Clinical Social Work Note (Signed)
Pt, pt's sister Joyce GrossKay, and [facility] aware of d/c. [facility] D/C summary faxed.  Derenda FennelKara Shanaya Schneck, [place]CS[place] [phone]

## 2014-08-22 NOTE — Progress Notes (Signed)
Patient with orders to be discharge to [facility]. Report called to [facility], nurse. Discharge packet sent with patient. Patient stable. Patient transported via EMS.  

## 2014-08-22 NOTE — Progress Notes (Signed)
UR chart review completed.  

## 2014-08-22 NOTE — Evaluation (Signed)
Physical Therapy Evaluation Patient Details Name: Amy Clay MRN: [id number] DOB: [birth year] Today's Date: 08/22/2014   History of Present Illness  74 year old female who  has a past medical history of High blood pressure; Arthritis; Depression; Anxiety; Hemorrhoids; Hyperlipidemia; and Bipolar 1 disorder. patient was brought to the emergency department for new onset of altered mental status for past 3 days. Patient's sister is at bedside who says that patient has been lethargic and sleeping more than usual for past 2 weeks. She currently resides at [facility]. Patient is lethargic but arousable, she is alert oriented 2 to place and person. She denies chest pain, no shortness of breath no nausea vomiting or diarrhea.  Clinical Impression   Pt was seen for evaluation.  She was quite lethargic but able to follow simple directions.  Sister states that he mobility has gradually decreased and she has had frequent falls.  She is now very afraid of being upright due to fear of falling.  She is found to be very weak and deconditioned, weaker in the LLE than the RLE.  I was unable to transfer her to sitting at EOB due to her inability to assist and morbid obesity.  I am recommending SNF at d/c to at least assist pt is developing the ability to transfer  Bed to chair.    Follow Up Recommendations SNF    Equipment Recommendations  None recommended by PT    Recommendations for Other Services   OT    Precautions / Restrictions Precautions Precautions: Fall Restrictions Weight Bearing Restrictions: No      Mobility  Bed Mobility Overal bed mobility: Needs Assistance Bed Mobility: Supine to Sit     Supine to sit: Total assist;HOB elevated     General bed mobility comments: therapist was unable to postion pt in sitting at EOB even with total assist due to her morbid obesity...she will need a mechanical lift for transfers  Transfers                 General  transfer comment: Maxi move will be needed for transfers  Ambulation/Gait   unable                                 Balance Overall balance assessment:  (unable to assess)                                           Pertinent Vitals/Pain Pain Assessment: No/denies pain    Home Living Family/patient expects to be discharged to:: Assisted living               Home Equipment: Walker - 4 wheels;Cane - quad;Grab bars - tub/shower;Grab bars - toilet      Prior Function Level of Independence: Needs assistance   Gait / Transfers Assistance Needed: not functionally ambulatory, needs assist with transfers  ADL's / Homemaking Assistance Needed: assist with all grooming  Comments: pt has been using a w/c for mobility due to frequent falls             Extremity/Trunk Assessment   Upper Extremity Assessment: Overall WFL for tasks assessed           Lower Extremity Assessment: Overall WFL for tasks assessed      Cervical / Trunk Assessment: Kyphotic  Communication  Communication: No difficulties  Cognition Arousal/Alertness: Lethargic Behavior During Therapy: Flat affect Overall Cognitive Status: History of cognitive impairments - at baseline                                    Assessment/Plan    PT Assessment All further PT needs can be met in the next venue of care  PT Diagnosis     PT Problem List Decreased strength;Decreased activity tolerance;Decreased mobility;Cardiopulmonary status limiting activity;Obesity  PT Treatment Interventions     PT Goals (Current goals can be found in the Care Plan section) Acute Rehab PT Goals PT Goal Formulation: All assessment and education complete, DC therapy         Barriers to discharge  none                     End of Session Equipment Utilized During Treatment: Gait belt Activity Tolerance: Patient limited by fatigue Patient left: in bed;with call bell/phone  within reach;with bed alarm set;with family/visitor present Nurse Communication: Mobility status;Need for lift equipment         Time: 4163-8453 PT Time Calculation (min) (ACUTE ONLY): 26 min   Charges:   PT Evaluation $Initial PT Evaluation Tier I: 1 Procedure           Sable Feil 08/22/2014, 11:46 AM

## 2014-08-22 NOTE — Discharge Summary (Addendum)
Physician Discharge Summary  Amy Clay:[id number] DOB: 26-Apr-1941 DOA: 08/18/2014  PCP: Colette Ribas, MD  Admit date: 08/18/2014 Discharge date: 08/22/2014  Time spent:  Recommendations for Outpatient Follow-up:  1. Patient will be discharged to [facility] for physical therapy  Discharge Diagnoses:  Principal Problem:   Metabolic encephalopathy, possibly related to hypoglycemia versus hypothermia Active Problems:   Major depression   Acute on chronic renal failure   Dementia   Benign essential HTN   Altered mental state   CKD (chronic kidney disease) stage 3, GFR 30-59 ml/min   Bilateral lower extremity edema   Hypothermia   Hematoma of abdominal wall   Hypoglycemia   Morbid obesity   Thrombocytopenia   Discharge Condition: improved  Diet recommendation: low salt  Filed Weights   08/19/14 0005 08/19/14 0345 08/20/14 0500  Weight: 102.059 kg (225 lb) 114.9 kg (253 lb 4.9 oz) 113.4 kg (250 lb)    History of present illness:  This patient was admitted to the hospital with altered mental status. Patient had worsening lethargy proximally 3 days prior to admission. She has dementia and is chronically confused, but appeared to be more lethargic. Workup in the emergency room revealed hypothermia with temperature of 94.5, hypoglycemia as well as mild renal insufficiency. Lactic acid was normal.  Hospital Course:  Patient was previously subtended and started on warm blankets. Blood cultures were sent that did not show any significant growth. She did not have any clear source of infection and therefore antibiotics were not started. She did receive intravenous hydration.urine culture and blood cultures did not show significant growth. Her hypothermia did resolve. It is possible that she had an undocumented fall which could've led to her hypothermia. Thyroid studies and cortisol were normal. She was noted to have aright lateral abdominal wall hematoma. This could  also be possibly explained by a fall. Patient's hypoglycemia has also resolved. A1c was checked and found to be 5.5. She is back to her baseline mental state which is chronically confused. She was also noted to have bilateral lower extremity edema. This was felt to be related to possible venous insufficiency. Echocardiogram was checked and noted normal LV function. She was treated with intravenous Lasix and transitioned back to her home dose of oral Lasix. Lower extremity edema has significantly improved. Creatinine appears to be at baseline despite diuresis. She was evaluated by physical therapy who recommended the patient return to skilled nursing facility. Patient is stable for discharge today.  Procedures:  2-D echocardiogram: Study Conclusions - Left ventricle: The cavity size was normal. Wall thickness was increased in a pattern of mild LVH. Systolic function was normal. The estimated ejection fraction was in the range of 60% to 65%. Wall motion was normal; there were no regional wall motion abnormalities. The study is not technically sufficient to allow evaluation of LV diastolic function. - Aortic valve: Mildly calcified annulus. Trileaflet; mildly thickened leaflets. Valve area (VTI): 2.24 cm^2. Valve area (Vmax): 2.04 cm^2. - Mitral valve: Mildly calcified annulus. Normal thickness leaflets . - Technically adequate study  Consultations:    Discharge Exam: Filed Vitals:   08/22/14 1328  BP: 102/48  Pulse: 81  Temp: 98.7 F (37.1 C)  Resp: 20    General: NAD Cardiovascular: S1, S2 RRR Respiratory: CTA B  Discharge Instructions   Discharge Instructions    Diet - low sodium heart healthy    Complete by:  As directed      Increase activity slowly  Complete by:  As directed           Current Discharge Medication List    CONTINUE these medications which have NOT CHANGED   Details  acetaminophen (TYLENOL) 500 MG tablet Take 1,000 mg by mouth  every 6 (six) hours as needed for pain.    aspirin EC 81 MG tablet Take 81 mg by mouth every morning.     benztropine (COGENTIN) 0.5 MG tablet Take 1 tablet (0.5 mg total) by mouth 2 (two) times daily. Qty: 60 tablet, Refills: 2   Associated Diagnoses: Major depressive disorder, recurrent episode, moderate    !! beta carotene w/minerals (OCUVITE) tablet Take 1 tablet by mouth every morning.     buPROPion (WELLBUTRIN XL) 300 MG 24 hr tablet Take 1 tablet (300 mg total) by mouth every morning. Qty: 30 tablet, Refills: 2    calcium carbonate (OS-CAL) 600 MG TABS Take 1,200 mg by mouth daily with breakfast.     Cholecalciferol (VITAMIN D) 2000 UNITS CAPS Take 1 capsule by mouth every morning.     clonazePAM (KLONOPIN) 0.5 MG tablet Take 1 tablet (0.5 mg total) by mouth 2 (two) times daily as needed for anxiety. Qty: 60 tablet, Refills: 2    docusate sodium (STOOL SOFTENER) 100 MG capsule Take 200 mg by mouth at bedtime.    furosemide (LASIX) 20 MG tablet Take 20 mg by mouth every Monday, Wednesday, and Friday.     lamoTRIgine (LAMICTAL) 100 MG tablet Take 1 tablet (100 mg total) by mouth 2 (two) times daily. Qty: 60 tablet, Refills: 2   Associated Diagnoses: Major depressive disorder, recurrent episode, moderate    loratadine (CLARITIN) 10 MG tablet Take 10 mg by mouth daily as needed for allergies.    losartan (COZAAR) 100 MG tablet Take 100 mg by mouth every morning.     !! multivitamin (PROSIGHT) TABS tablet Take 1 tablet by mouth daily.    mupirocin ointment (BACTROBAN) 2 % Place 1 application into the nose 3 (three) times daily as needed (wound care).    omeprazole (PRILOSEC) 40 MG capsule Take 40 mg by mouth daily.    PARoxetine (PAXIL) 40 MG tablet Take 1 tablet (40 mg total) by mouth at bedtime. Qty: 30 tablet, Refills: 2    polyethylene glycol (MIRALAX / GLYCOLAX) packet Take 17 g by mouth daily as needed for mild constipation.    !! risperiDONE (RISPERDAL) 0.25 MG  tablet Take 1 tablet (0.25 mg total) by mouth 3 (three) times daily. Takes at 8:00AM, 2:00PM, 5:00PM. Qty: 90 tablet, Refills: 2    !! risperiDONE (RISPERDAL) 1 MG tablet Take 1 tablet (1 mg total) by mouth at bedtime. Qty: 30 tablet, Refills: 2   Associated Diagnoses: Major depressive disorder, recurrent episode, moderate; Insomnia due to mental disorder    solifenacin (VESICARE) 10 MG tablet Take 10 mg by mouth every morning.     terazosin (HYTRIN) 2 MG capsule Take 2 mg by mouth at bedtime.      !! - Potential duplicate medications found. Please discuss with provider.    STOP taking these medications     sulfamethoxazole-trimethoprim (SEPTRA DS) 800-160 MG per tablet        Allergies  Allergen Reactions  . Benzodiazepines Other (See Comments)    Memory problems on Ativan, which goes away when she is off of it.   . Ciprofloxacin     Rash at site of injection with itching  . Cephalexin Rash and Other (See Comments)  Blistering of the mouth  . Penicillins Rash      The results of significant diagnostics from this hospitalization (including imaging, microbiology, ancillary and laboratory) are listed below for reference.    Significant Diagnostic Studies: Ct Abdomen Pelvis Wo Contrast  08/19/2014   CLINICAL DATA:  Abdominal wall bruising  EXAM: CT ABDOMEN AND PELVIS WITHOUT CONTRAST  TECHNIQUE: Multidetector CT imaging of the abdomen and pelvis was performed following the standard protocol without IV contrast.  COMPARISON:  None.  FINDINGS: Lung bases are free of acute infiltrate or sizable effusion.  The liver, spleen, adrenal glands and pancreas are within normal limits.  Kidneys are well visualized bilaterally. No renal calculi or urinary tract obstructive changes are seen. Mild aortoiliac calcifications are noted. The bladder is significantly distended without filling defect. A fat containing umbilical hernia is noted.  There are changes in the subcutaneous tissues in the  lateral abdominal wall bilaterally. On the right however there is a focal fluid collection which is incompletely evaluated on this exam. It measures at least 13 cm in AP dimension. The transverse measurement is approximately 3 cm although the far lateral aspect of this collection is not well visualized. Given the patient's history of bruising this likely represents a subcutaneous hematoma. This may be related to recent fall no fractures are seen. Degenerative change of the lumbar spine is noted. Fecal material is noted throughout the colon.  IMPRESSION: Changes in the right lateral abdominal wall consistent with focal hematoma likely related to a fall.  No other acute abnormality is noted.   Electronically Signed   By: Alcide Clever M.D.   On: 08/19/2014 14:36   Ct Head Wo Contrast  08/19/2014   CLINICAL DATA:  Altered mental status, onset 3 days ago.  EXAM: CT HEAD WITHOUT CONTRAST  TECHNIQUE: Contiguous axial images were obtained from the base of the skull through the vertex without intravenous contrast.  COMPARISON:  03/12/2014  FINDINGS: Technically limited study due to motion artifact. Diffuse cerebral atrophy. Ventricular dilatation consistent with central atrophy. Low-attenuation changes throughout the deep and periventricular white matter consistent small vessel ischemia. Old focal areas of encephalomalacia in the posterior parietal lobes bilaterally. No mass effect or midline shift. No abnormal extra-axial fluid collections. Gray-white matter junctions are distinct. Basal cisterns are not effaced. No evidence of acute intracranial hemorrhage. No depressed skull fractures. Mucosal thickening in the paranasal sinuses. Mastoid air cells are not opacified. Vascular calcifications.  IMPRESSION: No acute intracranial abnormalities. 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   Electronically Signed   By: Burman Nieves M.D.   On: 08/19/2014 01:28   Dg Chest Port 1  View  08/19/2014   CLINICAL DATA:  New onset altered mental status for 3 days.  EXAM: PORTABLE CHEST - 1 VIEW  COMPARISON:  04/28/2014  FINDINGS: Shallow inspiration with elevation of the right hemidiaphragm. Normal heart size and pulmonary vascularity. No focal airspace disease or consolidation in the lungs. No blunting of costophrenic angles. No pneumothorax. Calcification of the aorta. Degenerative changes in the spine.  IMPRESSION: No active disease.   Electronically Signed   By: Burman Nieves M.D.   On: 08/19/2014 00:56    Microbiology: Recent Results (from the past 240 hour(s))  Culture, blood (routine x 2)     Status: None (Preliminary result)   Collection Time: 08/19/14 12:36 AM  Result Value Ref Range Status   Specimen Description BLOOD RIGHT ANTECUBITAL  Final   Special Requests BOTTLES DRAWN  AEROBIC AND ANAEROBIC 10CC EACH  Final   Culture NO GROWTH 3 DAYS  Final   Report Status PENDING  Incomplete  Culture, blood (routine x 2)     Status: None (Preliminary result)   Collection Time: 08/19/14 12:50 AM  Result Value Ref Range Status   Specimen Description BLOOD LEFT HAND  Final   Special Requests   Final    BOTTLES DRAWN AEROBIC AND ANAEROBIC AEB 10CC ANA 5CC   Culture NO GROWTH 3 DAYS  Final   Report Status PENDING  Incomplete  MRSA PCR Screening     Status: None   Collection Time: 08/19/14  3:35 AM  Result Value Ref Range Status   MRSA by PCR NEGATIVE NEGATIVE Final    Comment:        The GeneXpert MRSA Assay (FDA approved for NASAL specimens only), is one component of a comprehensive MRSA colonization surveillance program. It is not intended to diagnose MRSA infection nor to guide or monitor treatment for MRSA infections.      Labs: Basic Metabolic Panel:  Recent Labs Lab 08/19/14 0036 08/19/14 0538 08/19/14 1549 08/20/14 0508 08/21/14 0619 08/22/14 0627  NA 140 143  --  141 143 143  K 5.9* 5.8* 5.4* 5.0 4.1 3.9  CL 107 110  --  106 107 107  CO2 28  27  --  27 28 27   GLUCOSE 72 89  --  75 88 94  BUN 55* 54*  --  47* 37* 33*  CREATININE 1.64* 1.53*  --  1.82* 1.86* 1.69*  CALCIUM 9.6 9.8  --  9.6 9.8 9.6   Liver Function Tests:  Recent Labs Lab 08/19/14 0036 08/19/14 0538  AST 44* 45*  ALT 54* 54*  ALKPHOS 134* 132*  BILITOT 0.3 0.3  PROT 6.4 6.3  ALBUMIN 3.2* 3.1*   No results for input(s): LIPASE, AMYLASE in the last 168 hours. No results for input(s): AMMONIA in the last 168 hours. CBC:  Recent Labs Lab 08/19/14 0036 08/19/14 0538 08/20/14 0508 08/21/14 0619  WBC 4.5 4.0 4.6 4.9  NEUTROABS 3.1  --   --   --   HGB 9.1* 8.9* 9.4* 9.5*  HCT 30.1* 29.8* 31.6* 31.1*  MCV 98.0 97.4 96.6 96.3  PLT 132* 143* 162 152   Cardiac Enzymes: No results for input(s): CKTOTAL, CKMB, CKMBINDEX, TROPONINI in the last 168 hours. BNP: BNP (last 3 results) No results for input(s): BNP in the last 8760 hours.  ProBNP (last 3 results) No results for input(s): PROBNP in the last 8760 hours.  CBG:  Recent Labs Lab 08/21/14 1129 08/21/14 1623 08/21/14 2102 08/22/14 0733 08/22/14 1138  GLUCAP 95 102* 97 87 77       Signed:  Rhyen Mazariego  [facility] 08/22/2014, 2:23 PM

## 2014-08-22 NOTE — Clinical Social Work Note (Signed)
CSW met with patient. Patient's sister, Delma Drone, was at bedside. CSW discussed the recommendation for patient to go to SNF for rehab. Ms. Redlich indicated that her choices were [facility], [facility] and [facility].  CSW advised Ms. Jaster accepted bed offer from [facility]. Ms. Coriz requested that CSW notify [facility].  CSW contact [facility] and spoke with [place].  CSW advised that patient had been referred to SNF for rehab.   Ambrose Pancoast, [place]

## 2014-08-23 DIAGNOSIS — F068 Other specified mental disorders due to known physiological condition: Secondary | ICD-10-CM | POA: Diagnosis not present

## 2014-08-23 DIAGNOSIS — E559 Vitamin D deficiency, unspecified: Secondary | ICD-10-CM | POA: Diagnosis not present

## 2014-08-23 DIAGNOSIS — I10 Essential (primary) hypertension: Secondary | ICD-10-CM | POA: Diagnosis not present

## 2014-08-23 DIAGNOSIS — E46 Unspecified protein-calorie malnutrition: Secondary | ICD-10-CM | POA: Diagnosis not present

## 2014-08-23 DIAGNOSIS — F3130 Bipolar disorder, current episode depressed, mild or moderate severity, unspecified: Secondary | ICD-10-CM | POA: Diagnosis not present

## 2014-08-23 DIAGNOSIS — R748 Abnormal levels of other serum enzymes: Secondary | ICD-10-CM | POA: Diagnosis not present

## 2014-08-24 DIAGNOSIS — E46 Unspecified protein-calorie malnutrition: Secondary | ICD-10-CM | POA: Diagnosis not present

## 2014-08-24 DIAGNOSIS — I10 Essential (primary) hypertension: Secondary | ICD-10-CM | POA: Diagnosis not present

## 2014-08-24 DIAGNOSIS — E559 Vitamin D deficiency, unspecified: Secondary | ICD-10-CM | POA: Diagnosis not present

## 2014-08-24 DIAGNOSIS — F3130 Bipolar disorder, current episode depressed, mild or moderate severity, unspecified: Secondary | ICD-10-CM | POA: Diagnosis not present

## 2014-08-24 DIAGNOSIS — R748 Abnormal levels of other serum enzymes: Secondary | ICD-10-CM | POA: Diagnosis not present

## 2014-08-24 DIAGNOSIS — F068 Other specified mental disorders due to known physiological condition: Secondary | ICD-10-CM | POA: Diagnosis not present

## 2014-08-24 LAB — CULTURE, BLOOD (ROUTINE X 2)
Culture: NO GROWTH
Culture: NO GROWTH

## 2014-08-30 DIAGNOSIS — I10 Essential (primary) hypertension: Secondary | ICD-10-CM | POA: Diagnosis not present

## 2014-08-30 DIAGNOSIS — F3130 Bipolar disorder, current episode depressed, mild or moderate severity, unspecified: Secondary | ICD-10-CM | POA: Diagnosis not present

## 2014-08-30 DIAGNOSIS — R748 Abnormal levels of other serum enzymes: Secondary | ICD-10-CM | POA: Diagnosis not present

## 2014-08-30 DIAGNOSIS — R21 Rash and other nonspecific skin eruption: Secondary | ICD-10-CM | POA: Diagnosis not present

## 2014-09-01 DIAGNOSIS — F3130 Bipolar disorder, current episode depressed, mild or moderate severity, unspecified: Secondary | ICD-10-CM | POA: Diagnosis not present

## 2014-09-01 DIAGNOSIS — D649 Anemia, unspecified: Secondary | ICD-10-CM | POA: Diagnosis not present

## 2014-09-01 DIAGNOSIS — R748 Abnormal levels of other serum enzymes: Secondary | ICD-10-CM | POA: Diagnosis not present

## 2014-09-01 DIAGNOSIS — R21 Rash and other nonspecific skin eruption: Secondary | ICD-10-CM | POA: Diagnosis not present

## 2014-09-06 DIAGNOSIS — D649 Anemia, unspecified: Secondary | ICD-10-CM | POA: Diagnosis not present

## 2014-09-06 DIAGNOSIS — F3130 Bipolar disorder, current episode depressed, mild or moderate severity, unspecified: Secondary | ICD-10-CM | POA: Diagnosis not present

## 2014-09-06 DIAGNOSIS — R21 Rash and other nonspecific skin eruption: Secondary | ICD-10-CM | POA: Diagnosis not present

## 2014-09-06 DIAGNOSIS — R748 Abnormal levels of other serum enzymes: Secondary | ICD-10-CM | POA: Diagnosis not present

## 2014-09-13 DIAGNOSIS — D649 Anemia, unspecified: Secondary | ICD-10-CM | POA: Diagnosis not present

## 2014-09-13 DIAGNOSIS — R609 Edema, unspecified: Secondary | ICD-10-CM | POA: Diagnosis not present

## 2014-09-13 DIAGNOSIS — N183 Chronic kidney disease, stage 3 (moderate): Secondary | ICD-10-CM | POA: Diagnosis not present

## 2014-09-20 DIAGNOSIS — R609 Edema, unspecified: Secondary | ICD-10-CM | POA: Diagnosis not present

## 2014-09-20 DIAGNOSIS — D649 Anemia, unspecified: Secondary | ICD-10-CM | POA: Diagnosis not present

## 2014-09-20 DIAGNOSIS — N183 Chronic kidney disease, stage 3 (moderate): Secondary | ICD-10-CM | POA: Diagnosis not present

## 2014-09-24 DIAGNOSIS — N179 Acute kidney failure, unspecified: Secondary | ICD-10-CM | POA: Diagnosis not present

## 2014-09-26 DIAGNOSIS — N183 Chronic kidney disease, stage 3 (moderate): Secondary | ICD-10-CM | POA: Diagnosis not present

## 2014-09-26 DIAGNOSIS — D649 Anemia, unspecified: Secondary | ICD-10-CM | POA: Diagnosis not present

## 2014-09-26 DIAGNOSIS — I10 Essential (primary) hypertension: Secondary | ICD-10-CM | POA: Diagnosis not present

## 2014-09-26 DIAGNOSIS — R609 Edema, unspecified: Secondary | ICD-10-CM | POA: Diagnosis not present

## 2014-09-28 DIAGNOSIS — I10 Essential (primary) hypertension: Secondary | ICD-10-CM | POA: Diagnosis not present

## 2014-09-28 DIAGNOSIS — D649 Anemia, unspecified: Secondary | ICD-10-CM | POA: Diagnosis not present

## 2014-09-28 DIAGNOSIS — N183 Chronic kidney disease, stage 3 (moderate): Secondary | ICD-10-CM | POA: Diagnosis not present

## 2014-09-28 DIAGNOSIS — R609 Edema, unspecified: Secondary | ICD-10-CM | POA: Diagnosis not present

## 2014-10-04 ENCOUNTER — Ambulatory Visit (INDEPENDENT_AMBULATORY_CARE_PROVIDER_SITE_OTHER): Payer: PPO | Admitting: Psychiatry

## 2014-10-04 ENCOUNTER — Encounter (HOSPITAL_COMMUNITY): Payer: Self-pay | Admitting: Psychiatry

## 2014-10-04 VITALS — BP 111/62 | HR 68 | Ht 64.0 in | Wt 266.4 lb

## 2014-10-04 DIAGNOSIS — F5105 Insomnia due to other mental disorder: Secondary | ICD-10-CM

## 2014-10-04 DIAGNOSIS — F489 Nonpsychotic mental disorder, unspecified: Secondary | ICD-10-CM

## 2014-10-04 DIAGNOSIS — F331 Major depressive disorder, recurrent, moderate: Secondary | ICD-10-CM

## 2014-10-04 MED ORDER — CLONAZEPAM 0.5 MG PO TABS: 0.5000 mg | ORAL_TABLET | Freq: Two times a day (BID) | ORAL | Status: DC | PRN

## 2014-10-04 MED ORDER — RISPERIDONE 1 MG PO TABS: 1.0000 mg | ORAL_TABLET | Freq: Every day | ORAL | Status: DC

## 2014-10-04 MED ORDER — RISPERIDONE 0.25 MG PO TABS: 0.2500 mg | ORAL_TABLET | Freq: Three times a day (TID) | ORAL | Status: DC

## 2014-10-04 MED ORDER — BUPROPION HCL ER (XL) 300 MG PO TB24: 300.0000 mg | ORAL_TABLET | ORAL | Status: DC

## 2014-10-04 MED ORDER — BENZTROPINE MESYLATE 0.5 MG PO TABS: 0.5000 mg | ORAL_TABLET | Freq: Two times a day (BID) | ORAL | Status: DC

## 2014-10-04 MED ORDER — LAMOTRIGINE 100 MG PO TABS: 100.0000 mg | ORAL_TABLET | Freq: Two times a day (BID) | ORAL | Status: DC

## 2014-10-04 MED ORDER — PAROXETINE HCL 40 MG PO TABS: 40.0000 mg | ORAL_TABLET | Freq: Every day | ORAL | Status: DC

## 2014-10-04 NOTE — Psych (Signed)
BH MD/PA/NP OP Progress Note  10/04/2014 11:53 AM Amy Clay  MRN:  [id number]  Subjective:  Patient is 74 year old Caucasian female who came with her sister for her followup appointment. She lives in the [facility]. She and her sister had been living together in the family farm outside of [place]. Neither one has ever been married or have had children. The patient worked until the early 80s when she began to have difficulties with her nerves.  Currently the patient is still struggling with her mental and physical health. She was admitted last year to a psychiatric hospital in [place] because she was anxious nervous and depressed. Last time Dr. Lolly MustacheArfeen increased her Risperdal which is helped somewhat. She can't walk and it's not clear why much of it seems to be anxiety based and she seems to have a fear of falling. She's also recently developed possible renal failure and is can be referred to a nephrologist. She was on lithium for a number of years. We do not have her recent records from her primary care physician who is outside of [place]  The patient and her sister return after 2 months. She was in the hospital in early April for dehydration and hypothermia. The doctor there felt she needed physical therapy and therefore she has been [facility]. She really seems to be doing better there. She is getting more consistent medical care and she is getting physical therapy. She is smiling today and seems happier and more interactive. She is sleeping fairly well. Chief Complaint:  Chief Complaint    Anxiety     Visit Diagnosis:     ICD-9-CM ICD-10-CM   1. Major depressive disorder, recurrent episode, moderate 296.32 F33.1 risperiDONE (RISPERDAL) 1 MG tablet     benztropine (COGENTIN) 0.5 MG tablet     lamoTRIgine (LAMICTAL) 100 MG tablet  2. Insomnia due to mental disorder 300.9 F48.9 risperiDONE (RISPERDAL) 1 MG tablet   327.02 F51.05     Past Medical History:  Past Medical History  Diagnosis Date  . High blood pressure   . Arthritis   . Depression   . Anxiety   . Hemorrhoids   . Hyperlipidemia   . Bipolar 1 disorder     Past Surgical History  Procedure Laterality Date  . Back surgery  03/16/87  . Cholecystectomy  08/15/93  . Partial hysterectomy  04/04/95  . Bladder tacked  04/04/95  . Ankle surgery  04/28/97    right ankle fracture  . Wrist surgery  02/24/09    right wrist fracture  . Colonoscopy  10/26/2002    Dr. Karilyn Cotaehman- small external hemorrhoids otherwise normal   . Breast biopsy  05/16/95    right side  . Savory dilation  10/31/2011    Procedure: SAVORY DILATION;  Surgeon: Corbin Adeobert M Rourk, MD;  [facility];  Service: Endoscopy;  Laterality: N/A;  Elease Hashimoto. Maloney dilation  10/31/2011    Procedure: Elease HashimotoMALONEY DILATION;  Surgeon: Corbin Adeobert M Rourk, MD;  Location: [facility];  Service: Endoscopy;  Laterality: N/A;  . Abdominal hysterectomy     Family History:  Family History  Problem Relation Age of Onset  . Arthritis    . Asthma    . Diabetes    . Colon cancer Neg Hx   . Anxiety disorder Paternal Aunt   . Anxiety disorder Paternal Uncle   . Bipolar disorder Cousin    Social History:  History  Social History  . Marital Status: Single    Spouse Name: N/A  . Number of Children: N/A  . Years of Education: 12   Occupational History  . retired    Social History Main Topics  . Smoking status: Never Smoker   . Smokeless tobacco: Never Used  . Alcohol Use: No  . Drug Use: No  . Sexual Activity: No   Other Topics Concern  . None   Social History Narrative   Additional History:   Assessment: Patient is stable on her current medications and seems to be doing better and the current nursing care facility than she had been in the past  Musculoskeletal: Strength & Muscle Tone: decreased Gait & Station: unsteady, unable to stand Patient leans: N/A  Psychiatric Specialty  Exam: HPI  Review of Systems  Cardiovascular: Positive for leg swelling.  Musculoskeletal: Positive for back pain and joint pain.  Psychiatric/Behavioral: The patient is nervous/anxious.   All other systems reviewed and are negative.   Blood pressure 111/62, pulse 68, height  (1.626 m), weight 120.838 kg (266 lb 6.4 oz).Body mass index is 45.7 kg/(m^2).  General Appearance: Casual and Fairly Groomed in a wheelchair   Eye Contact:  Good  Speech:  Clear and Coherent  Volume:  Normal  Mood:  Euthymic  Affect:  Congruent  Thought Process:  Coherent  Orientation:  Full (Time, Place, and Person)  Thought Content:  Rumination  Suicidal Thoughts:  No  Homicidal Thoughts:  No  Memory:  Immediate;   Fair Recent;   Fair Remote;   Fair  Judgement:  Poor  Insight:  Lacking  Psychomotor Activity:  Decreased  Concentration:  Fair  Recall:  Poor  Fund of Knowledge: Good  Language: Good  Akathisia:  No  Handed:  Right  AIMS (if indicated):    Assets:  Communication Skills Desire for Improvement Resilience Social Support  ADL's:  Impaired  Cognition: WNL  Sleep:  fair   Is the patient at risk to self?  No. Has the patient been a risk to self in the past 6 months?  No. Has the patient been a risk to self within the distant past?  No. Is the patient a risk to others?  No. Has the patient been a risk to others in the past 6 months?  No. Has the patient been a risk to others within the distant past?  No.  Current Medications: Current Outpatient Prescriptions  Medication Sig Dispense Refill  . acetaminophen (TYLENOL) 500 MG tablet Take 1,000 mg by mouth every 6 (six) hours as needed for pain.    Marland Kitchen aspirin EC 81 MG tablet Take 81 mg by mouth every morning.     . benztropine (COGENTIN) 0.5 MG tablet Take 1 tablet (0.5 mg total) by mouth 2 (two) times daily. 60 tablet 2  . beta carotene w/minerals (OCUVITE) tablet Take 1 tablet by mouth every morning.     Marland Kitchen buPROPion (WELLBUTRIN XL)  300 MG 24 hr tablet Take 1 tablet (300 mg total) by mouth every morning. 30 tablet 2  . calcium carbonate (OS-CAL) 600 MG TABS Take 1,200 mg by mouth daily with breakfast.     . Cholecalciferol (VITAMIN D) 2000 UNITS CAPS Take 1 capsule by mouth every morning.     . clonazePAM (KLONOPIN) 0.5 MG tablet Take 1 tablet (0.5 mg total) by mouth 2 (two) times daily as needed for anxiety. 60 tablet 2  . docusate sodium (STOOL SOFTENER) 100 MG capsule Take  200 mg by mouth at bedtime.    . furosemide (LASIX) 20 MG tablet Take 20 mg by mouth every Monday, Wednesday, and Friday.     . lamoTRIgine (LAMICTAL) 100 MG tablet Take 1 tablet (100 mg total) by mouth 2 (two) times daily. 60 tablet 2  . loratadine (CLARITIN) 10 MG tablet Take 10 mg by mouth daily as needed for allergies.    Marland Kitchen losartan (COZAAR) 100 MG tablet Take 100 mg by mouth every morning.     . multivitamin (PROSIGHT) TABS tablet Take 1 tablet by mouth daily.    Marland Kitchen omeprazole (PRILOSEC) 40 MG capsule Take 40 mg by mouth daily.    Marland Kitchen PARoxetine (PAXIL) 40 MG tablet Take 1 tablet (40 mg total) by mouth at bedtime. 30 tablet 2  . polyethylene glycol (MIRALAX / GLYCOLAX) packet Take 17 g by mouth daily as needed for mild constipation.    . risperiDONE (RISPERDAL) 0.25 MG tablet Take 1 tablet (0.25 mg total) by mouth 3 (three) times daily. Takes at 8:00AM, 2:00PM, 5:00PM. 90 tablet 2  . risperiDONE (RISPERDAL) 1 MG tablet Take 1 tablet (1 mg total) by mouth at bedtime. 30 tablet 2  . solifenacin (VESICARE) 10 MG tablet Take 10 mg by mouth every morning.     . terazosin (HYTRIN) 2 MG capsule Take 2 mg by mouth at bedtime.     . mupirocin ointment (BACTROBAN) 2 % Place 1 application into the nose 3 (three) times daily as needed (wound care).    . [DISCONTINUED] lithium carbonate 150 MG capsule Take 1 capsule (150 mg total) by mouth daily at 12 noon. 30 capsule 2   No current facility-administered medications for this visit.    Medical Decision Making:   Established Problem, Stable/Improving (1), Review of Psycho-Social Stressors (1), Review and summation of old records (2) and Review of Medication Regimen & Side Effects (2)  Treatment Plan Summary:Medication management patient will continue Wellbutrin and Paxil for depression, Risperdal for agitation due to dementia, Lamictal for mood swings, Cogentin potential side effects from Risperdal and clonazepam for anxiety. She'll return in 3 months or sooner if the nursing home feels she needs to have a reevaluation before that   [facility], [facility] 10/04/2014, 11:53 AM

## 2014-11-04 ENCOUNTER — Telehealth (HOSPITAL_COMMUNITY): Payer: Self-pay | Admitting: *Deleted

## 2014-11-04 NOTE — Telephone Encounter (Signed)
Molly from pt assisted living home called stating lately pt have been veery depressed and crying a lot. Per Kirt Boys she would like to know if pt could be seen sooner then 01-03-15. Schedule pt with an earlier appt for July 21st and Kirt Boys agreed to time and dated.

## 2014-11-04 NOTE — Telephone Encounter (Signed)
noted 

## 2014-12-01 ENCOUNTER — Ambulatory Visit (INDEPENDENT_AMBULATORY_CARE_PROVIDER_SITE_OTHER): Payer: PPO | Admitting: Psychiatry

## 2014-12-01 ENCOUNTER — Encounter (HOSPITAL_COMMUNITY): Payer: Self-pay | Admitting: Psychiatry

## 2014-12-01 VITALS — BP 106/64 | HR 75 | Ht 64.0 in | Wt 216.8 lb

## 2014-12-01 DIAGNOSIS — F09 Unspecified mental disorder due to known physiological condition: Secondary | ICD-10-CM

## 2014-12-01 DIAGNOSIS — F419 Anxiety disorder, unspecified: Secondary | ICD-10-CM

## 2014-12-01 DIAGNOSIS — F331 Major depressive disorder, recurrent, moderate: Secondary | ICD-10-CM

## 2014-12-01 DIAGNOSIS — F5105 Insomnia due to other mental disorder: Secondary | ICD-10-CM

## 2014-12-01 DIAGNOSIS — F323 Major depressive disorder, single episode, severe with psychotic features: Secondary | ICD-10-CM

## 2014-12-01 MED ORDER — BUPROPION HCL ER (XL) 300 MG PO TB24: 300.0000 mg | ORAL_TABLET | ORAL | Status: DC

## 2014-12-01 MED ORDER — RISPERIDONE 1 MG PO TABS
1.0000 mg | ORAL_TABLET | Freq: Every day | ORAL | Status: DC
Start: 2014-12-01 — End: 2015-03-01

## 2014-12-01 MED ORDER — PAROXETINE HCL 40 MG PO TABS: 40.0000 mg | ORAL_TABLET | Freq: Every day | ORAL | Status: DC

## 2014-12-01 MED ORDER — BENZTROPINE MESYLATE 0.5 MG PO TABS: 0.5000 mg | ORAL_TABLET | Freq: Two times a day (BID) | ORAL | Status: DC

## 2014-12-01 MED ORDER — CLONAZEPAM 0.5 MG PO TABS: ORAL_TABLET | ORAL | Status: DC

## 2014-12-01 MED ORDER — LAMOTRIGINE 100 MG PO TABS
100.0000 mg | ORAL_TABLET | Freq: Two times a day (BID) | ORAL | Status: DC
Start: 2014-12-01 — End: 2015-03-01

## 2014-12-01 MED ORDER — RISPERIDONE 0.25 MG PO TABS: 0.2500 mg | ORAL_TABLET | Freq: Three times a day (TID) | ORAL | Status: DC

## 2014-12-01 NOTE — Progress Notes (Signed)
Patient ID: Amy Clay, female   DOB: 03/17/1941, 74 y.o.   MRN: [id number] Patient ID: Amy Clay, female   DOB: 05/03/1941, 74 y.o.   MRN: [id number] Patient ID: Amy Clay, female   DOB: 1940-10-26, 74 y.o.   MRN: [id number] Patient ID: Amy Clay, female   DOB: 16-Jun-1940, 74 y.o.   MRN: [id number] Patient ID: Amy Clay, female   DOB: 03-22-1941, 74 y.o.   MRN: [id number] Patient ID: Amy Clay, female   DOB: [birth year], 74 y.o.   MRN: [id number] Patient ID: Amy Clay, female   DOB: [birth year], 74 y.o.   MRN: [id number] Patient ID: Amy Clay, female   DOB: 1940/12/11, 74 y.o.   MRN: [id number] Patient ID: Amy Clay, female   DOB: 1941-04-27, 74 y.o.   MRN: [id number] Patient ID: Amy Clay, female   DOB: [birth year], 74 y.o.   MRN: [id number] Patient ID: Amy Clay, female   DOB: September 02, 1940, 74 y.o.   MRN: [id number] [facility] [id number] Progress Note LEALER MARSLAND MRN: [id number] DOB: 1940/11/19 Age: 74 y.o.  Date: 12/01/2014  Chief Complaint  Patient presents with  . Depression  . Anxiety  . Memory Loss  . Follow-up   History of presenting illness Patient is 74 year old Caucasian female who came with her sister for her followup appointment. She lives in the [facility]. She and her sister had been living together in the family farm outside of [place]. Neither one has ever been married or have had children. The patient worked until the early 80s when she began to have difficulties with her nerves.  Currently the patient is still struggling with her mental and physical health. She was admitted last year to a psychiatric hospital in [place] because she was anxious nervous and depressed. Last time Dr. Lolly Mustache increased her Risperdal which is helped somewhat. She can't walk and it's not clear why much of it seems to be anxiety based and she seems to have a fear of falling. She's also recently  developed possible renal failure and is can be referred to a nephrologist. She was on lithium for a number of years. We do not have her  recent records from her primary care physician who is outside of [place]  The patient returns after 3 months. She is here with her sister. She now lives in the [facility] nursing facility and they had called last week and wanted her seen sooner because she seemed more depressed. I called her nurse at the nursing home and she states that patient is doing better now. Apparently she had a UTI a couple of weeks ago which really threw her off mentally. She started ruminating and worrying about paying taxes that were paid back in April. She is bright and pleasant today and showed me some jewelry she had made. Her sister states that she gets upset and agitated in the late afternoon especially after sister has to go home. I will therefore create a standing order of clonazepam around that time  Past psychiatric history Patient is seeing psychiatrist since 1997 in this office.  Her last psychiatric admission was at [facility] after having suicidal thoughts.  She denies any history of suicidal attempt however endorse history of hopeless feeling.  She is diagnosed with major depressive disorder. She she was very stable on lithium until her creatinine went up and lithium was discontinued.  We had recently tried Lamictal and the dose  has been increased recently.  In the past we had tried BuSpar which was discontinued at hospital.  She has another psychiatric inpatient treatment in 90s. She admitted history of mania, depression and psychosis.  Social history Patient lives in a nursing home.  She has no children.  Her husband died in nursing home.  Patient has no other family.  Medical history Patient was recently admitted due to dehydration and UTI.  Patient has history of arthritis blood pressure and chronic pain. Her primary care physician is Dr. Phillips Odor at [facility].  She takes medication for her blood pressure and arthritis.  She had history of jerking movements, fall and generalized pain. Her last blood work was done on 02/14/2012 which shows anemia, her creatinine was 1.33.  She was given antibiotic result UTI.  Her WBC count was 10.4.  Family History family history includes Anxiety disorder in her paternal aunt and paternal uncle; Arthritis in an other family member; Asthma in an other family member; Bipolar disorder in her cousin; Diabetes in an other family member. There is no history of Colon cancer.  ROS   Mental status examination Patient is casually dressed and fairly groomed. She is in a wheelchair   She described her mood as ok.Her voice is high-pitched and soft.  .Her affect is fairly bright and she is smiling more today  Her attention and concentration are poor today  She expresses herself well .  She doesn't have auditory or visual hallucination.  There were no paranoia or delusion obsession present at this time.  Her fund of knowledge is okay.  There are no tremors and shakes in her hand.  She's alert and oriented x3 needs to be reminded about the month. She is aware that we are in the summertime. Her insight judgment are poor and impulse control is okay.  Lab Results:  Results for orders placed or performed during the hospital encounter of 08/18/14 (from the past 8736 hour(s))  POC CBG, ED   Collection Time: 08/19/14 12:13 AM  Result Value Ref Range   Glucose-Capillary 62 (L) 70 - 99 mg/dL   Comment 1 Call MD NNP PA CNM   Urinalysis, Routine w reflex microscopic   Collection Time: 08/19/14 12:32 AM  Result Value Ref Range   Color, Urine YELLOW YELLOW   APPearance CLEAR CLEAR   Specific Gravity, Urine 1.025 1.005 - 1.030   pH 5.5 5.0 - 8.0   Glucose, UA NEGATIVE NEGATIVE mg/dL   Hgb urine dipstick NEGATIVE NEGATIVE   Bilirubin Urine NEGATIVE NEGATIVE   Ketones, ur NEGATIVE NEGATIVE mg/dL   Protein, ur NEGATIVE  NEGATIVE mg/dL   Urobilinogen, UA 0.2 0.0 - 1.0 mg/dL   Nitrite NEGATIVE NEGATIVE   Leukocytes, UA NEGATIVE NEGATIVE  Culture, blood (routine x 2)   Collection Time: 08/19/14 12:36 AM  Result Value Ref Range   Specimen Description BLOOD RIGHT ANTECUBITAL    Special Requests BOTTLES DRAWN AEROBIC AND ANAEROBIC 10CC EACH    Culture NO GROWTH 5 DAYS    Report Status 08/24/2014 FINAL   Comprehensive metabolic panel   Collection Time: 08/19/14 12:36 AM  Result Value Ref Range   Sodium 140 135 - 145 mmol/L   Potassium 5.9 (H) 3.5 - 5.1 mmol/L   Chloride 107 96 - 112 mmol/L   CO2 28 19 - 32 mmol/L   Glucose, Bld 72 70 - 99 mg/dL   BUN 55 (H) 6 - 23 mg/dL   Creatinine, Ser 1.61 (H) 0.50 -  1.10 mg/dL   Calcium 9.6 8.4 - 16.1 mg/dL   Total Protein 6.4 6.0 - 8.3 g/dL   Albumin 3.2 (L) 3.5 - 5.2 g/dL   AST 44 (H) 0 - 37 U/L   ALT 54 (H) 0 - 35 U/L   Alkaline Phosphatase 134 (H) 39 - 117 U/L   Total Bilirubin 0.3 0.3 - 1.2 mg/dL   GFR calc non Af Amer 30 (L) >90 mL/min   GFR calc Af Amer 34 (L) >90 mL/min   Anion gap 5 5 - 15  CBC with Differential   Collection Time: 08/19/14 12:36 AM  Result Value Ref Range   WBC 4.5 4.0 - 10.5 K/uL   RBC 3.07 (L) 3.87 - 5.11 MIL/uL   Hemoglobin 9.1 (L) 12.0 - 15.0 g/dL   HCT 09.6 (L) 04.5 - 40.9 %   MCV 98.0 78.0 - 100.0 fL   MCH 29.6 26.0 - 34.0 pg   MCHC 30.2 30.0 - 36.0 g/dL   RDW 81.1 (H) 91.4 - 78.2 %   Platelets 132 (L) 150 - 400 K/uL   Neutrophils Relative % 70 43 - 77 %   Neutro Abs 3.1 1.7 - 7.7 K/uL   Lymphocytes Relative 19 12 - 46 %   Lymphs Abs 0.8 0.7 - 4.0 K/uL   Monocytes Relative 7 3 - 12 %   Monocytes Absolute 0.3 0.1 - 1.0 K/uL   Eosinophils Relative 4 0 - 5 %   Eosinophils Absolute 0.2 0.0 - 0.7 K/uL   Basophils Relative 0 0 - 1 %   Basophils Absolute 0.0 0.0 - 0.1 K/uL  TSH   Collection Time: 08/19/14 12:36 AM  Result Value Ref Range   TSH 2.751 0.350 - 4.500 uIU/mL  I-Stat CG4 Lactic Acid, ED   Collection Time:  08/19/14 12:44 AM  Result Value Ref Range   Lactic Acid, Venous 0.78 0.5 - 2.0 mmol/L  Culture, blood (routine x 2)   Collection Time: 08/19/14 12:50 AM  Result Value Ref Range   Specimen Description BLOOD LEFT HAND    Special Requests      BOTTLES DRAWN AEROBIC AND ANAEROBIC AEB=10CC ANA=5CC   Culture NO GROWTH 5 DAYS    Report Status 08/24/2014 FINAL   CBG monitoring, ED   Collection Time: 08/19/14  1:04 AM  Result Value Ref Range   Glucose-Capillary 79 70 - 99 mg/dL  CBG monitoring, ED   Collection Time: 08/19/14  2:25 AM  Result Value Ref Range   Glucose-Capillary 82 70 - 99 mg/dL  MRSA PCR Screening   Collection Time: 08/19/14  3:35 AM  Result Value Ref Range   MRSA by PCR NEGATIVE NEGATIVE  CBC   Collection Time: 08/19/14  5:38 AM  Result Value Ref Range   WBC 4.0 4.0 - 10.5 K/uL   RBC 3.06 (L) 3.87 - 5.11 MIL/uL   Hemoglobin 8.9 (L) 12.0 - 15.0 g/dL   HCT 95.6 (L) 21.3 - 08.6 %   MCV 97.4 78.0 - 100.0 fL   MCH 29.1 26.0 - 34.0 pg   MCHC 29.9 (L) 30.0 - 36.0 g/dL   RDW 57.8 (H) 46.9 - 62.9 %   Platelets 143 (L) 150 - 400 K/uL  Comprehensive metabolic panel   Collection Time: 08/19/14  5:38 AM  Result Value Ref Range   Sodium 143 135 - 145 mmol/L   Potassium 5.8 (H) 3.5 - 5.1 mmol/L   Chloride 110 96 - 112 mmol/L   CO2 27 19 -  32 mmol/L   Glucose, Bld 89 70 - 99 mg/dL   BUN 54 (H) 6 - 23 mg/dL   Creatinine, Ser 4.09 (H) 0.50 - 1.10 mg/dL   Calcium 9.8 8.4 - 81.1 mg/dL   Total Protein 6.3 6.0 - 8.3 g/dL   Albumin 3.1 (L) 3.5 - 5.2 g/dL   AST 45 (H) 0 - 37 U/L   ALT 54 (H) 0 - 35 U/L   Alkaline Phosphatase 132 (H) 39 - 117 U/L   Total Bilirubin 0.3 0.3 - 1.2 mg/dL   GFR calc non Af Amer 32 (L) >90 mL/min   GFR calc Af Amer 37 (L) >90 mL/min   Anion gap 6 5 - 15  Glucose, capillary   Collection Time: 08/19/14 11:11 AM  Result Value Ref Range   Glucose-Capillary 64 (L) 70 - 99 mg/dL   Comment 1 Notify RN    Comment 2 Document in Chart   Glucose, capillary    Collection Time: 08/19/14 11:51 AM  Result Value Ref Range   Glucose-Capillary 75 70 - 99 mg/dL  Cortisol-am, blood   Collection Time: 08/19/14  3:43 PM  Result Value Ref Range   Cortisol - AM 16.2 4.3 - 22.4 ug/dL  T4, free   Collection Time: 08/19/14  3:43 PM  Result Value Ref Range   Free T4 0.97 0.80 - 1.80 ng/dL  Potassium   Collection Time: 08/19/14  3:49 PM  Result Value Ref Range   Potassium 5.4 (H) 3.5 - 5.1 mmol/L  Glucose, capillary   Collection Time: 08/19/14  4:53 PM  Result Value Ref Range   Glucose-Capillary 87 70 - 99 mg/dL   Comment 1 Notify RN    Comment 2 Document in Chart   Glucose, capillary   Collection Time: 08/19/14  9:57 PM  Result Value Ref Range   Glucose-Capillary 77 70 - 99 mg/dL  Glucose, capillary   Collection Time: 08/20/14  5:05 AM  Result Value Ref Range   Glucose-Capillary 73 70 - 99 mg/dL   Comment 1 Notify RN   Basic metabolic panel   Collection Time: 08/20/14  5:08 AM  Result Value Ref Range   Sodium 141 135 - 145 mmol/L   Potassium 5.0 3.5 - 5.1 mmol/L   Chloride 106 96 - 112 mmol/L   CO2 27 19 - 32 mmol/L   Glucose, Bld 75 70 - 99 mg/dL   BUN 47 (H) 6 - 23 mg/dL   Creatinine, Ser 9.14 (H) 0.50 - 1.10 mg/dL   Calcium 9.6 8.4 - 78.2 mg/dL   GFR calc non Af Amer 26 (L) >90 mL/min   GFR calc Af Amer 30 (L) >90 mL/min   Anion gap 8 5 - 15  CBC   Collection Time: 08/20/14  5:08 AM  Result Value Ref Range   WBC 4.6 4.0 - 10.5 K/uL   RBC 3.27 (L) 3.87 - 5.11 MIL/uL   Hemoglobin 9.4 (L) 12.0 - 15.0 g/dL   HCT 95.6 (L) 21.3 - 08.6 %   MCV 96.6 78.0 - 100.0 fL   MCH 28.7 26.0 - 34.0 pg   MCHC 29.7 (L) 30.0 - 36.0 g/dL   RDW 57.8 (H) 46.9 - 62.9 %   Platelets 162 150 - 400 K/uL  Hemoglobin A1c   Collection Time: 08/20/14  5:08 AM  Result Value Ref Range   Hgb A1c MFr Bld 5.5 4.8 - 5.6 %   Mean Plasma Glucose 111 mg/dL  Glucose, capillary   Collection  Time: 08/20/14  7:23 AM  Result Value Ref Range   Glucose-Capillary 83 70  - 99 mg/dL   Comment 1 Notify RN    Comment 2 Document in Chart   Vitamin B12   Collection Time: 08/20/14  8:59 AM  Result Value Ref Range   Vitamin B-12 1437 (H) 211 - 911 pg/mL  Iron and TIBC   Collection Time: 08/20/14  8:59 AM  Result Value Ref Range   Iron 118 42 - 145 ug/dL   TIBC 161 096 - 045 ug/dL   Saturation Ratios 35 20 - 55 %   UIBC 220 125 - 400 ug/dL  Ferritin   Collection Time: 08/20/14  8:59 AM  Result Value Ref Range   Ferritin 58 10 - 291 ng/mL  Glucose, capillary   Collection Time: 08/20/14 11:35 AM  Result Value Ref Range   Glucose-Capillary 102 (H) 70 - 99 mg/dL  Glucose, capillary   Collection Time: 08/20/14  4:45 PM  Result Value Ref Range   Glucose-Capillary 86 70 - 99 mg/dL   Comment 1 Notify RN    Comment 2 Document in Chart   Glucose, capillary   Collection Time: 08/20/14  7:52 PM  Result Value Ref Range   Glucose-Capillary 97 70 - 99 mg/dL  Glucose, capillary   Collection Time: 08/21/14  2:09 AM  Result Value Ref Range   Glucose-Capillary 97 70 - 99 mg/dL  Glucose, capillary   Collection Time: 08/21/14  4:45 AM  Result Value Ref Range   Glucose-Capillary 81 70 - 99 mg/dL  CBC   Collection Time: 08/21/14  6:19 AM  Result Value Ref Range   WBC 4.9 4.0 - 10.5 K/uL   RBC 3.23 (L) 3.87 - 5.11 MIL/uL   Hemoglobin 9.5 (L) 12.0 - 15.0 g/dL   HCT 40.9 (L) 81.1 - 91.4 %   MCV 96.3 78.0 - 100.0 fL   MCH 29.4 26.0 - 34.0 pg   MCHC 30.5 30.0 - 36.0 g/dL   RDW 78.2 (H) 95.6 - 21.3 %   Platelets 152 150 - 400 K/uL  Basic metabolic panel   Collection Time: 08/21/14  6:19 AM  Result Value Ref Range   Sodium 143 135 - 145 mmol/L   Potassium 4.1 3.5 - 5.1 mmol/L   Chloride 107 96 - 112 mmol/L   CO2 28 19 - 32 mmol/L   Glucose, Bld 88 70 - 99 mg/dL   BUN 37 (H) 6 - 23 mg/dL   Creatinine, Ser 0.86 (H) 0.50 - 1.10 mg/dL   Calcium 9.8 8.4 - 57.8 mg/dL   GFR calc non Af Amer 25 (L) >90 mL/min   GFR calc Af Amer 29 (L) >90 mL/min   Anion gap 8 5 -  15  Glucose, capillary   Collection Time: 08/21/14  7:39 AM  Result Value Ref Range   Glucose-Capillary 79 70 - 99 mg/dL   Comment 1 Notify RN    Comment 2 Document in Chart   Glucose, capillary   Collection Time: 08/21/14 11:29 AM  Result Value Ref Range   Glucose-Capillary 95 70 - 99 mg/dL   Comment 1 Notify RN    Comment 2 Document in Chart   Glucose, capillary   Collection Time: 08/21/14  4:23 PM  Result Value Ref Range   Glucose-Capillary 102 (H) 70 - 99 mg/dL   Comment 1 Notify RN    Comment 2 Document in Chart   Glucose, capillary   Collection Time: 08/21/14  9:02 PM  Result Value Ref Range   Glucose-Capillary 97 70 - 99 mg/dL   Comment 1 Notify RN   Basic metabolic panel   Collection Time: 08/22/14  6:27 AM  Result Value Ref Range   Sodium 143 135 - 145 mmol/L   Potassium 3.9 3.5 - 5.1 mmol/L   Chloride 107 96 - 112 mmol/L   CO2 27 19 - 32 mmol/L   Glucose, Bld 94 70 - 99 mg/dL   BUN 33 (H) 6 - 23 mg/dL   Creatinine, Ser 1.61 (H) 0.50 - 1.10 mg/dL   Calcium 9.6 8.4 - 09.6 mg/dL   GFR calc non Af Amer 28 (L) >90 mL/min   GFR calc Af Amer 33 (L) >90 mL/min   Anion gap 9 5 - 15  Glucose, capillary   Collection Time: 08/22/14  7:33 AM  Result Value Ref Range   Glucose-Capillary 87 70 - 99 mg/dL  Glucose, capillary   Collection Time: 08/22/14 11:38 AM  Result Value Ref Range   Glucose-Capillary 77 70 - 99 mg/dL  Results for orders placed or performed during the hospital encounter of 06/20/14 (from the past 8736 hour(s))  CBC with Differential   Collection Time: 06/20/14  9:43 PM  Result Value Ref Range   WBC 7.7 4.0 - 10.5 K/uL   RBC 3.64 (L) 3.87 - 5.11 MIL/uL   Hemoglobin 10.5 (L) 12.0 - 15.0 g/dL   HCT 04.5 (L) 40.9 - 81.1 %   MCV 96.4 78.0 - 100.0 fL   MCH 28.8 26.0 - 34.0 pg   MCHC 29.9 (L) 30.0 - 36.0 g/dL   RDW 91.4 78.2 - 95.6 %   Platelets 246 150 - 400 K/uL   Neutrophils Relative % 75 43 - 77 %   Neutro Abs 5.8 1.7 - 7.7 K/uL   Lymphocytes  Relative 13 12 - 46 %   Lymphs Abs 1.0 0.7 - 4.0 K/uL   Monocytes Relative 9 3 - 12 %   Monocytes Absolute 0.7 0.1 - 1.0 K/uL   Eosinophils Relative 3 0 - 5 %   Eosinophils Absolute 0.2 0.0 - 0.7 K/uL   Basophils Relative 0 0 - 1 %   Basophils Absolute 0.0 0.0 - 0.1 K/uL  Comprehensive metabolic panel   Collection Time: 06/20/14  9:43 PM  Result Value Ref Range   Sodium 140 135 - 145 mmol/L   Potassium 4.2 3.5 - 5.1 mmol/L   Chloride 108 96 - 112 mmol/L   CO2 29 19 - 32 mmol/L   Glucose, Bld 113 (H) 70 - 99 mg/dL   BUN 36 (H) 6 - 23 mg/dL   Creatinine, Ser 2.13 (H) 0.50 - 1.10 mg/dL   Calcium 9.5 8.4 - 08.6 mg/dL   Total Protein 7.4 6.0 - 8.3 g/dL   Albumin 3.6 3.5 - 5.2 g/dL   AST 22 0 - 37 U/L   ALT 17 0 - 35 U/L   Alkaline Phosphatase 113 39 - 117 U/L   Total Bilirubin 0.3 0.3 - 1.2 mg/dL   GFR calc non Af Amer 28 (L) >90 mL/min   GFR calc Af Amer 33 (L) >90 mL/min   Anion gap 3 (L) 5 - 15  Results for orders placed or performed during the hospital encounter of 06/15/14 (from the past 8736 hour(s))  CBC with Differential   Collection Time: 06/15/14  5:35 PM  Result Value Ref Range   WBC 7.2 4.0 - 10.5 K/uL   RBC 3.61 (  L) 3.87 - 5.11 MIL/uL   Hemoglobin 10.3 (L) 12.0 - 15.0 g/dL   HCT 72.5 (L) 36.6 - 44.0 %   MCV 95.6 78.0 - 100.0 fL   MCH 28.5 26.0 - 34.0 pg   MCHC 29.9 (L) 30.0 - 36.0 g/dL   RDW 34.7 42.5 - 95.6 %   Platelets 236 150 - 400 K/uL   Neutrophils Relative % 74 43 - 77 %   Neutro Abs 5.3 1.7 - 7.7 K/uL   Lymphocytes Relative 15 12 - 46 %   Lymphs Abs 1.1 0.7 - 4.0 K/uL   Monocytes Relative 9 3 - 12 %   Monocytes Absolute 0.7 0.1 - 1.0 K/uL   Eosinophils Relative 2 0 - 5 %   Eosinophils Absolute 0.2 0.0 - 0.7 K/uL   Basophils Relative 0 0 - 1 %   Basophils Absolute 0.0 0.0 - 0.1 K/uL  I-Stat CG4 Lactic Acid, ED   Collection Time: 06/15/14  5:48 PM  Result Value Ref Range   Lactic Acid, Venous 0.92 0.5 - 2.0 mmol/L  Comprehensive metabolic panel    Collection Time: 06/15/14  6:40 PM  Result Value Ref Range   Sodium 141 135 - 145 mmol/L   Potassium 4.2 3.5 - 5.1 mmol/L   Chloride 107 96 - 112 mmol/L   CO2 28 19 - 32 mmol/L   Glucose, Bld 103 (H) 70 - 99 mg/dL   BUN 36 (H) 6 - 23 mg/dL   Creatinine, Ser 3.87 (H) 0.50 - 1.10 mg/dL   Calcium 9.8 8.4 - 56.4 mg/dL   Total Protein 7.2 6.0 - 8.3 g/dL   Albumin 3.7 3.5 - 5.2 g/dL   AST 23 0 - 37 U/L   ALT 18 0 - 35 U/L   Alkaline Phosphatase 124 (H) 39 - 117 U/L   Total Bilirubin 0.5 0.3 - 1.2 mg/dL   GFR calc non Af Amer 32 (L) >90 mL/min   GFR calc Af Amer 37 (L) >90 mL/min   Anion gap 6 5 - 15  Results for orders placed or performed during the hospital encounter of 04/28/14 (from the past 8736 hour(s))  Urine culture   Collection Time: 04/28/14  7:25 AM  Result Value Ref Range   Specimen Description URINE, CATHETERIZED    Special Requests NONE    Culture  Setup Time      04/28/2014 14:14 Performed at [facility]      >=100,000 COLONIES/ML Performed at [facility]    Culture      ESCHERICHIA COLI Performed at [facility]    Report Status 04/30/2014 FINAL    Organism ID, Bacteria ESCHERICHIA COLI       Susceptibility   Escherichia coli - MIC*    AMPICILLIN <=2 SENSITIVE Sensitive     CEFAZOLIN <=4 SENSITIVE Sensitive     CEFTRIAXONE <=1 SENSITIVE Sensitive     CIPROFLOXACIN <=0.25 SENSITIVE Sensitive     GENTAMICIN <=1 SENSITIVE Sensitive     LEVOFLOXACIN <=0.12 SENSITIVE Sensitive     NITROFURANTOIN <=16 SENSITIVE Sensitive     TOBRAMYCIN <=1 SENSITIVE Sensitive     TRIMETH/SULFA <=20 SENSITIVE Sensitive     PIP/TAZO <=4 SENSITIVE Sensitive     * ESCHERICHIA COLI  Urinalysis, Routine w reflex microscopic   Collection Time: 04/28/14  7:41 AM  Result Value Ref Range   Color, Urine YELLOW YELLOW   APPearance CLEAR CLEAR   Specific Gravity, Urine 1.015  1.005 - 1.030   pH 6.5 5.0 - 8.0   Glucose, UA NEGATIVE NEGATIVE  mg/dL   Hgb urine dipstick TRACE (A) NEGATIVE   Bilirubin Urine NEGATIVE NEGATIVE   Ketones, ur NEGATIVE NEGATIVE mg/dL   Protein, ur NEGATIVE NEGATIVE mg/dL   Urobilinogen, UA 0.2 0.0 - 1.0 mg/dL   Nitrite POSITIVE (A) NEGATIVE   Leukocytes, UA LARGE (A) NEGATIVE  Urine microscopic-add on   Collection Time: 04/28/14  7:41 AM  Result Value Ref Range   WBC, UA TOO NUMEROUS TO COUNT <3 WBC/hpf   RBC / HPF 3-6 <3 RBC/hpf   Bacteria, UA MANY (A) RARE  Basic metabolic panel   Collection Time: 04/28/14  7:44 AM  Result Value Ref Range   Sodium 142 137 - 147 mEq/L   Potassium 4.5 3.7 - 5.3 mEq/L   Chloride 105 96 - 112 mEq/L   CO2 28 19 - 32 mEq/L   Glucose, Bld 117 (H) 70 - 99 mg/dL   BUN 39 (H) 6 - 23 mg/dL   Creatinine, Ser 1.61 (H) 0.50 - 1.10 mg/dL   Calcium 9.9 8.4 - 09.6 mg/dL   GFR calc non Af Amer 31 (L) >90 mL/min   GFR calc Af Amer 36 (L) >90 mL/min   Anion gap 9 5 - 15  CBC with Differential   Collection Time: 04/28/14  7:44 AM  Result Value Ref Range   WBC 6.5 4.0 - 10.5 K/uL   RBC 3.33 (L) 3.87 - 5.11 MIL/uL   Hemoglobin 9.8 (L) 12.0 - 15.0 g/dL   HCT 04.5 (L) 40.9 - 81.1 %   MCV 95.8 78.0 - 100.0 fL   MCH 29.4 26.0 - 34.0 pg   MCHC 30.7 30.0 - 36.0 g/dL   RDW 91.4 (H) 78.2 - 95.6 %   Platelets 181 150 - 400 K/uL   Neutrophils Relative % 85 (H) 43 - 77 %   Neutro Abs 5.5 1.7 - 7.7 K/uL   Lymphocytes Relative 8 (L) 12 - 46 %   Lymphs Abs 0.5 (L) 0.7 - 4.0 K/uL   Monocytes Relative 5 3 - 12 %   Monocytes Absolute 0.4 0.1 - 1.0 K/uL   Eosinophils Relative 2 0 - 5 %   Eosinophils Absolute 0.1 0.0 - 0.7 K/uL   Basophils Relative 0 0 - 1 %   Basophils Absolute 0.0 0.0 - 0.1 K/uL  Results for orders placed or performed during the hospital encounter of 04/20/14 (from the past 8736 hour(s))  Lactic acid, plasma   Collection Time: 04/20/14  2:23 PM  Result Value Ref Range   Lactic Acid, Venous 1.2 0.5 - 2.2 mmol/L  CBC with Differential   Collection Time: 04/20/14   2:28 PM  Result Value Ref Range   WBC 5.3 4.0 - 10.5 K/uL   RBC 3.23 (L) 3.87 - 5.11 MIL/uL   Hemoglobin 9.5 (L) 12.0 - 15.0 g/dL   HCT 21.3 (L) 08.6 - 57.8 %   MCV 94.4 78.0 - 100.0 fL   MCH 29.4 26.0 - 34.0 pg   MCHC 31.1 30.0 - 36.0 g/dL   RDW 46.9 (H) 62.9 - 52.8 %   Platelets 198 150 - 400 K/uL   Neutrophils Relative % 66 43 - 77 %   Neutro Abs 3.5 1.7 - 7.7 K/uL   Lymphocytes Relative 21 12 - 46 %   Lymphs Abs 1.1 0.7 - 4.0 K/uL   Monocytes Relative 8 3 - 12 %   Monocytes  Absolute 0.4 0.1 - 1.0 K/uL   Eosinophils Relative 5 0 - 5 %   Eosinophils Absolute 0.3 0.0 - 0.7 K/uL   Basophils Relative 0 0 - 1 %   Basophils Absolute 0.0 0.0 - 0.1 K/uL  Basic metabolic panel   Collection Time: 04/20/14  2:28 PM  Result Value Ref Range   Sodium 142 137 - 147 mEq/L   Potassium 5.1 3.7 - 5.3 mEq/L   Chloride 104 96 - 112 mEq/L   CO2 29 19 - 32 mEq/L   Glucose, Bld 112 (H) 70 - 99 mg/dL   BUN 37 (H) 6 - 23 mg/dL   Creatinine, Ser 1.61 (H) 0.50 - 1.10 mg/dL   Calcium 09.6 8.4 - 04.5 mg/dL   GFR calc non Af Amer 28 (L) >90 mL/min   GFR calc Af Amer 32 (L) >90 mL/min   Anion gap 9 5 - 15  Troponin I   Collection Time: 04/20/14  2:28 PM  Result Value Ref Range   Troponin I <0.30 <0.30 ng/mL  Urine culture   Collection Time: 04/20/14  3:38 PM  Result Value Ref Range   Specimen Description URINE, CATHETERIZED    Special Requests NONE    Culture  Setup Time      04/21/2014 00:38 Performed at [facility]    Colony Count NO GROWTH Performed at [facility]     Culture NO GROWTH Performed at [facility]     Report Status 04/21/2014 FINAL   Urinalysis, Routine w reflex microscopic   Collection Time: 04/20/14  3:38 PM  Result Value Ref Range   Color, Urine YELLOW YELLOW   APPearance CLEAR CLEAR   Specific Gravity, Urine 1.020 1.005 - 1.030   pH 5.5 5.0 - 8.0   Glucose, UA NEGATIVE NEGATIVE mg/dL   Hgb urine dipstick NEGATIVE NEGATIVE   Bilirubin  Urine NEGATIVE NEGATIVE   Ketones, ur NEGATIVE NEGATIVE mg/dL   Protein, ur NEGATIVE NEGATIVE mg/dL   Urobilinogen, UA 0.2 0.0 - 1.0 mg/dL   Nitrite NEGATIVE NEGATIVE   Leukocytes, UA NEGATIVE NEGATIVE  Results for orders placed or performed during the hospital encounter of 02/14/14 (from the past 8736 hour(s))  Urinalysis, Routine w reflex microscopic   Collection Time: 02/14/14  8:22 PM  Result Value Ref Range   Color, Urine STRAW (A) YELLOW   APPearance CLEAR CLEAR   Specific Gravity, Urine <1.005 (L) 1.005 - 1.030   pH 5.5 5.0 - 8.0   Glucose, UA NEGATIVE NEGATIVE mg/dL   Hgb urine dipstick NEGATIVE NEGATIVE   Bilirubin Urine NEGATIVE NEGATIVE   Ketones, ur NEGATIVE NEGATIVE mg/dL   Protein, ur NEGATIVE NEGATIVE mg/dL   Urobilinogen, UA 0.2 0.0 - 1.0 mg/dL   Nitrite NEGATIVE NEGATIVE   Leukocytes, UA NEGATIVE NEGATIVE   she had labs done last week with her primary care physician we will get the result Diagnoses 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 We'll continue Wellbutrin and Paxil for depression, Lamictal for mood stabilization, Risperdal for agitation and Cogentin for side effects from Risperdal. She'll take clonazepam 0.5 mg daily at 5 PM for at anxiety Discussed the risk and benefits of the medication in detail.    Time spent 15 minutes.  More than 50% of  the time spent and psychoeducation, counseling and coordination of care. She'll return in 3 months  MEDICATIONS this encounter: Meds ordered this encounter  Medications  . Calcium Carb-Vit D-C-E-Mineral (OS-CAL ULTRA) 600 MG TABS    Sig: Take by mouth 2 (two) times daily.  Marland Kitchen oxybutynin (DITROPAN-XL) 10 MG 24 hr tablet    Sig: Take 10 mg by mouth at bedtime.  .  nitrofurantoin, macrocrystal-monohydrate, (MACROBID) 100 MG capsule    Sig: Take 100 mg by mouth 2 (two) times daily.  . risperiDONE (RISPERDAL) 1 MG tablet    Sig: Take 1 tablet (1 mg total) by mouth at bedtime.    Dispense:  30 tablet    Refill:  2  . benztropine (COGENTIN) 0.5 MG tablet    Sig: Take 1 tablet (0.5 mg total) by mouth 2 (two) times daily.    Dispense:  60 tablet    Refill:  2  . lamoTRIgine (LAMICTAL) 100 MG tablet    Sig: Take 1 tablet (100 mg total) by mouth 2 (two) times daily.    Dispense:  60 tablet    Refill:  2  . buPROPion (WELLBUTRIN XL) 300 MG 24 hr tablet    Sig: Take 1 tablet (300 mg total) by mouth every morning.    Dispense:  30 tablet    Refill:  2  . PARoxetine (PAXIL) 40 MG tablet    Sig: Take 1 tablet (40 mg total) by mouth at bedtime.    Dispense:  30 tablet    Refill:  2  . risperiDONE (RISPERDAL) 0.25 MG tablet    Sig: Take 1 tablet (0.25 mg total) by mouth 3 (three) times daily. Takes at 8:00AM, 2:00PM, 5:00PM.    Dispense:  90 tablet    Refill:  2  . clonazePAM (KLONOPIN) 0.5 MG tablet    Sig: Take daily at 5 pm    Dispense:  30 tablet    Refill:  2    Medical Decision Making Problem Points:  Established problem, stable/improving (1), Established problem, worsening (2), New problem, with additional work-up planned (4), Review of last therapy session (1) and Review of psycho-social stressors (1) Data Points:  Review of medication regiment & side effects (2) Review of new medications or change in dosage (2)  I certify that outpatient services furnished can reasonably be expected to improve the patient's condition.   Diannia Ruder, MD

## 2014-12-14 DIAGNOSIS — L89613 Pressure ulcer of right heel, stage 3: Secondary | ICD-10-CM | POA: Diagnosis not present

## 2014-12-21 DIAGNOSIS — L89613 Pressure ulcer of right heel, stage 3: Secondary | ICD-10-CM | POA: Diagnosis not present

## 2014-12-26 DIAGNOSIS — N183 Chronic kidney disease, stage 3 (moderate): Secondary | ICD-10-CM | POA: Diagnosis not present

## 2014-12-26 DIAGNOSIS — L89603 Pressure ulcer of unspecified heel, stage 3: Secondary | ICD-10-CM | POA: Diagnosis not present

## 2014-12-26 DIAGNOSIS — F3130 Bipolar disorder, current episode depressed, mild or moderate severity, unspecified: Secondary | ICD-10-CM | POA: Diagnosis not present

## 2014-12-26 DIAGNOSIS — D649 Anemia, unspecified: Secondary | ICD-10-CM | POA: Diagnosis not present

## 2014-12-28 DIAGNOSIS — L89613 Pressure ulcer of right heel, stage 3: Secondary | ICD-10-CM | POA: Diagnosis not present

## 2014-12-29 DIAGNOSIS — N329 Bladder disorder, unspecified: Secondary | ICD-10-CM | POA: Diagnosis not present

## 2014-12-29 DIAGNOSIS — F99 Mental disorder, not otherwise specified: Secondary | ICD-10-CM | POA: Diagnosis not present

## 2014-12-29 DIAGNOSIS — I129 Hypertensive chronic kidney disease with stage 1 through stage 4 chronic kidney disease, or unspecified chronic kidney disease: Secondary | ICD-10-CM | POA: Diagnosis not present

## 2014-12-29 DIAGNOSIS — N183 Chronic kidney disease, stage 3 (moderate): Secondary | ICD-10-CM | POA: Diagnosis not present

## 2014-12-30 ENCOUNTER — Telehealth (HOSPITAL_COMMUNITY): Payer: Self-pay | Admitting: *Deleted

## 2014-12-30 NOTE — Telephone Encounter (Signed)
Opened in Error.

## 2014-12-30 NOTE — Telephone Encounter (Signed)
According to times messages was sent, the second note 12-30-14 at 9:54am was not meant to be put into pt chart due to it being copy and pasted. The First message 12-30-14 9:44am that was responded to was the original message that was sent was suppose to be the only message.

## 2014-12-30 NOTE — Telephone Encounter (Signed)
Wrong patient please correct

## 2014-12-30 NOTE — Telephone Encounter (Addendum)
Kirt Boys called stating she want to verify pt appt. Informed her of date and Kirt Boys stated that's not what she have on file. Informed Molly to contact pt sister due to what given to her. Kirt Boys called back and stated pt sister do not remember. Asked Kirt Boys if she remembered her conversation with office about changing pt appt to a sooner appt. Per Kirt Boys, she do not remember. Informed Kirt Boys the time and date pt f/u appt was with office. Molly agreed.

## 2015-01-03 ENCOUNTER — Ambulatory Visit (HOSPITAL_COMMUNITY): Payer: Self-pay | Admitting: Psychiatry

## 2015-01-04 DIAGNOSIS — L89613 Pressure ulcer of right heel, stage 3: Secondary | ICD-10-CM | POA: Diagnosis not present

## 2015-01-09 DIAGNOSIS — F341 Dysthymic disorder: Secondary | ICD-10-CM | POA: Diagnosis not present

## 2015-01-09 DIAGNOSIS — D649 Anemia, unspecified: Secondary | ICD-10-CM | POA: Diagnosis not present

## 2015-01-09 DIAGNOSIS — L89603 Pressure ulcer of unspecified heel, stage 3: Secondary | ICD-10-CM | POA: Diagnosis not present

## 2015-01-09 DIAGNOSIS — F3130 Bipolar disorder, current episode depressed, mild or moderate severity, unspecified: Secondary | ICD-10-CM | POA: Diagnosis not present

## 2015-01-11 DIAGNOSIS — L89613 Pressure ulcer of right heel, stage 3: Secondary | ICD-10-CM | POA: Diagnosis not present

## 2015-01-17 DIAGNOSIS — L89603 Pressure ulcer of unspecified heel, stage 3: Secondary | ICD-10-CM | POA: Diagnosis not present

## 2015-01-17 DIAGNOSIS — M129 Arthropathy, unspecified: Secondary | ICD-10-CM | POA: Diagnosis not present

## 2015-01-17 DIAGNOSIS — D649 Anemia, unspecified: Secondary | ICD-10-CM | POA: Diagnosis not present

## 2015-01-17 DIAGNOSIS — F3130 Bipolar disorder, current episode depressed, mild or moderate severity, unspecified: Secondary | ICD-10-CM | POA: Diagnosis not present

## 2015-01-17 DIAGNOSIS — F341 Dysthymic disorder: Secondary | ICD-10-CM | POA: Diagnosis not present

## 2015-01-18 DIAGNOSIS — L89613 Pressure ulcer of right heel, stage 3: Secondary | ICD-10-CM | POA: Diagnosis not present

## 2015-01-20 DIAGNOSIS — K5900 Constipation, unspecified: Secondary | ICD-10-CM | POA: Diagnosis not present

## 2015-01-23 DIAGNOSIS — L89603 Pressure ulcer of unspecified heel, stage 3: Secondary | ICD-10-CM | POA: Diagnosis not present

## 2015-01-23 DIAGNOSIS — D649 Anemia, unspecified: Secondary | ICD-10-CM | POA: Diagnosis not present

## 2015-01-23 DIAGNOSIS — N183 Chronic kidney disease, stage 3 (moderate): Secondary | ICD-10-CM | POA: Diagnosis not present

## 2015-01-25 DIAGNOSIS — L89613 Pressure ulcer of right heel, stage 3: Secondary | ICD-10-CM | POA: Diagnosis not present

## 2015-01-31 DIAGNOSIS — M129 Arthropathy, unspecified: Secondary | ICD-10-CM | POA: Diagnosis not present

## 2015-01-31 DIAGNOSIS — L89603 Pressure ulcer of unspecified heel, stage 3: Secondary | ICD-10-CM | POA: Diagnosis not present

## 2015-01-31 DIAGNOSIS — F3130 Bipolar disorder, current episode depressed, mild or moderate severity, unspecified: Secondary | ICD-10-CM | POA: Diagnosis not present

## 2015-01-31 DIAGNOSIS — D649 Anemia, unspecified: Secondary | ICD-10-CM | POA: Diagnosis not present

## 2015-02-01 DIAGNOSIS — L89613 Pressure ulcer of right heel, stage 3: Secondary | ICD-10-CM | POA: Diagnosis not present

## 2015-02-08 DIAGNOSIS — L89613 Pressure ulcer of right heel, stage 3: Secondary | ICD-10-CM | POA: Diagnosis not present

## 2015-02-13 DIAGNOSIS — N390 Urinary tract infection, site not specified: Secondary | ICD-10-CM | POA: Diagnosis not present

## 2015-02-14 DIAGNOSIS — R05 Cough: Secondary | ICD-10-CM | POA: Diagnosis not present

## 2015-02-14 DIAGNOSIS — N390 Urinary tract infection, site not specified: Secondary | ICD-10-CM | POA: Diagnosis not present

## 2015-02-14 DIAGNOSIS — R0902 Hypoxemia: Secondary | ICD-10-CM | POA: Diagnosis not present

## 2015-02-14 DIAGNOSIS — N183 Chronic kidney disease, stage 3 (moderate): Secondary | ICD-10-CM | POA: Diagnosis not present

## 2015-02-15 DIAGNOSIS — L89613 Pressure ulcer of right heel, stage 3: Secondary | ICD-10-CM | POA: Diagnosis not present

## 2015-02-17 DIAGNOSIS — R05 Cough: Secondary | ICD-10-CM | POA: Diagnosis not present

## 2015-02-17 DIAGNOSIS — N183 Chronic kidney disease, stage 3 (moderate): Secondary | ICD-10-CM | POA: Diagnosis not present

## 2015-02-17 DIAGNOSIS — N390 Urinary tract infection, site not specified: Secondary | ICD-10-CM | POA: Diagnosis not present

## 2015-02-17 DIAGNOSIS — R0902 Hypoxemia: Secondary | ICD-10-CM | POA: Diagnosis not present

## 2015-02-20 DIAGNOSIS — N390 Urinary tract infection, site not specified: Secondary | ICD-10-CM | POA: Diagnosis not present

## 2015-02-20 DIAGNOSIS — D649 Anemia, unspecified: Secondary | ICD-10-CM | POA: Diagnosis not present

## 2015-02-20 DIAGNOSIS — F3130 Bipolar disorder, current episode depressed, mild or moderate severity, unspecified: Secondary | ICD-10-CM | POA: Diagnosis not present

## 2015-02-20 DIAGNOSIS — N183 Chronic kidney disease, stage 3 (moderate): Secondary | ICD-10-CM | POA: Diagnosis not present

## 2015-02-21 DIAGNOSIS — R509 Fever, unspecified: Secondary | ICD-10-CM | POA: Diagnosis not present

## 2015-02-22 DIAGNOSIS — L97229 Non-pressure chronic ulcer of left calf with unspecified severity: Secondary | ICD-10-CM | POA: Diagnosis not present

## 2015-02-22 DIAGNOSIS — N390 Urinary tract infection, site not specified: Secondary | ICD-10-CM | POA: Diagnosis not present

## 2015-02-22 DIAGNOSIS — F3130 Bipolar disorder, current episode depressed, mild or moderate severity, unspecified: Secondary | ICD-10-CM | POA: Diagnosis not present

## 2015-02-22 DIAGNOSIS — J159 Unspecified bacterial pneumonia: Secondary | ICD-10-CM | POA: Diagnosis not present

## 2015-02-22 DIAGNOSIS — R509 Fever, unspecified: Secondary | ICD-10-CM | POA: Diagnosis not present

## 2015-02-22 DIAGNOSIS — R05 Cough: Secondary | ICD-10-CM | POA: Diagnosis not present

## 2015-02-23 DIAGNOSIS — F3130 Bipolar disorder, current episode depressed, mild or moderate severity, unspecified: Secondary | ICD-10-CM | POA: Diagnosis not present

## 2015-02-23 DIAGNOSIS — N390 Urinary tract infection, site not specified: Secondary | ICD-10-CM | POA: Diagnosis not present

## 2015-02-23 DIAGNOSIS — R509 Fever, unspecified: Secondary | ICD-10-CM | POA: Diagnosis not present

## 2015-02-23 DIAGNOSIS — J159 Unspecified bacterial pneumonia: Secondary | ICD-10-CM | POA: Diagnosis not present

## 2015-02-25 DIAGNOSIS — F3130 Bipolar disorder, current episode depressed, mild or moderate severity, unspecified: Secondary | ICD-10-CM | POA: Diagnosis not present

## 2015-02-25 DIAGNOSIS — J159 Unspecified bacterial pneumonia: Secondary | ICD-10-CM | POA: Diagnosis not present

## 2015-02-28 ENCOUNTER — Ambulatory Visit (HOSPITAL_COMMUNITY): Payer: Self-pay | Admitting: Psychiatry

## 2015-02-28 DIAGNOSIS — D649 Anemia, unspecified: Secondary | ICD-10-CM | POA: Diagnosis not present

## 2015-02-28 DIAGNOSIS — J159 Unspecified bacterial pneumonia: Secondary | ICD-10-CM | POA: Diagnosis not present

## 2015-02-28 DIAGNOSIS — N183 Chronic kidney disease, stage 3 (moderate): Secondary | ICD-10-CM | POA: Diagnosis not present

## 2015-02-28 DIAGNOSIS — F3130 Bipolar disorder, current episode depressed, mild or moderate severity, unspecified: Secondary | ICD-10-CM | POA: Diagnosis not present

## 2015-03-01 ENCOUNTER — Ambulatory Visit (INDEPENDENT_AMBULATORY_CARE_PROVIDER_SITE_OTHER): Payer: 59 | Admitting: Psychiatry

## 2015-03-01 ENCOUNTER — Encounter (HOSPITAL_COMMUNITY): Payer: Self-pay | Admitting: Psychiatry

## 2015-03-01 VITALS — BP 109/65 | HR 73 | Ht 64.0 in

## 2015-03-01 DIAGNOSIS — L97229 Non-pressure chronic ulcer of left calf with unspecified severity: Secondary | ICD-10-CM | POA: Diagnosis not present

## 2015-03-01 DIAGNOSIS — F5105 Insomnia due to other mental disorder: Secondary | ICD-10-CM

## 2015-03-01 DIAGNOSIS — F489 Nonpsychotic mental disorder, unspecified: Secondary | ICD-10-CM | POA: Diagnosis not present

## 2015-03-01 DIAGNOSIS — F331 Major depressive disorder, recurrent, moderate: Secondary | ICD-10-CM

## 2015-03-01 MED ORDER — PAROXETINE HCL 40 MG PO TABS: 40.0000 mg | ORAL_TABLET | Freq: Every day | ORAL | Status: DC

## 2015-03-01 MED ORDER — CLONAZEPAM 0.5 MG PO TABS: ORAL_TABLET | ORAL | Status: DC

## 2015-03-01 MED ORDER — BUPROPION HCL ER (XL) 300 MG PO TB24: 300.0000 mg | ORAL_TABLET | ORAL | Status: DC

## 2015-03-01 MED ORDER — BENZTROPINE MESYLATE 0.5 MG PO TABS: 0.5000 mg | ORAL_TABLET | Freq: Two times a day (BID) | ORAL | Status: DC

## 2015-03-01 MED ORDER — RISPERIDONE 1 MG PO TABS: 1.0000 mg | ORAL_TABLET | Freq: Every day | ORAL | Status: DC

## 2015-03-01 MED ORDER — RISPERIDONE 0.25 MG PO TABS: 0.2500 mg | ORAL_TABLET | Freq: Three times a day (TID) | ORAL | Status: DC

## 2015-03-01 MED ORDER — LAMOTRIGINE 100 MG PO TABS: 100.0000 mg | ORAL_TABLET | Freq: Two times a day (BID) | ORAL | Status: DC

## 2015-03-01 NOTE — Progress Notes (Signed)
Patient ID: Amy Clay, female   DOB: [birth year], 74 y.o.   MRN: [id number] Patient ID: Amy Clay, female   DOB: 07-12-1940, 74 y.o.   MRN: [id number] Patient ID: Amy Clay, female   DOB: October 12, 1940, 74 y.o.   MRN: [id number] Patient ID: Amy Clay, female   DOB: [birth year], 74 y.o.   MRN: [id number] Patient ID: Amy Clay, female   DOB: 22-Nov-1940, 74 y.o.   MRN: [id number] Patient ID: Amy Clay, female   DOB: 1941-01-30, 74 y.o.   MRN: [id number] Patient ID: Amy Clay, female   DOB: 08-12-1940, 74 y.o.   MRN: [id number] Patient ID: Amy Clay, female   DOB: 05/15/1940, 74 y.o.   MRN: [id number] Patient ID: Amy Clay, female   DOB: [birth year], 74 y.o.   MRN: [id number] Patient ID: Amy Clay, female   DOB: [birth year], 74 y.o.   MRN: [id number] Patient ID: Amy Clay, female   DOB: [birth year], 73 y.o.   MRN: [id number] Patient ID: Amy Clay, female   DOB: 1940/05/19, 74 y.o.   MRN: [id number] [facility] [address] Progress Note Amy Clay MRN: [id number] DOB: 1941/01/16 Age: 74 y.o.  Date: 03/01/2015  Chief Complaint  Patient presents with  . Depression  . Anxiety  . Memory Loss  . Follow-up   History of presenting illness Patient is 74 year old Caucasian female who came with her sister for her followup appointment. She lives in the [facility] assisted care facility. She and her sister had been living together in the family farm outside of [place]. Neither one has ever been married or have had children. The patient worked until the early 80s when she began to have difficulties with her nerves.  Currently the patient is still struggling with her mental and physical health. She was admitted last year to a psychiatric hospital in [place] because she was anxious nervous and depressed. Last time Dr. Lolly Mustache increased her Risperdal which is helped somewhat. She can't walk and it's not clear why much of it seems  to be anxiety based and she seems to have a fear of falling. She's also recently developed possible renal failure and is can be referred to a nephrologist. She was on lithium for a number of years. We do not have her  recent records from her primary care physician who is outside of [place]  The patient returns after 3 months. She is here with her sister. She now lives in the [facility] nursing facility apparently last week she had both pneumonia and a urinary tract infection. She was not in good spirits during that time but she's feeling better now. She was smiling most of the time today and only cried a little bit. She's not had any significant behavioral problems. She continues on antibiotics and has a urinary catheter placed. I don't see the need to make any medication changes at this time  Past psychiatric history Patient is seeing psychiatrist since 1997 in this office.  Her last psychiatric admission was at [facility] after having suicidal thoughts.  She denies any history of suicidal attempt however endorse history of hopeless feeling.  She is diagnosed with major depressive disorder. She she was very stable on lithium until her creatinine went up and lithium was discontinued.  We had recently tried Lamictal and the dose has been increased recently.  In the past we had tried BuSpar which was discontinued at hospital.  She has another psychiatric inpatient treatment  in 90s. She admitted history of mania, depression and psychosis.  Social history Patient lives in a nursing home.  She has no children.  Her husband died in nursing home.  Patient has no other family.  Medical history Patient was recently admitted due to dehydration and UTI.  Patient has history of arthritis blood pressure and chronic pain. Her primary care physician is Dr. Phillips Odor at [facility].  She takes medication for her blood pressure and arthritis.  She had history of jerking movements, fall and  generalized pain. Her last blood work was done on 02/14/2012 which shows anemia, her creatinine was 1.33.  She was given antibiotic result UTI.  Her WBC count was 10.4.  Family History family history includes Anxiety disorder in her paternal aunt and paternal uncle; Arthritis in an other family member; Asthma in an other family member; Bipolar disorder in her cousin; Diabetes in an other family member. There is no history of Colon cancer.  ROS   Mental status examination Patient is casually dressed and fairly groomed. She is in a wheelchair   She described her mood as ok.Her voice is high-pitched and soft.  .Her affect is fairly bright and she is smiling more today. He cried a little bit when talking about missing her home but was easily treat directed  Her attention and concentration are poor today  She expresses herself well .  She doesn't have auditory or visual hallucination.  There were no paranoia or delusion obsession present at this time.  Her fund of knowledge is okay.  There are no tremors and shakes in her hand.  She's alert and oriented x3 needs to be reminded about the month. She is aware that we are in the summertime. Her insight judgment are poor and impulse control is okay.  Lab Results:  Results for orders placed or performed during the hospital encounter of 08/18/14 (from the past 8736 hour(s))  POC CBG, ED   Collection Time: 08/19/14 12:13 AM  Result Value Ref Range   Glucose-Capillary 62 (L) 70 - 99 mg/dL   Comment 1 Call MD NNP PA CNM   Urinalysis, Routine w reflex microscopic   Collection Time: 08/19/14 12:32 AM  Result Value Ref Range   Color, Urine YELLOW YELLOW   APPearance CLEAR CLEAR   Specific Gravity, Urine 1.025 1.005 - 1.030   pH 5.5 5.0 - 8.0   Glucose, UA NEGATIVE NEGATIVE mg/dL   Hgb urine dipstick NEGATIVE NEGATIVE   Bilirubin Urine NEGATIVE NEGATIVE   Ketones, ur NEGATIVE NEGATIVE mg/dL   Protein, ur NEGATIVE NEGATIVE mg/dL   Urobilinogen, UA 0.2 0.0  - 1.0 mg/dL   Nitrite NEGATIVE NEGATIVE   Leukocytes, UA NEGATIVE NEGATIVE  Culture, blood (routine x 2)   Collection Time: 08/19/14 12:36 AM  Result Value Ref Range   Specimen Description BLOOD RIGHT ANTECUBITAL    Special Requests BOTTLES DRAWN AEROBIC AND ANAEROBIC 10CC EACH    Culture NO GROWTH 5 DAYS    Report Status 08/24/2014 FINAL   Comprehensive metabolic panel   Collection Time: 08/19/14 12:36 AM  Result Value Ref Range   Sodium 140 135 - 145 mmol/L   Potassium 5.9 (H) 3.5 - 5.1 mmol/L   Chloride 107 96 - 112 mmol/L   CO2 28 19 - 32 mmol/L   Glucose, Bld 72 70 - 99 mg/dL   BUN 55 (H) 6 - 23 mg/dL   Creatinine, Ser 8.11 (H) 0.50 - 1.10 mg/dL   Calcium 9.6 8.4 -  10.5 mg/dL   Total Protein 6.4 6.0 - 8.3 g/dL   Albumin 3.2 (L) 3.5 - 5.2 g/dL   AST 44 (H) 0 - 37 U/L   ALT 54 (H) 0 - 35 U/L   Alkaline Phosphatase 134 (H) 39 - 117 U/L   Total Bilirubin 0.3 0.3 - 1.2 mg/dL   GFR calc non Af Amer 30 (L) >90 mL/min   GFR calc Af Amer 34 (L) >90 mL/min   Anion gap 5 5 - 15  CBC with Differential   Collection Time: 08/19/14 12:36 AM  Result Value Ref Range   WBC 4.5 4.0 - 10.5 K/uL   RBC 3.07 (L) 3.87 - 5.11 MIL/uL   Hemoglobin 9.1 (L) 12.0 - 15.0 g/dL   HCT 16.1 (L) 09.6 - 04.5 %   MCV 98.0 78.0 - 100.0 fL   MCH 29.6 26.0 - 34.0 pg   MCHC 30.2 30.0 - 36.0 g/dL   RDW 40.9 (H) 81.1 - 91.4 %   Platelets 132 (L) 150 - 400 K/uL   Neutrophils Relative % 70 43 - 77 %   Neutro Abs 3.1 1.7 - 7.7 K/uL   Lymphocytes Relative 19 12 - 46 %   Lymphs Abs 0.8 0.7 - 4.0 K/uL   Monocytes Relative 7 3 - 12 %   Monocytes Absolute 0.3 0.1 - 1.0 K/uL   Eosinophils Relative 4 0 - 5 %   Eosinophils Absolute 0.2 0.0 - 0.7 K/uL   Basophils Relative 0 0 - 1 %   Basophils Absolute 0.0 0.0 - 0.1 K/uL  TSH   Collection Time: 08/19/14 12:36 AM  Result Value Ref Range   TSH 2.751 0.350 - 4.500 uIU/mL  I-Stat CG4 Lactic Acid, ED   Collection Time: 08/19/14 12:44 AM  Result Value Ref Range    Lactic Acid, Venous 0.78 0.5 - 2.0 mmol/L  Culture, blood (routine x 2)   Collection Time: 08/19/14 12:50 AM  Result Value Ref Range   Specimen Description BLOOD LEFT HAND    Special Requests      BOTTLES DRAWN AEROBIC AND ANAEROBIC AEB=10CC ANA=5CC   Culture NO GROWTH 5 DAYS    Report Status 08/24/2014 FINAL   CBG monitoring, ED   Collection Time: 08/19/14  1:04 AM  Result Value Ref Range   Glucose-Capillary 79 70 - 99 mg/dL  CBG monitoring, ED   Collection Time: 08/19/14  2:25 AM  Result Value Ref Range   Glucose-Capillary 82 70 - 99 mg/dL  MRSA PCR Screening   Collection Time: 08/19/14  3:35 AM  Result Value Ref Range   MRSA by PCR NEGATIVE NEGATIVE  CBC   Collection Time: 08/19/14  5:38 AM  Result Value Ref Range   WBC 4.0 4.0 - 10.5 K/uL   RBC 3.06 (L) 3.87 - 5.11 MIL/uL   Hemoglobin 8.9 (L) 12.0 - 15.0 g/dL   HCT 78.2 (L) 95.6 - 21.3 %   MCV 97.4 78.0 - 100.0 fL   MCH 29.1 26.0 - 34.0 pg   MCHC 29.9 (L) 30.0 - 36.0 g/dL   RDW 08.6 (H) 57.8 - 46.9 %   Platelets 143 (L) 150 - 400 K/uL  Comprehensive metabolic panel   Collection Time: 08/19/14  5:38 AM  Result Value Ref Range   Sodium 143 135 - 145 mmol/L   Potassium 5.8 (H) 3.5 - 5.1 mmol/L   Chloride 110 96 - 112 mmol/L   CO2 27 19 - 32 mmol/L   Glucose, Bld 89  70 - 99 mg/dL   BUN 54 (H) 6 - 23 mg/dL   Creatinine, Ser 8.291.53 (H) 0.50 - 1.10 mg/dL   Calcium 9.8 8.4 - 56.210.5 mg/dL   Total Protein 6.3 6.0 - 8.3 g/dL   Albumin 3.1 (L) 3.5 - 5.2 g/dL   AST 45 (H) 0 - 37 U/L   ALT 54 (H) 0 - 35 U/L   Alkaline Phosphatase 132 (H) 39 - 117 U/L   Total Bilirubin 0.3 0.3 - 1.2 mg/dL   GFR calc non Af Amer 32 (L) >90 mL/min   GFR calc Af Amer 37 (L) >90 mL/min   Anion gap 6 5 - 15  Glucose, capillary   Collection Time: 08/19/14 11:11 AM  Result Value Ref Range   Glucose-Capillary 64 (L) 70 - 99 mg/dL   Comment 1 Notify RN    Comment 2 Document in Chart   Glucose, capillary   Collection Time: 08/19/14 11:51 AM   Result Value Ref Range   Glucose-Capillary 75 70 - 99 mg/dL  Cortisol-am, blood   Collection Time: 08/19/14  3:43 PM  Result Value Ref Range   Cortisol - AM 16.2 4.3 - 22.4 ug/dL  T4, free   Collection Time: 08/19/14  3:43 PM  Result Value Ref Range   Free T4 0.97 0.80 - 1.80 ng/dL  Potassium   Collection Time: 08/19/14  3:49 PM  Result Value Ref Range   Potassium 5.4 (H) 3.5 - 5.1 mmol/L  Glucose, capillary   Collection Time: 08/19/14  4:53 PM  Result Value Ref Range   Glucose-Capillary 87 70 - 99 mg/dL   Comment 1 Notify RN    Comment 2 Document in Chart   Glucose, capillary   Collection Time: 08/19/14  9:57 PM  Result Value Ref Range   Glucose-Capillary 77 70 - 99 mg/dL  Glucose, capillary   Collection Time: 08/20/14  5:05 AM  Result Value Ref Range   Glucose-Capillary 73 70 - 99 mg/dL   Comment 1 Notify RN   Basic metabolic panel   Collection Time: 08/20/14  5:08 AM  Result Value Ref Range   Sodium 141 135 - 145 mmol/L   Potassium 5.0 3.5 - 5.1 mmol/L   Chloride 106 96 - 112 mmol/L   CO2 27 19 - 32 mmol/L   Glucose, Bld 75 70 - 99 mg/dL   BUN 47 (H) 6 - 23 mg/dL   Creatinine, Ser 1.301.82 (H) 0.50 - 1.10 mg/dL   Calcium 9.6 8.4 - 86.510.5 mg/dL   GFR calc non Af Amer 26 (L) >90 mL/min   GFR calc Af Amer 30 (L) >90 mL/min   Anion gap 8 5 - 15  CBC   Collection Time: 08/20/14  5:08 AM  Result Value Ref Range   WBC 4.6 4.0 - 10.5 K/uL   RBC 3.27 (L) 3.87 - 5.11 MIL/uL   Hemoglobin 9.4 (L) 12.0 - 15.0 g/dL   HCT 78.431.6 (L) 69.636.0 - 29.546.0 %   MCV 96.6 78.0 - 100.0 fL   MCH 28.7 26.0 - 34.0 pg   MCHC 29.7 (L) 30.0 - 36.0 g/dL   RDW 28.417.3 (H) 13.211.5 - 44.015.5 %   Platelets 162 150 - 400 K/uL  Hemoglobin A1c   Collection Time: 08/20/14  5:08 AM  Result Value Ref Range   Hgb A1c MFr Bld 5.5 4.8 - 5.6 %   Mean Plasma Glucose 111 mg/dL  Glucose, capillary   Collection Time: 08/20/14  7:23 AM  Result  Value Ref Range   Glucose-Capillary 83 70 - 99 mg/dL   Comment 1 Notify RN     Comment 2 Document in Chart   Vitamin B12   Collection Time: 08/20/14  8:59 AM  Result Value Ref Range   Vitamin B-12 1437 (H) 211 - 911 pg/mL  Iron and TIBC   Collection Time: 08/20/14  8:59 AM  Result Value Ref Range   Iron 118 42 - 145 ug/dL   TIBC 409 811 - 914 ug/dL   Saturation Ratios 35 20 - 55 %   UIBC 220 125 - 400 ug/dL  Ferritin   Collection Time: 08/20/14  8:59 AM  Result Value Ref Range   Ferritin 58 10 - 291 ng/mL  Glucose, capillary   Collection Time: 08/20/14 11:35 AM  Result Value Ref Range   Glucose-Capillary 102 (H) 70 - 99 mg/dL  Glucose, capillary   Collection Time: 08/20/14  4:45 PM  Result Value Ref Range   Glucose-Capillary 86 70 - 99 mg/dL   Comment 1 Notify RN    Comment 2 Document in Chart   Glucose, capillary   Collection Time: 08/20/14  7:52 PM  Result Value Ref Range   Glucose-Capillary 97 70 - 99 mg/dL  Glucose, capillary   Collection Time: 08/21/14  2:09 AM  Result Value Ref Range   Glucose-Capillary 97 70 - 99 mg/dL  Glucose, capillary   Collection Time: 08/21/14  4:45 AM  Result Value Ref Range   Glucose-Capillary 81 70 - 99 mg/dL  CBC   Collection Time: 08/21/14  6:19 AM  Result Value Ref Range   WBC 4.9 4.0 - 10.5 K/uL   RBC 3.23 (L) 3.87 - 5.11 MIL/uL   Hemoglobin 9.5 (L) 12.0 - 15.0 g/dL   HCT 78.2 (L) 95.6 - 21.3 %   MCV 96.3 78.0 - 100.0 fL   MCH 29.4 26.0 - 34.0 pg   MCHC 30.5 30.0 - 36.0 g/dL   RDW 08.6 (H) 57.8 - 46.9 %   Platelets 152 150 - 400 K/uL  Basic metabolic panel   Collection Time: 08/21/14  6:19 AM  Result Value Ref Range   Sodium 143 135 - 145 mmol/L   Potassium 4.1 3.5 - 5.1 mmol/L   Chloride 107 96 - 112 mmol/L   CO2 28 19 - 32 mmol/L   Glucose, Bld 88 70 - 99 mg/dL   BUN 37 (H) 6 - 23 mg/dL   Creatinine, Ser 6.29 (H) 0.50 - 1.10 mg/dL   Calcium 9.8 8.4 - 52.8 mg/dL   GFR calc non Af Amer 25 (L) >90 mL/min   GFR calc Af Amer 29 (L) >90 mL/min   Anion gap 8 5 - 15  Glucose, capillary   Collection  Time: 08/21/14  7:39 AM  Result Value Ref Range   Glucose-Capillary 79 70 - 99 mg/dL   Comment 1 Notify RN    Comment 2 Document in Chart   Glucose, capillary   Collection Time: 08/21/14 11:29 AM  Result Value Ref Range   Glucose-Capillary 95 70 - 99 mg/dL   Comment 1 Notify RN    Comment 2 Document in Chart   Glucose, capillary   Collection Time: 08/21/14  4:23 PM  Result Value Ref Range   Glucose-Capillary 102 (H) 70 - 99 mg/dL   Comment 1 Notify RN    Comment 2 Document in Chart   Glucose, capillary   Collection Time: 08/21/14  9:02 PM  Result Value Ref Range  Glucose-Capillary 97 70 - 99 mg/dL   Comment 1 Notify RN   Basic metabolic panel   Collection Time: 08/22/14  6:27 AM  Result Value Ref Range   Sodium 143 135 - 145 mmol/L   Potassium 3.9 3.5 - 5.1 mmol/L   Chloride 107 96 - 112 mmol/L   CO2 27 19 - 32 mmol/L   Glucose, Bld 94 70 - 99 mg/dL   BUN 33 (H) 6 - 23 mg/dL   Creatinine, Ser 4.54 (H) 0.50 - 1.10 mg/dL   Calcium 9.6 8.4 - 09.8 mg/dL   GFR calc non Af Amer 28 (L) >90 mL/min   GFR calc Af Amer 33 (L) >90 mL/min   Anion gap 9 5 - 15  Glucose, capillary   Collection Time: 08/22/14  7:33 AM  Result Value Ref Range   Glucose-Capillary 87 70 - 99 mg/dL  Glucose, capillary   Collection Time: 08/22/14 11:38 AM  Result Value Ref Range   Glucose-Capillary 77 70 - 99 mg/dL  Results for orders placed or performed during the hospital encounter of 06/20/14 (from the past 8736 hour(s))  CBC with Differential   Collection Time: 06/20/14  9:43 PM  Result Value Ref Range   WBC 7.7 4.0 - 10.5 K/uL   RBC 3.64 (L) 3.87 - 5.11 MIL/uL   Hemoglobin 10.5 (L) 12.0 - 15.0 g/dL   HCT 11.9 (L) 14.7 - 82.9 %   MCV 96.4 78.0 - 100.0 fL   MCH 28.8 26.0 - 34.0 pg   MCHC 29.9 (L) 30.0 - 36.0 g/dL   RDW 56.2 13.0 - 86.5 %   Platelets 246 150 - 400 K/uL   Neutrophils Relative % 75 43 - 77 %   Neutro Abs 5.8 1.7 - 7.7 K/uL   Lymphocytes Relative 13 12 - 46 %   Lymphs Abs 1.0  0.7 - 4.0 K/uL   Monocytes Relative 9 3 - 12 %   Monocytes Absolute 0.7 0.1 - 1.0 K/uL   Eosinophils Relative 3 0 - 5 %   Eosinophils Absolute 0.2 0.0 - 0.7 K/uL   Basophils Relative 0 0 - 1 %   Basophils Absolute 0.0 0.0 - 0.1 K/uL  Comprehensive metabolic panel   Collection Time: 06/20/14  9:43 PM  Result Value Ref Range   Sodium 140 135 - 145 mmol/L   Potassium 4.2 3.5 - 5.1 mmol/L   Chloride 108 96 - 112 mmol/L   CO2 29 19 - 32 mmol/L   Glucose, Bld 113 (H) 70 - 99 mg/dL   BUN 36 (H) 6 - 23 mg/dL   Creatinine, Ser 7.84 (H) 0.50 - 1.10 mg/dL   Calcium 9.5 8.4 - 69.6 mg/dL   Total Protein 7.4 6.0 - 8.3 g/dL   Albumin 3.6 3.5 - 5.2 g/dL   AST 22 0 - 37 U/L   ALT 17 0 - 35 U/L   Alkaline Phosphatase 113 39 - 117 U/L   Total Bilirubin 0.3 0.3 - 1.2 mg/dL   GFR calc non Af Amer 28 (L) >90 mL/min   GFR calc Af Amer 33 (L) >90 mL/min   Anion gap 3 (L) 5 - 15  Results for orders placed or performed during the hospital encounter of 06/15/14 (from the past 8736 hour(s))  CBC with Differential   Collection Time: 06/15/14  5:35 PM  Result Value Ref Range   WBC 7.2 4.0 - 10.5 K/uL   RBC 3.61 (L) 3.87 - 5.11 MIL/uL   Hemoglobin 10.3 (  L) 12.0 - 15.0 g/dL   HCT 16.1 (L) 09.6 - 04.5 %   MCV 95.6 78.0 - 100.0 fL   MCH 28.5 26.0 - 34.0 pg   MCHC 29.9 (L) 30.0 - 36.0 g/dL   RDW 40.9 81.1 - 91.4 %   Platelets 236 150 - 400 K/uL   Neutrophils Relative % 74 43 - 77 %   Neutro Abs 5.3 1.7 - 7.7 K/uL   Lymphocytes Relative 15 12 - 46 %   Lymphs Abs 1.1 0.7 - 4.0 K/uL   Monocytes Relative 9 3 - 12 %   Monocytes Absolute 0.7 0.1 - 1.0 K/uL   Eosinophils Relative 2 0 - 5 %   Eosinophils Absolute 0.2 0.0 - 0.7 K/uL   Basophils Relative 0 0 - 1 %   Basophils Absolute 0.0 0.0 - 0.1 K/uL  I-Stat CG4 Lactic Acid, ED   Collection Time: 06/15/14  5:48 PM  Result Value Ref Range   Lactic Acid, Venous 0.92 0.5 - 2.0 mmol/L  Comprehensive metabolic panel   Collection Time: 06/15/14  6:40 PM   Result Value Ref Range   Sodium 141 135 - 145 mmol/L   Potassium 4.2 3.5 - 5.1 mmol/L   Chloride 107 96 - 112 mmol/L   CO2 28 19 - 32 mmol/L   Glucose, Bld 103 (H) 70 - 99 mg/dL   BUN 36 (H) 6 - 23 mg/dL   Creatinine, Ser 7.82 (H) 0.50 - 1.10 mg/dL   Calcium 9.8 8.4 - 95.6 mg/dL   Total Protein 7.2 6.0 - 8.3 g/dL   Albumin 3.7 3.5 - 5.2 g/dL   AST 23 0 - 37 U/L   ALT 18 0 - 35 U/L   Alkaline Phosphatase 124 (H) 39 - 117 U/L   Total Bilirubin 0.5 0.3 - 1.2 mg/dL   GFR calc non Af Amer 32 (L) >90 mL/min   GFR calc Af Amer 37 (L) >90 mL/min   Anion gap 6 5 - 15  Results for orders placed or performed during the hospital encounter of 04/28/14 (from the past 8736 hour(s))  Urine culture   Collection Time: 04/28/14  7:25 AM  Result Value Ref Range   Specimen Description URINE, CATHETERIZED    Special Requests NONE    Culture  Setup Time      04/28/2014 14:14 Performed at [facility]      >=100,000 COLONIES/ML Performed at [facility]    Culture      ESCHERICHIA COLI Performed at [facility]    Report Status 04/30/2014 FINAL    Organism ID, Bacteria ESCHERICHIA COLI       Susceptibility   Escherichia coli - MIC*    AMPICILLIN <=2 SENSITIVE Sensitive     CEFAZOLIN <=4 SENSITIVE Sensitive     CEFTRIAXONE <=1 SENSITIVE Sensitive     CIPROFLOXACIN <=0.25 SENSITIVE Sensitive     GENTAMICIN <=1 SENSITIVE Sensitive     LEVOFLOXACIN <=0.12 SENSITIVE Sensitive     NITROFURANTOIN <=16 SENSITIVE Sensitive     TOBRAMYCIN <=1 SENSITIVE Sensitive     TRIMETH/SULFA <=20 SENSITIVE Sensitive     PIP/TAZO <=4 SENSITIVE Sensitive     * ESCHERICHIA COLI  Urinalysis, Routine w reflex microscopic   Collection Time: 04/28/14  7:41 AM  Result Value Ref Range   Color, Urine YELLOW YELLOW   APPearance CLEAR CLEAR   Specific Gravity, Urine 1.015 1.005 - 1.030   pH 6.5 5.0 -  8.0   Glucose, UA NEGATIVE NEGATIVE mg/dL   Hgb urine dipstick TRACE (A)  NEGATIVE   Bilirubin Urine NEGATIVE NEGATIVE   Ketones, ur NEGATIVE NEGATIVE mg/dL   Protein, ur NEGATIVE NEGATIVE mg/dL   Urobilinogen, UA 0.2 0.0 - 1.0 mg/dL   Nitrite POSITIVE (A) NEGATIVE   Leukocytes, UA LARGE (A) NEGATIVE  Urine microscopic-add on   Collection Time: 04/28/14  7:41 AM  Result Value Ref Range   WBC, UA TOO NUMEROUS TO COUNT <3 WBC/hpf   RBC / HPF 3-6 <3 RBC/hpf   Bacteria, UA MANY (A) RARE  Basic metabolic panel   Collection Time: 04/28/14  7:44 AM  Result Value Ref Range   Sodium 142 137 - 147 mEq/L   Potassium 4.5 3.7 - 5.3 mEq/L   Chloride 105 96 - 112 mEq/L   CO2 28 19 - 32 mEq/L   Glucose, Bld 117 (H) 70 - 99 mg/dL   BUN 39 (H) 6 - 23 mg/dL   Creatinine, Ser 1.61 (H) 0.50 - 1.10 mg/dL   Calcium 9.9 8.4 - 09.6 mg/dL   GFR calc non Af Amer 31 (L) >90 mL/min   GFR calc Af Amer 36 (L) >90 mL/min   Anion gap 9 5 - 15  CBC with Differential   Collection Time: 04/28/14  7:44 AM  Result Value Ref Range   WBC 6.5 4.0 - 10.5 K/uL   RBC 3.33 (L) 3.87 - 5.11 MIL/uL   Hemoglobin 9.8 (L) 12.0 - 15.0 g/dL   HCT 04.5 (L) 40.9 - 81.1 %   MCV 95.8 78.0 - 100.0 fL   MCH 29.4 26.0 - 34.0 pg   MCHC 30.7 30.0 - 36.0 g/dL   RDW 91.4 (H) 78.2 - 95.6 %   Platelets 181 150 - 400 K/uL   Neutrophils Relative % 85 (H) 43 - 77 %   Neutro Abs 5.5 1.7 - 7.7 K/uL   Lymphocytes Relative 8 (L) 12 - 46 %   Lymphs Abs 0.5 (L) 0.7 - 4.0 K/uL   Monocytes Relative 5 3 - 12 %   Monocytes Absolute 0.4 0.1 - 1.0 K/uL   Eosinophils Relative 2 0 - 5 %   Eosinophils Absolute 0.1 0.0 - 0.7 K/uL   Basophils Relative 0 0 - 1 %   Basophils Absolute 0.0 0.0 - 0.1 K/uL  Results for orders placed or performed during the hospital encounter of 04/20/14 (from the past 8736 hour(s))  Lactic acid, plasma   Collection Time: 04/20/14  2:23 PM  Result Value Ref Range   Lactic Acid, Venous 1.2 0.5 - 2.2 mmol/L  CBC with Differential   Collection Time: 04/20/14  2:28 PM  Result Value Ref Range    WBC 5.3 4.0 - 10.5 K/uL   RBC 3.23 (L) 3.87 - 5.11 MIL/uL   Hemoglobin 9.5 (L) 12.0 - 15.0 g/dL   HCT 21.3 (L) 08.6 - 57.8 %   MCV 94.4 78.0 - 100.0 fL   MCH 29.4 26.0 - 34.0 pg   MCHC 31.1 30.0 - 36.0 g/dL   RDW 46.9 (H) 62.9 - 52.8 %   Platelets 198 150 - 400 K/uL   Neutrophils Relative % 66 43 - 77 %   Neutro Abs 3.5 1.7 - 7.7 K/uL   Lymphocytes Relative 21 12 - 46 %   Lymphs Abs 1.1 0.7 - 4.0 K/uL   Monocytes Relative 8 3 - 12 %   Monocytes Absolute 0.4 0.1 - 1.0 K/uL   Eosinophils  Relative 5 0 - 5 %   Eosinophils Absolute 0.3 0.0 - 0.7 K/uL   Basophils Relative 0 0 - 1 %   Basophils Absolute 0.0 0.0 - 0.1 K/uL  Basic metabolic panel   Collection Time: 04/20/14  2:28 PM  Result Value Ref Range   Sodium 142 137 - 147 mEq/L   Potassium 5.1 3.7 - 5.3 mEq/L   Chloride 104 96 - 112 mEq/L   CO2 29 19 - 32 mEq/L   Glucose, Bld 112 (H) 70 - 99 mg/dL   BUN 37 (H) 6 - 23 mg/dL   Creatinine, Ser 2.84 (H) 0.50 - 1.10 mg/dL   Calcium 13.2 8.4 - 44.0 mg/dL   GFR calc non Af Amer 28 (L) >90 mL/min   GFR calc Af Amer 32 (L) >90 mL/min   Anion gap 9 5 - 15  Troponin I   Collection Time: 04/20/14  2:28 PM  Result Value Ref Range   Troponin I <0.30 <0.30 ng/mL  Urine culture   Collection Time: 04/20/14  3:38 PM  Result Value Ref Range   Specimen Description URINE, CATHETERIZED    Special Requests NONE    Culture  Setup Time      04/21/2014 00:38 Performed at [facility]    Colony Count NO GROWTH Performed at [facility]     Culture NO GROWTH Performed at [facility]     Report Status 04/21/2014 FINAL   Urinalysis, Routine w reflex microscopic   Collection Time: 04/20/14  3:38 PM  Result Value Ref Range   Color, Urine YELLOW YELLOW   APPearance CLEAR CLEAR   Specific Gravity, Urine 1.020 1.005 - 1.030   pH 5.5 5.0 - 8.0   Glucose, UA NEGATIVE NEGATIVE mg/dL   Hgb urine dipstick NEGATIVE NEGATIVE   Bilirubin Urine NEGATIVE NEGATIVE   Ketones,  ur NEGATIVE NEGATIVE mg/dL   Protein, ur NEGATIVE NEGATIVE mg/dL   Urobilinogen, UA 0.2 0.0 - 1.0 mg/dL   Nitrite NEGATIVE NEGATIVE   Leukocytes, UA NEGATIVE NEGATIVE   she had labs done last week with her primary care physician we will get the result Diagnoses 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 We'll continue Wellbutrin and Paxil for depression, Lamictal for mood stabilization, Risperdal for agitation and Cogentin for side effects from Risperdal. She'll take clonazepam 0.5 mg daily at 5 PM for at anxiety Discussed the risk and benefits of the medication in detail.    Time spent 15 minutes.  More than 50% of the time spent and psychoeducation, counseling and coordination of care. She'll return in 3 months  MEDICATIONS this encounter: Meds ordered this encounter  Medications  . diphenhydrAMINE (BENADRYL) 25 MG tablet    Sig: Take 25 mg by mouth every 6 (six) hours as needed.  . Multiple Vitamin (MULTIVITAMIN) capsule    Sig: Take 1 capsule by mouth daily.  . prazosin (MINIPRESS) 2 MG capsule    Sig: Take 2 mg by mouth at bedtime.  . risperiDONE (RISPERDAL) 1 MG tablet    Sig: Take 1 tablet (1 mg total) by mouth at bedtime.    Dispense:  30 tablet    Refill:  2  . benztropine (COGENTIN) 0.5 MG tablet    Sig: Take 1 tablet (  0.5 mg total) by mouth 2 (two) times daily.    Dispense:  60 tablet    Refill:  2  . lamoTRIgine (LAMICTAL) 100 MG tablet    Sig: Take 1 tablet (100 mg total) by mouth 2 (two) times daily.    Dispense:  60 tablet    Refill:  2  . buPROPion (WELLBUTRIN XL) 300 MG 24 hr tablet    Sig: Take 1 tablet (300 mg total) by mouth every morning.    Dispense:  30 tablet    Refill:  2  . PARoxetine (PAXIL) 40 MG tablet     Sig: Take 1 tablet (40 mg total) by mouth at bedtime.    Dispense:  30 tablet    Refill:  2  . risperiDONE (RISPERDAL) 0.25 MG tablet    Sig: Take 1 tablet (0.25 mg total) by mouth 3 (three) times daily. Takes at 8:00AM, 2:00PM, 5:00PM.    Dispense:  90 tablet    Refill:  2  . clonazePAM (KLONOPIN) 0.5 MG tablet    Sig: Take daily at 5 pm    Dispense:  30 tablet    Refill:  2  . clonazePAM (KLONOPIN) 0.5 MG tablet    Sig: Take daily at 5 pm    Dispense:  30 tablet    Refill:  2    Medical Decision Making Problem Points:  Established problem, stable/improving (1), Established problem, worsening (2), New problem, with additional work-up planned (4), Review of last therapy session (1) and Review of psycho-social stressors (1) Data Points:  Review of medication regiment & side effects (2) Review of new medications or change in dosage (2)  I certify that outpatient services furnished can reasonably be expected to improve the patient's condition.   Diannia Ruder, MD

## 2015-03-03 ENCOUNTER — Ambulatory Visit (HOSPITAL_COMMUNITY): Payer: Self-pay | Admitting: Psychiatry

## 2015-03-06 DIAGNOSIS — F3130 Bipolar disorder, current episode depressed, mild or moderate severity, unspecified: Secondary | ICD-10-CM | POA: Diagnosis not present

## 2015-03-06 DIAGNOSIS — J159 Unspecified bacterial pneumonia: Secondary | ICD-10-CM | POA: Diagnosis not present

## 2015-03-06 DIAGNOSIS — N183 Chronic kidney disease, stage 3 (moderate): Secondary | ICD-10-CM | POA: Diagnosis not present

## 2015-03-08 DIAGNOSIS — L97229 Non-pressure chronic ulcer of left calf with unspecified severity: Secondary | ICD-10-CM | POA: Diagnosis not present

## 2015-03-12 ENCOUNTER — Emergency Department (HOSPITAL_COMMUNITY)
Admission: EM | Admit: 2015-03-12 | Discharge: 2015-03-12 | Disposition: A | Discharge status: Home / Self Care | Payer: Medicare Other | Attending: Emergency Medicine | Admitting: Emergency Medicine

## 2015-03-12 ENCOUNTER — Encounter (HOSPITAL_COMMUNITY): Payer: Self-pay | Admitting: Emergency Medicine

## 2015-03-12 DIAGNOSIS — F319 Bipolar disorder, unspecified: Secondary | ICD-10-CM | POA: Insufficient documentation

## 2015-03-12 DIAGNOSIS — Z8719 Personal history of other diseases of the digestive system: Secondary | ICD-10-CM | POA: Insufficient documentation

## 2015-03-12 DIAGNOSIS — Z792 Long term (current) use of antibiotics: Secondary | ICD-10-CM | POA: Insufficient documentation

## 2015-03-12 DIAGNOSIS — S81802A Unspecified open wound, left lower leg, initial encounter: Secondary | ICD-10-CM | POA: Diagnosis not present

## 2015-03-12 DIAGNOSIS — M1990 Unspecified osteoarthritis, unspecified site: Secondary | ICD-10-CM | POA: Diagnosis not present

## 2015-03-12 DIAGNOSIS — F419 Anxiety disorder, unspecified: Secondary | ICD-10-CM | POA: Insufficient documentation

## 2015-03-12 DIAGNOSIS — S81812A Laceration without foreign body, left lower leg, initial encounter: Secondary | ICD-10-CM | POA: Diagnosis not present

## 2015-03-12 DIAGNOSIS — Z79899 Other long term (current) drug therapy: Secondary | ICD-10-CM | POA: Insufficient documentation

## 2015-03-12 DIAGNOSIS — E785 Hyperlipidemia, unspecified: Secondary | ICD-10-CM | POA: Insufficient documentation

## 2015-03-12 DIAGNOSIS — Y9289 Other specified places as the place of occurrence of the external cause: Secondary | ICD-10-CM | POA: Insufficient documentation

## 2015-03-12 DIAGNOSIS — Z880 Allergy status to penicillin: Secondary | ICD-10-CM | POA: Insufficient documentation

## 2015-03-12 DIAGNOSIS — W06XXXA Fall from bed, initial encounter: Secondary | ICD-10-CM | POA: Diagnosis not present

## 2015-03-12 DIAGNOSIS — Z7982 Long term (current) use of aspirin: Secondary | ICD-10-CM | POA: Insufficient documentation

## 2015-03-12 DIAGNOSIS — Y9389 Activity, other specified: Secondary | ICD-10-CM | POA: Insufficient documentation

## 2015-03-12 DIAGNOSIS — Z23 Encounter for immunization: Secondary | ICD-10-CM | POA: Insufficient documentation

## 2015-03-12 DIAGNOSIS — K5641 Fecal impaction: Secondary | ICD-10-CM | POA: Diagnosis not present

## 2015-03-12 DIAGNOSIS — Y998 Other external cause status: Secondary | ICD-10-CM | POA: Insufficient documentation

## 2015-03-12 MED ORDER — TETANUS-DIPHTH-ACELL PERTUSSIS 5-2.5-18.5 LF-MCG/0.5 IM SUSP
0.5000 mL | Freq: Once | INTRAMUSCULAR | Status: AC
  Administered 2015-03-12: 0.5 mL via INTRAMUSCULAR
  Filled 2015-03-12: qty 0.5

## 2015-03-12 MED ORDER — LIDOCAINE-EPINEPHRINE (PF) 1 %-1:200000 IJ SOLN
INTRAMUSCULAR | Status: AC
  Administered 2015-03-12: 10 mL
  Filled 2015-03-12: qty 20

## 2015-03-12 NOTE — ED Notes (Signed)
Wound well approximated, stitches placed by Dr Fayrene FearingJames. Wound dressed with sterile non-adherent dressing and secured with ACE wrap.

## 2015-03-12 NOTE — ED Provider Notes (Signed)
CSN: [id number]     Arrival date & time 03/12/15  1209 History   First MD Initiated Contact with Patient 03/12/15 1232     Chief Complaint  Patient presents with  . Fall      HPI  Patient presents for evaluation after a fall at her care facility at about. She was getting out of her bed. Her buttocks that off of the bed and her legs went under the bed adjacent to hers. The anterior aspect of her left lower leg struck part of the middle and has a laceration. No other areas of pain or injury.  Past Medical History  Diagnosis Date  . High blood pressure   . Arthritis   . Depression   . Anxiety   . Hemorrhoids   . Hyperlipidemia   . Bipolar 1 disorder [facility]    Past Surgical History  Procedure Laterality Date  . Back surgery  03/16/87  . Cholecystectomy  08/15/93  . Partial hysterectomy  04/04/95  . Bladder tacked  04/04/95  . Ankle surgery  04/28/97    right ankle fracture  . Wrist surgery  02/24/09    right wrist fracture  . Colonoscopy  10/26/2002    Dr. Karilyn Cota- small external hemorrhoids otherwise normal   . Breast biopsy  05/16/95    right side  . Savory dilation  10/31/2011    Procedure: SAVORY DILATION;  Surgeon: Corbin Ade, MD;  Location: [facility];  Service: Endoscopy;  Laterality: N/A;  Elease Hashimoto dilation  10/31/2011    Procedure: Elease Hashimoto DILATION;  Surgeon: Corbin Ade, MD;  Location: [facility];  Service: Endoscopy;  Laterality: N/A;  . Abdominal hysterectomy     Family History  Problem Relation Age of Onset  . Arthritis    . Asthma    . Diabetes    . Colon cancer Neg Hx   . Anxiety disorder Paternal Aunt   . Anxiety disorder Paternal Uncle   . Bipolar disorder Cousin    Social History  Substance Use Topics  . Smoking status: Never Smoker   . Smokeless tobacco: Never Used  . Alcohol Use: No   OB History    Gravida Para Term Preterm AB TAB SAB Ectopic Multiple Living            0     Review of Systems  Constitutional: Negative for fever,  chills, diaphoresis, appetite change and fatigue.  HENT: Negative for mouth sores, sore throat and trouble swallowing.   Eyes: Negative for visual disturbance.  Respiratory: Negative for cough, chest tightness, shortness of breath and wheezing.   Cardiovascular: Negative for chest pain.  Gastrointestinal: Negative for nausea, vomiting, abdominal pain, diarrhea and abdominal distention.  Endocrine: Negative for polydipsia, polyphagia and polyuria.  Genitourinary: Negative for dysuria, frequency and hematuria.  Musculoskeletal: Negative for gait problem.  Skin: Negative for color change, pallor and rash.  Neurological: Negative for dizziness, syncope, light-headedness and headaches.  Hematological: Does not bruise/bleed easily.  Psychiatric/Behavioral: Negative for behavioral problems and confusion.      Allergies  Benzodiazepines; Ciprofloxacin; Cephalexin; and Penicillins  Home Medications   Prior to Admission medications   Medication Sig Start Date End Date Taking? Authorizing Provider  acetaminophen (TYLENOL) 500 MG tablet Take 1,000 mg by mouth every 6 (six) hours as needed for pain.    Historical Provider, MD  aspirin EC 81 MG tablet Take 81 mg by mouth every morning.     Historical Provider, MD  benztropine (  COGENTIN) 0.5 MG tablet Take 1 tablet (0.5 mg total) by mouth 2 (two) times daily. 03/01/15 02/29/16  Myrlene Broker, MD  beta carotene w/minerals (OCUVITE) tablet Take 1 tablet by mouth every morning.     Historical Provider, MD  buPROPion (WELLBUTRIN XL) 300 MG 24 hr tablet Take 1 tablet (300 mg total) by mouth every morning. 03/01/15 02/29/16  Myrlene Broker, MD  Calcium Carb-Vit D-C-E-Mineral (OS-CAL ULTRA) 600 MG TABS Take by mouth 2 (two) times daily.    Historical Provider, MD  calcium carbonate (OS-CAL) 600 MG TABS Take 1,200 mg by mouth daily with breakfast.     Historical Provider, MD  Cholecalciferol (VITAMIN D) 2000 UNITS CAPS Take 1 capsule by mouth every morning.      Historical Provider, MD  clonazePAM Scarlette Calico) 0.5 MG tablet Take daily at 5 pm 03/01/15   Myrlene Broker, MD  clonazePAM Scarlette Calico) 0.5 MG tablet Take daily at 5 pm 03/01/15   Myrlene Broker, MD  diphenhydrAMINE (BENADRYL) 25 MG tablet Take 25 mg by mouth every 6 (six) hours as needed.    Historical Provider, MD  docusate sodium (STOOL SOFTENER) 100 MG capsule Take 200 mg by mouth at bedtime.    Historical Provider, MD  furosemide (LASIX) 20 MG tablet Take 20 mg by mouth every Monday, Wednesday, and Friday.  01/26/14   Historical Provider, MD  lamoTRIgine (LAMICTAL) 100 MG tablet Take 1 tablet (100 mg total) by mouth 2 (two) times daily. 03/01/15   Myrlene Broker, MD  loratadine (CLARITIN) 10 MG tablet Take 10 mg by mouth daily as needed for allergies.    Historical Provider, MD  losartan (COZAAR) 100 MG tablet Take 100 mg by mouth every morning.  10/04/11   Historical Provider, MD  Multiple Vitamin (MULTIVITAMIN) capsule Take 1 capsule by mouth daily.    Historical Provider, MD  multivitamin (PROSIGHT) TABS tablet Take 1 tablet by mouth daily.    Historical Provider, MD  mupirocin ointment (BACTROBAN) 2 % Place 1 application into the nose 3 (three) times daily as needed (wound care).    Historical Provider, MD  nitrofurantoin, macrocrystal-monohydrate, (MACROBID) 100 MG capsule Take 100 mg by mouth 2 (two) times daily.    Historical Provider, MD  omeprazole (PRILOSEC) 40 MG capsule Take 40 mg by mouth daily.    Historical Provider, MD  oxybutynin (DITROPAN-XL) 10 MG 24 hr tablet Take 10 mg by mouth at bedtime.    Historical Provider, MD  PARoxetine (PAXIL) 40 MG tablet Take 1 tablet (40 mg total) by mouth at bedtime. 03/01/15 02/29/16  Myrlene Broker, MD  polyethylene glycol [facility] Ethelene Hal) packet Take 17 g by mouth daily as needed for mild constipation.    Historical Provider, MD  prazosin (MINIPRESS) 2 MG capsule Take 2 mg by mouth at bedtime.    Historical Provider, MD  risperiDONE  (RISPERDAL) 0.25 MG tablet Take 1 tablet (0.25 mg total) by mouth 3 (three) times daily. Takes at 8:00AM, 2:00PM, 5:00PM. 03/01/15   Myrlene Broker, MD  risperiDONE (RISPERDAL) 1 MG tablet Take 1 tablet (1 mg total) by mouth at bedtime. 03/01/15   Myrlene Broker, MD  solifenacin (VESICARE) 10 MG tablet Take 10 mg by mouth every morning.     Historical Provider, MD  terazosin (HYTRIN) 2 MG capsule Take 2 mg by mouth at bedtime.     Historical Provider, MD   BP 143/104 mmHg  Pulse 94  Temp(Src) 98 F (36.7 C) (  Oral)  Resp 20  Ht 5\' 6"  (1.676 m)  Wt 253 lb (114.76 kg)  BMI 40.85 kg/m2  SpO2 96% Physical Exam  Constitutional: She is oriented to person, place, and time. She appears well-developed and well-nourished. No distress.  HENT:  Head: Normocephalic.  Eyes: Conjunctivae are normal. Pupils are equal, round, and reactive to light. No scleral icterus.  Neck: Normal range of motion. Neck supple. No thyromegaly present.  Cardiovascular: Normal rate and regular rhythm.  Exam reveals no gallop and no friction rub.   No murmur heard. Pulmonary/Chest: Effort normal and breath sounds normal. No respiratory distress. She has no wheezes. She has no rales.  Abdominal: Soft. Bowel sounds are normal. She exhibits no distension. There is no tenderness. There is no rebound.  Musculoskeletal: Normal range of motion.  Neurological: She is alert and oriented to person, place, and time.  Skin: Skin is warm and dry. No rash noted.     Psychiatric: She has a normal mood and affect. Her behavior is normal.    ED Course  Procedures (including critical care time) Labs Review Labs Reviewed - No data to display  Imaging Review No results found. I have personally reviewed and evaluated these images and lab results as part of my medical decision-making.   EKG Interpretation None      MDM   Final diagnoses:  Leg laceration, left, initial encounter    LACERATION REPAIR Performed by: Claudean KindsJAMES,  Dinesh Ulysse JOSEPH Authorized by: Claudean KindsJAMES, Waino Mounsey JOSEPH Consent: Verbal consent obtained. Risks and benefits: risks, benefits and alternatives were discussed Consent given by: patient Patient identity confirmed: provided demographic data Prepped and Draped in normal sterile fashion Wound explored  Laceration Location: Left shin  Laceration Length: cm  No Foreign Bodies seen or palpated  Anesthesia: local infiltration  Local anesthetic: lidocaine 1% c epinephrine  Anesthetic total: 4 ml  Irrigation method: syringe Amount of cleaning: standard  Skin closure: 3-0 and 4-0 Nylon.    Number of sutures: 30  Technique: Simple, running-locking, and retention (2, Horizontal mattress)  Patient tolerance: Patient tolerated the procedure well with no immediate complications.     Rolland PorterMark Jhoselin Crume, MD 03/12/15 [phone]

## 2015-03-12 NOTE — ED Notes (Signed)
EMS here for pt

## 2015-03-12 NOTE — ED Notes (Signed)
Awaiting EMS for transport back to facility

## 2015-03-12 NOTE — Discharge Instructions (Signed)
Suture removal in 10 days. Keep covered with antibiotic gauze, and Ace wrap. Wash once per day with soap and water and redress.   Laceration Care, Adult A laceration is a cut that goes through all layers of the skin. The cut also goes into the tissue that is right under the skin. Some cuts heal on their own. Others need to be closed with stitches (sutures), staples, skin adhesive strips, or wound glue. Taking care of your cut lowers your risk of infection and helps your cut to heal better. HOW TO TAKE CARE OF YOUR CUT For stitches or staples:  Keep the wound clean and dry.  If you were given a bandage (dressing), you should change it at least one time per day or as told by your doctor. You should also change it if it gets wet or dirty.  Keep the wound completely dry for the first 24 hours or as told by your doctor. After that time, you may take a shower or a bath. However, make sure that the wound is not soaked in water until after the stitches or staples have been removed.  Clean the wound one time each day or as told by your doctor:  Wash the wound with soap and water.  Rinse the wound with water until all of the soap comes off.  Pat the wound dry with a clean towel. Do not rub the wound.  After you clean the wound, put a thin layer of antibiotic ointment on it as told by your doctor. This ointment:  Helps to prevent infection.  Keeps the bandage from sticking to the wound.  Have your stitches or staples removed as told by your doctor. If your doctor used skin adhesive strips:   Keep the wound clean and dry.  If you were given a bandage, you should change it at least one time per day or as told by your doctor. You should also change it if it gets dirty or wet.  Do not get the skin adhesive strips wet. You can take a shower or a bath, but be careful to keep the wound dry.  If the wound gets wet, pat it dry with a clean towel. Do not rub the wound.  Skin adhesive strips fall  off on their own. You can trim the strips as the wound heals. Do not remove any strips that are still stuck to the wound. They will fall off after a while. If your doctor used wound glue:  Try to keep your wound dry, but you may briefly wet it in the shower or bath. Do not soak the wound in water, such as by swimming.  After you take a shower or a bath, gently pat the wound dry with a clean towel. Do not rub the wound.  Do not do any activities that will make you really sweaty until the skin glue has fallen off on its own.  Do not apply liquid, cream, or ointment medicine to your wound while the skin glue is still on.  If you were given a bandage, you should change it at least one time per day or as told by your doctor. You should also change it if it gets dirty or wet.  If a bandage is placed over the wound, do not let the tape for the bandage touch the skin glue.  Do not pick at the glue. The skin glue usually stays on for 5-10 days. Then, it falls off of the skin. General Instructions  To help prevent scarring, make sure to cover your wound with sunscreen whenever you are outside after stitches are removed, after adhesive strips are removed, or when wound glue stays in place and the wound is healed. Make sure to wear a sunscreen of at least 30 SPF.  Take over-the-counter and prescription medicines only as told by your doctor.  If you were given antibiotic medicine or ointment, take or apply it as told by your doctor. Do not stop using the antibiotic even if your wound is getting better.  Do not scratch or pick at the wound.  Keep all follow-up visits as told by your doctor. This is important.  Check your wound every day for signs of infection. Watch for:  Redness, swelling, or pain.  Fluid, blood, or pus.  Raise (elevate) the injured area above the level of your heart while you are sitting or lying down, if possible. GET HELP IF:  You got a tetanus shot and you have any of  these problems at the injection site:  Swelling.  Very bad pain.  Redness.  Bleeding.  You have a fever.  A wound that was closed breaks open.  You notice a bad smell coming from your wound or your bandage.  You notice something coming out of the wound, such as wood or glass.  Medicine does not help your pain.  You have more redness, swelling, or pain at the site of your wound.  You have fluid, blood, or pus coming from your wound.  You notice a change in the color of your skin near your wound.  You need to change the bandage often because fluid, blood, or pus is coming from the wound.  You start to have a new rash.  You start to have numbness around the wound. GET HELP RIGHT AWAY IF:  You have very bad swelling around the wound.  Your pain suddenly gets worse and is very bad.  You notice painful lumps near the wound or on skin that is anywhere on your body.  You have a red streak going away from your wound.  The wound is on your hand or foot and you cannot move a finger or toe like you usually can.  The wound is on your hand or foot and you notice that your fingers or toes look pale or bluish.   This information is not intended to replace advice given to you by your health care provider. Make sure you discuss any questions you have with your health care provider.   Document Released: 10/16/2007 Document Revised: 09/13/2014 Document Reviewed: 04/25/2014 Elsevier Interactive Patient Education [employer]2016 [employer]

## 2015-03-12 NOTE — ED Notes (Signed)
Patient with slip and fall PTA at [facility]. Patient states slipping to floor while trying to stand from bed. Patient with laceration to left shin area. Wound Dressed by EMS PTA. Bleeding controlled. NO LOC, no head injury per facility. Alert/oriented x 4.

## 2015-03-13 DIAGNOSIS — E46 Unspecified protein-calorie malnutrition: Secondary | ICD-10-CM | POA: Diagnosis not present

## 2015-03-13 DIAGNOSIS — L89603 Pressure ulcer of unspecified heel, stage 3: Secondary | ICD-10-CM | POA: Diagnosis not present

## 2015-03-13 DIAGNOSIS — T148 Other injury of unspecified body region: Secondary | ICD-10-CM | POA: Diagnosis not present

## 2015-03-14 DIAGNOSIS — L89603 Pressure ulcer of unspecified heel, stage 3: Secondary | ICD-10-CM | POA: Diagnosis not present

## 2015-03-14 DIAGNOSIS — D649 Anemia, unspecified: Secondary | ICD-10-CM | POA: Diagnosis not present

## 2015-03-14 DIAGNOSIS — T148 Other injury of unspecified body region: Secondary | ICD-10-CM | POA: Diagnosis not present

## 2015-03-14 DIAGNOSIS — F3130 Bipolar disorder, current episode depressed, mild or moderate severity, unspecified: Secondary | ICD-10-CM | POA: Diagnosis not present

## 2015-03-15 DIAGNOSIS — L89623 Pressure ulcer of left heel, stage 3: Secondary | ICD-10-CM | POA: Diagnosis not present

## 2015-03-22 DIAGNOSIS — L89623 Pressure ulcer of left heel, stage 3: Secondary | ICD-10-CM | POA: Diagnosis not present

## 2015-03-23 DIAGNOSIS — T148 Other injury of unspecified body region: Secondary | ICD-10-CM | POA: Diagnosis not present

## 2015-03-24 ENCOUNTER — Ambulatory Visit (HOSPITAL_COMMUNITY): Admission: RE | Admit: 2015-03-24 | Payer: Medicare Other | Source: Ambulatory Visit

## 2015-03-24 ENCOUNTER — Encounter (HOSPITAL_COMMUNITY): Payer: Self-pay | Admitting: *Deleted

## 2015-03-24 ENCOUNTER — Other Ambulatory Visit (HOSPITAL_COMMUNITY): Payer: Self-pay | Admitting: Internal Medicine

## 2015-03-24 ENCOUNTER — Emergency Department (HOSPITAL_COMMUNITY): Payer: Medicare Other

## 2015-03-24 ENCOUNTER — Inpatient Hospital Stay (HOSPITAL_COMMUNITY)
Admission: EM | Admit: 2015-03-24 | Discharge: 2015-03-27 | DRG: 299 | Disposition: A | Discharge status: Skilled Nursing Facility | Payer: Medicare Other | Attending: Internal Medicine | Admitting: Internal Medicine

## 2015-03-24 DIAGNOSIS — M7989 Other specified soft tissue disorders: Secondary | ICD-10-CM | POA: Diagnosis not present

## 2015-03-24 DIAGNOSIS — F319 Bipolar disorder, unspecified: Secondary | ICD-10-CM | POA: Diagnosis not present

## 2015-03-24 DIAGNOSIS — I82402 Acute embolism and thrombosis of unspecified deep veins of left lower extremity: Secondary | ICD-10-CM | POA: Diagnosis not present

## 2015-03-24 DIAGNOSIS — R1311 Dysphagia, oral phase: Secondary | ICD-10-CM | POA: Diagnosis not present

## 2015-03-24 DIAGNOSIS — R52 Pain, unspecified: Secondary | ICD-10-CM

## 2015-03-24 DIAGNOSIS — Z9981 Dependence on supplemental oxygen: Secondary | ICD-10-CM

## 2015-03-24 DIAGNOSIS — T8130XD Disruption of wound, unspecified, subsequent encounter: Secondary | ICD-10-CM | POA: Diagnosis not present

## 2015-03-24 DIAGNOSIS — F0280 Dementia in other diseases classified elsewhere without behavioral disturbance: Secondary | ICD-10-CM | POA: Diagnosis not present

## 2015-03-24 DIAGNOSIS — L02416 Cutaneous abscess of left lower limb: Secondary | ICD-10-CM | POA: Diagnosis not present

## 2015-03-24 DIAGNOSIS — Z833 Family history of diabetes mellitus: Secondary | ICD-10-CM | POA: Diagnosis not present

## 2015-03-24 DIAGNOSIS — E785 Hyperlipidemia, unspecified: Secondary | ICD-10-CM | POA: Diagnosis present

## 2015-03-24 DIAGNOSIS — K219 Gastro-esophageal reflux disease without esophagitis: Secondary | ICD-10-CM | POA: Diagnosis not present

## 2015-03-24 DIAGNOSIS — F0391 Unspecified dementia with behavioral disturbance: Secondary | ICD-10-CM | POA: Diagnosis not present

## 2015-03-24 DIAGNOSIS — L89613 Pressure ulcer of right heel, stage 3: Secondary | ICD-10-CM | POA: Diagnosis not present

## 2015-03-24 DIAGNOSIS — Z6841 Body Mass Index (BMI) 40.0 and over, adult: Secondary | ICD-10-CM | POA: Diagnosis not present

## 2015-03-24 DIAGNOSIS — F419 Anxiety disorder, unspecified: Secondary | ICD-10-CM | POA: Diagnosis not present

## 2015-03-24 DIAGNOSIS — N183 Chronic kidney disease, stage 3 unspecified: Secondary | ICD-10-CM | POA: Diagnosis present

## 2015-03-24 DIAGNOSIS — F339 Major depressive disorder, recurrent, unspecified: Secondary | ICD-10-CM | POA: Diagnosis not present

## 2015-03-24 DIAGNOSIS — L03119 Cellulitis of unspecified part of limb: Secondary | ICD-10-CM

## 2015-03-24 DIAGNOSIS — L03116 Cellulitis of left lower limb: Secondary | ICD-10-CM | POA: Diagnosis not present

## 2015-03-24 DIAGNOSIS — R4182 Altered mental status, unspecified: Secondary | ICD-10-CM | POA: Diagnosis not present

## 2015-03-24 DIAGNOSIS — R262 Difficulty in walking, not elsewhere classified: Secondary | ICD-10-CM | POA: Diagnosis not present

## 2015-03-24 DIAGNOSIS — Z7401 Bed confinement status: Secondary | ICD-10-CM | POA: Diagnosis not present

## 2015-03-24 DIAGNOSIS — M79605 Pain in left leg: Principal | ICD-10-CM

## 2015-03-24 DIAGNOSIS — M79604 Pain in right leg: Secondary | ICD-10-CM

## 2015-03-24 DIAGNOSIS — I82409 Acute embolism and thrombosis of unspecified deep veins of unspecified lower extremity: Secondary | ICD-10-CM | POA: Diagnosis not present

## 2015-03-24 DIAGNOSIS — I129 Hypertensive chronic kidney disease with stage 1 through stage 4 chronic kidney disease, or unspecified chronic kidney disease: Secondary | ICD-10-CM | POA: Diagnosis present

## 2015-03-24 DIAGNOSIS — L89623 Pressure ulcer of left heel, stage 3: Secondary | ICD-10-CM | POA: Diagnosis not present

## 2015-03-24 DIAGNOSIS — L89603 Pressure ulcer of unspecified heel, stage 3: Secondary | ICD-10-CM | POA: Diagnosis not present

## 2015-03-24 DIAGNOSIS — R600 Localized edema: Secondary | ICD-10-CM | POA: Diagnosis not present

## 2015-03-24 DIAGNOSIS — R5381 Other malaise: Secondary | ICD-10-CM | POA: Diagnosis not present

## 2015-03-24 DIAGNOSIS — Z825 Family history of asthma and other chronic lower respiratory diseases: Secondary | ICD-10-CM | POA: Diagnosis not present

## 2015-03-24 DIAGNOSIS — N3289 Other specified disorders of bladder: Secondary | ICD-10-CM | POA: Diagnosis not present

## 2015-03-24 DIAGNOSIS — E669 Obesity, unspecified: Secondary | ICD-10-CM | POA: Diagnosis present

## 2015-03-24 DIAGNOSIS — L97409 Non-pressure chronic ulcer of unspecified heel and midfoot with unspecified severity: Secondary | ICD-10-CM | POA: Diagnosis present

## 2015-03-24 DIAGNOSIS — I82411 Acute embolism and thrombosis of right femoral vein: Secondary | ICD-10-CM | POA: Diagnosis not present

## 2015-03-24 DIAGNOSIS — L0390 Cellulitis, unspecified: Secondary | ICD-10-CM | POA: Diagnosis not present

## 2015-03-24 DIAGNOSIS — I8290 Acute embolism and thrombosis of unspecified vein: Secondary | ICD-10-CM | POA: Diagnosis not present

## 2015-03-24 DIAGNOSIS — M1990 Unspecified osteoarthritis, unspecified site: Secondary | ICD-10-CM | POA: Diagnosis present

## 2015-03-24 DIAGNOSIS — F0390 Unspecified dementia without behavioral disturbance: Secondary | ICD-10-CM | POA: Diagnosis not present

## 2015-03-24 DIAGNOSIS — T148 Other injury of unspecified body region: Secondary | ICD-10-CM | POA: Diagnosis not present

## 2015-03-24 DIAGNOSIS — T814XXD Infection following a procedure, subsequent encounter: Secondary | ICD-10-CM

## 2015-03-24 DIAGNOSIS — E860 Dehydration: Secondary | ICD-10-CM | POA: Diagnosis not present

## 2015-03-24 DIAGNOSIS — K648 Other hemorrhoids: Secondary | ICD-10-CM | POA: Diagnosis not present

## 2015-03-24 DIAGNOSIS — M6281 Muscle weakness (generalized): Secondary | ICD-10-CM | POA: Diagnosis not present

## 2015-03-24 DIAGNOSIS — IMO0001 Reserved for inherently not codable concepts without codable children: Secondary | ICD-10-CM

## 2015-03-24 DIAGNOSIS — L02419 Cutaneous abscess of limb, unspecified: Secondary | ICD-10-CM | POA: Diagnosis not present

## 2015-03-24 DIAGNOSIS — R2681 Unsteadiness on feet: Secondary | ICD-10-CM | POA: Diagnosis not present

## 2015-03-24 DIAGNOSIS — I10 Essential (primary) hypertension: Secondary | ICD-10-CM | POA: Diagnosis not present

## 2015-03-24 DIAGNOSIS — R41841 Cognitive communication deficit: Secondary | ICD-10-CM | POA: Diagnosis not present

## 2015-03-24 DIAGNOSIS — R279 Unspecified lack of coordination: Secondary | ICD-10-CM | POA: Diagnosis not present

## 2015-03-24 DIAGNOSIS — M79661 Pain in right lower leg: Secondary | ICD-10-CM | POA: Diagnosis not present

## 2015-03-24 HISTORY — DX: Altered mental status, unspecified: R41.82

## 2015-03-24 HISTORY — DX: Hypoglycemia, unspecified: E16.2

## 2015-03-24 HISTORY — DX: Localized edema: R60.0

## 2015-03-24 HISTORY — DX: Dysphagia, unspecified: R13.10

## 2015-03-24 HISTORY — DX: Gastro-esophageal reflux disease without esophagitis: K21.9

## 2015-03-24 HISTORY — DX: Weakness: R53.1

## 2015-03-24 HISTORY — DX: Unspecified dementia, unspecified severity, without behavioral disturbance, psychotic disturbance, mood disturbance, and anxiety: F03.90

## 2015-03-24 HISTORY — DX: Unspecified osteoarthritis, unspecified site: M19.90

## 2015-03-24 HISTORY — DX: Thrombocytopenia, unspecified: D69.6

## 2015-03-24 HISTORY — DX: Chronic kidney disease, unspecified: N18.9

## 2015-03-24 LAB — APTT: aPTT: 30 seconds (ref 24–37)

## 2015-03-24 LAB — BASIC METABOLIC PANEL
Anion gap: 7 (ref 5–15)
BUN: 25 mg/dL — AB (ref 6–20)
CALCIUM: 9.7 mg/dL (ref 8.9–10.3)
CO2: 27 mmol/L (ref 22–32)
CREATININE: 1.33 mg/dL — AB (ref 0.44–1.00)
Chloride: 104 mmol/L (ref 101–111)
GFR calc Af Amer: 44 mL/min — ABNORMAL LOW (ref >60)
GFR, EST NON AFRICAN AMERICAN: 38 mL/min — AB (ref >60)
GLUCOSE: 105 mg/dL — AB (ref 65–99)
Potassium: 4.2 mmol/L (ref 3.5–5.1)
Sodium: 138 mmol/L (ref 135–145)

## 2015-03-24 LAB — HEPARIN LEVEL (UNFRACTIONATED): HEPARIN UNFRACTIONATED: 0.69 [IU]/mL (ref 0.30–0.70)

## 2015-03-24 LAB — CBC WITH DIFFERENTIAL/PLATELET
Basophils Absolute: 0 10*3/uL (ref 0.0–0.1)
Basophils Relative: 0 %
EOS ABS: 0.2 10*3/uL (ref 0.0–0.7)
EOS PCT: 3 %
HCT: 36.8 % (ref 36.0–46.0)
Hemoglobin: 11.5 g/dL — ABNORMAL LOW (ref 12.0–15.0)
LYMPHS ABS: 0.8 10*3/uL (ref 0.7–4.0)
LYMPHS PCT: 15 %
MCH: 30.7 pg (ref 26.0–34.0)
MCHC: 31.3 g/dL (ref 30.0–36.0)
MCV: 98.1 fL (ref 78.0–100.0)
Monocytes Absolute: 0.4 10*3/uL (ref 0.1–1.0)
Monocytes Relative: 8 %
NEUTROS PCT: 74 %
Neutro Abs: 4 10*3/uL (ref 1.7–7.7)
Platelets: 273 10*3/uL (ref 150–400)
RBC: 3.75 MIL/uL — AB (ref 3.87–5.11)
RDW: 14.2 % (ref 11.5–15.5)
WBC: 5.4 10*3/uL (ref 4.0–10.5)

## 2015-03-24 LAB — PROTIME-INR
INR: 1.14 (ref 0.00–1.49)
PROTHROMBIN TIME: 14.8 s (ref 11.6–15.2)

## 2015-03-24 MED ORDER — BUPROPION HCL ER (XL) 300 MG PO TB24
300.0000 mg | ORAL_TABLET | ORAL | Status: DC
  Administered 2015-03-25 – 2015-03-27 (×3): 300 mg via ORAL
  Filled 2015-03-24 (×5): qty 1

## 2015-03-24 MED ORDER — ONDANSETRON HCL 4 MG PO TABS: 4.0000 mg | ORAL_TABLET | Freq: Three times a day (TID) | ORAL | Status: DC | PRN

## 2015-03-24 MED ORDER — HEPARIN (PORCINE) IN NACL 100-0.45 UNIT/ML-% IJ SOLN
1050.0000 [IU]/h | INTRAMUSCULAR | Status: DC
  Administered 2015-03-24 – 2015-03-25 (×2): 1350 [IU]/h via INTRAVENOUS
  Administered 2015-03-26 – 2015-03-27 (×2): 1050 [IU]/h via INTRAVENOUS
  Filled 2015-03-24 (×4): qty 250

## 2015-03-24 MED ORDER — LAMOTRIGINE 100 MG PO TABS
100.0000 mg | ORAL_TABLET | Freq: Two times a day (BID) | ORAL | Status: DC
  Administered 2015-03-24 – 2015-03-27 (×6): 100 mg via ORAL
  Filled 2015-03-24 (×6): qty 1

## 2015-03-24 MED ORDER — PANTOPRAZOLE SODIUM 40 MG PO TBEC
40.0000 mg | DELAYED_RELEASE_TABLET | Freq: Every day | ORAL | Status: DC
  Administered 2015-03-24 – 2015-03-27 (×4): 40 mg via ORAL
  Filled 2015-03-24 (×4): qty 1

## 2015-03-24 MED ORDER — OXYBUTYNIN CHLORIDE ER 5 MG PO TB24
10.0000 mg | ORAL_TABLET | Freq: Every day | ORAL | Status: DC
  Administered 2015-03-24 – 2015-03-27 (×4): 10 mg via ORAL
  Filled 2015-03-24 (×4): qty 2

## 2015-03-24 MED ORDER — SODIUM CHLORIDE 0.9 % IV SOLN
INTRAVENOUS | Status: DC
  Administered 2015-03-24: 1000 mL via INTRAVENOUS
  Administered 2015-03-26: 1 mL via INTRAVENOUS
  Administered 2015-03-27: 05:00:00 via INTRAVENOUS

## 2015-03-24 MED ORDER — ALUM & MAG HYDROXIDE-SIMETH 200-200-20 MG/5ML PO SUSP: 30.0000 mL | Freq: Four times a day (QID) | ORAL | Status: DC | PRN

## 2015-03-24 MED ORDER — VANCOMYCIN HCL IN DEXTROSE 1-5 GM/200ML-% IV SOLN
1000.0000 mg | Freq: Once | INTRAVENOUS | Status: AC
  Administered 2015-03-24: 1000 mg via INTRAVENOUS
  Filled 2015-03-24: qty 200

## 2015-03-24 MED ORDER — CLINDAMYCIN PHOSPHATE 600 MG/50ML IV SOLN
600.0000 mg | Freq: Three times a day (TID) | INTRAVENOUS | Status: DC
  Administered 2015-03-24 – 2015-03-27 (×9): 600 mg via INTRAVENOUS
  Filled 2015-03-24 (×13): qty 50

## 2015-03-24 MED ORDER — CLONAZEPAM 0.5 MG PO TABS: 0.5000 mg | ORAL_TABLET | Freq: Two times a day (BID) | ORAL | Status: DC | PRN

## 2015-03-24 MED ORDER — RESOURCE 2.0 PO LIQD: 120.0000 mL | Freq: Two times a day (BID) | ORAL | Status: DC

## 2015-03-24 MED ORDER — RISPERIDONE 0.5 MG PO TABS
0.2500 mg | ORAL_TABLET | Freq: Three times a day (TID) | ORAL | Status: DC
  Administered 2015-03-24 – 2015-03-27 (×8): 0.25 mg via ORAL
  Filled 2015-03-24 (×8): qty 1

## 2015-03-24 MED ORDER — SODIUM CHLORIDE 0.9 % IV SOLN
INTRAVENOUS | Status: DC
  Administered 2015-03-24: 10 mL/h via INTRAVENOUS

## 2015-03-24 MED ORDER — BENZTROPINE MESYLATE 1 MG PO TABS
0.5000 mg | ORAL_TABLET | Freq: Two times a day (BID) | ORAL | Status: DC
  Administered 2015-03-24 – 2015-03-27 (×6): 0.5 mg via ORAL
  Filled 2015-03-24 (×6): qty 1

## 2015-03-24 MED ORDER — LORATADINE 10 MG PO TABS: 10.0000 mg | ORAL_TABLET | Freq: Two times a day (BID) | ORAL | Status: DC | PRN

## 2015-03-24 MED ORDER — RISPERIDONE 1 MG PO TABS
1.0000 mg | ORAL_TABLET | Freq: Every day | ORAL | Status: DC
  Administered 2015-03-24 – 2015-03-26 (×3): 1 mg via ORAL
  Filled 2015-03-24 (×3): qty 1

## 2015-03-24 MED ORDER — HEPARIN BOLUS VIA INFUSION
4000.0000 [IU] | Freq: Once | INTRAVENOUS | Status: AC
  Administered 2015-03-24: 4000 [IU] via INTRAVENOUS

## 2015-03-24 MED ORDER — PAROXETINE HCL 20 MG PO TABS
40.0000 mg | ORAL_TABLET | Freq: Every day | ORAL | Status: DC
  Administered 2015-03-24 – 2015-03-26 (×3): 40 mg via ORAL
  Filled 2015-03-24 (×3): qty 2

## 2015-03-24 MED ORDER — ACETAMINOPHEN 325 MG PO TABS: 650.0000 mg | ORAL_TABLET | Freq: Four times a day (QID) | ORAL | Status: DC | PRN

## 2015-03-24 MED ORDER — FUROSEMIDE 20 MG PO TABS
20.0000 mg | ORAL_TABLET | ORAL | Status: DC
  Administered 2015-03-24 – 2015-03-27 (×2): 20 mg via ORAL
  Filled 2015-03-24 (×2): qty 1

## 2015-03-24 MED ORDER — ASPIRIN EC 81 MG PO TBEC
81.0000 mg | DELAYED_RELEASE_TABLET | ORAL | Status: DC
Start: 2015-03-25 — End: 2015-03-27
  Administered 2015-03-25 – 2015-03-27 (×3): 81 mg via ORAL
  Filled 2015-03-24 (×3): qty 1

## 2015-03-24 MED ORDER — BISACODYL 10 MG RE SUPP: 10.0000 mg | Freq: Three times a day (TID) | RECTAL | Status: DC | PRN

## 2015-03-24 MED ORDER — POLYETHYLENE GLYCOL 3350 17 G PO PACK
17.0000 g | PACK | Freq: Every day | ORAL | Status: DC
  Administered 2015-03-24 – 2015-03-27 (×4): 17 g via ORAL
  Filled 2015-03-24 (×4): qty 1

## 2015-03-24 MED ORDER — ACETAMINOPHEN 650 MG RE SUPP: 650.0000 mg | Freq: Four times a day (QID) | RECTAL | Status: DC | PRN

## 2015-03-24 MED ORDER — TERAZOSIN HCL 1 MG PO CAPS
2.0000 mg | ORAL_CAPSULE | Freq: Every day | ORAL | Status: DC
  Administered 2015-03-24 – 2015-03-26 (×3): 2 mg via ORAL
  Filled 2015-03-24 (×3): qty 2

## 2015-03-24 MED ORDER — BOOST / RESOURCE BREEZE PO LIQD
1.0000 | Freq: Two times a day (BID) | ORAL | Status: DC
  Administered 2015-03-25 – 2015-03-27 (×6): 1 via ORAL

## 2015-03-24 MED ORDER — SENNA 8.6 MG PO TABS
1.0000 | ORAL_TABLET | Freq: Two times a day (BID) | ORAL | Status: DC
  Administered 2015-03-24 – 2015-03-27 (×6): 8.6 mg via ORAL
  Filled 2015-03-24 (×6): qty 1

## 2015-03-24 NOTE — ED Notes (Signed)
Hospitalist at bedside 

## 2015-03-24 NOTE — ED Notes (Addendum)
Pt sent over by EMS From [facility] to evaluate for possible infection of LLE wound and possible DVT to BLE. Pt has sutures to LLE wound. Sister at bedside who is POA reports that she had ultrasound done yesterday at [facility] and was told that pt had DVT to right upper leg and pt was scheduled for arterial ultrasound today to evaluate further. Pt also reports that pt fell getting out of bed on 03/12/15 and her left lower leg slid under the bed which caused the LLE laceration and pt was seen in ED that day and had it sutured by Dr. Fayrene FearingJames.

## 2015-03-24 NOTE — Progress Notes (Addendum)
ANTICOAGULATION CONSULT NOTE - Initial Consult  Pharmacy Consult for heparin Indication: DVT  Allergies  Allergen Reactions  . Benzodiazepines Other (See Comments)    Memory problems on Ativan, which goes away when she is off of it.   . Ciprofloxacin     Rash at site of injection with itching  . Cephalexin Rash and Other (See Comments)    Blistering of the mouth  . Penicillins Rash    Patient Measurements: Height: 5\' 6"  (167.6 cm) Weight: 253 lb (114.76 kg) IBW/kg (Calculated) : 59.3 Heparin Dosing Weight: 86.3 kg  Vital Signs: Temp: 99.4 F (37.4 C) (11/11 1124) Temp Source: Rectal (11/11 1124) BP: 121/70 mmHg (11/11 1400) Pulse Rate: 74 (11/11 1300)  Labs:  Recent Labs  03/24/15 1229  HGB 11.5*  HCT 36.8  PLT 273  APTT 30  LABPROT 14.8  INR 1.14  CREATININE 1.33*    Estimated Creatinine Clearance: 47 mL/min (by C-G formula based on Cr of 1.33).   Medical History: Past Medical History  Diagnosis Date  . High blood pressure   . Arthritis   . Depression   . Anxiety   . Hemorrhoids   . Hyperlipidemia   . Bipolar 1 disorder (HCC)   . Dysphagia   . Altered mental status   . Localized edema   . Weakness   . GERD (gastroesophageal reflux disease)   . Chronic kidney disease   . Dementia   . Hypoglycemia   . Thrombocytopenia (HCC)   . Osteoarthritis     Medications:  See medication history  Assessment: 74 yo lady to start heparin for DVT.  She was not on anticoagulants prior to admission. Goal of Therapy:  Heparin level 0.3-0.7 units/ml Monitor platelets by anticoagulation protocol: Yes   Plan:  Heparin bolus 4000 units and drip at 1350 units/hr Check heparin level 6 hours after start and daily while on heparin Daily CBC Monitor for bleeding complications and change to oral anticoagulation.  Thanks for allowing pharmacy to be a part of this patient's care.  Talbert CageLora Rhylei Mcquaig, PharmD Clinical Pharmacist  03/24/2015,2:41 PM  Addum:  Initial  heparin level in goal range.  Cont drip at current rate and f/u am labs.

## 2015-03-24 NOTE — H&P (Signed)
History and Physical  Amy Clay ZOX:[id number] DOB: 11/07/1940 DOA: 03/24/2015  Referring physician: Dr Freida Busman, ED physician PCP: Colette Ribas, MD   Chief Complaint: Left leg infection  HPI: Amy Clay is a 74 y.o. female  With her history of dementia with mood disorder, hypertension not currently on medications, obesity, depression, physical deconditioning with fall. Due to the patient's dementia, the patient is not a reliable historian and the history is given by the patient's daughter as well as medical chart. The patient was seen on 03/12/2015 by Dr. Fayrene Fearing after falling at [facility] nursing home she resides. The patient ended up with a laceration on the left lower leg that was repaired using 3-0 and 4-0 nylon sutures, both simple as well as running/locking. Patient was doing well until yesterday when the daughter went to visit and noticed that her leg was red with purulent drainage coming from the wound - on Monday the laceration had appeared well-healing. No palliating or provoking factors. Patient has been eating fairly normally for her, which is a diminished appetite. Additionally, due to pain in her right leg, patient had an ultrasound which revealed a DVT.  Review of Systems:   Pt denies any fevers, chills, nausea, vomiting, diarrhea, constipation, abdominal pain, shortness of breath, dyspnea on exertion, orthopnea, cough, wheezing, palpitations, headache, vision changes, lightheadedness, dizziness, diarrhea, constipation, melena, rectal bleeding.  Review of systems are otherwise negative  Past Medical History  Diagnosis Date  . High blood pressure   . Arthritis   . Depression   . Anxiety   . Hemorrhoids   . Hyperlipidemia   . Bipolar 1 disorder (HCC)   . Dysphagia   . Altered mental status   . Localized edema   . Weakness   . GERD (gastroesophageal reflux disease)   . Chronic kidney disease   . Dementia   . Hypoglycemia   . Thrombocytopenia (HCC)   .  Osteoarthritis    Past Surgical History  Procedure Laterality Date  . Back surgery  03/16/87  . Cholecystectomy  08/15/93  . Partial hysterectomy  04/04/95  . Bladder tacked  04/04/95  . Ankle surgery  04/28/97    right ankle fracture  . Wrist surgery  02/24/09    right wrist fracture  . Colonoscopy  10/26/2002    Dr. Karilyn Cota- small external hemorrhoids otherwise normal   . Breast biopsy  05/16/95    right side  . Savory dilation  10/31/2011    Procedure: SAVORY DILATION;  Surgeon: Corbin Ade, MD;  Location: [facility];  Service: Endoscopy;  Laterality: N/A;  Elease Hashimoto dilation  10/31/2011    Procedure: Elease Hashimoto DILATION;  Surgeon: Corbin Ade, MD;  Location: [facility];  Service: Endoscopy;  Laterality: N/A;  . Abdominal hysterectomy     Social History:  reports that she has never smoked. She has never used smokeless tobacco. She reports that she does not drink alcohol or use illicit drugs. Patient lives at [facility] nursing home & is able to participate in activities of daily living with great assistance  Allergies  Allergen Reactions  . Benzodiazepines Other (See Comments)    Memory problems on Ativan, which goes away when she is off of it.   . Ciprofloxacin     Rash at site of injection with itching  . Cephalexin Rash and Other (See Comments)    Blistering of the mouth  . Penicillins Rash    Family History  Problem Relation Age of Onset  .  Arthritis    . Asthma    . Diabetes    . Colon cancer Neg Hx   . Anxiety disorder Paternal Aunt   . Anxiety disorder Paternal Uncle   . Bipolar disorder Cousin      Prior to Admission medications   Medication Sig Start Date End Date Taking? Authorizing Provider  acetaminophen (TYLENOL) 500 MG tablet Take 1,000 mg by mouth every 6 (six) hours as needed for pain.   Yes Historical Provider, MD  acetaminophen (TYLENOL) 650 MG CR tablet Take 650 mg by mouth every 6 (six) hours as needed for pain.   Yes Historical Provider, MD    Amino Acids-Protein Hydrolys (FEEDING SUPPLEMENT, PRO-STAT SUGAR FREE 64,) LIQD Take 30 mLs by mouth 2 (two) times daily.   Yes Historical Provider, MD  aspirin EC 81 MG tablet Take 81 mg by mouth every morning.    Yes Historical Provider, MD  benztropine (COGENTIN) 0.5 MG tablet Take 1 tablet (0.5 mg total) by mouth 2 (two) times daily. 03/01/15 02/29/16 Yes Myrlene Broker, MD  bisacodyl (DULCOLAX) 10 MG suppository Place 10 mg rectally every 8 (eight) hours as needed for moderate constipation.   Yes Historical Provider, MD  buPROPion (WELLBUTRIN XL) 300 MG 24 hr tablet Take 1 tablet (300 mg total) by mouth every morning. 03/01/15 02/29/16 Yes Myrlene Broker, MD  Calcium Carb-Vit D-C-E-Mineral (OS-CAL ULTRA) 600 MG TABS Take by mouth 2 (two) times daily.   Yes Historical Provider, MD  Cholecalciferol (VITAMIN D) 2000 UNITS CAPS Take 1 capsule by mouth every morning.    Yes Historical Provider, MD  clonazePAM (KLONOPIN) 0.5 MG tablet Take daily at 5 pm Patient taking differently: Take 0.5 mg by mouth 2 (two) times daily as needed for anxiety. Take daily at 5 pm 03/01/15  Yes Myrlene Broker, MD  docusate sodium (STOOL SOFTENER) 100 MG capsule Take 200 mg by mouth at bedtime.   Yes Historical Provider, MD  furosemide (LASIX) 20 MG tablet Take 20 mg by mouth every Monday, Wednesday, and Friday.  01/26/14  Yes Historical Provider, MD  lamoTRIgine (LAMICTAL) 100 MG tablet Take 1 tablet (100 mg total) by mouth 2 (two) times daily. 03/01/15  Yes Myrlene Broker, MD  loratadine (CLARITIN) 10 MG tablet Take 10 mg by mouth every 12 (twelve) hours as needed for allergies.    Yes Historical Provider, MD  Multiple Vitamin (MULTIVITAMIN) capsule Take 1 capsule by mouth daily.   Yes Historical Provider, MD  multivitamin (PROSIGHT) TABS tablet Take 1 tablet by mouth daily.   Yes Historical Provider, MD  Nutritional Supplements (RESOURCE 2.0) LIQD Take 120 mLs by mouth 2 (two) times daily.   Yes Historical Provider,  MD  omeprazole (PRILOSEC) 40 MG capsule Take 40 mg by mouth daily.   Yes Historical Provider, MD  ondansetron (ZOFRAN) 4 MG tablet Take 4 mg by mouth every 8 (eight) hours as needed for nausea or vomiting.   Yes Historical Provider, MD  oxybutynin (DITROPAN-XL) 10 MG 24 hr tablet Take 10 mg by mouth daily.    Yes Historical Provider, MD  PARoxetine (PAXIL) 40 MG tablet Take 1 tablet (40 mg total) by mouth at bedtime. 03/01/15 02/29/16 Yes Myrlene Broker, MD  polyethylene glycol [facility] Ethelene Hal) packet Take 17 g by mouth daily.    Yes Historical Provider, MD  risperiDONE (RISPERDAL) 0.25 MG tablet Take 1 tablet (0.25 mg total) by mouth 3 (three) times daily. Takes at 8:00AM, 2:00PM, 5:00PM. 03/01/15  Yes Myrlene Brokereborah R Ross, MD  risperiDONE (RISPERDAL) 1 MG tablet Take 1 tablet (1 mg total) by mouth at bedtime. 03/01/15  Yes Myrlene Brokereborah R Ross, MD  senna (SENOKOT) 8.6 MG TABS tablet Take 1 tablet by mouth 2 (two) times daily.   Yes Historical Provider, MD  terazosin (HYTRIN) 2 MG capsule Take 2 mg by mouth at bedtime.    Yes Historical Provider, MD    Physical Exam: BP 94/56 mmHg  Pulse 69  Temp(Src) 99.4 F (37.4 C) (Rectal)  Resp 18  Ht 5\' 6"  (1.676 m)  Wt 114.76 kg (253 lb)  BMI 40.85 kg/m2  SpO2 99%  General: Elderly Caucasian female. Awake and alert and oriented x3. No acute cardiopulmonary distress.  Eyes: Pupils equal, round, reactive to light. Extraocular muscles are intact. Sclerae anicteric and noninjected.  ENT:  Moist mucosal membranes. No mucosal lesions.   Neck: Neck supple without lymphadenopathy. No carotid bruits. No masses palpated.  Cardiovascular: Regular rate with normal S1-S2 sounds. No murmurs, rubs, gallops auscultated. No JVD.  Respiratory: Good respiratory effort with no wheezes, rales, rhonchi. Lungs clear to auscultation bilaterally.  Abdomen: Soft, nontender, nondistended. Active bowel sounds. No masses or hepatosplenomegaly  Skin: Dry, warm to touch. 2+ dorsalis  pedis and radial pulses. There is a large erythemic area covering most of her left anterior shin that extends up the lateral side towards the knee. In the center of the erythemic area there is a approximately 5 x 3 open wound that extends down to the subcutaneous layer. The area is hemostatic and no obvious purulent drainage noted. This area was demarcated. Musculoskeletal: No calf or leg pain. All major joints not erythematous nontender.  Psychiatric: Intact judgment and insight.  Neurologic: No focal neurological deficits. Cranial nerves II through XII are grossly intact.           Labs on Admission:  Basic Metabolic Panel:  Recent Labs Lab 03/24/15 1229  NA 138  K 4.2  CL 104  CO2 27  GLUCOSE 105*  BUN 25*  CREATININE 1.33*  CALCIUM 9.7   Liver Function Tests: No results for input(s): AST, ALT, ALKPHOS, BILITOT, PROT, ALBUMIN in the last 168 hours. No results for input(s): LIPASE, AMYLASE in the last 168 hours. No results for input(s): AMMONIA in the last 168 hours. CBC:  Recent Labs Lab 03/24/15 1229  WBC 5.4  NEUTROABS 4.0  HGB 11.5*  HCT 36.8  MCV 98.1  PLT 273   Cardiac Enzymes: No results for input(s): CKTOTAL, CKMB, CKMBINDEX, TROPONINI in the last 168 hours.  BNP (last 3 results) No results for input(s): BNP in the last 8760 hours.  ProBNP (last 3 results) No results for input(s): PROBNP in the last 8760 hours.  CBG: No results for input(s): GLUCAP in the last 168 hours.  Radiological Exams on Admission: [place] Venous Img Lower Bilateral  03/24/2015  CLINICAL DATA:  Bilateral leg pain and swelling following fall on 03/12/2015 history of mobile ultrasound showing right leg deep venous thrombosis. EXAM: BILATERAL LOWER EXTREMITY VENOUS DOPPLER ULTRASOUND TECHNIQUE: Gray-scale sonography with graded compression, as well as color Doppler and duplex ultrasound were performed to evaluate the lower extremity deep venous systems from the level of the common femoral  vein and including the common femoral, femoral, profunda femoral, popliteal and calf veins including the posterior tibial, peroneal and gastrocnemius veins when visible. The superficial great saphenous vein was also interrogated. Spectral Doppler was utilized to evaluate flow at rest and with distal augmentation maneuvers  in the common femoral, femoral and popliteal veins. COMPARISON:  11/29/2013 FINDINGS: RIGHT LOWER EXTREMITY Common Femoral Vein: Nonocclusive thrombus is noted. Decreased compressibility is seen. Saphenofemoral Junction: No evidence of thrombus. Normal compressibility and flow on color Doppler imaging. Profunda Femoral Vein: Nonocclusive thrombus is noted. Decreased compressibility is seen. Femoral Vein: Nonocclusive thrombus is noted. Decreased compressibility is seen. Popliteal Vein: Nonocclusive thrombus is noted. Decreased compressibility is seen. Calf Veins: Nonocclusive thrombus is noted. Decreased compressibility is seen. Superficial Great Saphenous Vein: No evidence of thrombus. Normal compressibility and flow on color Doppler imaging. Venous Reflux:  None. Other Findings:  None. LEFT LOWER EXTREMITY Common Femoral Vein: No evidence of thrombus. Normal compressibility, respiratory phasicity and response to augmentation. Saphenofemoral Junction: No evidence of thrombus. Normal compressibility and flow on color Doppler imaging. Profunda Femoral Vein: No evidence of thrombus. Normal compressibility and flow on color Doppler imaging. Femoral Vein: No evidence of thrombus. Normal compressibility, respiratory phasicity and response to augmentation. Popliteal Vein: No evidence of thrombus. Normal compressibility, respiratory phasicity and response to augmentation. Calf Veins: No evidence of thrombus. Normal compressibility and flow on color Doppler imaging. Superficial Great Saphenous Vein: No evidence of thrombus. Normal compressibility and flow on color Doppler imaging. Venous Reflux:  None.  Other Findings:  None. IMPRESSION: No evidence of deep venous thrombosis on the left. Diffuse deep venous thrombosis from the common femoral vein into the calf veins on the right. Electronically Signed   By: Alcide Clever M.D.   On: 03/24/2015 14:22    Assessment/Plan Present on Admission:  . DVT (deep venous thrombosis), unspecified laterality . CELLULITIS/ABSCESS, LEG . Dementia . CKD (chronic kidney disease) stage 3, GFR 30-59 ml/min . Physical deconditioning . Heel ulcer (HCC)  This patient was discussed with the ED physician, including pertinent vitals, physical exam findings, labs, and imaging.  We also discussed care given by the ED provider.  #1 DVT  Admit to MedSurg  Continue IV heparin  CBC tomorrow  Pharmacy consult for heparin #2 cellulitis/abscess of left lower leg  Although the patient received dose of vancomycin in the emergency department, I believe that is very reasonable for the patient to be treated with clindamycin as this should cover MRSA as well as MSSA, which are the most likely etiologies.  Wound culture obtained  Repeat CBC  We'll consult wound care #3 heel ulcer  Consults wound care #4 chronic kidney disease  Consult pharmacy to renally dose medications #5 dementia with mood disorders  Continue home regimen  We'll continue to reorient patient to help prevent sundowning #6 physical deconditioning  Continue with meals as well as oral calorie supplements  DVT prophylaxis: On heparin  Consultants: None  Code Status: Modified CODE STATUS: Patient would like CPR, meds, defibrillation but does not wish INTUBATATION  Family Communication: Daughter in the room   Disposition Plan: Admission   Levie Heritage, DO [facility] Pager [phone]

## 2015-03-24 NOTE — ED Provider Notes (Signed)
CSN: [id number]     Arrival date & time 03/24/15  1100 History  By signing my name below, I, Amy Clay, attest that this documentation has been prepared under the direction and in the presence of Lorre NickAnthony Wynonna Fitzhenry, MD. Electronically Signed: Lyndel SafeKaitlyn Clay, ED Scribe. 03/24/2015. 12:01 PM.  Chief Complaint  Patient presents with  . Wound Infection  . DVT   The history is provided by a relative. The history is limited by the condition of the patient. No language interpreter was used.   HPI Comments: Amy Clay is a 74 y.o. female brought in by ambulance, who presents to the Emergency Department complaining of a wound to left lateral mid shin with increasingly surrounding erythema s/p fall that occurred 12 days ago. Pt resides at [facility]. Sister reports a fall 12 days ago when she was subsequently seen in the ED and received sutures to the area. Per sister, the PA at the nursing home believes the pt's LLE is infected. The area has an open wound with sutures in place with spreading erythema. Sister also states the pt had an outpatient US of her BLE late yesterday evening and the US tech believes the pt has a possible DVT in right leg, however the study has not resulted. Pt on home O2 at [facility]. Sister denies fevers, new or worsening SOB.    This history was provided by the pt's sister as the pt is currently non-verbal. Sister notes the pt is more interactive at baseline.   Past Medical History  Diagnosis Date  . High blood pressure   . Arthritis   . Depression   . Anxiety   . Hemorrhoids   . Hyperlipidemia   . Bipolar 1 disorder (HCC)   . Dysphagia   . Altered mental status   . Localized edema   . Weakness   . GERD (gastroesophageal reflux disease)   . Chronic kidney disease   . Dementia   . Hypoglycemia   . Thrombocytopenia (HCC)   . Osteoarthritis    Past Surgical History  Procedure Laterality Date  . Back surgery  03/16/87  . Cholecystectomy  08/15/93  . Partial  hysterectomy  04/04/95  . Bladder tacked  04/04/95  . Ankle surgery  04/28/97    right ankle fracture  . Wrist surgery  02/24/09    right wrist fracture  . Colonoscopy  10/26/2002    Dr. Karilyn Cotaehman- small external hemorrhoids otherwise normal   . Breast biopsy  05/16/95    right side  . Savory dilation  10/31/2011    Procedure: SAVORY DILATION;  Surgeon: Corbin Adeobert M Rourk, MD;  Location: [facility];  Service: Endoscopy;  Laterality: N/A;  Elease Hashimoto. Maloney dilation  10/31/2011    Procedure: Elease HashimotoMALONEY DILATION;  Surgeon: Corbin Adeobert M Rourk, MD;  Location: [facility];  Service: Endoscopy;  Laterality: N/A;  . Abdominal hysterectomy     Family History  Problem Relation Age of Onset  . Arthritis    . Asthma    . Diabetes    . Colon cancer Neg Hx   . Anxiety disorder Paternal Aunt   . Anxiety disorder Paternal Uncle   . Bipolar disorder Cousin    Social History  Substance Use Topics  . Smoking status: Never Smoker   . Smokeless tobacco: Never Used  . Alcohol Use: No   OB History    Gravida Para Term Preterm AB TAB SAB Ectopic Multiple Living            0  Review of Systems  Unable to perform ROS: Patient nonverbal  Skin: Positive for wound.   Allergies  Benzodiazepines; Ciprofloxacin; Cephalexin; and Penicillins  Home Medications   Prior to Admission medications   Medication Sig Start Date End Date Taking? Authorizing Provider  acetaminophen (TYLENOL) 500 MG tablet Take 1,000 mg by mouth every 6 (six) hours as needed for pain.   Yes Historical Provider, MD  acetaminophen (TYLENOL) 650 MG CR tablet Take 650 mg by mouth every 6 (six) hours as needed for pain.   Yes Historical Provider, MD  Amino Acids-Protein Hydrolys (FEEDING SUPPLEMENT, PRO-STAT SUGAR FREE 64,) LIQD Take 30 mLs by mouth 2 (two) times daily.   Yes Historical Provider, MD  aspirin EC 81 MG tablet Take 81 mg by mouth every morning.    Yes Historical Provider, MD  benztropine (COGENTIN) 0.5 MG tablet Take 1 tablet (0.5 mg  total) by mouth 2 (two) times daily. 03/01/15 02/29/16 Yes Myrlene Broker, MD  bisacodyl (DULCOLAX) 10 MG suppository Place 10 mg rectally every 8 (eight) hours as needed for moderate constipation.   Yes Historical Provider, MD  buPROPion (WELLBUTRIN XL) 300 MG 24 hr tablet Take 1 tablet (300 mg total) by mouth every morning. 03/01/15 02/29/16 Yes Myrlene Broker, MD  Calcium Carb-Vit D-C-E-Mineral (OS-CAL ULTRA) 600 MG TABS Take by mouth 2 (two) times daily.   Yes Historical Provider, MD  Cholecalciferol (VITAMIN D) 2000 UNITS CAPS Take 1 capsule by mouth every morning.    Yes Historical Provider, MD  clonazePAM (KLONOPIN) 0.5 MG tablet Take daily at 5 pm Patient taking differently: Take 0.5 mg by mouth 2 (two) times daily as needed for anxiety. Take daily at 5 pm 03/01/15  Yes Myrlene Broker, MD  docusate sodium (STOOL SOFTENER) 100 MG capsule Take 200 mg by mouth at bedtime.   Yes Historical Provider, MD  furosemide (LASIX) 20 MG tablet Take 20 mg by mouth every Monday, Wednesday, and Friday.  01/26/14  Yes Historical Provider, MD  lamoTRIgine (LAMICTAL) 100 MG tablet Take 1 tablet (100 mg total) by mouth 2 (two) times daily. 03/01/15  Yes Myrlene Broker, MD  loratadine (CLARITIN) 10 MG tablet Take 10 mg by mouth every 12 (twelve) hours as needed for allergies.    Yes Historical Provider, MD  Multiple Vitamin (MULTIVITAMIN) capsule Take 1 capsule by mouth daily.   Yes Historical Provider, MD  multivitamin (PROSIGHT) TABS tablet Take 1 tablet by mouth daily.   Yes Historical Provider, MD  Nutritional Supplements (RESOURCE 2.0) LIQD Take 120 mLs by mouth 2 (two) times daily.   Yes Historical Provider, MD  omeprazole (PRILOSEC) 40 MG capsule Take 40 mg by mouth daily.   Yes Historical Provider, MD  ondansetron (ZOFRAN) 4 MG tablet Take 4 mg by mouth every 8 (eight) hours as needed for nausea or vomiting.   Yes Historical Provider, MD  oxybutynin (DITROPAN-XL) 10 MG 24 hr tablet Take 10 mg by mouth  daily.    Yes Historical Provider, MD  PARoxetine (PAXIL) 40 MG tablet Take 1 tablet (40 mg total) by mouth at bedtime. 03/01/15 02/29/16 Yes Myrlene Broker, MD  polyethylene glycol [facility] Ethelene Hal) packet Take 17 g by mouth daily.    Yes Historical Provider, MD  risperiDONE (RISPERDAL) 0.25 MG tablet Take 1 tablet (0.25 mg total) by mouth 3 (three) times daily. Takes at 8:00AM, 2:00PM, 5:00PM. 03/01/15  Yes Myrlene Broker, MD  risperiDONE (RISPERDAL) 1 MG tablet Take 1 tablet (1  mg total) by mouth at bedtime. 03/01/15  Yes Myrlene Broker, MD  senna (SENOKOT) 8.6 MG TABS tablet Take 1 tablet by mouth 2 (two) times daily.   Yes Historical Provider, MD  terazosin (HYTRIN) 2 MG capsule Take 2 mg by mouth at bedtime.    Yes Historical Provider, MD   BP 139/65 mmHg  Pulse 88  Temp(Src) 99.4 F (37.4 C) (Rectal)  Resp 18  Ht  (1.676 m)  Wt 253 lb (114.76 kg)  BMI 40.85 kg/m2  SpO2 96% Physical Exam  Constitutional: She is oriented to person, place, and time. She appears well-developed and well-nourished.  Non-toxic appearance. No distress.  HENT:  Head: Normocephalic and atraumatic.  Eyes: Conjunctivae, EOM and lids are normal. Pupils are equal, round, and reactive to light.  Neck: Normal range of motion. Neck supple. No tracheal deviation present. No thyroid mass present.  Cardiovascular: Normal rate, regular rhythm and normal heart sounds.  Exam reveals no gallop.   No murmur heard. Pulmonary/Chest: Effort normal and breath sounds normal. No stridor. No respiratory distress. She has no decreased breath sounds. She has no wheezes. She has no rhonchi. She has no rales.  Abdominal: Soft. Normal appearance and bowel sounds are normal. She exhibits no distension. There is no tenderness. There is no rebound and no CVA tenderness.  Musculoskeletal: Normal range of motion. She exhibits no edema or tenderness.  Neurological: She is alert and oriented to person, place, and time. She has normal  strength. No cranial nerve deficit or sensory deficit. GCS eye subscore is 4. GCS verbal subscore is 5. GCS motor subscore is 6.  Skin: Skin is warm and dry. No abrasion and no rash noted. There is erythema.  LLE; Left mid lateral lower extremity with dehisced wound with intact sutures and surrounding erythema with lymphangitic spread up the left lateral calf, no crepitus noted, some purulent material noted within the wound.   Psychiatric: She has a normal mood and affect. Her speech is normal and behavior is normal.  Nursing note and vitals reviewed.   ED Course  Procedures  DIAGNOSTIC STUDIES: Oxygen Saturation is 96% on RA, normal by my interpretation.    COORDINATION OF CARE: 11:57 AM Discussed treatment plan with pt at bedside and pt agreed to plan.  Labs Review Labs Reviewed  CBC WITH DIFFERENTIAL/PLATELET - Abnormal; Notable for the following:    RBC 3.75 (*)    Hemoglobin 11.5 (*)    All other components within normal limits  BASIC METABOLIC PANEL - Abnormal; Notable for the following:    Glucose, Bld 105 (*)    BUN 25 (*)    Creatinine, Ser 1.33 (*)    GFR calc non Af Amer 38 (*)    GFR calc Af Amer 44 (*)    All other components within normal limits  PROTIME-INR  APTT  HEPARIN LEVEL (UNFRACTIONATED)    Imaging Review US Venous Img Lower Bilateral  03/24/2015  CLINICAL DATA:  Bilateral leg pain and swelling following fall on 03/12/2015 history of mobile ultrasound showing right leg deep venous thrombosis. EXAM: BILATERAL LOWER EXTREMITY VENOUS DOPPLER ULTRASOUND TECHNIQUE: Gray-scale sonography with graded compression, as well as color Doppler and duplex ultrasound were performed to evaluate the lower extremity deep venous systems from the level of the common femoral vein and including the common femoral, femoral, profunda femoral, popliteal and calf veins including the posterior tibial, peroneal and gastrocnemius veins when visible. The superficial great saphenous vein  was also interrogated.  Spectral Doppler was utilized to evaluate flow at rest and with distal augmentation maneuvers in the common femoral, femoral and popliteal veins. COMPARISON:  11/29/2013 FINDINGS: RIGHT LOWER EXTREMITY Common Femoral Vein: Nonocclusive thrombus is noted. Decreased compressibility is seen. Saphenofemoral Junction: No evidence of thrombus. Normal compressibility and flow on color Doppler imaging. Profunda Femoral Vein: Nonocclusive thrombus is noted. Decreased compressibility is seen. Femoral Vein: Nonocclusive thrombus is noted. Decreased compressibility is seen. Popliteal Vein: Nonocclusive thrombus is noted. Decreased compressibility is seen. Calf Veins: Nonocclusive thrombus is noted. Decreased compressibility is seen. Superficial Great Saphenous Vein: No evidence of thrombus. Normal compressibility and flow on color Doppler imaging. Venous Reflux:  None. Other Findings:  None. LEFT LOWER EXTREMITY Common Femoral Vein: No evidence of thrombus. Normal compressibility, respiratory phasicity and response to augmentation. Saphenofemoral Junction: No evidence of thrombus. Normal compressibility and flow on color Doppler imaging. Profunda Femoral Vein: No evidence of thrombus. Normal compressibility and flow on color Doppler imaging. Femoral Vein: No evidence of thrombus. Normal compressibility, respiratory phasicity and response to augmentation. Popliteal Vein: No evidence of thrombus. Normal compressibility, respiratory phasicity and response to augmentation. Calf Veins: No evidence of thrombus. Normal compressibility and flow on color Doppler imaging. Superficial Great Saphenous Vein: No evidence of thrombus. Normal compressibility and flow on color Doppler imaging. Venous Reflux:  None. Other Findings:  None. IMPRESSION: No evidence of deep venous thrombosis on the left. Diffuse deep venous thrombosis from the common femoral vein into the calf veins on the right. Electronically Signed   By:  Alcide Clever M.D.   On: 03/24/2015 14:22   I have personally reviewed and evaluated these images and lab results as part of my medical decision-making.   EKG Interpretation None      MDM   Final diagnoses:  None    I personally performed the services described in this documentation, which was scribed in my presence. The recorded information has been reviewed and is accurate.   I will remove the patient's sutures from her infected wound. Started on vancomycin for her infection. Also started on heparin per pharmacy for her DVT. Consult medicine for admission   Lorre Nick, MD 03/24/15 1457

## 2015-03-25 DIAGNOSIS — F0390 Unspecified dementia without behavioral disturbance: Secondary | ICD-10-CM

## 2015-03-25 LAB — BASIC METABOLIC PANEL
Anion gap: 6 (ref 5–15)
BUN: 22 mg/dL — AB (ref 6–20)
CHLORIDE: 106 mmol/L (ref 101–111)
CO2: 29 mmol/L (ref 22–32)
Calcium: 9.2 mg/dL (ref 8.9–10.3)
Creatinine, Ser: 1.39 mg/dL — ABNORMAL HIGH (ref 0.44–1.00)
GFR calc non Af Amer: 36 mL/min — ABNORMAL LOW (ref >60)
GFR, EST AFRICAN AMERICAN: 42 mL/min — AB (ref >60)
Glucose, Bld: 90 mg/dL (ref 65–99)
POTASSIUM: 3.8 mmol/L (ref 3.5–5.1)
SODIUM: 141 mmol/L (ref 135–145)

## 2015-03-25 LAB — CBC
HEMATOCRIT: 36.5 % (ref 36.0–46.0)
HEMOGLOBIN: 11.2 g/dL — AB (ref 12.0–15.0)
MCH: 30.4 pg (ref 26.0–34.0)
MCHC: 30.7 g/dL (ref 30.0–36.0)
MCV: 98.9 fL (ref 78.0–100.0)
Platelets: 293 10*3/uL (ref 150–400)
RBC: 3.69 MIL/uL — AB (ref 3.87–5.11)
RDW: 14.3 % (ref 11.5–15.5)
WBC: 5.1 10*3/uL (ref 4.0–10.5)

## 2015-03-25 LAB — HEPARIN LEVEL (UNFRACTIONATED)
HEPARIN UNFRACTIONATED: 0.97 [IU]/mL — AB (ref 0.30–0.70)
HEPARIN UNFRACTIONATED: 0.64 [IU]/mL (ref 0.30–0.70)
Heparin Unfractionated: 0.98 IU/mL — ABNORMAL HIGH (ref 0.30–0.70)

## 2015-03-25 MED ORDER — COUMADIN BOOK
Freq: Once | Status: DC
  Filled 2015-03-25: qty 1

## 2015-03-25 MED ORDER — WARFARIN SODIUM 5 MG PO TABS
5.0000 mg | ORAL_TABLET | Freq: Once | ORAL | Status: AC
  Administered 2015-03-25: 5 mg via ORAL
  Filled 2015-03-25: qty 1

## 2015-03-25 MED ORDER — WARFARIN VIDEO: Freq: Once | Status: DC

## 2015-03-25 MED ORDER — WARFARIN - PHARMACIST DOSING INPATIENT
Freq: Every day | Status: DC
  Administered 2015-03-25 – 2015-03-26 (×2)

## 2015-03-25 NOTE — Progress Notes (Signed)
CM communicated with doctor regarding the patient's insurance and anticoagulants. Patient will be placed on Coumadin and monitored.

## 2015-03-25 NOTE — Progress Notes (Addendum)
ANTICOAGULATION CONSULT NOTE   Pharmacy Consult for heparin Indication: DVT  Allergies  Allergen Reactions  . Benzodiazepines Other (See Comments)    Memory problems on Ativan, which goes away when she is off of it.   . Ciprofloxacin     Rash at site of injection with itching  . Cephalexin Rash and Other (See Comments)    Blistering of the mouth  . Penicillins Rash    Patient Measurements: Height: 5\' 6"  (167.6 cm) Weight: 253 lb (114.76 kg) IBW/kg (Calculated) : 59.3 Heparin Dosing Weight: 86.3 kg  Vital Signs: Temp: 98.1 F (36.7 C) (11/12 0604) Temp Source: Oral (11/12 0604) BP: 138/69 mmHg (11/12 0604) Pulse Rate: 68 (11/12 0604)  Labs:  Recent Labs  03/24/15 1229 03/24/15 2055 03/25/15 0555  HGB 11.5*  --  11.2*  HCT 36.8  --  36.5  PLT 273  --  293  APTT 30  --   --   LABPROT 14.8  --   --   INR 1.14  --   --   HEPARINUNFRC  --  0.69 0.98*  CREATININE 1.33*  --  1.39*    Estimated Creatinine Clearance: 45 mL/min (by C-G formula based on Cr of 1.39).   Medical History: Past Medical History  Diagnosis Date  . High blood pressure   . Arthritis   . Depression   . Anxiety   . Hemorrhoids   . Hyperlipidemia   . Bipolar 1 disorder (HCC)   . Dysphagia   . Altered mental status   . Localized edema   . Weakness   . GERD (gastroesophageal reflux disease)   . Chronic kidney disease   . Dementia   . Hypoglycemia   . Thrombocytopenia (HCC)   . Osteoarthritis     Medications:  See medication history  Assessment: 74 yo lady to start heparin for DVT.  She was not on anticoagulants prior to admission.  Heparin level this am is elevated.  No bleeding complications noted Goal of Therapy:  Heparin level 0.3-0.7 units/ml Monitor platelets by anticoagulation protocol: Yes   Plan:  Decrease heparin to 1200 units/hr Check heparin level 8 hours after rate change Daily CBC Monitor for bleeding complications and change to oral anticoagulation.  Thanks  for allowing pharmacy to be a part of this patient's care.  Talbert CageLora Ritta Hammes, PharmD Clinical Pharmacist  03/25/2015,7:31 AM  Add coumadin, 5 mg po today. Check daily PT/INR

## 2015-03-25 NOTE — Progress Notes (Signed)
[facility] PROGRESS NOTE  Amy Clay ZOX:[id number] DOB: 08-02-1940 DOA: 03/24/2015 PCP: Colette Ribas, MD  HPI/Brief narrative 74 y.o. female with history of dementia with mood disorder, hypertension not currently on medications, obesity, depression, physical deconditioning with fall. Pt presented with new RLE DVT as well as LLE cellulitis s/p fall.  Assessment/Plan: 1. RLE DVT 1. Noted on LE dopplers 2. Pt has been continued on heparin gtt thus far 3. Consider initiating NOAC 2. LLE Cellulitis 1. Patch of erythema appear improved overnight 2. Pt is currently continued on clindamycin 3. Afebrile. No leukocytosis 3. Heel pressure ulcers 1. Wound care consulted 4. Dementia 1. Seems stable at present 2. Pt is pleasantly confused 5. HTN 1. BP stable 2. Cont to monitor 6. Depression 1. Mood appears appropriate this AM  Code Status: No intubation, CPR and other resuscitative efforts OK Family Communication: Pt in room Disposition Plan: Pending return to SNF when on PO anticoagulant and stable   Consultants:    Procedures:    Antibiotics: Anti-infectives    Start     Dose/Rate Route Frequency Ordered Stop   03/24/15 1715  clindamycin (CLEOCIN) IVPB 600 mg     600 mg 100 mL/hr over 30 Minutes Intravenous 3 times per day 03/24/15 1707     03/24/15 1445  vancomycin (VANCOCIN) IVPB 1000 mg/200 mL premix     1,000 mg 200 mL/hr over 60 Minutes Intravenous  Once 03/24/15 1434 03/24/15 1538      HPI/Subjective: No complaints this AM  Objective: Filed Vitals:   03/24/15 1635 03/24/15 2207 03/25/15 0604 03/25/15 0936  BP: 127/76 137/75 138/69 135/58  Pulse: 71 70 68 90  Temp:  97.5 F (36.4 C) 98.1 F (36.7 C) 97.8 F (36.6 C)  TempSrc:  Oral Oral Oral  Resp: Height:      Weight:      SpO2: 100% 98% 99%    No intake or output data in the 24 hours ending 03/25/15 1043 Filed Weights   03/24/15 1126  Weight: 114.76 kg (253 lb)     Exam:   General:  Awake, in nad  Cardiovascular: regular, s1, s2  Respiratory: normal resp effort, no wheezing  Abdomen: soft,nondistended  Musculoskeletal: perfused, patch of erythema over LLE with central ulceration noted with receding erythematous border  Data Reviewed: Basic Metabolic Panel:  Recent Labs Lab 03/24/15 1229 03/25/15 0555  NA 138 141  K 4.2 3.8  CL 104 106  CO2 27 29  GLUCOSE 105* 90  BUN 25* 22*  CREATININE 1.33* 1.39*  CALCIUM 9.7 9.2   Liver Function Tests: No results for input(s): AST, ALT, ALKPHOS, BILITOT, PROT, ALBUMIN in the last 168 hours. No results for input(s): LIPASE, AMYLASE in the last 168 hours. No results for input(s): AMMONIA in the last 168 hours. CBC:  Recent Labs Lab 03/24/15 1229 03/25/15 0555  WBC 5.4 5.1  NEUTROABS 4.0  --   HGB 11.5* 11.2*  HCT 36.8 36.5  MCV 98.1 98.9  PLT 273 293   Cardiac Enzymes: No results for input(s): CKTOTAL, CKMB, CKMBINDEX, TROPONINI in the last 168 hours. BNP (last 3 results) No results for input(s): BNP in the last 8760 hours.  ProBNP (last 3 results) No results for input(s): PROBNP in the last 8760 hours.  CBG: No results for input(s): GLUCAP in the last 168 hours.  No results found for this or any previous visit (from the past 240 hour(s)).   Studies: US Venous Img  Lower Bilateral  03/24/2015  CLINICAL DATA:  Bilateral leg pain and swelling following fall on 03/12/2015 history of mobile ultrasound showing right leg deep venous thrombosis. EXAM: BILATERAL LOWER EXTREMITY VENOUS DOPPLER ULTRASOUND TECHNIQUE: Gray-scale sonography with graded compression, as well as color Doppler and duplex ultrasound were performed to evaluate the lower extremity deep venous systems from the level of the common femoral vein and including the common femoral, femoral, profunda femoral, popliteal and calf veins including the posterior tibial, peroneal and gastrocnemius veins when visible. The  superficial great saphenous vein was also interrogated. Spectral Doppler was utilized to evaluate flow at rest and with distal augmentation maneuvers in the common femoral, femoral and popliteal veins. COMPARISON:  11/29/2013 FINDINGS: RIGHT LOWER EXTREMITY Common Femoral Vein: Nonocclusive thrombus is noted. Decreased compressibility is seen. Saphenofemoral Junction: No evidence of thrombus. Normal compressibility and flow on color Doppler imaging. Profunda Femoral Vein: Nonocclusive thrombus is noted. Decreased compressibility is seen. Femoral Vein: Nonocclusive thrombus is noted. Decreased compressibility is seen. Popliteal Vein: Nonocclusive thrombus is noted. Decreased compressibility is seen. Calf Veins: Nonocclusive thrombus is noted. Decreased compressibility is seen. Superficial Great Saphenous Vein: No evidence of thrombus. Normal compressibility and flow on color Doppler imaging. Venous Reflux:  None. Other Findings:  None. LEFT LOWER EXTREMITY Common Femoral Vein: No evidence of thrombus. Normal compressibility, respiratory phasicity and response to augmentation. Saphenofemoral Junction: No evidence of thrombus. Normal compressibility and flow on color Doppler imaging. Profunda Femoral Vein: No evidence of thrombus. Normal compressibility and flow on color Doppler imaging. Femoral Vein: No evidence of thrombus. Normal compressibility, respiratory phasicity and response to augmentation. Popliteal Vein: No evidence of thrombus. Normal compressibility, respiratory phasicity and response to augmentation. Calf Veins: No evidence of thrombus. Normal compressibility and flow on color Doppler imaging. Superficial Great Saphenous Vein: No evidence of thrombus. Normal compressibility and flow on color Doppler imaging. Venous Reflux:  None. Other Findings:  None. IMPRESSION: No evidence of deep venous thrombosis on the left. Diffuse deep venous thrombosis from the common femoral vein into the calf veins on the  right. Electronically Signed   By: Alcide CleverMark  Lukens M.D.   On: 03/24/2015 14:22    Scheduled Meds: . aspirin EC  81 mg Oral BH-q7a  . benztropine  0.5 mg Oral BID  . buPROPion  300 mg Oral BH-q7a  . clindamycin (CLEOCIN) IV  600 mg Intravenous 3 times per day  . feeding supplement  1 Container Oral BID BM  . furosemide  20 mg Oral Q M,W,F  . lamoTRIgine  100 mg Oral BID  . oxybutynin  10 mg Oral Daily  . pantoprazole  40 mg Oral Daily  . PARoxetine  40 mg Oral QHS  . polyethylene glycol  17 g Oral Daily  . risperiDONE  0.25 mg Oral TID  . risperiDONE  1 mg Oral QHS  . senna  1 tablet Oral BID  . terazosin  2 mg Oral QHS   Continuous Infusions: . sodium chloride 1,000 mL (03/24/15 1823)  . heparin 1,200 Units/hr (03/25/15 0744)    Active Problems:   CELLULITIS/ABSCESS, LEG   Dementia   Physical deconditioning   CKD (chronic kidney disease) stage 3, GFR 30-59 ml/min   DVT (deep venous thrombosis), unspecified laterality   Heel ulcer (HCC)   CHIU, STEPHEN K  [facility] Pager [phone]. If 7PM-7AM, please contact night-coverage at [email or web address], password [email or web address] 03/25/2015, 10:43 AM  LOS: 1 day

## 2015-03-25 NOTE — Patient Care Conference (Signed)
Tried to call pt's sister, Joyce GrossKay, at both numbers listed for update. No answer. Will try again later.

## 2015-03-25 NOTE — Progress Notes (Signed)
ANTICOAGULATION CONSULT NOTE   Pharmacy Consult for heparin Indication: DVT  Allergies  Allergen Reactions  . Benzodiazepines Other (See Comments)    Memory problems on Ativan, which goes away when she is off of it.   . Ciprofloxacin     Rash at site of injection with itching  . Cephalexin Rash and Other (See Comments)    Blistering of the mouth  . Penicillins Rash    Patient Measurements: Height: 5\' 6"  (167.6 cm) Weight: 253 lb (114.76 kg) IBW/kg (Calculated) : 59.3 Heparin Dosing Weight: 86.3 kg  Vital Signs: Temp: 98.2 F (36.8 C) (11/12 2235) Temp Source: Oral (11/12 2235) BP: 128/72 mmHg (11/12 2235) Pulse Rate: 68 (11/12 2235)  Labs:  Recent Labs  03/24/15 1229  03/25/15 0555 03/25/15 1425 03/25/15 2148  HGB 11.5*  --  11.2*  --   --   HCT 36.8  --  36.5  --   --   PLT 273  --  293  --   --   APTT 30  --   --   --   --   LABPROT 14.8  --   --   --   --   INR 1.14  --   --   --   --   HEPARINUNFRC  --   < > 0.98* 0.97* 0.64  CREATININE 1.33*  --  1.39*  --   --   < > = values in this interval not displayed.  Estimated Creatinine Clearance: 45 mL/min (by C-G formula based on Cr of 1.39).   Medical History: Past Medical History  Diagnosis Date  . High blood pressure   . Arthritis   . Depression   . Anxiety   . Hemorrhoids   . Hyperlipidemia   . Bipolar 1 disorder (HCC)   . Dysphagia   . Altered mental status   . Localized edema   . Weakness   . GERD (gastroesophageal reflux disease)   . Chronic kidney disease   . Dementia   . Hypoglycemia   . Thrombocytopenia (HCC)   . Osteoarthritis     Medications:  See medication history  Assessment: 74 yo lady to start heparin for DVT.  She was not on anticoagulants prior to admission.  Heparin level therapeutic.  No bleeding complications noted Goal of Therapy:  Heparin level 0.3-0.7 units/ml Monitor platelets by anticoagulation protocol: Yes   Plan:  Cont heparin at 1200 units/hr F/u am  labs  Thanks for allowing pharmacy to be a part of this patient's care.  Talbert CageLora Irelyn Perfecto, PharmD Clinical Pharmacist  03/25/2015,10:51 PM

## 2015-03-26 LAB — PROTIME-INR
INR: 1.13 (ref 0.00–1.49)
Prothrombin Time: 14.7 seconds (ref 11.6–15.2)

## 2015-03-26 LAB — CBC
HEMATOCRIT: 34.6 % — AB (ref 36.0–46.0)
HEMOGLOBIN: 10.5 g/dL — AB (ref 12.0–15.0)
MCH: 30.2 pg (ref 26.0–34.0)
MCHC: 30.3 g/dL (ref 30.0–36.0)
MCV: 99.4 fL (ref 78.0–100.0)
Platelets: 287 10*3/uL (ref 150–400)
RBC: 3.48 MIL/uL — AB (ref 3.87–5.11)
RDW: 14.3 % (ref 11.5–15.5)
WBC: 5.4 10*3/uL (ref 4.0–10.5)

## 2015-03-26 LAB — HEPARIN LEVEL (UNFRACTIONATED): HEPARIN UNFRACTIONATED: 0.54 [IU]/mL (ref 0.30–0.70)

## 2015-03-26 MED ORDER — WARFARIN SODIUM 5 MG PO TABS
5.0000 mg | ORAL_TABLET | Freq: Once | ORAL | Status: AC
Start: 2015-03-26 — End: 2015-03-26
  Administered 2015-03-26: 5 mg via ORAL
  Filled 2015-03-26: qty 1

## 2015-03-26 NOTE — Progress Notes (Signed)
ANTICOAGULATION CONSULT NOTE   Pharmacy Consult for heparin, coumadin Indication: DVT  Allergies  Allergen Reactions  . Benzodiazepines Other (See Comments)    Memory problems on Ativan, which goes away when she is off of it.   . Ciprofloxacin     Rash at site of injection with itching  . Cephalexin Rash and Other (See Comments)    Blistering of the mouth  . Penicillins Rash    Patient Measurements: Height: 5\' 6"  (167.6 cm) Weight: 253 lb (114.76 kg) IBW/kg (Calculated) : 59.3 Heparin Dosing Weight: 86.3 kg  Vital Signs: Temp: 98.4 F (36.9 C) (11/13 0606) Temp Source: Oral (11/13 0606) BP: 118/69 mmHg (11/13 0606) Pulse Rate: 72 (11/13 0606)  Labs:  Recent Labs  03/24/15 1229  03/25/15 0555 03/25/15 1425 03/25/15 2148 03/26/15 0614  HGB 11.5*  --  11.2*  --   --  10.5*  HCT 36.8  --  36.5  --   --  34.6*  PLT 273  --  293  --   --  287  APTT 30  --   --   --   --   --   LABPROT 14.8  --   --   --   --  14.7  INR 1.14  --   --   --   --  1.13  HEPARINUNFRC  --   < > 0.98* 0.97* 0.64 0.54  CREATININE 1.33*  --  1.39*  --   --   --   < > = values in this interval not displayed.  Estimated Creatinine Clearance: 45 mL/min (by C-G formula based on Cr of 1.39).   Medical History: Past Medical History  Diagnosis Date  . High blood pressure   . Arthritis   . Depression   . Anxiety   . Hemorrhoids   . Hyperlipidemia   . Bipolar 1 disorder (HCC)   . Dysphagia   . Altered mental status   . Localized edema   . Weakness   . GERD (gastroesophageal reflux disease)   . Chronic kidney disease   . Dementia   . Hypoglycemia   . Thrombocytopenia (HCC)   . Osteoarthritis     Medications:  See medication history  Assessment: 74 yo lady on heparin and coumadin for DVT. This is day 2/5 overlap. Heparin level therapeutic. INR 1.13  Slight decrease in Hg but no bleeding reported. Goal of Therapy:  INR 2-3 Heparin level 0.3-0.7 units/ml Monitor platelets by  anticoagulation protocol: Yes   Plan:  Cont heparin at 1050 units/hr Repeat coumadin 5 mg po today Daily HL, CBC and PT/INR Monitor for bleeding complications  Thanks for allowing pharmacy to be a part of this patient's care.  Talbert CageLora Jovahn Breit, PharmD Clinical Pharmacist  03/26/2015,7:41 AM

## 2015-03-26 NOTE — Progress Notes (Signed)
[facility] PROGRESS NOTE  Amy BoundFrances Clay AVW:[id number] DOB: 31-Oct-1940 DOA: 03/24/2015 PCP: Colette RibasGOLDING, [name], MD  HPI/Brief narrative 74 y.o. female with history of dementia with mood disorder, hypertension not currently on medications, obesity, depression, physical deconditioning with fall. Pt presented with new RLE DVT as well as LLE cellulitis s/p fall.  Assessment/Plan: 1. RLE DVT 1. Noted on LE dopplers 2. Pt has been continued on heparin gtt thus far 3. Have started coumadin, dosing per pharmacy, goal INR 2-3 2. LLE Cellulitis 1. Patch of erythema appear improved overnight 2. Pt is currently continued on clindamycin 3. Afebrile. No leukocytosis 3. Heel pressure ulcers 1. Wound care consulted 4. Dementia 1. Seems stable at present 2. Pt is pleasantly confused 5. HTN 1. BP stable 2. Cont to monitor 6. Depression 1. Mood appears appropriate this AM  Code Status: No intubation, CPR and other resuscitative efforts OK Family Communication: Pt in room Disposition Plan: Pending return to SNF when on therapeutic on coumadin  Consultants:    Procedures:    Antibiotics: Anti-infectives    Start     Dose/Rate Route Frequency Ordered Stop   03/24/15 1715  clindamycin (CLEOCIN) IVPB 600 mg     600 mg 100 mL/hr over 30 Minutes Intravenous 3 times per day 03/24/15 1707     03/24/15 1445  vancomycin (VANCOCIN) IVPB 1000 mg/200 mL premix     1,000 mg 200 mL/hr over 60 Minutes Intravenous  Once 03/24/15 1434 03/24/15 1538      HPI/Subjective: Denies chest pain or sob. Pt is without cmplaints  Objective: Filed Vitals:   03/25/15 0936 03/25/15 1548 03/25/15 2235 03/26/15 0606  BP: 135/58 111/75 128/72 118/69  Pulse: 90 64 68 72  Temp: 97.8 F (36.6 C) 97.5 F (36.4 C) 98.2 F (36.8 C) 98.4 F (36.9 C)  TempSrc: Oral Oral Oral Oral  Resp:  18 18 18   Height:      Weight:      SpO2:  93% 94% 93%   No intake or output data in the 24 hours ending  03/26/15 1149 Filed Weights   03/24/15 1126  Weight: 114.76 kg (253 lb)    Exam:   General:  Awake, laying in bed, in nad  Cardiovascular: regular, s1, s2  Respiratory: normal resp effort, no wheezing  Abdomen: soft,nondistended, pos BS  Musculoskeletal: perfused, patch of erythema over LLE with central ulceration noted with erythematous border continuing to recede form drawn pen line  Data Reviewed: Basic Metabolic Panel:  Recent Labs Lab 03/24/15 1229 03/25/15 0555  NA 138 141  K 4.2 3.8  CL 104 106  CO2 27 29  GLUCOSE 105* 90  BUN 25* 22*  CREATININE 1.33* 1.39*  CALCIUM 9.7 9.2   Liver Function Tests: No results for input(s): AST, ALT, ALKPHOS, BILITOT, PROT, ALBUMIN in the last 168 hours. No results for input(s): LIPASE, AMYLASE in the last 168 hours. No results for input(s): AMMONIA in the last 168 hours. CBC:  Recent Labs Lab 03/24/15 1229 03/25/15 0555 03/26/15 0614  WBC 5.4 5.1 5.4  NEUTROABS 4.0  --   --   HGB 11.5* 11.2* 10.5*  HCT 36.8 36.5 34.6*  MCV 98.1 98.9 99.4  PLT 273 293 287   Cardiac Enzymes: No results for input(s): CKTOTAL, CKMB, CKMBINDEX, TROPONINI in the last 168 hours. BNP (last 3 results) No results for input(s): BNP in the last 8760 hours.  ProBNP (last 3 results) No results for input(s): PROBNP in the last 8760 hours.  CBG: No results for input(s): GLUCAP in the last 168 hours.  No results found for this or any previous visit (from the past 240 hour(s)).   Studies: US Venous Img Lower Bilateral  03/24/2015  CLINICAL DATA:  Bilateral leg pain and swelling following fall on 03/12/2015 history of mobile ultrasound showing right leg deep venous thrombosis. EXAM: BILATERAL LOWER EXTREMITY VENOUS DOPPLER ULTRASOUND TECHNIQUE: Gray-scale sonography with graded compression, as well as color Doppler and duplex ultrasound were performed to evaluate the lower extremity deep venous systems from the level of the common femoral vein  and including the common femoral, femoral, profunda femoral, popliteal and calf veins including the posterior tibial, peroneal and gastrocnemius veins when visible. The superficial great saphenous vein was also interrogated. Spectral Doppler was utilized to evaluate flow at rest and with distal augmentation maneuvers in the common femoral, femoral and popliteal veins. COMPARISON:  11/29/2013 FINDINGS: RIGHT LOWER EXTREMITY Common Femoral Vein: Nonocclusive thrombus is noted. Decreased compressibility is seen. Saphenofemoral Junction: No evidence of thrombus. Normal compressibility and flow on color Doppler imaging. Profunda Femoral Vein: Nonocclusive thrombus is noted. Decreased compressibility is seen. Femoral Vein: Nonocclusive thrombus is noted. Decreased compressibility is seen. Popliteal Vein: Nonocclusive thrombus is noted. Decreased compressibility is seen. Calf Veins: Nonocclusive thrombus is noted. Decreased compressibility is seen. Superficial Great Saphenous Vein: No evidence of thrombus. Normal compressibility and flow on color Doppler imaging. Venous Reflux:  None. Other Findings:  None. LEFT LOWER EXTREMITY Common Femoral Vein: No evidence of thrombus. Normal compressibility, respiratory phasicity and response to augmentation. Saphenofemoral Junction: No evidence of thrombus. Normal compressibility and flow on color Doppler imaging. Profunda Femoral Vein: No evidence of thrombus. Normal compressibility and flow on color Doppler imaging. Femoral Vein: No evidence of thrombus. Normal compressibility, respiratory phasicity and response to augmentation. Popliteal Vein: No evidence of thrombus. Normal compressibility, respiratory phasicity and response to augmentation. Calf Veins: No evidence of thrombus. Normal compressibility and flow on color Doppler imaging. Superficial Great Saphenous Vein: No evidence of thrombus. Normal compressibility and flow on color Doppler imaging. Venous Reflux:  None. Other  Findings:  None. IMPRESSION: No evidence of deep venous thrombosis on the left. Diffuse deep venous thrombosis from the common femoral vein into the calf veins on the right. Electronically Signed   By: Alcide Clever M.D.   On: 03/24/2015 14:22    Scheduled Meds: . aspirin EC  81 mg Oral BH-q7a  . benztropine  0.5 mg Oral BID  . buPROPion  300 mg Oral BH-q7a  . clindamycin (CLEOCIN) IV  600 mg Intravenous 3 times per day  . coumadin book   Does not apply Once  . feeding supplement  1 Container Oral BID BM  . furosemide  20 mg Oral Q M,W,F  . lamoTRIgine  100 mg Oral BID  . oxybutynin  10 mg Oral Daily  . pantoprazole  40 mg Oral Daily  . PARoxetine  40 mg Oral QHS  . polyethylene glycol  17 g Oral Daily  . risperiDONE  0.25 mg Oral TID  . risperiDONE  1 mg Oral QHS  . senna  1 tablet Oral BID  . terazosin  2 mg Oral QHS  . warfarin  5 mg Oral Once  . warfarin   Does not apply Once  . Warfarin - Pharmacist Dosing Inpatient   Does not apply q1800   Continuous Infusions: . sodium chloride 1,000 mL (03/24/15 1823)  . heparin 1,050 Units/hr (03/26/15 0535)    Active Problems:  CELLULITIS/ABSCESS, LEG   Dementia   Physical deconditioning   CKD (chronic kidney disease) stage 3, GFR 30-59 ml/min   DVT (deep venous thrombosis), unspecified laterality   Heel ulcer (HCC)   Hayle Parisi K  [facility] Pager [phone]. If 7PM-7AM, please contact night-coverage at [email or web address], password [facility] 03/26/2015, 11:49 AM  LOS: 2 days

## 2015-03-27 DIAGNOSIS — K648 Other hemorrhoids: Secondary | ICD-10-CM | POA: Diagnosis not present

## 2015-03-27 DIAGNOSIS — R4182 Altered mental status, unspecified: Secondary | ICD-10-CM | POA: Diagnosis not present

## 2015-03-27 DIAGNOSIS — F319 Bipolar disorder, unspecified: Secondary | ICD-10-CM | POA: Diagnosis not present

## 2015-03-27 DIAGNOSIS — M1990 Unspecified osteoarthritis, unspecified site: Secondary | ICD-10-CM | POA: Diagnosis not present

## 2015-03-27 DIAGNOSIS — R627 Adult failure to thrive: Secondary | ICD-10-CM | POA: Diagnosis not present

## 2015-03-27 DIAGNOSIS — R1311 Dysphagia, oral phase: Secondary | ICD-10-CM | POA: Diagnosis not present

## 2015-03-27 DIAGNOSIS — D72829 Elevated white blood cell count, unspecified: Secondary | ICD-10-CM | POA: Diagnosis not present

## 2015-03-27 DIAGNOSIS — L02419 Cutaneous abscess of limb, unspecified: Secondary | ICD-10-CM | POA: Diagnosis not present

## 2015-03-27 DIAGNOSIS — Z79899 Other long term (current) drug therapy: Secondary | ICD-10-CM | POA: Diagnosis not present

## 2015-03-27 DIAGNOSIS — R197 Diarrhea, unspecified: Secondary | ICD-10-CM | POA: Diagnosis not present

## 2015-03-27 DIAGNOSIS — T814XXD Infection following a procedure, subsequent encounter: Secondary | ICD-10-CM | POA: Diagnosis not present

## 2015-03-27 DIAGNOSIS — F419 Anxiety disorder, unspecified: Secondary | ICD-10-CM | POA: Diagnosis not present

## 2015-03-27 DIAGNOSIS — L03116 Cellulitis of left lower limb: Secondary | ICD-10-CM | POA: Diagnosis not present

## 2015-03-27 DIAGNOSIS — R41841 Cognitive communication deficit: Secondary | ICD-10-CM | POA: Diagnosis not present

## 2015-03-27 DIAGNOSIS — F339 Major depressive disorder, recurrent, unspecified: Secondary | ICD-10-CM | POA: Diagnosis not present

## 2015-03-27 DIAGNOSIS — R0602 Shortness of breath: Secondary | ICD-10-CM | POA: Diagnosis not present

## 2015-03-27 DIAGNOSIS — I10 Essential (primary) hypertension: Secondary | ICD-10-CM | POA: Diagnosis not present

## 2015-03-27 DIAGNOSIS — Z7401 Bed confinement status: Secondary | ICD-10-CM | POA: Diagnosis not present

## 2015-03-27 DIAGNOSIS — E162 Hypoglycemia, unspecified: Secondary | ICD-10-CM | POA: Diagnosis not present

## 2015-03-27 DIAGNOSIS — R0682 Tachypnea, not elsewhere classified: Secondary | ICD-10-CM | POA: Diagnosis not present

## 2015-03-27 DIAGNOSIS — R2681 Unsteadiness on feet: Secondary | ICD-10-CM | POA: Diagnosis not present

## 2015-03-27 DIAGNOSIS — L89603 Pressure ulcer of unspecified heel, stage 3: Secondary | ICD-10-CM | POA: Diagnosis not present

## 2015-03-27 DIAGNOSIS — N183 Chronic kidney disease, stage 3 (moderate): Secondary | ICD-10-CM | POA: Diagnosis not present

## 2015-03-27 DIAGNOSIS — E860 Dehydration: Secondary | ICD-10-CM | POA: Diagnosis not present

## 2015-03-27 DIAGNOSIS — D649 Anemia, unspecified: Secondary | ICD-10-CM | POA: Diagnosis not present

## 2015-03-27 DIAGNOSIS — R69 Illness, unspecified: Secondary | ICD-10-CM | POA: Diagnosis not present

## 2015-03-27 DIAGNOSIS — I8290 Acute embolism and thrombosis of unspecified vein: Secondary | ICD-10-CM | POA: Diagnosis not present

## 2015-03-27 DIAGNOSIS — N390 Urinary tract infection, site not specified: Secondary | ICD-10-CM | POA: Diagnosis not present

## 2015-03-27 DIAGNOSIS — Z7901 Long term (current) use of anticoagulants: Secondary | ICD-10-CM | POA: Diagnosis not present

## 2015-03-27 DIAGNOSIS — I82421 Acute embolism and thrombosis of right iliac vein: Secondary | ICD-10-CM | POA: Diagnosis not present

## 2015-03-27 DIAGNOSIS — L97409 Non-pressure chronic ulcer of unspecified heel and midfoot with unspecified severity: Secondary | ICD-10-CM | POA: Diagnosis not present

## 2015-03-27 DIAGNOSIS — D696 Thrombocytopenia, unspecified: Secondary | ICD-10-CM | POA: Diagnosis not present

## 2015-03-27 DIAGNOSIS — N3289 Other specified disorders of bladder: Secondary | ICD-10-CM | POA: Diagnosis not present

## 2015-03-27 DIAGNOSIS — R262 Difficulty in walking, not elsewhere classified: Secondary | ICD-10-CM | POA: Diagnosis not present

## 2015-03-27 DIAGNOSIS — I82409 Acute embolism and thrombosis of unspecified deep veins of unspecified lower extremity: Secondary | ICD-10-CM | POA: Diagnosis not present

## 2015-03-27 DIAGNOSIS — F0280 Dementia in other diseases classified elsewhere without behavioral disturbance: Secondary | ICD-10-CM | POA: Diagnosis not present

## 2015-03-27 DIAGNOSIS — F341 Dysthymic disorder: Secondary | ICD-10-CM | POA: Diagnosis not present

## 2015-03-27 DIAGNOSIS — T8130XD Disruption of wound, unspecified, subsequent encounter: Secondary | ICD-10-CM | POA: Diagnosis not present

## 2015-03-27 DIAGNOSIS — E785 Hyperlipidemia, unspecified: Secondary | ICD-10-CM | POA: Diagnosis not present

## 2015-03-27 DIAGNOSIS — L89623 Pressure ulcer of left heel, stage 3: Secondary | ICD-10-CM | POA: Diagnosis not present

## 2015-03-27 DIAGNOSIS — R6889 Other general symptoms and signs: Secondary | ICD-10-CM | POA: Diagnosis not present

## 2015-03-27 DIAGNOSIS — R279 Unspecified lack of coordination: Secondary | ICD-10-CM | POA: Diagnosis not present

## 2015-03-27 DIAGNOSIS — R5381 Other malaise: Secondary | ICD-10-CM | POA: Diagnosis not present

## 2015-03-27 DIAGNOSIS — L0390 Cellulitis, unspecified: Secondary | ICD-10-CM | POA: Diagnosis not present

## 2015-03-27 DIAGNOSIS — I4891 Unspecified atrial fibrillation: Secondary | ICD-10-CM | POA: Diagnosis not present

## 2015-03-27 DIAGNOSIS — L97829 Non-pressure chronic ulcer of other part of left lower leg with unspecified severity: Secondary | ICD-10-CM | POA: Diagnosis not present

## 2015-03-27 DIAGNOSIS — L02416 Cutaneous abscess of left lower limb: Secondary | ICD-10-CM | POA: Diagnosis not present

## 2015-03-27 DIAGNOSIS — M6281 Muscle weakness (generalized): Secondary | ICD-10-CM | POA: Diagnosis not present

## 2015-03-27 DIAGNOSIS — K219 Gastro-esophageal reflux disease without esophagitis: Secondary | ICD-10-CM | POA: Diagnosis not present

## 2015-03-27 DIAGNOSIS — R600 Localized edema: Secondary | ICD-10-CM | POA: Diagnosis not present

## 2015-03-27 LAB — PROTIME-INR
INR: 1.36 (ref 0.00–1.49)
PROTHROMBIN TIME: 16.9 s — AB (ref 11.6–15.2)

## 2015-03-27 LAB — CBC
HEMATOCRIT: 34.9 % — AB (ref 36.0–46.0)
HEMOGLOBIN: 10.8 g/dL — AB (ref 12.0–15.0)
MCH: 30.4 pg (ref 26.0–34.0)
MCHC: 30.9 g/dL (ref 30.0–36.0)
MCV: 98.3 fL (ref 78.0–100.0)
Platelets: 305 10*3/uL (ref 150–400)
RBC: 3.55 MIL/uL — AB (ref 3.87–5.11)
RDW: 14.1 % (ref 11.5–15.5)
WBC: 5 10*3/uL (ref 4.0–10.5)

## 2015-03-27 LAB — HEPARIN LEVEL (UNFRACTIONATED): Heparin Unfractionated: 0.54 IU/mL (ref 0.30–0.70)

## 2015-03-27 MED ORDER — WARFARIN SODIUM 5 MG PO TABS: 5.0000 mg | ORAL_TABLET | Freq: Once | ORAL | Status: DC

## 2015-03-27 MED ORDER — ENOXAPARIN SODIUM 120 MG/0.8ML ~~LOC~~ SOLN
105.0000 mg | SUBCUTANEOUS | Status: DC
  Filled 2015-03-27 (×2): qty 0.8

## 2015-03-27 MED ORDER — WARFARIN SODIUM 2 MG PO TABS: 4.0000 mg | ORAL_TABLET | Freq: Once | ORAL | Status: DC

## 2015-03-27 MED ORDER — COLLAGENASE 250 UNIT/GM EX OINT
TOPICAL_OINTMENT | Freq: Every day | CUTANEOUS | Status: DC
  Administered 2015-03-27: 13:00:00 via TOPICAL
  Filled 2015-03-27: qty 30

## 2015-03-27 MED ORDER — ENOXAPARIN SODIUM 150 MG/ML ~~LOC~~ SOLN: 105.0000 mg | SUBCUTANEOUS | Status: DC

## 2015-03-27 MED ORDER — ENOXAPARIN SODIUM 120 MG/0.8ML ~~LOC~~ SOLN
115.0000 mg | Freq: Two times a day (BID) | SUBCUTANEOUS | Status: DC
  Administered 2015-03-27: 115 mg via SUBCUTANEOUS
  Filled 2015-03-27 (×5): qty 0.8

## 2015-03-27 MED ORDER — SACCHAROMYCES BOULARDII 250 MG PO CAPS: 250.0000 mg | ORAL_CAPSULE | Freq: Two times a day (BID) | ORAL | Status: DC

## 2015-03-27 MED ORDER — CLINDAMYCIN HCL 300 MG PO CAPS: 300.0000 mg | ORAL_CAPSULE | Freq: Three times a day (TID) | ORAL | Status: DC

## 2015-03-27 MED ORDER — ENOXAPARIN SODIUM 100 MG/ML ~~LOC~~ SOLN: 115.0000 mg | Freq: Two times a day (BID) | SUBCUTANEOUS | Status: DC

## 2015-03-27 NOTE — Clinical Social Work Note (Signed)
Clinical Social Work Assessment  Patient Details  Name: Amy Clay MRN: [id number] Date of Birth: 1941/03/26  Date of referral:  03/27/15               Reason for consult:  Discharge Planning                Permission sought to share information with:  Family Supports Permission granted to share information::  Yes, Verbal Permission Granted  Name::     Insurance underwriter::     Relationship::  sister  Contact Information:     Housing/Transportation Living arrangements for the past 2 months:  [place] of Information:  Patient, Other (Comment Required) (sister) Patient Interpreter Needed:  None Criminal Activity/Legal Involvement Pertinent to Current Situation/Hospitalization:  No - Comment as needed Significant Relationships:  Siblings Lives with:  Facility Resident Do you feel safe going back to the place where you live?  Yes Need for family participation in patient care:  Yes (Comment)  Care giving concerns:  Pt is long term resident at [facility].    Social Worker assessment / plan:  CSW met with pt at bedside. Pt oriented to self and place, but was otherwise pleasantly confused. Per sister, Amy Clay, pt has been a resident at [facility] for about 7 months and was at [facility] prior to this. Amy Clay is very involved and visits pt about every other day. She is aware of d/c today back to SNF and requests [facility] transport. Per Jackelyn Poling at facility, pt is able to feed herself, but requires assist with all other ADLs. Staff use a lift for transfer to wheelchair. Okay for return. Facility will follow wound care recommendations as documented in note today.   Employment status:  Retired Nurse, adult PT Recommendations:  Not assessed at this time Information / Referral to community resources:  Other (Comment Required) (return to [facility])  Patient/Family's Response to care:  Pt and pt's sister agreeable to return to [facility].   Patient/Family's  Understanding of and Emotional Response to Diagnosis, Current Treatment, and Prognosis:  Pt's sister appears to be aware of pt's medical history.   Emotional Assessment Appearance:  Appears stated age Attitude/Demeanor/Rapport:  Other (Cooperative) Affect (typically observed):  Other (pleasantly confused) Orientation:  Oriented to Place, Oriented to Self Alcohol / Substance use:  Not Applicable Psych involvement (Current and /or in the community):  No (Comment)  Discharge Needs  Concerns to be addressed:  No discharge needs identified Readmission within the last 30 days:  No Current discharge risk:  Physical Impairment Barriers to Discharge:  No Barriers Identified   Salome Arnt, [place] 03/27/2015, 1:16 PM [phone]

## 2015-03-27 NOTE — Discharge Summary (Addendum)
Physician Discharge Summary  Amy BoundFrances Clay ZOX:[id number] DOB: [birth year] DOA: 03/24/2015  PCP: Colette RibasGOLDING, [name], MD  Admit date: 03/24/2015 Discharge date: 03/27/2015  Time spent: 20 minutes  Recommendations for Outpatient Follow-up:  1. Follow up with PCP in 1-2 weeks  2. FALL PRECAUTIONS PLEASE 3. Please check daily INR and d/c lovenox when INR therapeutic at 2-3 4. Wound care recs: 1. Add enzymatic debridement ointment to the left leg wound, since the wound is draining and open a bit will need to proceed with clearing the necrotic tissue. Heels are stable but open, will use foam to protect and insulate. Add Prevalon boots to offload the heel wounds.  Discharge Diagnoses:  Active Problems:   CELLULITIS/ABSCESS, LEG   Dementia   Physical deconditioning   CKD (chronic kidney disease) stage 3, GFR 30-59 ml/min   DVT (deep venous thrombosis), unspecified laterality   Heel ulcer (HCC)   Discharge Condition: Improved  Diet recommendation: Regular  Filed Weights    History of present illness:  Please review dictated H and P from 11/11. Briefly, 74 y.o. female with history of dementia with mood disorder, hypertension not currently on medications, obesity, depression, physical deconditioning with fall. Pt presented with new RLE DVT as well as LLE cellulitis s/p fall.  Hospital Course:  1. RLE DVT 1. Noted on LE dopplers 2. Pt was continued on heparin gtt and later lovenox bridge 3. Have started coumadin, dosing per pharmacy, goal INR 2-3 4. Pt has remained medically stable thus far 2. LLE Cellulitis 1. Patch of erythema appear improved overnight 2. Pt is currently continued on clindamycin 3. Afebrile. No leukocytosis 3. Heel pressure ulcers, stage 3 1. Wound care consulted 4. Dementia 1. Seems stable at present 2. Pt is pleasantly confused 5. HTN 1. BP stable 2. Cont to monitor 6. Depression 1. Mood appears appropriate this  AM   Consultations:  WOC  Discharge Exam: Filed Vitals:   03/26/15 0606 03/26/15 1450 03/26/15 2134 03/27/15 0354  BP: 118/69 118/52 144/64 134/59  Pulse: 72 63 62 67  Temp: 98.4 F (36.9 C) 98.2 F (36.8 C) 97.9 F (36.6 C) 97.8 F (36.6 C)  TempSrc: Oral Oral Oral Oral  Resp: 18 18 20 16   Height:      SpO2: 93% 100% 97% 96%    General: Awake, in nad Cardiovascular: regular, s1, s2 Respiratory: normal resp effort, no wheezing Extremities: LLE with open sore and receding surrounding erythema, no active drainage  Discharge Instructions     Medication List    TAKE these medications        acetaminophen 650 MG CR tablet  Commonly known as:  TYLENOL  Take 650 mg by mouth every 6 (six) hours as needed for pain.     acetaminophen 500 MG tablet  Commonly known as:  TYLENOL  Take 1,000 mg by mouth every 6 (six) hours as needed for pain.     aspirin EC 81 MG tablet  Take 81 mg by mouth every morning.     benztropine 0.5 MG tablet  Commonly known as:  COGENTIN  Take 1 tablet (0.5 mg total) by mouth 2 (two) times daily.     bisacodyl 10 MG suppository  Commonly known as:  DULCOLAX  Place 10 mg rectally every 8 (eight) hours as needed for moderate constipation.     buPROPion 300 MG 24 hr tablet  Commonly known as:  WELLBUTRIN XL  Take 1 tablet (300 mg total) by mouth every morning.  clindamycin 300 MG capsule  Commonly known as:  CLEOCIN  Take 1 capsule (300 mg total) by mouth 3 (three) times daily.     clonazePAM 0.5 MG tablet  Commonly known as:  KLONOPIN  Take daily at 5 pm     enoxaparin 150 MG/ML injection  Commonly known as:  LOVENOX  Inject 0.7 mLs (105 mg total) into the skin daily.  Start taking on:  03/28/2015     feeding supplement (PRO-STAT SUGAR FREE 64) Liqd  Take 30 mLs by mouth 2 (two) times daily.     furosemide 20 MG tablet  Commonly known as:  LASIX  Take 20 mg by mouth every Monday, Wednesday, and Friday.     lamoTRIgine 100 MG  tablet  Commonly known as:  LAMICTAL  Take 1 tablet (100 mg total) by mouth 2 (two) times daily.     loratadine 10 MG tablet  Commonly known as:  CLARITIN  Take 10 mg by mouth every 12 (twelve) hours as needed for allergies.     multivitamin capsule  Take 1 capsule by mouth daily.     multivitamin Tabs tablet  Take 1 tablet by mouth daily.     omeprazole 40 MG capsule  Commonly known as:  PRILOSEC  Take 40 mg by mouth daily.     ondansetron 4 MG tablet  Commonly known as:  ZOFRAN  Take 4 mg by mouth every 8 (eight) hours as needed for nausea or vomiting.     OS-CAL ULTRA 600 MG Tabs  Take by mouth 2 (two) times daily.     oxybutynin 10 MG 24 hr tablet  Commonly known as:  DITROPAN-XL  Take 10 mg by mouth daily.     PARoxetine 40 MG tablet  Commonly known as:  PAXIL  Take 1 tablet (40 mg total) by mouth at bedtime.     polyethylene glycol packet  Commonly known as:  MIRALAX / GLYCOLAX  Take 17 g by mouth daily.     RESOURCE 2.0 Liqd  Take 120 mLs by mouth 2 (two) times daily.     risperiDONE 1 MG tablet  Commonly known as:  RISPERDAL  Take 1 tablet (1 mg total) by mouth at bedtime.     risperiDONE 0.25 MG tablet  Commonly known as:  RISPERDAL  Take 1 tablet (0.25 mg total) by mouth 3 (three) times daily. Takes at 8:00AM, 2:00PM, 5:00PM.     saccharomyces boulardii 250 MG capsule  Commonly known as:  FLORASTOR  Take 1 capsule (250 mg total) by mouth 2 (two) times daily.     senna 8.6 MG Tabs tablet  Commonly known as:  SENOKOT  Take 1 tablet by mouth 2 (two) times daily.     STOOL SOFTENER 100 MG capsule  Generic drug:  docusate sodium  Take 200 mg by mouth at bedtime.     terazosin 2 MG capsule  Commonly known as:  HYTRIN  Take 2 mg by mouth at bedtime.     Vitamin D 2000 UNITS Caps  Take 1 capsule by mouth every morning.     warfarin 5 MG tablet  Commonly known as:  COUMADIN  Take 1 tablet (5 mg total) by mouth once.       Allergies  Allergen  Reactions  . Benzodiazepines Other (See Comments)    Memory problems on Ativan, which goes away when she is off of it.   . Ciprofloxacin     Rash at site of injection  with itching  . Cephalexin Rash and Other (See Comments)    Blistering of the mouth  . Penicillins Rash      The results of significant diagnostics from this hospitalization (including imaging, microbiology, ancillary and laboratory) are listed below for reference.    Significant Diagnostic Studies: US Venous Img Lower Bilateral  03/24/2015  CLINICAL DATA:  Bilateral leg pain and swelling following fall on 03/12/2015 history of mobile ultrasound showing right leg deep venous thrombosis. EXAM: BILATERAL LOWER EXTREMITY VENOUS DOPPLER ULTRASOUND TECHNIQUE: Gray-scale sonography with graded compression, as well as color Doppler and duplex ultrasound were performed to evaluate the lower extremity deep venous systems from the level of the common femoral vein and including the common femoral, femoral, profunda femoral, popliteal and calf veins including the posterior tibial, peroneal and gastrocnemius veins when visible. The superficial great saphenous vein was also interrogated. Spectral Doppler was utilized to evaluate flow at rest and with distal augmentation maneuvers in the common femoral, femoral and popliteal veins. COMPARISON:  11/29/2013 FINDINGS: RIGHT LOWER EXTREMITY Common Femoral Vein: Nonocclusive thrombus is noted. Decreased compressibility is seen. Saphenofemoral Junction: No evidence of thrombus. Normal compressibility and flow on color Doppler imaging. Profunda Femoral Vein: Nonocclusive thrombus is noted. Decreased compressibility is seen. Femoral Vein: Nonocclusive thrombus is noted. Decreased compressibility is seen. Popliteal Vein: Nonocclusive thrombus is noted. Decreased compressibility is seen. Calf Veins: Nonocclusive thrombus is noted. Decreased compressibility is seen. Superficial Great Saphenous Vein: No evidence  of thrombus. Normal compressibility and flow on color Doppler imaging. Venous Reflux:  None. Other Findings:  None. LEFT LOWER EXTREMITY Common Femoral Vein: No evidence of thrombus. Normal compressibility, respiratory phasicity and response to augmentation. Saphenofemoral Junction: No evidence of thrombus. Normal compressibility and flow on color Doppler imaging. Profunda Femoral Vein: No evidence of thrombus. Normal compressibility and flow on color Doppler imaging. Femoral Vein: No evidence of thrombus. Normal compressibility, respiratory phasicity and response to augmentation. Popliteal Vein: No evidence of thrombus. Normal compressibility, respiratory phasicity and response to augmentation. Calf Veins: No evidence of thrombus. Normal compressibility and flow on color Doppler imaging. Superficial Great Saphenous Vein: No evidence of thrombus. Normal compressibility and flow on color Doppler imaging. Venous Reflux:  None. Other Findings:  None. IMPRESSION: No evidence of deep venous thrombosis on the left. Diffuse deep venous thrombosis from the common femoral vein into the calf veins on the right. Electronically Signed   By: Alcide Clever M.D.   On: 03/24/2015 14:22    Microbiology: Recent Results (from the past 240 hour(s))  Wound culture     Status: None (Preliminary result)   Collection Time: 03/24/15  3:40 PM  Result Value Ref Range Status   Specimen Description LEG  Final   Special Requests NONE  Final   Gram Stain   Final    RARE WBC PRESENT, PREDOMINANTLY PMN RARE SQUAMOUS EPITHELIAL CELLS PRESENT ABUNDANT GRAM POSITIVE RODS MODERATE GRAM POSITIVE COCCI IN PAIRS IN CLUSTERS    Culture   Final    ABUNDANT STAPHYLOCOCCUS AUREUS Note: RIFAMPIN AND GENTAMICIN SHOULD NOT BE USED AS SINGLE DRUGS FOR TREATMENT OF STAPH INFECTIONS. Performed at [facility]    Report Status PENDING  Incomplete     Labs: Basic Metabolic Panel:  Recent Labs Lab 03/24/15 1229 03/25/15 0555  NA  138 141  K 4.2 3.8  CL 104 106  CO2 27 29  GLUCOSE 105* 90  BUN 25* 22*  CREATININE 1.33* 1.39*  CALCIUM 9.7 9.2   Liver Function  Tests: No results for input(s): AST, ALT, ALKPHOS, BILITOT, PROT, ALBUMIN in the last 168 hours. No results for input(s): LIPASE, AMYLASE in the last 168 hours. No results for input(s): AMMONIA in the last 168 hours. CBC:  Recent Labs Lab 03/24/15 1229 03/25/15 0555 03/26/15 0614 03/27/15 0545  WBC 5.4 5.1 5.4 5.0  NEUTROABS 4.0  --   --   --   HGB 11.5* 11.2* 10.5* 10.8*  HCT 36.8 36.5 34.6* 34.9*  MCV 98.1 98.9 99.4 98.3  PLT 273 293 287 305   Cardiac Enzymes: No results for input(s): CKTOTAL, CKMB, CKMBINDEX, TROPONINI in the last 168 hours. BNP: BNP (last 3 results) No results for input(s): BNP in the last 8760 hours.  ProBNP (last 3 results) No results for input(s): PROBNP in the last 8760 hours.  CBG: No results for input(s): GLUCAP in the last 168 hours.  Signed:  Sharone Almond K  [facility] 03/27/2015, 1:56 PM

## 2015-03-27 NOTE — Consult Note (Signed)
WOC wound consult note Reason for Consult: heels and leg wound Wound type:  Left leg wound, trauma Left and right heels Stage 3 pressure injuries Pressure Ulcer POA: Yes x 2  Measurement: Left heel 1.0 x 1.0cm x 0.2cm  Right heel: 1.0cm x 2.0cm x 0.2 Left lateral/pretibial: 6cm x 5cm x 0.2 Wound bed:  Left heel/right heel; Pink, pale, minimal drainage Left pretibial 90% eschar, 10% pink at wound edges Drainage (amount, consistency, odor) left pretibial: serosanguinous, moderate amount; heel wounds minimal, serosanguinous  Periwound: intact, with erythema on the left pretibial wound, but it appears to have receded from marked spot at the time of admission. Maceration noted from the heel wounds Dressing procedure/placement/frequency: Add enzymatic debridement ointment to the left leg wound, since the wound is draining and open a bit will need to proceed with clearing the necrotic tissue.  Heels are stable but open, will use foam to protect and insulate.  Add Prevalon boots to offload the heel wounds.   Discussed POC with bedside nurse.  Re consult if needed, will not follow at this time. Thanks  Akacia Boltz [employer] RN, CWOCN [phone])

## 2015-03-27 NOTE — Care Management Note (Signed)
Case Management Note  Patient Details  Name: Koren BoundFrances Polhamus MRN: [id number] Date of Birth: 02/27/1941  Subjective/Objective:                  Pt is from [facility], admitted for DVT.   Action/Plan: Pt discharging today. Pt will return to [facility]. CSW has arranged for return to facility. Pt, family and RN aware of DC arrangements. No CM needs.   Expected Discharge Date:      03/27/2015            Expected Discharge Plan:  Skilled Nursing Facility  In-House Referral:  Clinical Social Work  Discharge planning Services  CM Consult  Post Acute Care Choice:  NA Choice offered to:  NA  DME Arranged:    DME Agency:     HH Arranged:    HH Agency:     Status of Service:  Completed, signed off  Medicare Important Message Given:  Yes Date Medicare IM Given:    Medicare IM give by:    Date Additional Medicare IM Given:    Additional Medicare Important Message give by:     If discussed at Long Length of Stay Meetings, dates discussed:    Additional Comments:  Malcolm MetroChildress, Roxi Hlavaty Demske, RN 03/27/2015, 1:22 PM

## 2015-03-27 NOTE — Progress Notes (Addendum)
ANTICOAGULATION CONSULT NOTE   Pharmacy Consult for heparin, coumadin Indication: DVT  Allergies  Allergen Reactions  . Benzodiazepines Other (See Comments)    Memory problems on Ativan, which goes away when she is off of it.   . Ciprofloxacin     Rash at site of injection with itching  . Cephalexin Rash and Other (See Comments)    Blistering of the mouth  . Penicillins Rash    Patient Measurements: Height: 5\' 6"  (167.6 cm) Weight: 253 lb (114.76 kg) IBW/kg (Calculated) : 59.3 Heparin Dosing Weight: 86.3 kg  Vital Signs: Temp: 97.8 F (36.6 C) (11/14 0354) Temp Source: Oral (11/14 0354) BP: 134/59 mmHg (11/14 0354) Pulse Rate: 67 (11/14 0354)  Labs:  Recent Labs  03/24/15 1229  03/25/15 0555  03/25/15 2148 03/26/15 0614 03/27/15 0545  HGB 11.5*  --  11.2*  --   --  10.5* 10.8*  HCT 36.8  --  36.5  --   --  34.6* 34.9*  PLT 273  --  293  --   --  287 305  APTT 30  --   --   --   --   --   --   LABPROT 14.8  --   --   --   --  14.7 16.9*  INR 1.14  --   --   --   --  1.13 1.36  HEPARINUNFRC  --   < > 0.98*  < > 0.64 0.54 0.54  CREATININE 1.33*  --  1.39*  --   --   --   --   < > = values in this interval not displayed.  Estimated Creatinine Clearance: 45 mL/min (by C-G formula based on Cr of 1.39).   Medical History: Past Medical History  Diagnosis Date  . High blood pressure   . Arthritis   . Depression   . Anxiety   . Hemorrhoids   . Hyperlipidemia   . Bipolar 1 disorder (HCC)   . Dysphagia   . Altered mental status   . Localized edema   . Weakness   . GERD (gastroesophageal reflux disease)   . Chronic kidney disease   . Dementia   . Hypoglycemia   . Thrombocytopenia (HCC)   . Osteoarthritis     Medications:  See medication history  Assessment: 74 yo lady on heparin and coumadin for DVT. This is day 3/5 overlap. Heparin level therapeutic. INR 1.36 Slight decrease in Hg but no bleeding reported. Goal of Therapy:  INR 2-3 Heparin level  0.3-0.7 units/ml Monitor platelets by anticoagulation protocol: Yes   Plan:  Cont heparin at 1050 units/hr Repeat coumadin 5 mg po today Daily HL, CBC and PT/INR Monitor for bleeding complications  Thanks for allowing pharmacy to be a part of this patient's care.  Talbert CageLora Nyal Schachter, PharmD Clinical Pharmacist  03/27/2015,8:07 AM  Addum:  Change heparin to lovenox  115 mg sq q12 hours

## 2015-03-27 NOTE — Progress Notes (Signed)
Report called to [facility]. 

## 2015-03-27 NOTE — NC FL2 (Signed)
[place] MEDICAID FL2 LEVEL OF CARE SCREENING TOOL     IDENTIFICATION  Patient Name: Amy BoundFrances Clay Birthdate: June 27, 1940 Sex: female Admission Date (Current Location): 03/24/2015  [facility] [place] [place]     Facility and Address:  [facility],  [address], [place] [id number]      Provider Number: [id number]  Attending Physician Name and Address:  Jerald KiefStephen K Chiu, MD  Relative Name and Phone Number:       Current Level of Care: Hospital Recommended Level of Care: Nursing Facility Prior Approval Number:    Date Approved/Denied:   PASRR Number: [id number]  Discharge Plan: SNF    Current Diagnoses: Patient Active Problem List   Diagnosis Date Noted  . DVT (deep venous thrombosis), unspecified laterality 03/24/2015  . Heel ulcer (HCC) 03/24/2015  . Hematoma of abdominal wall 08/20/2014  . Hypoglycemia 08/20/2014  . Morbid obesity (HCC) 08/20/2014  . Thrombocytopenia (HCC) 08/20/2014  . Altered mental status 08/19/2014  . Altered mental state 08/19/2014  . CKD (chronic kidney disease) stage 3, GFR 30-59 ml/min 08/19/2014  . Bilateral lower extremity edema 08/19/2014  . Hypothermia 08/19/2014  . Dementia 04/20/2014  . Benign essential HTN 04/20/2014  . Physical deconditioning   . Acute on chronic renal failure (HCC) 02/10/2013  . Anemia 02/10/2013  . Fall 02/09/2013  . Ingrown right big toenail 12/30/2012  . 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 Syncope 02/13/2012  . Constipation 10/01/2011  . Dysphagia 10/01/2011  . Abdominal pain 10/01/2011  . Umbilical hernia 10/01/2011  . Syncope and collapse 09/15/2011  . Major depression (HCC) 09/15/2011  . Arthritis 09/15/2011  . Rotator cuff syndrome of right shoulder 11/13/2010  . CELLULITIS/ABSCESS, LEG 03/03/2007  . HIGH BLOOD PRESSURE 02/13/2007    Orientation ACTIVITIES/SOCIAL BLADDER RESPIRATION    Self, Place  Family supportive Incontinent O2 (As needed) (1L)  BEHAVIORAL  SYMPTOMS/MOOD NEUROLOGICAL BOWEL NUTRITION STATUS  Other (Comment) (n/a)  (n/a) Incontinent Diet (Regular. See d/c summary for updates)  PHYSICIAN VISITS COMMUNICATION OF NEEDS Height & Weight Skin    Verbally 5\' 6"  (167.6 cm) 253 lbs.  Open wound to left lower leg   PU Stage 2 Dressing:  (Stage 2 to left heel and left foot with silicone dressing. )      AMBULATORY STATUS RESPIRATION     (wheelchair Clay) O2 (As needed) (1L)      Personal Care Assistance Level of Assistance  Bathing, Feeding, Dressing Bathing Assistance: Maximum assistance Feeding assistance: Maximum assistance Dressing Assistance: Maximum assistance      Functional Limitations Info  Sight, Hearing, Speech Sight Info: Adequate Hearing Info: Adequate Speech Info: Adequate       SPECIAL CARE FACTORS FREQUENCY   (n/a)                   Additional Factors Info  Psychotropic Code Status Info: Partial code Allergies Info: Benzodiazepines, Ciprofloxacin, Cephalexin, Penicillins.  Psychotropic Info: Risperdal         Current Medications (03/27/2015): Current Facility-Administered Medications  Medication Dose Route Frequency Provider Last Rate Last Dose  . 0.9 %  sodium chloride infusion   Intravenous Continuous Levie HeritageJacob J Stinson, DO 75 mL/hr at 03/27/15 [id number]    . acetaminophen (TYLENOL) tablet 650 mg  650 mg Oral Q6H PRN Levie HeritageJacob J Stinson, DO       Or  . acetaminophen (TYLENOL) suppository 650 mg  650 mg Rectal Q6H PRN Levie HeritageJacob J Stinson, DO      .  alum & mag hydroxide-simeth (MAALOX/MYLANTA) 200-200-20 MG/5ML suspension 30 mL  30 mL Oral Q6H PRN Rhona Raider Stinson, DO      . aspirin EC tablet 81 mg  81 mg Oral BH-q7a Jacob J [place], DO   81 mg at 03/27/15 0856  . benztropine (COGENTIN) tablet 0.5 mg  0.5 mg Oral BID Rhona Raider Stinson, DO   0.5 mg at 03/27/15 1610  . bisacodyl (DULCOLAX) suppository 10 mg  10 mg Rectal Q8H PRN Rhona Raider Stinson, DO      . buPROPion (WELLBUTRIN XL) 24 hr tablet 300 mg  300 mg  Oral BH-q7a Rhona Raider Stinson, DO   300 mg at 03/27/15 1000  . clindamycin (CLEOCIN) IVPB 600 mg  600 mg Intravenous 3 times per day Levie Heritage, DO   600 mg at 03/27/15 0524  . clonazePAM (KLONOPIN) tablet 0.5 mg  0.5 mg Oral BID PRN Levie Heritage, DO      . coumadin book   Does not apply Once Jerald Kief, MD      . feeding supplement (BOOST / RESOURCE BREEZE) liquid 1 Container  1 Container Oral BID BM Levie Heritage, DO   1 Container at 03/27/15 1000  . furosemide (LASIX) tablet 20 mg  20 mg Oral Q M,W,F Rhona Raider Stinson, DO   20 mg at 03/27/15 0856  . heparin ADULT infusion 100 units/mL (25000 units/250 mL)  1,050 Units/hr Intravenous Continuous Jerald Kief, MD 10.5 mL/hr at 03/27/15 0524 1,050 Units/hr at 03/27/15 0524  . lamoTRIgine (LAMICTAL) tablet 100 mg  100 mg Oral BID Rhona Raider Stinson, DO   100 mg at 03/27/15 9604  . loratadine (CLARITIN) tablet 10 mg  10 mg Oral Q12H PRN Rhona Raider Stinson, DO      . ondansetron [facility]) tablet 4 mg  4 mg Oral Q8H PRN Rhona Raider Stinson, DO      . oxybutynin (DITROPAN-XL) 24 hr tablet 10 mg  10 mg Oral Daily Rhona Raider Stinson, DO   10 mg at 03/27/15 0856  . pantoprazole (PROTONIX) EC tablet 40 mg  40 mg Oral Daily Rhona Raider Stinson, DO   40 mg at 03/27/15 0856  . PARoxetine (PAXIL) tablet 40 mg  40 mg Oral QHS Rhona Raider Stinson, DO   40 mg at 03/26/15 2147  . polyethylene glycol (MIRALAX / GLYCOLAX) packet 17 g  17 g Oral Daily Rhona Raider Stinson, DO   17 g at 03/27/15 0855  . risperiDONE (RISPERDAL) tablet 0.25 mg  0.25 mg Oral TID Rhona Raider Stinson, DO   0.25 mg at 03/27/15 0857  . risperiDONE (RISPERDAL) tablet 1 mg  1 mg Oral QHS Rhona Raider Stinson, DO   1 mg at 03/26/15 2146  . senna (SENOKOT) tablet 8.6 mg  1 tablet Oral BID Rhona Raider Stinson, DO   8.6 mg at 03/27/15 5409  . terazosin (HYTRIN) capsule 2 mg  2 mg Oral QHS Rhona Raider Stinson, DO   2 mg at 03/26/15 2146  . warfarin (COUMADIN) tablet 5 mg  5 mg Oral Once Jerald Kief, MD      . warfarin  (COUMADIN) video   Does not apply Once Jerald Kief, MD      . Warfarin - Pharmacist Dosing Inpatient   Does not apply [id number] Jerald Kief, MD       Do not use this list as official medication orders. Please verify with discharge summary.  Discharge Medications:  Medication List    ASK your doctor about these medications        acetaminophen 650 MG CR tablet  Commonly known as:  TYLENOL  Take 650 mg by mouth every 6 (six) hours as needed for pain.     acetaminophen 500 MG tablet  Commonly known as:  TYLENOL  Take 1,000 mg by mouth every 6 (six) hours as needed for pain.     aspirin EC 81 MG tablet  Take 81 mg by mouth every morning.     benztropine 0.5 MG tablet  Commonly known as:  COGENTIN  Take 1 tablet (0.5 mg total) by mouth 2 (two) times daily.     bisacodyl 10 MG suppository  Commonly known as:  DULCOLAX  Place 10 mg rectally every 8 (eight) hours as needed for moderate constipation.     buPROPion 300 MG 24 hr tablet  Commonly known as:  WELLBUTRIN XL  Take 1 tablet (300 mg total) by mouth every morning.     clonazePAM 0.5 MG tablet  Commonly known as:  KLONOPIN  Take daily at 5 pm     feeding supplement (PRO-STAT SUGAR FREE 64) Liqd  Take 30 mLs by mouth 2 (two) times daily.     furosemide 20 MG tablet  Commonly known as:  LASIX  Take 20 mg by mouth every Monday, Wednesday, and Friday.     lamoTRIgine 100 MG tablet  Commonly known as:  LAMICTAL  Take 1 tablet (100 mg total) by mouth 2 (two) times daily.     loratadine 10 MG tablet  Commonly known as:  CLARITIN  Take 10 mg by mouth every 12 (twelve) hours as needed for allergies.     multivitamin capsule  Take 1 capsule by mouth daily.     multivitamin Tabs tablet  Take 1 tablet by mouth daily.     omeprazole 40 MG capsule  Commonly known as:  PRILOSEC  Take 40 mg by mouth daily.     ondansetron 4 MG tablet  Commonly known as:  ZOFRAN  Take 4 mg by mouth every 8 (eight) hours as needed for  nausea or vomiting.     OS-CAL ULTRA 600 MG Tabs  Take by mouth 2 (two) times daily.     oxybutynin 10 MG 24 hr tablet  Commonly known as:  DITROPAN-XL  Take 10 mg by mouth daily.     PARoxetine 40 MG tablet  Commonly known as:  PAXIL  Take 1 tablet (40 mg total) by mouth at bedtime.     polyethylene glycol packet  Commonly known as:  MIRALAX / GLYCOLAX  Take 17 g by mouth daily.     RESOURCE 2.0 Liqd  Take 120 mLs by mouth 2 (two) times daily.     risperiDONE 1 MG tablet  Commonly known as:  RISPERDAL  Take 1 tablet (1 mg total) by mouth at bedtime.     risperiDONE 0.25 MG tablet  Commonly known as:  RISPERDAL  Take 1 tablet (0.25 mg total) by mouth 3 (three) times daily. Takes at 8:00AM, 2:00PM, 5:00PM.     senna 8.6 MG Tabs tablet  Commonly known as:  SENOKOT  Take 1 tablet by mouth 2 (two) times daily.     STOOL SOFTENER 100 MG capsule  Generic drug:  docusate sodium  Take 200 mg by mouth at bedtime.     terazosin 2 MG capsule  Commonly known as:  HYTRIN  Take 2 mg by mouth  at bedtime.     Vitamin D 2000 UNITS Caps  Take 1 capsule by mouth every morning.        Relevant Imaging Results:  Relevant Lab Results:  Recent Labs    Additional Information    Karn Cassis, [place] [phone]

## 2015-03-27 NOTE — Discharge Instructions (Signed)
Warfarin tablets What is this medicine? WARFARIN (WAR far in) is an anticoagulant. It is used to treat or prevent clots in the veins, arteries, lungs, or heart. This medicine may be used for other purposes; ask your health care provider or pharmacist if you have questions. What should I tell my health care provider before I take this medicine? They need to know if you have any of these conditions: -alcoholism -anemia -bleeding disorders -cancer -diabetes -heart disease -high blood pressure -history of bleeding in the gastrointestinal tract -history of stroke or other brain injury or disease -kidney or liver disease -protein C deficiency -protein S deficiency -psychosis or dementia -recent injury, recent or planned surgery or procedure -an unusual or allergic reaction to warfarin, other medicines, foods, dyes, or preservatives -pregnant or trying to get pregnant -breast-feeding How should I use this medicine? Take this medicine by mouth with a glass of water. Follow the directions on the prescription label. You can take this medicine with or without food. Take your medicine at the same time each day. Do not take it more often than directed. Do not stop taking except on your doctor's advice. Stopping this medicine may increase your risk of a blood clot. Be sure to refill your prescription before you run out of medicine. If your doctor or healthcare professional calls to change your dose, write down the dose and any other instructions. Always read the dose and instructions back to him or her to make sure you understand them. Tell your doctor or healthcare professional what strength of tablets you have on hand. Ask how many tablets you should take to equal your new dose. Write the date on the new instructions and keep them near your medicine. If you are told to stop taking your medicine until your next blood test, call your doctor or healthcare professional if you do not hear anything within 24  hours of the test to find out your new dose or when to restart your prior dose. A special MedGuide will be given to you by the pharmacist with each prescription and refill. Be sure to read this information carefully each time. Talk to your pediatrician regarding the use of this medicine in children. Special care may be needed. Overdosage: If you think you have taken too much of this medicine contact a poison control center or emergency room at once. NOTE: This medicine is only for you. Do not share this medicine with others. What if I miss a dose? It is important not to miss a dose. If you miss a dose, call your healthcare provider. Take the dose as soon as possible on the same day. If it is almost time for your next dose, take only that dose. Do not take double or extra doses to make up for a missed dose. What may interact with this medicine? Do not take this medicine with any of the following medications: -agents that prevent or dissolve blood clots -aspirin or other salicylates -danshen -dextrothyroxine -mifepristone -St. John's Wort -red yeast rice This medicine may also interact with the following medications: -acetaminophen -agents that lower cholesterol -alcohol -allopurinol -amiodarone -antibiotics or medicines for treating bacterial, fungal or viral infections -azathioprine -barbiturate medicines for inducing sleep or treating seizures -certain medicines for diabetes -certain medicines for heart rhythm problems -certain medicines for high blood pressure -chloral hydrate -cisapride -disulfiram -female hormones, including contraceptive or birth control pills -general anesthetics -herbal or dietary products like garlic, ginkgo, ginseng, green tea, or kava kava -influenza virus vaccine -female   hormones -medicines for mental depression or psychosis -medicines for some types of cancer -medicines for stomach problems -methylphenidate -NSAIDs, medicines for pain and  inflammation, like ibuprofen or naproxen -propoxyphene -quinidine, quinine -raloxifene -seizure or epilepsy medicine like carbamazepine, phenytoin, and valproic acid -steroids like cortisone and prednisone -tamoxifen -thyroid medicine -tramadol -vitamin c, vitamin e, and vitamin K -zafirlukast -zileuton This list may not describe all possible interactions. Give your health care provider a list of all the medicines, herbs, non-prescription drugs, or dietary supplements you use. Also tell them if you smoke, drink alcohol, or use illegal drugs. Some items may interact with your medicine. What should I watch for while using this medicine? Visit your doctor or health care professional for regular checks on your progress. You will need to have a blood test called a PT/INR regularly. The PT/INR blood test is done to make sure you are getting the right dose of this medicine. It is important to not miss your appointment for the blood tests. When you first start taking this medicine, these tests are done often. Once the correct dose is determined and you take your medicine properly, these tests can be done less often. Notify your doctor or health care professional and seek emergency treatment if you develop breathing problems; changes in vision; chest pain; severe, sudden headache; pain, swelling, warmth in the leg; trouble speaking; sudden numbness or weakness of the face, arm or leg. These can be signs that your condition has gotten worse. While you are taking this medicine, carry an identification card with your name, the name and dose of medicine(s) being used, and the name and phone number of your doctor or health care professional or person to contact in an emergency. Do not start taking or stop taking any medicines or over-the-counter medicines except on the advice of your doctor or health care professional. You should discuss your diet with your doctor or health care professional. Do not make major  changes in your diet. Vitamin K can affect how well this medicine works. Many foods contain vitamin K. It is important to eat a consistent amount of foods with vitamin K. Other foods with vitamin K that you should eat in consistent amounts are asparagus, basil, beef or pork liver, black eyed peas, broccoli, brussel sprouts, cabbage, chickpeas, cucumber with peel, green onions, green tea, okra, parsley, peas, thyme, and green leafy vegetables like beet greens, collard greens, endive, kale, mustard greens, spinach, turnip greens, watercress, or certain lettuces like green leaf or romaine. This medicine can cause birth defects or bleeding in an unborn child. Women of childbearing age should use effective birth control while taking this medicine. If a woman becomes pregnant while taking this medicine, she should discuss the potential risks and her options with her health care professional. Avoid sports and activities that might cause injury while you are using this medicine. Severe falls or injuries can cause unseen bleeding. Be careful when using sharp tools or knives. Consider using an electric razor. Take special care brushing or flossing your teeth. Report any injuries, bruising, or red spots on the skin to your doctor or health care professional. If you have an illness that causes vomiting, diarrhea, or fever for more than a few days, contact your doctor. Also check with your doctor if you are unable to eat for several days. These problems can change the effect of this medicine. Even after you stop taking this medicine, it takes several days before your body recovers its normal ability to clot   blood. Ask your doctor or health care professional how long you need to be careful. If you are going to have surgery or dental work, tell your doctor or health care professional that you have been taking this medicine. What side effects may I notice from receiving this medicine? Side effects that you should report to  your doctor or health care professional as soon as possible: -back pain -chills -dizziness -fever -heavy menstrual bleeding or vaginal bleeding -painful, blue, or purple toes -painful, prolonged erection -signs and symptoms of bleeding such as bloody or black, tarry stools; red or dark-brown urine; spitting up blood or brown material that looks like coffee grounds; red spots on the skin; unusual bruising or bleeding from the eye, gums, or nose-skin rash, itching or skin damage -stomach pain -unusually weak or tired -yellowing of skin or eyes Side effects that usually do not require medical attention (report to your doctor or health care professional if they continue or are bothersome): -diarrhea -hair loss This list may not describe all possible side effects. Call your doctor for medical advice about side effects. You may report side effects to FDA at 1-800-FDA-1088. Where should I keep my medicine? Keep out of the reach of children. Store at room temperature between 15 and 30 degrees C (59 and 86 degrees F). Protect from light. Throw away any unused medicine after the expiration date. Do not flush down the toilet. NOTE: This sheet is a summary. It may not cover all possible information. If you have questions about this medicine, talk to your doctor, pharmacist, or health care provider.    2016, Elsevier/Gold Standard. (2012-11-18 12:17:56)  

## 2015-03-27 NOTE — Care Management Important Message (Signed)
Important Message  Patient Details  Name: Koren BoundFrances Luzader MRN: [id number] Date of Birth: 04-03-1941   Medicare Important Message Given:  Yes    Malcolm MetroChildress, Rylei Codispoti Demske, RN 03/27/2015, 1:18 PM

## 2015-03-28 DIAGNOSIS — L89603 Pressure ulcer of unspecified heel, stage 3: Secondary | ICD-10-CM | POA: Diagnosis not present

## 2015-03-28 DIAGNOSIS — L0390 Cellulitis, unspecified: Secondary | ICD-10-CM | POA: Diagnosis not present

## 2015-03-28 DIAGNOSIS — I8290 Acute embolism and thrombosis of unspecified vein: Secondary | ICD-10-CM | POA: Diagnosis not present

## 2015-03-28 DIAGNOSIS — T8130XD Disruption of wound, unspecified, subsequent encounter: Secondary | ICD-10-CM | POA: Diagnosis not present

## 2015-03-28 DIAGNOSIS — I10 Essential (primary) hypertension: Secondary | ICD-10-CM | POA: Diagnosis not present

## 2015-03-28 LAB — WOUND CULTURE

## 2015-03-29 DIAGNOSIS — I8290 Acute embolism and thrombosis of unspecified vein: Secondary | ICD-10-CM | POA: Diagnosis not present

## 2015-03-29 DIAGNOSIS — L0390 Cellulitis, unspecified: Secondary | ICD-10-CM | POA: Diagnosis not present

## 2015-03-29 DIAGNOSIS — I10 Essential (primary) hypertension: Secondary | ICD-10-CM | POA: Diagnosis not present

## 2015-03-29 DIAGNOSIS — L89603 Pressure ulcer of unspecified heel, stage 3: Secondary | ICD-10-CM | POA: Diagnosis not present

## 2015-03-29 DIAGNOSIS — L97829 Non-pressure chronic ulcer of other part of left lower leg with unspecified severity: Secondary | ICD-10-CM | POA: Diagnosis not present

## 2015-03-29 DIAGNOSIS — T8130XD Disruption of wound, unspecified, subsequent encounter: Secondary | ICD-10-CM | POA: Diagnosis not present

## 2015-03-30 DIAGNOSIS — I8290 Acute embolism and thrombosis of unspecified vein: Secondary | ICD-10-CM | POA: Diagnosis not present

## 2015-03-30 DIAGNOSIS — L0390 Cellulitis, unspecified: Secondary | ICD-10-CM | POA: Diagnosis not present

## 2015-03-30 DIAGNOSIS — I10 Essential (primary) hypertension: Secondary | ICD-10-CM | POA: Diagnosis not present

## 2015-03-31 DIAGNOSIS — L0390 Cellulitis, unspecified: Secondary | ICD-10-CM | POA: Diagnosis not present

## 2015-03-31 DIAGNOSIS — I8290 Acute embolism and thrombosis of unspecified vein: Secondary | ICD-10-CM | POA: Diagnosis not present

## 2015-03-31 DIAGNOSIS — I10 Essential (primary) hypertension: Secondary | ICD-10-CM | POA: Diagnosis not present

## 2015-04-01 DIAGNOSIS — I8290 Acute embolism and thrombosis of unspecified vein: Secondary | ICD-10-CM | POA: Diagnosis not present

## 2015-04-01 DIAGNOSIS — L0390 Cellulitis, unspecified: Secondary | ICD-10-CM | POA: Diagnosis not present

## 2015-04-01 DIAGNOSIS — I10 Essential (primary) hypertension: Secondary | ICD-10-CM | POA: Diagnosis not present

## 2015-04-02 DIAGNOSIS — I8290 Acute embolism and thrombosis of unspecified vein: Secondary | ICD-10-CM | POA: Diagnosis not present

## 2015-04-02 DIAGNOSIS — I10 Essential (primary) hypertension: Secondary | ICD-10-CM | POA: Diagnosis not present

## 2015-04-02 DIAGNOSIS — L0390 Cellulitis, unspecified: Secondary | ICD-10-CM | POA: Diagnosis not present

## 2015-04-03 DIAGNOSIS — L0390 Cellulitis, unspecified: Secondary | ICD-10-CM | POA: Diagnosis not present

## 2015-04-03 DIAGNOSIS — F341 Dysthymic disorder: Secondary | ICD-10-CM | POA: Diagnosis not present

## 2015-04-03 DIAGNOSIS — I8290 Acute embolism and thrombosis of unspecified vein: Secondary | ICD-10-CM | POA: Diagnosis not present

## 2015-04-03 DIAGNOSIS — I10 Essential (primary) hypertension: Secondary | ICD-10-CM | POA: Diagnosis not present

## 2015-04-04 DIAGNOSIS — I8290 Acute embolism and thrombosis of unspecified vein: Secondary | ICD-10-CM | POA: Diagnosis not present

## 2015-04-06 DIAGNOSIS — T8130XD Disruption of wound, unspecified, subsequent encounter: Secondary | ICD-10-CM | POA: Diagnosis not present

## 2015-04-06 DIAGNOSIS — I8290 Acute embolism and thrombosis of unspecified vein: Secondary | ICD-10-CM | POA: Diagnosis not present

## 2015-04-08 DIAGNOSIS — I8290 Acute embolism and thrombosis of unspecified vein: Secondary | ICD-10-CM | POA: Diagnosis not present

## 2015-04-08 DIAGNOSIS — T8130XD Disruption of wound, unspecified, subsequent encounter: Secondary | ICD-10-CM | POA: Diagnosis not present

## 2015-04-09 DIAGNOSIS — I8290 Acute embolism and thrombosis of unspecified vein: Secondary | ICD-10-CM | POA: Diagnosis not present

## 2015-04-09 DIAGNOSIS — T8130XD Disruption of wound, unspecified, subsequent encounter: Secondary | ICD-10-CM | POA: Diagnosis not present

## 2015-04-10 DIAGNOSIS — T8130XD Disruption of wound, unspecified, subsequent encounter: Secondary | ICD-10-CM | POA: Diagnosis not present

## 2015-04-10 DIAGNOSIS — I8290 Acute embolism and thrombosis of unspecified vein: Secondary | ICD-10-CM | POA: Diagnosis not present

## 2015-04-11 DIAGNOSIS — N183 Chronic kidney disease, stage 3 (moderate): Secondary | ICD-10-CM | POA: Diagnosis not present

## 2015-04-11 DIAGNOSIS — T8130XD Disruption of wound, unspecified, subsequent encounter: Secondary | ICD-10-CM | POA: Diagnosis not present

## 2015-04-11 DIAGNOSIS — I8290 Acute embolism and thrombosis of unspecified vein: Secondary | ICD-10-CM | POA: Diagnosis not present

## 2015-04-12 DIAGNOSIS — T8130XD Disruption of wound, unspecified, subsequent encounter: Secondary | ICD-10-CM | POA: Diagnosis not present

## 2015-04-12 DIAGNOSIS — M6281 Muscle weakness (generalized): Secondary | ICD-10-CM | POA: Diagnosis not present

## 2015-04-12 DIAGNOSIS — L97829 Non-pressure chronic ulcer of other part of left lower leg with unspecified severity: Secondary | ICD-10-CM | POA: Diagnosis not present

## 2015-04-12 DIAGNOSIS — I8290 Acute embolism and thrombosis of unspecified vein: Secondary | ICD-10-CM | POA: Diagnosis not present

## 2015-04-12 DIAGNOSIS — E162 Hypoglycemia, unspecified: Secondary | ICD-10-CM | POA: Diagnosis not present

## 2015-04-13 DIAGNOSIS — E162 Hypoglycemia, unspecified: Secondary | ICD-10-CM | POA: Diagnosis not present

## 2015-04-13 DIAGNOSIS — M6281 Muscle weakness (generalized): Secondary | ICD-10-CM | POA: Diagnosis not present

## 2015-04-13 DIAGNOSIS — R627 Adult failure to thrive: Secondary | ICD-10-CM | POA: Diagnosis not present

## 2015-04-13 DIAGNOSIS — R197 Diarrhea, unspecified: Secondary | ICD-10-CM | POA: Diagnosis not present

## 2015-04-13 DIAGNOSIS — I8290 Acute embolism and thrombosis of unspecified vein: Secondary | ICD-10-CM | POA: Diagnosis not present

## 2015-04-14 DIAGNOSIS — R627 Adult failure to thrive: Secondary | ICD-10-CM | POA: Diagnosis not present

## 2015-04-14 DIAGNOSIS — I8290 Acute embolism and thrombosis of unspecified vein: Secondary | ICD-10-CM | POA: Diagnosis not present

## 2015-04-17 DIAGNOSIS — N390 Urinary tract infection, site not specified: Secondary | ICD-10-CM | POA: Diagnosis not present

## 2015-04-17 DIAGNOSIS — I8290 Acute embolism and thrombosis of unspecified vein: Secondary | ICD-10-CM | POA: Diagnosis not present

## 2015-04-17 DIAGNOSIS — R627 Adult failure to thrive: Secondary | ICD-10-CM | POA: Diagnosis not present

## 2015-04-17 DIAGNOSIS — R197 Diarrhea, unspecified: Secondary | ICD-10-CM | POA: Diagnosis not present

## 2015-04-19 DIAGNOSIS — L89623 Pressure ulcer of left heel, stage 3: Secondary | ICD-10-CM | POA: Diagnosis not present

## 2015-04-20 DIAGNOSIS — R627 Adult failure to thrive: Secondary | ICD-10-CM | POA: Diagnosis not present

## 2015-04-20 DIAGNOSIS — T8130XD Disruption of wound, unspecified, subsequent encounter: Secondary | ICD-10-CM | POA: Diagnosis not present

## 2015-04-20 DIAGNOSIS — I8290 Acute embolism and thrombosis of unspecified vein: Secondary | ICD-10-CM | POA: Diagnosis not present

## 2015-04-20 DIAGNOSIS — M6281 Muscle weakness (generalized): Secondary | ICD-10-CM | POA: Diagnosis not present

## 2015-04-24 DIAGNOSIS — N183 Chronic kidney disease, stage 3 (moderate): Secondary | ICD-10-CM | POA: Diagnosis not present

## 2015-04-24 DIAGNOSIS — N390 Urinary tract infection, site not specified: Secondary | ICD-10-CM | POA: Diagnosis not present

## 2015-04-24 DIAGNOSIS — I8290 Acute embolism and thrombosis of unspecified vein: Secondary | ICD-10-CM | POA: Diagnosis not present

## 2015-04-26 DIAGNOSIS — T814XXD Infection following a procedure, subsequent encounter: Secondary | ICD-10-CM | POA: Diagnosis not present

## 2015-04-26 DIAGNOSIS — R627 Adult failure to thrive: Secondary | ICD-10-CM | POA: Diagnosis not present

## 2015-04-26 DIAGNOSIS — I8290 Acute embolism and thrombosis of unspecified vein: Secondary | ICD-10-CM | POA: Diagnosis not present

## 2015-04-26 DIAGNOSIS — N390 Urinary tract infection, site not specified: Secondary | ICD-10-CM | POA: Diagnosis not present

## 2015-04-26 DIAGNOSIS — L89623 Pressure ulcer of left heel, stage 3: Secondary | ICD-10-CM | POA: Diagnosis not present

## 2015-05-01 DIAGNOSIS — N390 Urinary tract infection, site not specified: Secondary | ICD-10-CM | POA: Diagnosis not present

## 2015-05-01 DIAGNOSIS — N183 Chronic kidney disease, stage 3 (moderate): Secondary | ICD-10-CM | POA: Diagnosis not present

## 2015-05-01 DIAGNOSIS — R627 Adult failure to thrive: Secondary | ICD-10-CM | POA: Diagnosis not present

## 2015-05-01 DIAGNOSIS — I8290 Acute embolism and thrombosis of unspecified vein: Secondary | ICD-10-CM | POA: Diagnosis not present

## 2015-05-01 DIAGNOSIS — T814XXD Infection following a procedure, subsequent encounter: Secondary | ICD-10-CM | POA: Diagnosis not present

## 2015-05-03 DIAGNOSIS — L89623 Pressure ulcer of left heel, stage 3: Secondary | ICD-10-CM | POA: Diagnosis not present

## 2015-05-04 DIAGNOSIS — I8290 Acute embolism and thrombosis of unspecified vein: Secondary | ICD-10-CM | POA: Diagnosis not present

## 2015-05-04 DIAGNOSIS — T814XXD Infection following a procedure, subsequent encounter: Secondary | ICD-10-CM | POA: Diagnosis not present

## 2015-05-04 DIAGNOSIS — N183 Chronic kidney disease, stage 3 (moderate): Secondary | ICD-10-CM | POA: Diagnosis not present

## 2015-05-04 DIAGNOSIS — N390 Urinary tract infection, site not specified: Secondary | ICD-10-CM | POA: Diagnosis not present

## 2015-05-10 DIAGNOSIS — L89623 Pressure ulcer of left heel, stage 3: Secondary | ICD-10-CM | POA: Diagnosis not present

## 2015-05-11 DIAGNOSIS — R627 Adult failure to thrive: Secondary | ICD-10-CM | POA: Diagnosis not present

## 2015-05-11 DIAGNOSIS — N390 Urinary tract infection, site not specified: Secondary | ICD-10-CM | POA: Diagnosis not present

## 2015-05-11 DIAGNOSIS — T814XXD Infection following a procedure, subsequent encounter: Secondary | ICD-10-CM | POA: Diagnosis not present

## 2015-05-11 DIAGNOSIS — I8290 Acute embolism and thrombosis of unspecified vein: Secondary | ICD-10-CM | POA: Diagnosis not present

## 2015-05-17 DIAGNOSIS — L97829 Non-pressure chronic ulcer of other part of left lower leg with unspecified severity: Secondary | ICD-10-CM | POA: Diagnosis not present

## 2015-05-17 DIAGNOSIS — L89623 Pressure ulcer of left heel, stage 3: Secondary | ICD-10-CM | POA: Diagnosis not present

## 2015-05-19 DIAGNOSIS — I4891 Unspecified atrial fibrillation: Secondary | ICD-10-CM | POA: Diagnosis not present

## 2015-05-19 DIAGNOSIS — Z7901 Long term (current) use of anticoagulants: Secondary | ICD-10-CM | POA: Diagnosis not present

## 2015-05-20 DIAGNOSIS — L02416 Cutaneous abscess of left lower limb: Secondary | ICD-10-CM | POA: Diagnosis not present

## 2015-05-20 DIAGNOSIS — M6281 Muscle weakness (generalized): Secondary | ICD-10-CM | POA: Diagnosis not present

## 2015-05-20 DIAGNOSIS — R1310 Dysphagia, unspecified: Secondary | ICD-10-CM | POA: Diagnosis not present

## 2015-05-21 DIAGNOSIS — M6281 Muscle weakness (generalized): Secondary | ICD-10-CM | POA: Diagnosis not present

## 2015-05-21 DIAGNOSIS — L02416 Cutaneous abscess of left lower limb: Secondary | ICD-10-CM | POA: Diagnosis not present

## 2015-05-21 DIAGNOSIS — R1310 Dysphagia, unspecified: Secondary | ICD-10-CM | POA: Diagnosis not present

## 2015-05-23 DIAGNOSIS — E785 Hyperlipidemia, unspecified: Secondary | ICD-10-CM | POA: Diagnosis not present

## 2015-05-23 DIAGNOSIS — L02416 Cutaneous abscess of left lower limb: Secondary | ICD-10-CM | POA: Diagnosis not present

## 2015-05-23 DIAGNOSIS — I82421 Acute embolism and thrombosis of right iliac vein: Secondary | ICD-10-CM | POA: Diagnosis not present

## 2015-05-23 DIAGNOSIS — R0602 Shortness of breath: Secondary | ICD-10-CM | POA: Diagnosis not present

## 2015-05-23 DIAGNOSIS — M6281 Muscle weakness (generalized): Secondary | ICD-10-CM | POA: Diagnosis not present

## 2015-05-23 DIAGNOSIS — D72829 Elevated white blood cell count, unspecified: Secondary | ICD-10-CM | POA: Diagnosis not present

## 2015-05-23 DIAGNOSIS — R1310 Dysphagia, unspecified: Secondary | ICD-10-CM | POA: Diagnosis not present

## 2015-05-23 DIAGNOSIS — D649 Anemia, unspecified: Secondary | ICD-10-CM | POA: Diagnosis not present

## 2015-05-24 DIAGNOSIS — I82421 Acute embolism and thrombosis of right iliac vein: Secondary | ICD-10-CM | POA: Diagnosis not present

## 2015-05-24 DIAGNOSIS — R700 Elevated erythrocyte sedimentation rate: Secondary | ICD-10-CM | POA: Diagnosis not present

## 2015-05-24 DIAGNOSIS — I82409 Acute embolism and thrombosis of unspecified deep veins of unspecified lower extremity: Secondary | ICD-10-CM | POA: Diagnosis not present

## 2015-05-24 DIAGNOSIS — N390 Urinary tract infection, site not specified: Secondary | ICD-10-CM | POA: Diagnosis not present

## 2015-05-24 DIAGNOSIS — I4891 Unspecified atrial fibrillation: Secondary | ICD-10-CM | POA: Diagnosis not present

## 2015-05-24 DIAGNOSIS — M6281 Muscle weakness (generalized): Secondary | ICD-10-CM | POA: Diagnosis not present

## 2015-05-24 DIAGNOSIS — N183 Chronic kidney disease, stage 3 (moderate): Secondary | ICD-10-CM | POA: Diagnosis not present

## 2015-05-24 DIAGNOSIS — L89623 Pressure ulcer of left heel, stage 3: Secondary | ICD-10-CM | POA: Diagnosis not present

## 2015-05-24 DIAGNOSIS — L02416 Cutaneous abscess of left lower limb: Secondary | ICD-10-CM | POA: Diagnosis not present

## 2015-05-24 DIAGNOSIS — R1310 Dysphagia, unspecified: Secondary | ICD-10-CM | POA: Diagnosis not present

## 2015-05-24 DIAGNOSIS — R627 Adult failure to thrive: Secondary | ICD-10-CM | POA: Diagnosis not present

## 2015-05-25 DIAGNOSIS — L02416 Cutaneous abscess of left lower limb: Secondary | ICD-10-CM | POA: Diagnosis not present

## 2015-05-25 DIAGNOSIS — M6281 Muscle weakness (generalized): Secondary | ICD-10-CM | POA: Diagnosis not present

## 2015-05-25 DIAGNOSIS — R1310 Dysphagia, unspecified: Secondary | ICD-10-CM | POA: Diagnosis not present

## 2015-05-26 DIAGNOSIS — L02416 Cutaneous abscess of left lower limb: Secondary | ICD-10-CM | POA: Diagnosis not present

## 2015-05-26 DIAGNOSIS — R1310 Dysphagia, unspecified: Secondary | ICD-10-CM | POA: Diagnosis not present

## 2015-05-26 DIAGNOSIS — M6281 Muscle weakness (generalized): Secondary | ICD-10-CM | POA: Diagnosis not present

## 2015-05-28 DIAGNOSIS — Z7901 Long term (current) use of anticoagulants: Secondary | ICD-10-CM | POA: Diagnosis not present

## 2015-05-28 DIAGNOSIS — N390 Urinary tract infection, site not specified: Secondary | ICD-10-CM | POA: Diagnosis not present

## 2015-05-29 DIAGNOSIS — N390 Urinary tract infection, site not specified: Secondary | ICD-10-CM | POA: Diagnosis not present

## 2015-05-29 DIAGNOSIS — R1310 Dysphagia, unspecified: Secondary | ICD-10-CM | POA: Diagnosis not present

## 2015-05-29 DIAGNOSIS — Z79899 Other long term (current) drug therapy: Secondary | ICD-10-CM | POA: Diagnosis not present

## 2015-05-29 DIAGNOSIS — L02416 Cutaneous abscess of left lower limb: Secondary | ICD-10-CM | POA: Diagnosis not present

## 2015-05-29 DIAGNOSIS — M6281 Muscle weakness (generalized): Secondary | ICD-10-CM | POA: Diagnosis not present

## 2015-05-30 ENCOUNTER — Ambulatory Visit (INDEPENDENT_AMBULATORY_CARE_PROVIDER_SITE_OTHER): Payer: Medicare Other | Admitting: Psychiatry

## 2015-05-30 ENCOUNTER — Encounter (HOSPITAL_COMMUNITY): Payer: Self-pay | Admitting: Psychiatry

## 2015-05-30 VITALS — BP 96/69 | HR 77 | Ht 66.0 in

## 2015-05-30 DIAGNOSIS — D72829 Elevated white blood cell count, unspecified: Secondary | ICD-10-CM | POA: Diagnosis not present

## 2015-05-30 DIAGNOSIS — M79674 Pain in right toe(s): Secondary | ICD-10-CM | POA: Diagnosis not present

## 2015-05-30 DIAGNOSIS — R0682 Tachypnea, not elsewhere classified: Secondary | ICD-10-CM | POA: Diagnosis not present

## 2015-05-30 DIAGNOSIS — M79675 Pain in left toe(s): Secondary | ICD-10-CM | POA: Diagnosis not present

## 2015-05-30 DIAGNOSIS — B351 Tinea unguium: Secondary | ICD-10-CM | POA: Diagnosis not present

## 2015-05-30 DIAGNOSIS — L02416 Cutaneous abscess of left lower limb: Secondary | ICD-10-CM | POA: Diagnosis not present

## 2015-05-30 DIAGNOSIS — R1310 Dysphagia, unspecified: Secondary | ICD-10-CM | POA: Diagnosis not present

## 2015-05-30 DIAGNOSIS — F331 Major depressive disorder, recurrent, moderate: Secondary | ICD-10-CM | POA: Diagnosis not present

## 2015-05-30 DIAGNOSIS — F489 Nonpsychotic mental disorder, unspecified: Secondary | ICD-10-CM

## 2015-05-30 DIAGNOSIS — F5105 Insomnia due to other mental disorder: Secondary | ICD-10-CM | POA: Diagnosis not present

## 2015-05-30 DIAGNOSIS — M6281 Muscle weakness (generalized): Secondary | ICD-10-CM | POA: Diagnosis not present

## 2015-05-30 DIAGNOSIS — D696 Thrombocytopenia, unspecified: Secondary | ICD-10-CM | POA: Diagnosis not present

## 2015-05-30 MED ORDER — CLONAZEPAM 0.5 MG PO TABS: ORAL_TABLET | ORAL | Status: DC

## 2015-05-30 MED ORDER — RISPERIDONE 1 MG PO TABS: 1.0000 mg | ORAL_TABLET | Freq: Every day | ORAL | Status: DC

## 2015-05-30 MED ORDER — RISPERIDONE 0.25 MG PO TABS: 0.2500 mg | ORAL_TABLET | Freq: Three times a day (TID) | ORAL | Status: DC

## 2015-05-30 MED ORDER — BENZTROPINE MESYLATE 0.5 MG PO TABS: 0.5000 mg | ORAL_TABLET | Freq: Two times a day (BID) | ORAL | Status: DC

## 2015-05-30 MED ORDER — DONEPEZIL HCL 5 MG PO TABS: 5.0000 mg | ORAL_TABLET | Freq: Every day | ORAL | Status: DC

## 2015-05-30 MED ORDER — BUPROPION HCL ER (XL) 300 MG PO TB24: 300.0000 mg | ORAL_TABLET | ORAL | Status: DC

## 2015-05-30 MED ORDER — PAROXETINE HCL 40 MG PO TABS: 40.0000 mg | ORAL_TABLET | Freq: Every day | ORAL | Status: DC

## 2015-05-30 MED ORDER — LAMOTRIGINE 100 MG PO TABS: 100.0000 mg | ORAL_TABLET | Freq: Two times a day (BID) | ORAL | Status: DC

## 2015-05-30 NOTE — Progress Notes (Signed)
Patient ID: Amy Clay, female   DOB: 07-Feb-1941, 75 y.o.   MRN: [id number] Patient ID: Amy Clay, female   DOB: 1940/11/06, 75 y.o.   MRN: [id number] Patient ID: Amy Clay, female   DOB: 01-Jul-1940, 75 y.o.   MRN: [id number] Patient ID: Amy Clay, female   DOB: [birth year], 75 y.o.   MRN: [id number] Patient ID: Amy Clay, female   DOB: 02-23-1941, 75 y.o.   MRN: [id number] Patient ID: Amy Clay, female   DOB: 07/26/1940, 75 y.o.   MRN: [id number] Patient ID: Amy Clay, female   DOB: November 12, 1940, 75 y.o.   MRN: [id number] Patient ID: Amy Clay, female   DOB: [birth year], 75 y.o.   MRN: [id number] Patient ID: Amy Clay, female   DOB: [birth year], 75 y.o.   MRN: [id number] Patient ID: Amy Clay, female   DOB: 1940-06-08, 75 y.o.   MRN: [id number] Patient ID: Amy Clay, female   DOB: 1941-05-11, 75 y.o.   MRN: [id number] Patient ID: Amy Clay, female   DOB: [birth year], 75 y.o.   MRN: [id number] Patient ID: Amy Clay, female   DOB: [birth year], 75 y.o.   MRN: [id number] [facility] [id number] Progress Note Amy Clay MRN: [id number] DOB: March 21, 1941 Age: 75 y.o.  Date: 05/30/2015  Chief Complaint  Patient presents with  . Depression  . Anxiety  . Agitation  . Memory Loss  . Follow-up   History of presenting illness Patient is 75 year old Caucasian female who came with her sister for her followup appointment. She lives in the [facility]. She and her sister had been living together in the family farm outside of [place]. Neither one has ever been married or have had children. The patient worked until the early 80s when she began to have difficulties with her nerves.  Currently the patient is still struggling with her mental and physical health. She was admitted last year to a psychiatric hospital in [place] because she was anxious nervous and depressed. Last time Dr. Lolly Mustache increased her Risperdal which is helped  somewhat. She can't walk and it's not clear why much of it seems to be anxiety based and she seems to have a fear of falling. She's also recently developed possible renal failure and is can be referred to a nephrologist. She was on lithium for a number of years. We do not have her  recent records from her primary care physician who is outside of [place]  The patient returns after 3 months. She is here with the nursing assistant from [facility] nursing home. Unfortunately this person doesn't know the patient very well. From reading the notes I gathered that she had a DVT in November and was hospitalized and is now on Coumadin. She's also had recurrent UTIs and URIs. She is oriented only to person but does remember her sister's name but doesn't remember who I am or why she is here. She becomes tearful at times and is fearful that she is going to fall off the wheelchair. She denies auditory or visual hallucinations and she was able to smile little bit at. Overall her cognitive function has declined and she is not on any medication for dementia  Past psychiatric history Patient is seeing psychiatrist since 1997 in this office.  Her last psychiatric admission was at [facility] after having suicidal thoughts.  She denies any history of suicidal attempt however endorse history of hopeless feeling.  She is diagnosed with major depressive disorder. She she was very stable on lithium until her creatinine  went up and lithium was discontinued.  We had recently tried Lamictal and the dose has been increased recently.  In the past we had tried BuSpar which was discontinued at hospital.  She has another psychiatric inpatient treatment in 90s. She admitted history of mania, depression and psychosis.  Social history Patient lives in a nursing home.  She has no children.  Her husband died in nursing home.  Patient has no other family.  Medical history Patient was recently admitted due to dehydration and  UTI.  Patient has history of arthritis blood pressure and chronic pain. Her primary care physician is Dr. Phillips Odor at [facility].  She takes medication for her blood pressure and arthritis.  She had history of jerking movements, fall and generalized pain. Her last blood work was done on 02/14/2012 which shows anemia, her creatinine was 1.33.  She was given antibiotic result UTI.  Her WBC count was 10.4.  Family History family history includes Anxiety disorder in her paternal aunt and paternal uncle; Bipolar disorder in her cousin. There is no history of Colon cancer.  ROS   Mental status examination Patient is casually dressed and fairly groomed. She is in a wheelchair   She described her mood as ok.Her voice is high-pitched and soft.  .Her affect is directly tearful and then she smiles she cried cried a little bit when talking about her fear of falling out of the wheelchair  Her attention and concentration are poor today  She said very little today  She doesn't have auditory or visual hallucination.  There were no paranoia or delusion obsession present at this time.  Her fund of knowledge is okay.  There are no tremors and shakes in her hand.  She's alert but only oriented to person  Her insight judgment are poor and impulse control is okay.  Lab Results:  Results for orders placed or performed during the hospital encounter of 03/24/15 (from the past 8736 hour(s))  CBC with Differential/Platelet   Collection Time: 03/24/15 12:29 PM  Result Value Ref Range   WBC 5.4 4.0 - 10.5 K/uL   RBC 3.75 (L) 3.87 - 5.11 MIL/uL   Hemoglobin 11.5 (L) 12.0 - 15.0 g/dL   HCT 16.1 09.6 - 04.5 %   MCV 98.1 78.0 - 100.0 fL   MCH 30.7 26.0 - 34.0 pg   MCHC 31.3 30.0 - 36.0 g/dL   RDW 40.9 81.1 - 91.4 %   Platelets 273 150 - 400 K/uL   Neutrophils Relative % 74 %   Neutro Abs 4.0 1.7 - 7.7 K/uL   Lymphocytes Relative 15 %   Lymphs Abs 0.8 0.7 - 4.0 K/uL   Monocytes Relative 8 %   Monocytes  Absolute 0.4 0.1 - 1.0 K/uL   Eosinophils Relative 3 %   Eosinophils Absolute 0.2 0.0 - 0.7 K/uL   Basophils Relative 0 %   Basophils Absolute 0.0 0.0 - 0.1 K/uL  Basic metabolic panel   Collection Time: 03/24/15 12:29 PM  Result Value Ref Range   Sodium 138 135 - 145 mmol/L   Potassium 4.2 3.5 - 5.1 mmol/L   Chloride 104 101 - 111 mmol/L   CO2 27 22 - 32 mmol/L   Glucose, Bld 105 (H) 65 - 99 mg/dL   BUN 25 (H) 6 - 20 mg/dL   Creatinine, Ser 7.82 (H) 0.44 - 1.00 mg/dL   Calcium 9.7 8.9 - 95.6 mg/dL   GFR calc non Af Amer 38 (L) >60 mL/min  GFR calc Af Amer 44 (L) >60 mL/min   Anion gap 7 5 - 15  Protime-INR   Collection Time: 03/24/15 12:29 PM  Result Value Ref Range   Prothrombin Time 14.8 11.6 - 15.2 seconds   INR 1.14 0.00 - 1.49  APTT   Collection Time: 03/24/15 12:29 PM  Result Value Ref Range   aPTT 30 24 - 37 seconds  Wound culture   Collection Time: 03/24/15  3:40 PM  Result Value Ref Range   Specimen Description LEG    Special Requests NONE    Gram Stain      RARE WBC PRESENT, PREDOMINANTLY PMN RARE SQUAMOUS EPITHELIAL CELLS PRESENT ABUNDANT GRAM POSITIVE RODS MODERATE GRAM POSITIVE COCCI IN PAIRS IN CLUSTERS    Culture      ABUNDANT METHICILLIN RESISTANT STAPHYLOCOCCUS AUREUS Note: RIFAMPIN AND GENTAMICIN SHOULD NOT BE USED AS SINGLE DRUGS FOR TREATMENT OF STAPH INFECTIONS. This organism DOES NOT demonstrate inducible Clindamycin resistance in vitro. Performed at [facility]    Report Status 03/28/2015 FINAL    Organism ID, Bacteria METHICILLIN RESISTANT STAPHYLOCOCCUS AUREUS       Susceptibility   Methicillin resistant staphylococcus aureus - MIC*    CLINDAMYCIN <=0.25 SENSITIVE Sensitive     ERYTHROMYCIN >=8 RESISTANT Resistant     GENTAMICIN <=0.5 SENSITIVE Sensitive     LEVOFLOXACIN >=8 RESISTANT Resistant     OXACILLIN >=4 RESISTANT Resistant     RIFAMPIN <=0.5 SENSITIVE Sensitive     TRIMETH/SULFA <=10 SENSITIVE Sensitive      VANCOMYCIN <=0.5 SENSITIVE Sensitive     TETRACYCLINE <=1 SENSITIVE Sensitive     * ABUNDANT METHICILLIN RESISTANT STAPHYLOCOCCUS AUREUS  Heparin level (unfractionated)   Collection Time: 03/24/15  8:55 PM  Result Value Ref Range   Heparin Unfractionated 0.69 0.30 - 0.70 IU/mL  Heparin level (unfractionated)   Collection Time: 03/25/15  5:55 AM  Result Value Ref Range   Heparin Unfractionated 0.98 (H) 0.30 - 0.70 IU/mL  CBC   Collection Time: 03/25/15  5:55 AM  Result Value Ref Range   WBC 5.1 4.0 - 10.5 K/uL   RBC 3.69 (L) 3.87 - 5.11 MIL/uL   Hemoglobin 11.2 (L) 12.0 - 15.0 g/dL   HCT 16.1 09.6 - 04.5 %   MCV 98.9 78.0 - 100.0 fL   MCH 30.4 26.0 - 34.0 pg   MCHC 30.7 30.0 - 36.0 g/dL   RDW 40.9 81.1 - 91.4 %   Platelets 293 150 - 400 K/uL  Basic metabolic panel   Collection Time: 03/25/15  5:55 AM  Result Value Ref Range   Sodium 141 135 - 145 mmol/L   Potassium 3.8 3.5 - 5.1 mmol/L   Chloride 106 101 - 111 mmol/L   CO2 29 22 - 32 mmol/L   Glucose, Bld 90 65 - 99 mg/dL   BUN 22 (H) 6 - 20 mg/dL   Creatinine, Ser 7.82 (H) 0.44 - 1.00 mg/dL   Calcium 9.2 8.9 - 95.6 mg/dL   GFR calc non Af Amer 36 (L) >60 mL/min   GFR calc Af Amer 42 (L) >60 mL/min   Anion gap 6 5 - 15  Heparin level (unfractionated)   Collection Time: 03/25/15  2:25 PM  Result Value Ref Range   Heparin Unfractionated 0.97 (H) 0.30 - 0.70 IU/mL  Heparin level (unfractionated)   Collection Time: 03/25/15  9:48 PM  Result Value Ref Range   Heparin Unfractionated 0.64 0.30 - 0.70 IU/mL  Heparin level (unfractionated)  Collection Time: 03/26/15  6:14 AM  Result Value Ref Range   Heparin Unfractionated 0.54 0.30 - 0.70 IU/mL  CBC   Collection Time: 03/26/15  6:14 AM  Result Value Ref Range   WBC 5.4 4.0 - 10.5 K/uL   RBC 3.48 (L) 3.87 - 5.11 MIL/uL   Hemoglobin 10.5 (L) 12.0 - 15.0 g/dL   HCT 16.1 (L) 09.6 - 04.5 %   MCV 99.4 78.0 - 100.0 fL   MCH 30.2 26.0 - 34.0 pg   MCHC 30.3 30.0 - 36.0 g/dL    RDW 40.9 81.1 - 91.4 %   Platelets 287 150 - 400 K/uL  Protime-INR   Collection Time: 03/26/15  6:14 AM  Result Value Ref Range   Prothrombin Time 14.7 11.6 - 15.2 seconds   INR 1.13 0.00 - 1.49  Heparin level (unfractionated)   Collection Time: 03/27/15  5:45 AM  Result Value Ref Range   Heparin Unfractionated 0.54 0.30 - 0.70 IU/mL  CBC   Collection Time: 03/27/15  5:45 AM  Result Value Ref Range   WBC 5.0 4.0 - 10.5 K/uL   RBC 3.55 (L) 3.87 - 5.11 MIL/uL   Hemoglobin 10.8 (L) 12.0 - 15.0 g/dL   HCT 78.2 (L) 95.6 - 21.3 %   MCV 98.3 78.0 - 100.0 fL   MCH 30.4 26.0 - 34.0 pg   MCHC 30.9 30.0 - 36.0 g/dL   RDW 08.6 57.8 - 46.9 %   Platelets 305 150 - 400 K/uL  Protime-INR   Collection Time: 03/27/15  5:45 AM  Result Value Ref Range   Prothrombin Time 16.9 (H) 11.6 - 15.2 seconds   INR 1.36 0.00 - 1.49  Results for orders placed or performed during the hospital encounter of 08/18/14 (from the past 8736 hour(s))  POC CBG, ED   Collection Time: 08/19/14 12:13 AM  Result Value Ref Range   Glucose-Capillary 62 (L) 70 - 99 mg/dL   Comment 1 Call MD NNP PA CNM   Urinalysis, Routine w reflex microscopic   Collection Time: 08/19/14 12:32 AM  Result Value Ref Range   Color, Urine YELLOW YELLOW   APPearance CLEAR CLEAR   Specific Gravity, Urine 1.025 1.005 - 1.030   pH 5.5 5.0 - 8.0   Glucose, UA NEGATIVE NEGATIVE mg/dL   Hgb urine dipstick NEGATIVE NEGATIVE   Bilirubin Urine NEGATIVE NEGATIVE   Ketones, ur NEGATIVE NEGATIVE mg/dL   Protein, ur NEGATIVE NEGATIVE mg/dL   Urobilinogen, UA 0.2 0.0 - 1.0 mg/dL   Nitrite NEGATIVE NEGATIVE   Leukocytes, UA NEGATIVE NEGATIVE  Culture, blood (routine x 2)   Collection Time: 08/19/14 12:36 AM  Result Value Ref Range   Specimen Description BLOOD RIGHT ANTECUBITAL    Special Requests BOTTLES DRAWN AEROBIC AND ANAEROBIC 10CC EACH    Culture NO GROWTH 5 DAYS    Report Status 08/24/2014 FINAL   Comprehensive metabolic panel    Collection Time: 08/19/14 12:36 AM  Result Value Ref Range   Sodium 140 135 - 145 mmol/L   Potassium 5.9 (H) 3.5 - 5.1 mmol/L   Chloride 107 96 - 112 mmol/L   CO2 28 19 - 32 mmol/L   Glucose, Bld 72 70 - 99 mg/dL   BUN 55 (H) 6 - 23 mg/dL   Creatinine, Ser 6.29 (H) 0.50 - 1.10 mg/dL   Calcium 9.6 8.4 - 52.8 mg/dL   Total Protein 6.4 6.0 - 8.3 g/dL   Albumin 3.2 (L) 3.5 - 5.2 g/dL  AST 44 (H) 0 - 37 U/L   ALT 54 (H) 0 - 35 U/L   Alkaline Phosphatase 134 (H) 39 - 117 U/L   Total Bilirubin 0.3 0.3 - 1.2 mg/dL   GFR calc non Af Amer 30 (L) >90 mL/min   GFR calc Af Amer 34 (L) >90 mL/min   Anion gap 5 5 - 15  CBC with Differential   Collection Time: 08/19/14 12:36 AM  Result Value Ref Range   WBC 4.5 4.0 - 10.5 K/uL   RBC 3.07 (L) 3.87 - 5.11 MIL/uL   Hemoglobin 9.1 (L) 12.0 - 15.0 g/dL   HCT 72.5 (L) 36.6 - 44.0 %   MCV 98.0 78.0 - 100.0 fL   MCH 29.6 26.0 - 34.0 pg   MCHC 30.2 30.0 - 36.0 g/dL   RDW 34.7 (H) 42.5 - 95.6 %   Platelets 132 (L) 150 - 400 K/uL   Neutrophils Relative % 70 43 - 77 %   Neutro Abs 3.1 1.7 - 7.7 K/uL   Lymphocytes Relative 19 12 - 46 %   Lymphs Abs 0.8 0.7 - 4.0 K/uL   Monocytes Relative 7 3 - 12 %   Monocytes Absolute 0.3 0.1 - 1.0 K/uL   Eosinophils Relative 4 0 - 5 %   Eosinophils Absolute 0.2 0.0 - 0.7 K/uL   Basophils Relative 0 0 - 1 %   Basophils Absolute 0.0 0.0 - 0.1 K/uL  TSH   Collection Time: 08/19/14 12:36 AM  Result Value Ref Range   TSH 2.751 0.350 - 4.500 uIU/mL  I-Stat CG4 Lactic Acid, ED   Collection Time: 08/19/14 12:44 AM  Result Value Ref Range   Lactic Acid, Venous 0.78 0.5 - 2.0 mmol/L  Culture, blood (routine x 2)   Collection Time: 08/19/14 12:50 AM  Result Value Ref Range   Specimen Description BLOOD LEFT HAND    Special Requests      BOTTLES DRAWN AEROBIC AND ANAEROBIC AEB=10CC ANA=5CC   Culture NO GROWTH 5 DAYS    Report Status 08/24/2014 FINAL   CBG monitoring, ED   Collection Time: 08/19/14  1:04 AM   Result Value Ref Range   Glucose-Capillary 79 70 - 99 mg/dL  CBG monitoring, ED   Collection Time: 08/19/14  2:25 AM  Result Value Ref Range   Glucose-Capillary 82 70 - 99 mg/dL  MRSA PCR Screening   Collection Time: 08/19/14  3:35 AM  Result Value Ref Range   MRSA by PCR NEGATIVE NEGATIVE  CBC   Collection Time: 08/19/14  5:38 AM  Result Value Ref Range   WBC 4.0 4.0 - 10.5 K/uL   RBC 3.06 (L) 3.87 - 5.11 MIL/uL   Hemoglobin 8.9 (L) 12.0 - 15.0 g/dL   HCT 38.7 (L) 56.4 - 33.2 %   MCV 97.4 78.0 - 100.0 fL   MCH 29.1 26.0 - 34.0 pg   MCHC 29.9 (L) 30.0 - 36.0 g/dL   RDW 95.1 (H) 88.4 - 16.6 %   Platelets 143 (L) 150 - 400 K/uL  Comprehensive metabolic panel   Collection Time: 08/19/14  5:38 AM  Result Value Ref Range   Sodium 143 135 - 145 mmol/L   Potassium 5.8 (H) 3.5 - 5.1 mmol/L   Chloride 110 96 - 112 mmol/L   CO2 27 19 - 32 mmol/L   Glucose, Bld 89 70 - 99 mg/dL   BUN 54 (H) 6 - 23 mg/dL   Creatinine, Ser 0.63 (H) 0.50 - 1.10  mg/dL   Calcium 9.8 8.4 - 81.1 mg/dL   Total Protein 6.3 6.0 - 8.3 g/dL   Albumin 3.1 (L) 3.5 - 5.2 g/dL   AST 45 (H) 0 - 37 U/L   ALT 54 (H) 0 - 35 U/L   Alkaline Phosphatase 132 (H) 39 - 117 U/L   Total Bilirubin 0.3 0.3 - 1.2 mg/dL   GFR calc non Af Amer 32 (L) >90 mL/min   GFR calc Af Amer 37 (L) >90 mL/min   Anion gap 6 5 - 15  Glucose, capillary   Collection Time: 08/19/14 11:11 AM  Result Value Ref Range   Glucose-Capillary 64 (L) 70 - 99 mg/dL   Comment 1 Notify RN    Comment 2 Document in Chart   Glucose, capillary   Collection Time: 08/19/14 11:51 AM  Result Value Ref Range   Glucose-Capillary 75 70 - 99 mg/dL  Cortisol-am, blood   Collection Time: 08/19/14  3:43 PM  Result Value Ref Range   Cortisol - AM 16.2 4.3 - 22.4 ug/dL  T4, free   Collection Time: 08/19/14  3:43 PM  Result Value Ref Range   Free T4 0.97 0.80 - 1.80 ng/dL  Potassium   Collection Time: 08/19/14  3:49 PM  Result Value Ref Range   Potassium 5.4  (H) 3.5 - 5.1 mmol/L  Glucose, capillary   Collection Time: 08/19/14  4:53 PM  Result Value Ref Range   Glucose-Capillary 87 70 - 99 mg/dL   Comment 1 Notify RN    Comment 2 Document in Chart   Glucose, capillary   Collection Time: 08/19/14  9:57 PM  Result Value Ref Range   Glucose-Capillary 77 70 - 99 mg/dL  Glucose, capillary   Collection Time: 08/20/14  5:05 AM  Result Value Ref Range   Glucose-Capillary 73 70 - 99 mg/dL   Comment 1 Notify RN   Basic metabolic panel   Collection Time: 08/20/14  5:08 AM  Result Value Ref Range   Sodium 141 135 - 145 mmol/L   Potassium 5.0 3.5 - 5.1 mmol/L   Chloride 106 96 - 112 mmol/L   CO2 27 19 - 32 mmol/L   Glucose, Bld 75 70 - 99 mg/dL   BUN 47 (H) 6 - 23 mg/dL   Creatinine, Ser 9.14 (H) 0.50 - 1.10 mg/dL   Calcium 9.6 8.4 - 78.2 mg/dL   GFR calc non Af Amer 26 (L) >90 mL/min   GFR calc Af Amer 30 (L) >90 mL/min   Anion gap 8 5 - 15  CBC   Collection Time: 08/20/14  5:08 AM  Result Value Ref Range   WBC 4.6 4.0 - 10.5 K/uL   RBC 3.27 (L) 3.87 - 5.11 MIL/uL   Hemoglobin 9.4 (L) 12.0 - 15.0 g/dL   HCT 95.6 (L) 21.3 - 08.6 %   MCV 96.6 78.0 - 100.0 fL   MCH 28.7 26.0 - 34.0 pg   MCHC 29.7 (L) 30.0 - 36.0 g/dL   RDW 57.8 (H) 46.9 - 62.9 %   Platelets 162 150 - 400 K/uL  Hemoglobin A1c   Collection Time: 08/20/14  5:08 AM  Result Value Ref Range   Hgb A1c MFr Bld 5.5 4.8 - 5.6 %   Mean Plasma Glucose 111 mg/dL  Glucose, capillary   Collection Time: 08/20/14  7:23 AM  Result Value Ref Range   Glucose-Capillary 83 70 - 99 mg/dL   Comment 1 Notify RN    Comment 2  Document in Chart   Vitamin B12   Collection Time: 08/20/14  8:59 AM  Result Value Ref Range   Vitamin B-12 1437 (H) 211 - 911 pg/mL  Iron and TIBC   Collection Time: 08/20/14  8:59 AM  Result Value Ref Range   Iron 118 42 - 145 ug/dL   TIBC 161 096 - 045 ug/dL   Saturation Ratios 35 20 - 55 %   UIBC 220 125 - 400 ug/dL  Ferritin   Collection Time: 08/20/14   8:59 AM  Result Value Ref Range   Ferritin 58 10 - 291 ng/mL  Glucose, capillary   Collection Time: 08/20/14 11:35 AM  Result Value Ref Range   Glucose-Capillary 102 (H) 70 - 99 mg/dL  Glucose, capillary   Collection Time: 08/20/14  4:45 PM  Result Value Ref Range   Glucose-Capillary 86 70 - 99 mg/dL   Comment 1 Notify RN    Comment 2 Document in Chart   Glucose, capillary   Collection Time: 08/20/14  7:52 PM  Result Value Ref Range   Glucose-Capillary 97 70 - 99 mg/dL  Glucose, capillary   Collection Time: 08/21/14  2:09 AM  Result Value Ref Range   Glucose-Capillary 97 70 - 99 mg/dL  Glucose, capillary   Collection Time: 08/21/14  4:45 AM  Result Value Ref Range   Glucose-Capillary 81 70 - 99 mg/dL  CBC   Collection Time: 08/21/14  6:19 AM  Result Value Ref Range   WBC 4.9 4.0 - 10.5 K/uL   RBC 3.23 (L) 3.87 - 5.11 MIL/uL   Hemoglobin 9.5 (L) 12.0 - 15.0 g/dL   HCT 40.9 (L) 81.1 - 91.4 %   MCV 96.3 78.0 - 100.0 fL   MCH 29.4 26.0 - 34.0 pg   MCHC 30.5 30.0 - 36.0 g/dL   RDW 78.2 (H) 95.6 - 21.3 %   Platelets 152 150 - 400 K/uL  Basic metabolic panel   Collection Time: 08/21/14  6:19 AM  Result Value Ref Range   Sodium 143 135 - 145 mmol/L   Potassium 4.1 3.5 - 5.1 mmol/L   Chloride 107 96 - 112 mmol/L   CO2 28 19 - 32 mmol/L   Glucose, Bld 88 70 - 99 mg/dL   BUN 37 (H) 6 - 23 mg/dL   Creatinine, Ser 0.86 (H) 0.50 - 1.10 mg/dL   Calcium 9.8 8.4 - 57.8 mg/dL   GFR calc non Af Amer 25 (L) >90 mL/min   GFR calc Af Amer 29 (L) >90 mL/min   Anion gap 8 5 - 15  Glucose, capillary   Collection Time: 08/21/14  7:39 AM  Result Value Ref Range   Glucose-Capillary 79 70 - 99 mg/dL   Comment 1 Notify RN    Comment 2 Document in Chart   Glucose, capillary   Collection Time: 08/21/14 11:29 AM  Result Value Ref Range   Glucose-Capillary 95 70 - 99 mg/dL   Comment 1 Notify RN    Comment 2 Document in Chart   Glucose, capillary   Collection Time: 08/21/14  4:23 PM   Result Value Ref Range   Glucose-Capillary 102 (H) 70 - 99 mg/dL   Comment 1 Notify RN    Comment 2 Document in Chart   Glucose, capillary   Collection Time: 08/21/14  9:02 PM  Result Value Ref Range   Glucose-Capillary 97 70 - 99 mg/dL   Comment 1 Notify RN   Basic metabolic panel   Collection  Time: 08/22/14  6:27 AM  Result Value Ref Range   Sodium 143 135 - 145 mmol/L   Potassium 3.9 3.5 - 5.1 mmol/L   Chloride 107 96 - 112 mmol/L   CO2 27 19 - 32 mmol/L   Glucose, Bld 94 70 - 99 mg/dL   BUN 33 (H) 6 - 23 mg/dL   Creatinine, Ser 9.62 (H) 0.50 - 1.10 mg/dL   Calcium 9.6 8.4 - 95.2 mg/dL   GFR calc non Af Amer 28 (L) >90 mL/min   GFR calc Af Amer 33 (L) >90 mL/min   Anion gap 9 5 - 15  Glucose, capillary   Collection Time: 08/22/14  7:33 AM  Result Value Ref Range   Glucose-Capillary 87 70 - 99 mg/dL  Glucose, capillary   Collection Time: 08/22/14 11:38 AM  Result Value Ref Range   Glucose-Capillary 77 70 - 99 mg/dL  Results for orders placed or performed during the hospital encounter of 06/20/14 (from the past 8736 hour(s))  CBC with Differential   Collection Time: 06/20/14  9:43 PM  Result Value Ref Range   WBC 7.7 4.0 - 10.5 K/uL   RBC 3.64 (L) 3.87 - 5.11 MIL/uL   Hemoglobin 10.5 (L) 12.0 - 15.0 g/dL   HCT 84.1 (L) 32.4 - 40.1 %   MCV 96.4 78.0 - 100.0 fL   MCH 28.8 26.0 - 34.0 pg   MCHC 29.9 (L) 30.0 - 36.0 g/dL   RDW 02.7 25.3 - 66.4 %   Platelets 246 150 - 400 K/uL   Neutrophils Relative % 75 43 - 77 %   Neutro Abs 5.8 1.7 - 7.7 K/uL   Lymphocytes Relative 13 12 - 46 %   Lymphs Abs 1.0 0.7 - 4.0 K/uL   Monocytes Relative 9 3 - 12 %   Monocytes Absolute 0.7 0.1 - 1.0 K/uL   Eosinophils Relative 3 0 - 5 %   Eosinophils Absolute 0.2 0.0 - 0.7 K/uL   Basophils Relative 0 0 - 1 %   Basophils Absolute 0.0 0.0 - 0.1 K/uL  Comprehensive metabolic panel   Collection Time: 06/20/14  9:43 PM  Result Value Ref Range   Sodium 140 135 - 145 mmol/L   Potassium 4.2  3.5 - 5.1 mmol/L   Chloride 108 96 - 112 mmol/L   CO2 29 19 - 32 mmol/L   Glucose, Bld 113 (H) 70 - 99 mg/dL   BUN 36 (H) 6 - 23 mg/dL   Creatinine, Ser 4.03 (H) 0.50 - 1.10 mg/dL   Calcium 9.5 8.4 - 47.4 mg/dL   Total Protein 7.4 6.0 - 8.3 g/dL   Albumin 3.6 3.5 - 5.2 g/dL   AST 22 0 - 37 U/L   ALT 17 0 - 35 U/L   Alkaline Phosphatase 113 39 - 117 U/L   Total Bilirubin 0.3 0.3 - 1.2 mg/dL   GFR calc non Af Amer 28 (L) >90 mL/min   GFR calc Af Amer 33 (L) >90 mL/min   Anion gap 3 (L) 5 - 15  Results for orders placed or performed during the hospital encounter of 06/15/14 (from the past 8736 hour(s))  CBC with Differential   Collection Time: 06/15/14  5:35 PM  Result Value Ref Range   WBC 7.2 4.0 - 10.5 K/uL   RBC 3.61 (L) 3.87 - 5.11 MIL/uL   Hemoglobin 10.3 (L) 12.0 - 15.0 g/dL   HCT 25.9 (L) 56.3 - 87.5 %   MCV 95.6 78.0 -  100.0 fL   MCH 28.5 26.0 - 34.0 pg   MCHC 29.9 (L) 30.0 - 36.0 g/dL   RDW 16.1 09.6 - 04.5 %   Platelets 236 150 - 400 K/uL   Neutrophils Relative % 74 43 - 77 %   Neutro Abs 5.3 1.7 - 7.7 K/uL   Lymphocytes Relative 15 12 - 46 %   Lymphs Abs 1.1 0.7 - 4.0 K/uL   Monocytes Relative 9 3 - 12 %   Monocytes Absolute 0.7 0.1 - 1.0 K/uL   Eosinophils Relative 2 0 - 5 %   Eosinophils Absolute 0.2 0.0 - 0.7 K/uL   Basophils Relative 0 0 - 1 %   Basophils Absolute 0.0 0.0 - 0.1 K/uL  I-Stat CG4 Lactic Acid, ED   Collection Time: 06/15/14  5:48 PM  Result Value Ref Range   Lactic Acid, Venous 0.92 0.5 - 2.0 mmol/L  Comprehensive metabolic panel   Collection Time: 06/15/14  6:40 PM  Result Value Ref Range   Sodium 141 135 - 145 mmol/L   Potassium 4.2 3.5 - 5.1 mmol/L   Chloride 107 96 - 112 mmol/L   CO2 28 19 - 32 mmol/L   Glucose, Bld 103 (H) 70 - 99 mg/dL   BUN 36 (H) 6 - 23 mg/dL   Creatinine, Ser 4.09 (H) 0.50 - 1.10 mg/dL   Calcium 9.8 8.4 - 81.1 mg/dL   Total Protein 7.2 6.0 - 8.3 g/dL   Albumin 3.7 3.5 - 5.2 g/dL   AST 23 0 - 37 U/L   ALT 18  0 - 35 U/L   Alkaline Phosphatase 124 (H) 39 - 117 U/L   Total Bilirubin 0.5 0.3 - 1.2 mg/dL   GFR calc non Af Amer 32 (L) >90 mL/min   GFR calc Af Amer 37 (L) >90 mL/min   Anion gap 6 5 - 15   she had labs done last week with her primary care physician we will get the result 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 We'll continue Wellbutrin and Paxil for depression, Lamictal for mood stabilization, Risperdal for agitation and Cogentin for side effects from Risperdal. She'll take clonazepam 0.5 mg daily at 5 PM for at anxiety. She will start Aricept 5 mg at bedtime for dementia progression the risk and benefits of the medication in detail.    Time spent 15 minutes.  More than 50% of the time spent and psychoeducation, counseling and coordination of care. She'll return in 3 months  MEDICATIONS this encounter: Meds ordered this encounter  Medications  . lisinopril (PRINIVIL,ZESTRIL) 2.5 MG tablet    Sig: Take 2.5 mg by mouth daily.  Marland Kitchen albuterol (PROVENTIL) (2.5 MG/3ML) 0.083% nebulizer solution    Sig: Take 2.5 mg by nebulization 2 (two) times daily.  . cloNIDine (CATAPRES) 0.1 MG tablet    Sig: Take 0.1 mg by mouth as needed.  . risperiDONE (RISPERDAL) 1 MG tablet    Sig: Take 1 tablet (1 mg total) by mouth at bedtime.    Dispense:  30 tablet    Refill:  2  . benztropine (COGENTIN) 0.5 MG tablet    Sig: Take 1 tablet (0.5 mg total) by mouth 2 (two) times daily.    Dispense:  60 tablet  Refill:  2  . lamoTRIgine (LAMICTAL) 100 MG tablet    Sig: Take 1 tablet (100 mg total) by mouth 2 (two) times daily.    Dispense:  60 tablet    Refill:  2  . buPROPion (WELLBUTRIN XL) 300 MG 24 hr tablet    Sig: Take 1 tablet (300 mg total)  by mouth every morning.    Dispense:  30 tablet    Refill:  2  . PARoxetine (PAXIL) 40 MG tablet    Sig: Take 1 tablet (40 mg total) by mouth at bedtime.    Dispense:  30 tablet    Refill:  2  . risperiDONE (RISPERDAL) 0.25 MG tablet    Sig: Take 1 tablet (0.25 mg total) by mouth 3 (three) times daily. Takes at 8:00AM, 2:00PM, 5:00PM.    Dispense:  90 tablet    Refill:  2  . clonazePAM (KLONOPIN) 0.5 MG tablet    Sig: Take daily at 5 pm    Dispense:  30 tablet    Refill:  2  . donepezil (ARICEPT) 5 MG tablet    Sig: Take 1 tablet (5 mg total) by mouth at bedtime.    Dispense:  30 tablet    Refill:  2    Medical Decision Making Problem Points:  Established problem, stable/improving (1), Established problem, worsening (2), New problem, with additional work-up planned (4), Review of last therapy session (1) and Review of psycho-social stressors (1) Data Points:  Review of medication regiment & side effects (2) Review of new medications or change in dosage (2)  I certify that outpatient services furnished can reasonably be expected to improve the patient's condition.   Diannia Ruder, MD

## 2015-05-31 DIAGNOSIS — L02416 Cutaneous abscess of left lower limb: Secondary | ICD-10-CM | POA: Diagnosis not present

## 2015-05-31 DIAGNOSIS — D696 Thrombocytopenia, unspecified: Secondary | ICD-10-CM | POA: Diagnosis not present

## 2015-05-31 DIAGNOSIS — I4891 Unspecified atrial fibrillation: Secondary | ICD-10-CM | POA: Diagnosis not present

## 2015-05-31 DIAGNOSIS — N183 Chronic kidney disease, stage 3 (moderate): Secondary | ICD-10-CM | POA: Diagnosis not present

## 2015-05-31 DIAGNOSIS — R700 Elevated erythrocyte sedimentation rate: Secondary | ICD-10-CM | POA: Diagnosis not present

## 2015-05-31 DIAGNOSIS — R1310 Dysphagia, unspecified: Secondary | ICD-10-CM | POA: Diagnosis not present

## 2015-05-31 DIAGNOSIS — N390 Urinary tract infection, site not specified: Secondary | ICD-10-CM | POA: Diagnosis not present

## 2015-05-31 DIAGNOSIS — M6281 Muscle weakness (generalized): Secondary | ICD-10-CM | POA: Diagnosis not present

## 2015-05-31 DIAGNOSIS — I82409 Acute embolism and thrombosis of unspecified deep veins of unspecified lower extremity: Secondary | ICD-10-CM | POA: Diagnosis not present

## 2015-06-01 DIAGNOSIS — M6281 Muscle weakness (generalized): Secondary | ICD-10-CM | POA: Diagnosis not present

## 2015-06-01 DIAGNOSIS — R1310 Dysphagia, unspecified: Secondary | ICD-10-CM | POA: Diagnosis not present

## 2015-06-01 DIAGNOSIS — D72829 Elevated white blood cell count, unspecified: Secondary | ICD-10-CM | POA: Diagnosis not present

## 2015-06-01 DIAGNOSIS — L02416 Cutaneous abscess of left lower limb: Secondary | ICD-10-CM | POA: Diagnosis not present

## 2015-06-02 DIAGNOSIS — D72829 Elevated white blood cell count, unspecified: Secondary | ICD-10-CM | POA: Diagnosis not present

## 2015-06-02 DIAGNOSIS — D649 Anemia, unspecified: Secondary | ICD-10-CM | POA: Diagnosis not present

## 2015-06-02 DIAGNOSIS — I10 Essential (primary) hypertension: Secondary | ICD-10-CM | POA: Diagnosis not present

## 2015-06-02 DIAGNOSIS — I82421 Acute embolism and thrombosis of right iliac vein: Secondary | ICD-10-CM | POA: Diagnosis not present

## 2015-06-02 DIAGNOSIS — Z7901 Long term (current) use of anticoagulants: Secondary | ICD-10-CM | POA: Diagnosis not present

## 2015-06-02 DIAGNOSIS — I4891 Unspecified atrial fibrillation: Secondary | ICD-10-CM | POA: Diagnosis not present

## 2015-06-03 DIAGNOSIS — L02416 Cutaneous abscess of left lower limb: Secondary | ICD-10-CM | POA: Diagnosis not present

## 2015-06-03 DIAGNOSIS — M6281 Muscle weakness (generalized): Secondary | ICD-10-CM | POA: Diagnosis not present

## 2015-06-03 DIAGNOSIS — R1310 Dysphagia, unspecified: Secondary | ICD-10-CM | POA: Diagnosis not present

## 2015-06-05 DIAGNOSIS — L02416 Cutaneous abscess of left lower limb: Secondary | ICD-10-CM | POA: Diagnosis not present

## 2015-06-05 DIAGNOSIS — D696 Thrombocytopenia, unspecified: Secondary | ICD-10-CM | POA: Diagnosis not present

## 2015-06-05 DIAGNOSIS — M6281 Muscle weakness (generalized): Secondary | ICD-10-CM | POA: Diagnosis not present

## 2015-06-05 DIAGNOSIS — I4891 Unspecified atrial fibrillation: Secondary | ICD-10-CM | POA: Diagnosis not present

## 2015-06-05 DIAGNOSIS — R1310 Dysphagia, unspecified: Secondary | ICD-10-CM | POA: Diagnosis not present

## 2015-06-06 DIAGNOSIS — D696 Thrombocytopenia, unspecified: Secondary | ICD-10-CM | POA: Diagnosis not present

## 2015-06-06 DIAGNOSIS — R300 Dysuria: Secondary | ICD-10-CM | POA: Diagnosis not present

## 2015-06-06 DIAGNOSIS — R0682 Tachypnea, not elsewhere classified: Secondary | ICD-10-CM | POA: Diagnosis not present

## 2015-06-06 DIAGNOSIS — M6281 Muscle weakness (generalized): Secondary | ICD-10-CM | POA: Diagnosis not present

## 2015-06-06 DIAGNOSIS — L02416 Cutaneous abscess of left lower limb: Secondary | ICD-10-CM | POA: Diagnosis not present

## 2015-06-06 DIAGNOSIS — R1310 Dysphagia, unspecified: Secondary | ICD-10-CM | POA: Diagnosis not present

## 2015-06-07 DIAGNOSIS — L89623 Pressure ulcer of left heel, stage 3: Secondary | ICD-10-CM | POA: Diagnosis not present

## 2015-06-07 DIAGNOSIS — M6281 Muscle weakness (generalized): Secondary | ICD-10-CM | POA: Diagnosis not present

## 2015-06-07 DIAGNOSIS — R1310 Dysphagia, unspecified: Secondary | ICD-10-CM | POA: Diagnosis not present

## 2015-06-07 DIAGNOSIS — L02416 Cutaneous abscess of left lower limb: Secondary | ICD-10-CM | POA: Diagnosis not present

## 2015-06-08 DIAGNOSIS — R1310 Dysphagia, unspecified: Secondary | ICD-10-CM | POA: Diagnosis not present

## 2015-06-08 DIAGNOSIS — L02416 Cutaneous abscess of left lower limb: Secondary | ICD-10-CM | POA: Diagnosis not present

## 2015-06-08 DIAGNOSIS — N390 Urinary tract infection, site not specified: Secondary | ICD-10-CM | POA: Diagnosis not present

## 2015-06-08 DIAGNOSIS — N183 Chronic kidney disease, stage 3 (moderate): Secondary | ICD-10-CM | POA: Diagnosis not present

## 2015-06-08 DIAGNOSIS — M6281 Muscle weakness (generalized): Secondary | ICD-10-CM | POA: Diagnosis not present

## 2015-06-08 DIAGNOSIS — I82409 Acute embolism and thrombosis of unspecified deep veins of unspecified lower extremity: Secondary | ICD-10-CM | POA: Diagnosis not present

## 2015-06-09 DIAGNOSIS — R1310 Dysphagia, unspecified: Secondary | ICD-10-CM | POA: Diagnosis not present

## 2015-06-09 DIAGNOSIS — M6281 Muscle weakness (generalized): Secondary | ICD-10-CM | POA: Diagnosis not present

## 2015-06-09 DIAGNOSIS — L02416 Cutaneous abscess of left lower limb: Secondary | ICD-10-CM | POA: Diagnosis not present

## 2015-06-11 DIAGNOSIS — M6281 Muscle weakness (generalized): Secondary | ICD-10-CM | POA: Diagnosis not present

## 2015-06-11 DIAGNOSIS — R1310 Dysphagia, unspecified: Secondary | ICD-10-CM | POA: Diagnosis not present

## 2015-06-11 DIAGNOSIS — L02416 Cutaneous abscess of left lower limb: Secondary | ICD-10-CM | POA: Diagnosis not present

## 2015-06-12 DIAGNOSIS — I4891 Unspecified atrial fibrillation: Secondary | ICD-10-CM | POA: Diagnosis not present

## 2015-06-12 DIAGNOSIS — R0682 Tachypnea, not elsewhere classified: Secondary | ICD-10-CM | POA: Diagnosis not present

## 2015-06-12 DIAGNOSIS — Z7901 Long term (current) use of anticoagulants: Secondary | ICD-10-CM | POA: Diagnosis not present

## 2015-06-12 DIAGNOSIS — D649 Anemia, unspecified: Secondary | ICD-10-CM | POA: Diagnosis not present

## 2015-06-13 DIAGNOSIS — I4891 Unspecified atrial fibrillation: Secondary | ICD-10-CM | POA: Diagnosis not present

## 2015-06-13 DIAGNOSIS — L02416 Cutaneous abscess of left lower limb: Secondary | ICD-10-CM | POA: Diagnosis not present

## 2015-06-13 DIAGNOSIS — R1310 Dysphagia, unspecified: Secondary | ICD-10-CM | POA: Diagnosis not present

## 2015-06-13 DIAGNOSIS — M6281 Muscle weakness (generalized): Secondary | ICD-10-CM | POA: Diagnosis not present

## 2015-06-14 DIAGNOSIS — R278 Other lack of coordination: Secondary | ICD-10-CM | POA: Diagnosis not present

## 2015-06-14 DIAGNOSIS — Z7901 Long term (current) use of anticoagulants: Secondary | ICD-10-CM | POA: Diagnosis not present

## 2015-06-14 DIAGNOSIS — I4891 Unspecified atrial fibrillation: Secondary | ICD-10-CM | POA: Diagnosis not present

## 2015-06-14 DIAGNOSIS — R1311 Dysphagia, oral phase: Secondary | ICD-10-CM | POA: Diagnosis not present

## 2015-06-14 DIAGNOSIS — L89623 Pressure ulcer of left heel, stage 3: Secondary | ICD-10-CM | POA: Diagnosis not present

## 2015-06-14 DIAGNOSIS — R5381 Other malaise: Secondary | ICD-10-CM | POA: Diagnosis not present

## 2015-06-14 DIAGNOSIS — L02416 Cutaneous abscess of left lower limb: Secondary | ICD-10-CM | POA: Diagnosis not present

## 2015-06-14 DIAGNOSIS — M6281 Muscle weakness (generalized): Secondary | ICD-10-CM | POA: Diagnosis not present

## 2015-06-14 DIAGNOSIS — Z741 Need for assistance with personal care: Secondary | ICD-10-CM | POA: Diagnosis not present

## 2015-06-14 DIAGNOSIS — R1310 Dysphagia, unspecified: Secondary | ICD-10-CM | POA: Diagnosis not present

## 2015-06-14 DIAGNOSIS — R262 Difficulty in walking, not elsewhere classified: Secondary | ICD-10-CM | POA: Diagnosis not present

## 2015-06-14 DIAGNOSIS — R2681 Unsteadiness on feet: Secondary | ICD-10-CM | POA: Diagnosis not present

## 2015-06-15 DIAGNOSIS — R1310 Dysphagia, unspecified: Secondary | ICD-10-CM | POA: Diagnosis not present

## 2015-06-15 DIAGNOSIS — L02416 Cutaneous abscess of left lower limb: Secondary | ICD-10-CM | POA: Diagnosis not present

## 2015-06-15 DIAGNOSIS — R262 Difficulty in walking, not elsewhere classified: Secondary | ICD-10-CM | POA: Diagnosis not present

## 2015-06-15 DIAGNOSIS — R278 Other lack of coordination: Secondary | ICD-10-CM | POA: Diagnosis not present

## 2015-06-15 DIAGNOSIS — R2681 Unsteadiness on feet: Secondary | ICD-10-CM | POA: Diagnosis not present

## 2015-06-15 DIAGNOSIS — R1311 Dysphagia, oral phase: Secondary | ICD-10-CM | POA: Diagnosis not present

## 2015-06-15 DIAGNOSIS — R5381 Other malaise: Secondary | ICD-10-CM | POA: Diagnosis not present

## 2015-06-15 DIAGNOSIS — M6281 Muscle weakness (generalized): Secondary | ICD-10-CM | POA: Diagnosis not present

## 2015-06-15 DIAGNOSIS — Z741 Need for assistance with personal care: Secondary | ICD-10-CM | POA: Diagnosis not present

## 2015-06-16 DIAGNOSIS — R262 Difficulty in walking, not elsewhere classified: Secondary | ICD-10-CM | POA: Diagnosis not present

## 2015-06-16 DIAGNOSIS — L02416 Cutaneous abscess of left lower limb: Secondary | ICD-10-CM | POA: Diagnosis not present

## 2015-06-16 DIAGNOSIS — R278 Other lack of coordination: Secondary | ICD-10-CM | POA: Diagnosis not present

## 2015-06-16 DIAGNOSIS — R1310 Dysphagia, unspecified: Secondary | ICD-10-CM | POA: Diagnosis not present

## 2015-06-16 DIAGNOSIS — M6281 Muscle weakness (generalized): Secondary | ICD-10-CM | POA: Diagnosis not present

## 2015-06-16 DIAGNOSIS — Z741 Need for assistance with personal care: Secondary | ICD-10-CM | POA: Diagnosis not present

## 2015-06-16 DIAGNOSIS — R2681 Unsteadiness on feet: Secondary | ICD-10-CM | POA: Diagnosis not present

## 2015-06-16 DIAGNOSIS — R5381 Other malaise: Secondary | ICD-10-CM | POA: Diagnosis not present

## 2015-06-16 DIAGNOSIS — R1311 Dysphagia, oral phase: Secondary | ICD-10-CM | POA: Diagnosis not present

## 2015-06-17 DIAGNOSIS — D696 Thrombocytopenia, unspecified: Secondary | ICD-10-CM | POA: Diagnosis not present

## 2015-06-17 DIAGNOSIS — I4891 Unspecified atrial fibrillation: Secondary | ICD-10-CM | POA: Diagnosis not present

## 2015-06-20 DIAGNOSIS — R0682 Tachypnea, not elsewhere classified: Secondary | ICD-10-CM | POA: Diagnosis not present

## 2015-06-20 DIAGNOSIS — D72829 Elevated white blood cell count, unspecified: Secondary | ICD-10-CM | POA: Diagnosis not present

## 2015-06-20 DIAGNOSIS — I10 Essential (primary) hypertension: Secondary | ICD-10-CM | POA: Diagnosis not present

## 2015-06-21 DIAGNOSIS — L89623 Pressure ulcer of left heel, stage 3: Secondary | ICD-10-CM | POA: Diagnosis not present

## 2015-06-26 DIAGNOSIS — R5381 Other malaise: Secondary | ICD-10-CM | POA: Diagnosis not present

## 2015-06-26 DIAGNOSIS — I4891 Unspecified atrial fibrillation: Secondary | ICD-10-CM | POA: Diagnosis not present

## 2015-06-26 DIAGNOSIS — R1310 Dysphagia, unspecified: Secondary | ICD-10-CM | POA: Diagnosis not present

## 2015-06-26 DIAGNOSIS — R0682 Tachypnea, not elsewhere classified: Secondary | ICD-10-CM | POA: Diagnosis not present

## 2015-06-26 DIAGNOSIS — D649 Anemia, unspecified: Secondary | ICD-10-CM | POA: Diagnosis not present

## 2015-06-26 DIAGNOSIS — I8290 Acute embolism and thrombosis of unspecified vein: Secondary | ICD-10-CM | POA: Diagnosis not present

## 2015-06-26 DIAGNOSIS — D72829 Elevated white blood cell count, unspecified: Secondary | ICD-10-CM | POA: Diagnosis not present

## 2015-06-26 DIAGNOSIS — R262 Difficulty in walking, not elsewhere classified: Secondary | ICD-10-CM | POA: Diagnosis not present

## 2015-06-26 DIAGNOSIS — L89603 Pressure ulcer of unspecified heel, stage 3: Secondary | ICD-10-CM | POA: Diagnosis not present

## 2015-06-26 DIAGNOSIS — M6281 Muscle weakness (generalized): Secondary | ICD-10-CM | POA: Diagnosis not present

## 2015-06-26 DIAGNOSIS — Z741 Need for assistance with personal care: Secondary | ICD-10-CM | POA: Diagnosis not present

## 2015-06-26 DIAGNOSIS — R627 Adult failure to thrive: Secondary | ICD-10-CM | POA: Diagnosis not present

## 2015-06-26 DIAGNOSIS — R1311 Dysphagia, oral phase: Secondary | ICD-10-CM | POA: Diagnosis not present

## 2015-06-26 DIAGNOSIS — R278 Other lack of coordination: Secondary | ICD-10-CM | POA: Diagnosis not present

## 2015-06-26 DIAGNOSIS — R2681 Unsteadiness on feet: Secondary | ICD-10-CM | POA: Diagnosis not present

## 2015-06-26 DIAGNOSIS — L02416 Cutaneous abscess of left lower limb: Secondary | ICD-10-CM | POA: Diagnosis not present

## 2015-06-28 DIAGNOSIS — Z7901 Long term (current) use of anticoagulants: Secondary | ICD-10-CM | POA: Diagnosis not present

## 2015-06-28 DIAGNOSIS — I8290 Acute embolism and thrombosis of unspecified vein: Secondary | ICD-10-CM | POA: Diagnosis not present

## 2015-06-28 DIAGNOSIS — L89623 Pressure ulcer of left heel, stage 3: Secondary | ICD-10-CM | POA: Diagnosis not present

## 2015-06-28 DIAGNOSIS — I4891 Unspecified atrial fibrillation: Secondary | ICD-10-CM | POA: Diagnosis not present

## 2015-07-01 DIAGNOSIS — I82421 Acute embolism and thrombosis of right iliac vein: Secondary | ICD-10-CM | POA: Diagnosis not present

## 2015-07-01 DIAGNOSIS — I4891 Unspecified atrial fibrillation: Secondary | ICD-10-CM | POA: Diagnosis not present

## 2015-07-05 DIAGNOSIS — L89623 Pressure ulcer of left heel, stage 3: Secondary | ICD-10-CM | POA: Diagnosis not present

## 2015-07-05 DIAGNOSIS — I4891 Unspecified atrial fibrillation: Secondary | ICD-10-CM | POA: Diagnosis not present

## 2015-07-05 DIAGNOSIS — Z7901 Long term (current) use of anticoagulants: Secondary | ICD-10-CM | POA: Diagnosis not present

## 2015-07-05 DIAGNOSIS — R0682 Tachypnea, not elsewhere classified: Secondary | ICD-10-CM | POA: Diagnosis not present

## 2015-07-05 DIAGNOSIS — D649 Anemia, unspecified: Secondary | ICD-10-CM | POA: Diagnosis not present

## 2015-07-07 DIAGNOSIS — D696 Thrombocytopenia, unspecified: Secondary | ICD-10-CM | POA: Diagnosis not present

## 2015-07-07 DIAGNOSIS — I4891 Unspecified atrial fibrillation: Secondary | ICD-10-CM | POA: Diagnosis not present

## 2015-07-10 DIAGNOSIS — I4891 Unspecified atrial fibrillation: Secondary | ICD-10-CM | POA: Diagnosis not present

## 2015-07-10 DIAGNOSIS — I82421 Acute embolism and thrombosis of right iliac vein: Secondary | ICD-10-CM | POA: Diagnosis not present

## 2015-07-10 DIAGNOSIS — R0682 Tachypnea, not elsewhere classified: Secondary | ICD-10-CM | POA: Diagnosis not present

## 2015-07-10 DIAGNOSIS — D649 Anemia, unspecified: Secondary | ICD-10-CM | POA: Diagnosis not present

## 2015-07-12 DIAGNOSIS — L89623 Pressure ulcer of left heel, stage 3: Secondary | ICD-10-CM | POA: Diagnosis not present

## 2015-07-13 DIAGNOSIS — I4891 Unspecified atrial fibrillation: Secondary | ICD-10-CM | POA: Diagnosis not present

## 2015-07-13 DIAGNOSIS — D696 Thrombocytopenia, unspecified: Secondary | ICD-10-CM | POA: Diagnosis not present

## 2015-07-15 ENCOUNTER — Inpatient Hospital Stay (HOSPITAL_COMMUNITY)
Admission: EM | Admit: 2015-07-15 | Discharge: 2015-07-18 | DRG: 682 | Disposition: A | Discharge status: Skilled Nursing Facility | Payer: Medicare Other | Attending: Family Medicine | Admitting: Family Medicine

## 2015-07-15 ENCOUNTER — Emergency Department (HOSPITAL_COMMUNITY): Payer: Medicare Other

## 2015-07-15 ENCOUNTER — Encounter (HOSPITAL_COMMUNITY): Payer: Self-pay | Admitting: *Deleted

## 2015-07-15 DIAGNOSIS — Z881 Allergy status to other antibiotic agents status: Secondary | ICD-10-CM | POA: Diagnosis not present

## 2015-07-15 DIAGNOSIS — F0390 Unspecified dementia without behavioral disturbance: Secondary | ICD-10-CM | POA: Diagnosis not present

## 2015-07-15 DIAGNOSIS — G4700 Insomnia, unspecified: Secondary | ICD-10-CM | POA: Diagnosis present

## 2015-07-15 DIAGNOSIS — Z86718 Personal history of other venous thrombosis and embolism: Secondary | ICD-10-CM

## 2015-07-15 DIAGNOSIS — J189 Pneumonia, unspecified organism: Secondary | ICD-10-CM | POA: Diagnosis not present

## 2015-07-15 DIAGNOSIS — E559 Vitamin D deficiency, unspecified: Secondary | ICD-10-CM | POA: Diagnosis not present

## 2015-07-15 DIAGNOSIS — R410 Disorientation, unspecified: Secondary | ICD-10-CM | POA: Diagnosis not present

## 2015-07-15 DIAGNOSIS — Z79899 Other long term (current) drug therapy: Secondary | ICD-10-CM | POA: Diagnosis not present

## 2015-07-15 DIAGNOSIS — D696 Thrombocytopenia, unspecified: Secondary | ICD-10-CM | POA: Diagnosis not present

## 2015-07-15 DIAGNOSIS — K648 Other hemorrhoids: Secondary | ICD-10-CM | POA: Diagnosis not present

## 2015-07-15 DIAGNOSIS — Z7982 Long term (current) use of aspirin: Secondary | ICD-10-CM

## 2015-07-15 DIAGNOSIS — E785 Hyperlipidemia, unspecified: Secondary | ICD-10-CM | POA: Diagnosis present

## 2015-07-15 DIAGNOSIS — Z66 Do not resuscitate: Secondary | ICD-10-CM | POA: Diagnosis not present

## 2015-07-15 DIAGNOSIS — Z8679 Personal history of other diseases of the circulatory system: Secondary | ICD-10-CM

## 2015-07-15 DIAGNOSIS — Z7901 Long term (current) use of anticoagulants: Secondary | ICD-10-CM | POA: Diagnosis not present

## 2015-07-15 DIAGNOSIS — F339 Major depressive disorder, recurrent, unspecified: Secondary | ICD-10-CM | POA: Diagnosis not present

## 2015-07-15 DIAGNOSIS — Z880 Allergy status to penicillin: Secondary | ICD-10-CM | POA: Diagnosis not present

## 2015-07-15 DIAGNOSIS — I82409 Acute embolism and thrombosis of unspecified deep veins of unspecified lower extremity: Secondary | ICD-10-CM | POA: Diagnosis not present

## 2015-07-15 DIAGNOSIS — I10 Essential (primary) hypertension: Secondary | ICD-10-CM | POA: Diagnosis not present

## 2015-07-15 DIAGNOSIS — F419 Anxiety disorder, unspecified: Secondary | ICD-10-CM | POA: Diagnosis not present

## 2015-07-15 DIAGNOSIS — R279 Unspecified lack of coordination: Secondary | ICD-10-CM | POA: Diagnosis not present

## 2015-07-15 DIAGNOSIS — R627 Adult failure to thrive: Secondary | ICD-10-CM | POA: Diagnosis not present

## 2015-07-15 DIAGNOSIS — R4182 Altered mental status, unspecified: Secondary | ICD-10-CM | POA: Diagnosis present

## 2015-07-15 DIAGNOSIS — N3289 Other specified disorders of bladder: Secondary | ICD-10-CM | POA: Diagnosis not present

## 2015-07-15 DIAGNOSIS — F3130 Bipolar disorder, current episode depressed, mild or moderate severity, unspecified: Secondary | ICD-10-CM | POA: Diagnosis not present

## 2015-07-15 DIAGNOSIS — N183 Chronic kidney disease, stage 3 (moderate): Secondary | ICD-10-CM | POA: Diagnosis present

## 2015-07-15 DIAGNOSIS — N390 Urinary tract infection, site not specified: Secondary | ICD-10-CM | POA: Diagnosis not present

## 2015-07-15 DIAGNOSIS — I129 Hypertensive chronic kidney disease with stage 1 through stage 4 chronic kidney disease, or unspecified chronic kidney disease: Secondary | ICD-10-CM | POA: Diagnosis present

## 2015-07-15 DIAGNOSIS — J9811 Atelectasis: Secondary | ICD-10-CM | POA: Diagnosis not present

## 2015-07-15 DIAGNOSIS — F068 Other specified mental disorders due to known physiological condition: Secondary | ICD-10-CM | POA: Diagnosis not present

## 2015-07-15 DIAGNOSIS — G92 Toxic encephalopathy: Secondary | ICD-10-CM | POA: Diagnosis present

## 2015-07-15 DIAGNOSIS — R402441 Other coma, without documented Glasgow coma scale score, or with partial score reported, in the field [EMT or ambulance]: Secondary | ICD-10-CM | POA: Diagnosis not present

## 2015-07-15 DIAGNOSIS — Z833 Family history of diabetes mellitus: Secondary | ICD-10-CM

## 2015-07-15 DIAGNOSIS — Z743 Need for continuous supervision: Secondary | ICD-10-CM | POA: Diagnosis not present

## 2015-07-15 DIAGNOSIS — L89623 Pressure ulcer of left heel, stage 3: Secondary | ICD-10-CM | POA: Diagnosis not present

## 2015-07-15 DIAGNOSIS — D649 Anemia, unspecified: Secondary | ICD-10-CM | POA: Diagnosis not present

## 2015-07-15 DIAGNOSIS — N179 Acute kidney failure, unspecified: Secondary | ICD-10-CM | POA: Diagnosis not present

## 2015-07-15 DIAGNOSIS — N3090 Cystitis, unspecified without hematuria: Secondary | ICD-10-CM | POA: Diagnosis not present

## 2015-07-15 DIAGNOSIS — Z888 Allergy status to other drugs, medicaments and biological substances status: Secondary | ICD-10-CM

## 2015-07-15 DIAGNOSIS — F319 Bipolar disorder, unspecified: Secondary | ICD-10-CM | POA: Diagnosis present

## 2015-07-15 DIAGNOSIS — N189 Chronic kidney disease, unspecified: Secondary | ICD-10-CM

## 2015-07-15 DIAGNOSIS — E46 Unspecified protein-calorie malnutrition: Secondary | ICD-10-CM | POA: Diagnosis not present

## 2015-07-15 DIAGNOSIS — Z825 Family history of asthma and other chronic lower respiratory diseases: Secondary | ICD-10-CM | POA: Diagnosis not present

## 2015-07-15 DIAGNOSIS — L8990 Pressure ulcer of unspecified site, unspecified stage: Secondary | ICD-10-CM | POA: Diagnosis not present

## 2015-07-15 DIAGNOSIS — E860 Dehydration: Secondary | ICD-10-CM | POA: Diagnosis not present

## 2015-07-15 DIAGNOSIS — K219 Gastro-esophageal reflux disease without esophagitis: Secondary | ICD-10-CM | POA: Diagnosis present

## 2015-07-15 DIAGNOSIS — F0280 Dementia in other diseases classified elsewhere without behavioral disturbance: Secondary | ICD-10-CM | POA: Diagnosis not present

## 2015-07-15 DIAGNOSIS — M6281 Muscle weakness (generalized): Secondary | ICD-10-CM | POA: Diagnosis not present

## 2015-07-15 LAB — CBC WITH DIFFERENTIAL/PLATELET
BASOS ABS: 0 10*3/uL (ref 0.0–0.1)
Basophils Relative: 0 %
EOS ABS: 0.2 10*3/uL (ref 0.0–0.7)
EOS PCT: 2 %
HCT: 38.1 % (ref 36.0–46.0)
HEMOGLOBIN: 11.8 g/dL — AB (ref 12.0–15.0)
LYMPHS PCT: 13 %
Lymphs Abs: 1.1 10*3/uL (ref 0.7–4.0)
MCH: 30.3 pg (ref 26.0–34.0)
MCHC: 31 g/dL (ref 30.0–36.0)
MCV: 97.7 fL (ref 78.0–100.0)
Monocytes Absolute: 0.5 10*3/uL (ref 0.1–1.0)
Monocytes Relative: 6 %
NEUTROS PCT: 79 %
Neutro Abs: 6.3 10*3/uL (ref 1.7–7.7)
PLATELETS: 218 10*3/uL (ref 150–400)
RBC: 3.9 MIL/uL (ref 3.87–5.11)
RDW: 16.1 % — ABNORMAL HIGH (ref 11.5–15.5)
WBC: 8.2 10*3/uL (ref 4.0–10.5)

## 2015-07-15 LAB — URINALYSIS, ROUTINE W REFLEX MICROSCOPIC
Bilirubin Urine: NEGATIVE
Glucose, UA: NEGATIVE mg/dL
KETONES UR: NEGATIVE mg/dL
NITRITE: NEGATIVE
PH: 5.5 (ref 5.0–8.0)
Protein, ur: NEGATIVE mg/dL
SPECIFIC GRAVITY, URINE: 1.02 (ref 1.005–1.030)

## 2015-07-15 LAB — COMPREHENSIVE METABOLIC PANEL
ALT: 22 U/L (ref 14–54)
ANION GAP: 13 (ref 5–15)
AST: 40 U/L (ref 15–41)
Albumin: 3.4 g/dL — ABNORMAL LOW (ref 3.5–5.0)
Alkaline Phosphatase: 85 U/L (ref 38–126)
BUN: 35 mg/dL — ABNORMAL HIGH (ref 6–20)
CHLORIDE: 104 mmol/L (ref 101–111)
CO2: 24 mmol/L (ref 22–32)
Calcium: 10 mg/dL (ref 8.9–10.3)
Creatinine, Ser: 2.09 mg/dL — ABNORMAL HIGH (ref 0.44–1.00)
GFR calc Af Amer: 25 mL/min — ABNORMAL LOW (ref >60)
GFR calc non Af Amer: 22 mL/min — ABNORMAL LOW (ref >60)
Glucose, Bld: 104 mg/dL — ABNORMAL HIGH (ref 65–99)
Potassium: 3.7 mmol/L (ref 3.5–5.1)
SODIUM: 141 mmol/L (ref 135–145)
Total Bilirubin: 0.7 mg/dL (ref 0.3–1.2)
Total Protein: 7 g/dL (ref 6.5–8.1)

## 2015-07-15 LAB — CBC
HCT: 34.5 % — ABNORMAL LOW (ref 36.0–46.0)
Hemoglobin: 10.8 g/dL — ABNORMAL LOW (ref 12.0–15.0)
MCH: 30.6 pg (ref 26.0–34.0)
MCHC: 31.3 g/dL (ref 30.0–36.0)
MCV: 97.7 fL (ref 78.0–100.0)
PLATELETS: 193 10*3/uL (ref 150–400)
RBC: 3.53 MIL/uL — AB (ref 3.87–5.11)
RDW: 16 % — AB (ref 11.5–15.5)
WBC: 5.3 10*3/uL (ref 4.0–10.5)

## 2015-07-15 LAB — CREATININE, SERUM
Creatinine, Ser: 2.2 mg/dL — ABNORMAL HIGH (ref 0.44–1.00)
GFR calc non Af Amer: 21 mL/min — ABNORMAL LOW (ref >60)
GFR, EST AFRICAN AMERICAN: 24 mL/min — AB (ref >60)

## 2015-07-15 LAB — URINE MICROSCOPIC-ADD ON

## 2015-07-15 LAB — TROPONIN I

## 2015-07-15 LAB — TSH: TSH: 0.588 u[IU]/mL (ref 0.350–4.500)

## 2015-07-15 LAB — MRSA PCR SCREENING: MRSA by PCR: NEGATIVE

## 2015-07-15 MED ORDER — LAMOTRIGINE 100 MG PO TABS
100.0000 mg | ORAL_TABLET | Freq: Two times a day (BID) | ORAL | Status: DC
  Administered 2015-07-15 – 2015-07-18 (×7): 100 mg via ORAL
  Filled 2015-07-15 (×7): qty 1

## 2015-07-15 MED ORDER — RISPERIDONE 1 MG PO TABS
1.0000 mg | ORAL_TABLET | Freq: Every day | ORAL | Status: DC
  Administered 2015-07-15 – 2015-07-17 (×3): 1 mg via ORAL
  Filled 2015-07-15 (×3): qty 1

## 2015-07-15 MED ORDER — HEPARIN SODIUM (PORCINE) 5000 UNIT/ML IJ SOLN
5000.0000 [IU] | Freq: Three times a day (TID) | INTRAMUSCULAR | Status: DC
  Administered 2015-07-15 – 2015-07-18 (×11): 5000 [IU] via SUBCUTANEOUS
  Filled 2015-07-15 (×11): qty 1

## 2015-07-15 MED ORDER — SENNA 8.6 MG PO TABS
1.0000 | ORAL_TABLET | Freq: Two times a day (BID) | ORAL | Status: DC
  Administered 2015-07-15 – 2015-07-18 (×7): 8.6 mg via ORAL
  Filled 2015-07-15 (×7): qty 1

## 2015-07-15 MED ORDER — ACETAMINOPHEN 650 MG RE SUPP: 650.0000 mg | Freq: Four times a day (QID) | RECTAL | Status: DC | PRN

## 2015-07-15 MED ORDER — ASPIRIN EC 81 MG PO TBEC
81.0000 mg | DELAYED_RELEASE_TABLET | ORAL | Status: DC
  Administered 2015-07-15 – 2015-07-18 (×4): 81 mg via ORAL
  Filled 2015-07-15 (×4): qty 1

## 2015-07-15 MED ORDER — ALBUTEROL SULFATE (2.5 MG/3ML) 0.083% IN NEBU
2.5000 mg | INHALATION_SOLUTION | Freq: Two times a day (BID) | RESPIRATORY_TRACT | Status: DC
  Administered 2015-07-15 – 2015-07-18 (×6): 2.5 mg via RESPIRATORY_TRACT
  Filled 2015-07-15 (×7): qty 3

## 2015-07-15 MED ORDER — SACCHAROMYCES BOULARDII 250 MG PO CAPS
250.0000 mg | ORAL_CAPSULE | Freq: Two times a day (BID) | ORAL | Status: DC
  Administered 2015-07-15 – 2015-07-18 (×7): 250 mg via ORAL
  Filled 2015-07-15 (×7): qty 1

## 2015-07-15 MED ORDER — DEXTROSE-NACL 5-0.9 % IV SOLN
INTRAVENOUS | Status: DC
Start: 2015-07-15 — End: 2015-07-18
  Administered 2015-07-15 – 2015-07-18 (×4): via INTRAVENOUS

## 2015-07-15 MED ORDER — PAROXETINE HCL 20 MG PO TABS
40.0000 mg | ORAL_TABLET | Freq: Every day | ORAL | Status: DC
  Administered 2015-07-15 – 2015-07-17 (×3): 40 mg via ORAL
  Filled 2015-07-15 (×3): qty 2

## 2015-07-15 MED ORDER — DONEPEZIL HCL 5 MG PO TABS
5.0000 mg | ORAL_TABLET | Freq: Every day | ORAL | Status: DC
  Administered 2015-07-15 – 2015-07-17 (×3): 5 mg via ORAL
  Filled 2015-07-15 (×3): qty 1

## 2015-07-15 MED ORDER — SULFAMETHOXAZOLE-TRIMETHOPRIM 400-80 MG/5ML IV SOLN
160.0000 mg | Freq: Two times a day (BID) | INTRAVENOUS | Status: DC
  Administered 2015-07-15 – 2015-07-18 (×6): 160 mg via INTRAVENOUS
  Filled 2015-07-15 (×7): qty 10

## 2015-07-15 MED ORDER — RISPERIDONE 0.5 MG PO TABS
0.2500 mg | ORAL_TABLET | Freq: Three times a day (TID) | ORAL | Status: DC
  Administered 2015-07-15 – 2015-07-18 (×10): 0.25 mg via ORAL
  Filled 2015-07-15 (×10): qty 1

## 2015-07-15 MED ORDER — BUPROPION HCL ER (XL) 300 MG PO TB24
300.0000 mg | ORAL_TABLET | ORAL | Status: DC
  Administered 2015-07-15 – 2015-07-18 (×4): 300 mg via ORAL
  Filled 2015-07-15 (×5): qty 1

## 2015-07-15 MED ORDER — ONDANSETRON HCL 4 MG PO TABS
4.0000 mg | ORAL_TABLET | Freq: Three times a day (TID) | ORAL | Status: DC | PRN
  Administered 2015-07-15: 4 mg via ORAL
  Filled 2015-07-15: qty 1

## 2015-07-15 MED ORDER — SULFAMETHOXAZOLE-TRIMETHOPRIM 400-80 MG/5ML IV SOLN
160.0000 mg | Freq: Two times a day (BID) | INTRAVENOUS | Status: DC
  Filled 2015-07-15 (×4): qty 10

## 2015-07-15 MED ORDER — BOOST HIGH PROTEIN PO LIQD
120.0000 mL | Freq: Two times a day (BID) | ORAL | Status: DC
  Administered 2015-07-15 – 2015-07-18 (×6): 120 mL via ORAL
  Filled 2015-07-15 (×9): qty 237

## 2015-07-15 MED ORDER — PRO-STAT SUGAR FREE PO LIQD
30.0000 mL | Freq: Two times a day (BID) | ORAL | Status: DC
Start: 2015-07-15 — End: 2015-07-18
  Administered 2015-07-15 – 2015-07-18 (×6): 30 mL via ORAL
  Filled 2015-07-15 (×6): qty 30

## 2015-07-15 MED ORDER — SULFAMETHOXAZOLE-TRIMETHOPRIM 400-80 MG/5ML IV SOLN
INTRAVENOUS | Status: AC
  Filled 2015-07-15: qty 1

## 2015-07-15 MED ORDER — SULFAMETHOXAZOLE-TRIMETHOPRIM 800-160 MG PO TABS: 1.0000 | ORAL_TABLET | Freq: Once | ORAL | Status: DC

## 2015-07-15 MED ORDER — ADULT MULTIVITAMIN W/MINERALS CH
1.0000 | ORAL_TABLET | Freq: Every day | ORAL | Status: DC
  Administered 2015-07-15 – 2015-07-18 (×3): 1 via ORAL
  Filled 2015-07-15 (×4): qty 1

## 2015-07-15 MED ORDER — BENZTROPINE MESYLATE 1 MG PO TABS
0.5000 mg | ORAL_TABLET | Freq: Two times a day (BID) | ORAL | Status: DC
  Administered 2015-07-15 – 2015-07-18 (×7): 0.5 mg via ORAL
  Filled 2015-07-15 (×7): qty 1

## 2015-07-15 MED ORDER — OXYBUTYNIN CHLORIDE ER 5 MG PO TB24
10.0000 mg | ORAL_TABLET | Freq: Every day | ORAL | Status: DC
  Administered 2015-07-15 – 2015-07-18 (×4): 10 mg via ORAL
  Filled 2015-07-15 (×4): qty 2

## 2015-07-15 MED ORDER — ACETAMINOPHEN 325 MG PO TABS: 650.0000 mg | ORAL_TABLET | Freq: Four times a day (QID) | ORAL | Status: DC | PRN

## 2015-07-15 NOTE — ED Notes (Signed)
Pt from [facility] nursing home.  Per nurse from facility, pt "just wasn't responding like her normal self".  Staff from [facility] said that pt has "been more confused the last few days and just hasn't gotten better".  Pt has history of dementia and is unable to provide history.

## 2015-07-15 NOTE — Progress Notes (Signed)
Patient arrived to unit from Emergency Department. Unable to do admission due to patient having Dementia. Arrived to hospital via EMS from [facility].

## 2015-07-15 NOTE — ED Provider Notes (Signed)
CSN: [id number]     Arrival date & time 07/15/15  0156 History   First MD Initiated Contact with Patient 07/15/15 0214     Chief Complaint  Patient presents with  . Altered Mental Status     (Consider location/radiation/quality/duration/timing/severity/associated sxs/prior Treatment) HPI Comments: 75 old female with dementia, nursing home living, renal failure, high blood pressure, admission for cellulitis presents with worsening confusion and generalized for the past 2-3 days. No fevers no head injury witness. No new medications noted. No family at bedside. Patient mild worse than baseline.  Patient is a 75 y.o. female presenting with altered mental status. The history is provided by medical records and the nursing home.  Altered Mental Status   Past Medical History  Diagnosis Date  . High blood pressure   . Arthritis   . Depression   . Anxiety   . Hemorrhoids   . Hyperlipidemia   . Bipolar 1 disorder (HCC)   . Dysphagia   . Altered mental status   . Localized edema   . Weakness   . GERD (gastroesophageal reflux disease)   . Chronic kidney disease   . Dementia   . Hypoglycemia   . Thrombocytopenia (HCC)   . Osteoarthritis    Past Surgical History  Procedure Laterality Date  . Back surgery  03/16/87  . Cholecystectomy  08/15/93  . Partial hysterectomy  04/04/95  . Bladder tacked  04/04/95  . Ankle surgery  04/28/97    right ankle fracture  . Wrist surgery  02/24/09    right wrist fracture  . Colonoscopy  10/26/2002    Dr. Karilyn Cota- small external hemorrhoids otherwise normal   . Breast biopsy  05/16/95    right side  . Savory dilation  10/31/2011    Procedure: SAVORY DILATION;  Surgeon: Corbin Ade, MD;  Location: [facility];  Service: Endoscopy;  Laterality: N/A;  Elease Hashimoto dilation  10/31/2011    Procedure: Elease Hashimoto DILATION;  Surgeon: Corbin Ade, MD;  Location: [facility];  Service: Endoscopy;  Laterality: N/A;  . Abdominal hysterectomy     Family History   Problem Relation Age of Onset  . Arthritis    . Asthma    . Diabetes    . Colon cancer Neg Hx   . Anxiety disorder Paternal Aunt   . Anxiety disorder Paternal Uncle   . Bipolar disorder Cousin    Social History  Substance Use Topics  . Smoking status: Never Smoker   . Smokeless tobacco: Never Used  . Alcohol Use: No   OB History    Gravida Para Term Preterm AB TAB SAB Ectopic Multiple Living            0     Review of Systems  Unable to perform ROS: Dementia      Allergies  Benzodiazepines; Ciprofloxacin; Cephalexin; and Penicillins  Home Medications   Prior to Admission medications   Medication Sig Start Date End Date Taking? Authorizing Provider  acetaminophen (TYLENOL) 500 MG tablet Take 1,000 mg by mouth every 6 (six) hours as needed for pain.    Historical Provider, MD  acetaminophen (TYLENOL) 650 MG CR tablet Take 650 mg by mouth every 6 (six) hours as needed for pain.    Historical Provider, MD  albuterol (PROVENTIL) (2.5 MG/3ML) 0.083% nebulizer solution Take 2.5 mg by nebulization 2 (two) times daily.    Historical Provider, MD  Amino Acids-Protein Hydrolys (FEEDING SUPPLEMENT, PRO-STAT SUGAR FREE 64,) LIQD Take 30 mLs  by mouth 2 (two) times daily.    Historical Provider, MD  aspirin EC 81 MG tablet Take 81 mg by mouth every morning.     Historical Provider, MD  benztropine (COGENTIN) 0.5 MG tablet Take 1 tablet (0.5 mg total) by mouth 2 (two) times daily. 05/30/15 05/29/16  Myrlene Broker, MD  bisacodyl (DULCOLAX) 10 MG suppository Place 10 mg rectally every 8 (eight) hours as needed for moderate constipation.    Historical Provider, MD  buPROPion (WELLBUTRIN XL) 300 MG 24 hr tablet Take 1 tablet (300 mg total) by mouth every morning. 05/30/15 05/29/16  Myrlene Broker, MD  Calcium Carb-Vit D-C-E-Mineral (OS-CAL ULTRA) 600 MG TABS Take by mouth 2 (two) times daily.    Historical Provider, MD  Cholecalciferol (VITAMIN D) 2000 UNITS CAPS Take 1 capsule by mouth every  morning.     Historical Provider, MD  clindamycin (CLEOCIN) 300 MG capsule Take 1 capsule (300 mg total) by mouth 3 (three) times daily. 03/27/15   Jerald Kief, MD  clonazePAM Scarlette Calico) 0.5 MG tablet Take daily at 5 pm 05/30/15   Myrlene Broker, MD  cloNIDine (CATAPRES) 0.1 MG tablet Take 0.1 mg by mouth as needed.    Historical Provider, MD  docusate sodium (STOOL SOFTENER) 100 MG capsule Take 200 mg by mouth at bedtime.    Historical Provider, MD  donepezil (ARICEPT) 5 MG tablet Take 1 tablet (5 mg total) by mouth at bedtime. 05/30/15 05/29/16  Myrlene Broker, MD  enoxaparin (LOVENOX) 150 MG/ML injection Inject 0.7 mLs (105 mg total) into the skin daily. 03/28/15   Jerald Kief, MD  furosemide (LASIX) 20 MG tablet Take 20 mg by mouth every Monday, Wednesday, and Friday.  01/26/14   Historical Provider, MD  lamoTRIgine (LAMICTAL) 100 MG tablet Take 1 tablet (100 mg total) by mouth 2 (two) times daily. 05/30/15   Myrlene Broker, MD  lisinopril (PRINIVIL,ZESTRIL) 2.5 MG tablet Take 2.5 mg by mouth daily.    Historical Provider, MD  loratadine (CLARITIN) 10 MG tablet Take 10 mg by mouth every 12 (twelve) hours as needed for allergies.     Historical Provider, MD  Multiple Vitamin (MULTIVITAMIN) capsule Take 1 capsule by mouth daily.    Historical Provider, MD  multivitamin (PROSIGHT) TABS tablet Take 1 tablet by mouth daily.    Historical Provider, MD  Nutritional Supplements (RESOURCE 2.0) LIQD Take 120 mLs by mouth 2 (two) times daily.    Historical Provider, MD  omeprazole (PRILOSEC) 40 MG capsule Take 40 mg by mouth daily.    Historical Provider, MD  ondansetron (ZOFRAN) 4 MG tablet Take 4 mg by mouth every 8 (eight) hours as needed for nausea or vomiting.    Historical Provider, MD  oxybutynin (DITROPAN-XL) 10 MG 24 hr tablet Take 10 mg by mouth daily.     Historical Provider, MD  PARoxetine (PAXIL) 40 MG tablet Take 1 tablet (40 mg total) by mouth at bedtime. 05/30/15 05/29/16  Myrlene Broker, MD   polyethylene glycol [facility] Ethelene Hal) packet Take 17 g by mouth daily.     Historical Provider, MD  risperiDONE (RISPERDAL) 0.25 MG tablet Take 1 tablet (0.25 mg total) by mouth 3 (three) times daily. Takes at 8:00AM, 2:00PM, 5:00PM. 05/30/15   Myrlene Broker, MD  risperiDONE (RISPERDAL) 1 MG tablet Take 1 tablet (1 mg total) by mouth at bedtime. 05/30/15   Myrlene Broker, MD  saccharomyces boulardii (FLORASTOR) 250 MG capsule Take 1  capsule (250 mg total) by mouth 2 (two) times daily. 03/27/15   Jerald KiefStephen K Chiu, MD  senna (SENOKOT) 8.6 MG TABS tablet Take 1 tablet by mouth 2 (two) times daily.    Historical Provider, MD  terazosin (HYTRIN) 2 MG capsule Take 2 mg by mouth at bedtime.     Historical Provider, MD  warfarin (COUMADIN) 5 MG tablet Take 1 tablet (5 mg total) by mouth once. 03/27/15   Jerald KiefStephen K Chiu, MD   BP 111/62 mmHg  Pulse 80  Temp(Src) 98.1 F (36.7 C) (Oral)  Resp 10  Ht 5\' 6"  (1.676 m)  Wt 253 lb (114.76 kg)  BMI 40.85 kg/m2  SpO2 100% Physical Exam  Constitutional: She appears well-developed. No distress.  HENT:  Head: Normocephalic and atraumatic.  Eyes: Right eye exhibits no discharge. Left eye exhibits no discharge.  Neck: Neck supple. No tracheal deviation present.  Cardiovascular: Normal rate and regular rhythm.   Pulmonary/Chest: Effort normal and breath sounds normal.  Abdominal: Soft. She exhibits no distension. There is no tenderness. There is no guarding.  Musculoskeletal: She exhibits no edema.  Neurological: She is alert. GCS eye subscore is 4. GCS verbal subscore is 4. GCS motor subscore is 6.  Pleasant dementia. Moves all extremities in isolation with 5+ strength. Difficulty following some commands. Pupils equal, horizontal eye movements intact. Gross sensation intact pain bilateral.  Skin: Skin is warm. No rash noted.  Psychiatric: She has a normal mood and affect.  Nursing note and vitals reviewed.   ED Course  Procedures (including critical  care time) Labs Review Labs Reviewed  URINALYSIS, ROUTINE W REFLEX MICROSCOPIC (NOT AT [facility]) - Abnormal; Notable for the following:    APPearance CLOUDY (*)    Hgb urine dipstick TRACE (*)    Leukocytes, UA MODERATE (*)    All other components within normal limits  URINE MICROSCOPIC-ADD ON - Abnormal; Notable for the following:    Squamous Epithelial / LPF 0-5 (*)    Bacteria, UA MANY (*)    All other components within normal limits  URINE CULTURE  CBC WITH DIFFERENTIAL/PLATELET  COMPREHENSIVE METABOLIC PANEL  TROPONIN I    Imaging Review Dg Chest 2 View  07/15/2015  CLINICAL DATA:  75 year old female with increased confusion and altered mental status. EXAM: CHEST  2 VIEW COMPARISON:  Radiograph dated 08/19/2014 and 04/28/2014 FINDINGS: Two views of the chest demonstrate mild increased linear and hazy density at the left lung base, likely atelectatic changes. Developing pneumonia is less likely but not excluded. Clinical correlation is recommended. There is no focal consolidation, pleural effusion, or pneumothorax. There is mild eventration of the right hemidiaphragm similar to prior study. The cardiac silhouette is within normal limits. No acute osseous pathology. IMPRESSION: Left lung base subsegmental atelectatic changes. Developing pneumonia is not excluded. Electronically Signed   By: Elgie CollardArash  Radparvar M.D.   On: 07/15/2015 03:00   Ct Head Wo Contrast  07/15/2015  CLINICAL DATA:  Confusion for 3 days, not improving. Baseline dementia. EXAM: CT HEAD WITHOUT CONTRAST TECHNIQUE: Contiguous axial images were obtained from the base of the skull through the vertex without intravenous contrast. COMPARISON:  CT head August 19, 2014 FINDINGS: Mildly motion degraded examination. Moderate to severe ventriculomegaly on the basis of global parenchymal brain volume loss, similar to prior examination. No intraparenchymal hemorrhage, mass effect nor midline shift. Patchy to confluent supratentorial white  matter hypodensities. No acute large vascular territory infarcts. No abnormal extra-axial fluid collections. Basal cisterns are patent. Moderate  calcific atherosclerosis of the carotid siphons. No skull fracture. The included ocular globes and orbital contents are non-suspicious. Status post LEFT ocular lens implant. The mastoid aircells and included paranasal sinuses are well-aerated. Patient is edentulous. IMPRESSION: No acute intracranial process. Moderate to severe global parenchymal brain volume loss with moderate to severe chronic small vessel ischemic disease. Electronically Signed   By: Awilda Metro M.D.   On: 07/15/2015 03:17   I have personally reviewed and evaluated these images and lab results as part of my medical decision-making.   EKG Interpretation   Date/Time:  Saturday July 15 2015 02:27:05 EST Ventricular Rate:  78 PR Interval:    QRS Duration: 103 QT Interval:  433 QTC Calculation: 493 R Axis:   5 Text Interpretation:  Accelerated junctional rhythm Borderline prolonged  QT interval Confirmed by Persephonie Hegwood  MD, Briaunna Grindstaff ([id number]) on 07/15/2015 3:07:01 AM      MDM   Final diagnoses:  None   Patient presents with mild worsening mental status, global confusion. CT scan head no acute findings, urinalysis consistent with infection. Patient has multiple different allergies. Plan for antibiotics.  Family arrived patient not at her baseline. Discussed with [facility] for observation and IV antibiotics until improvement in clinical status.  The patients results and plan were reviewed and discussed.   Any x-rays performed were independently reviewed by myself.   Differential diagnosis were considered with the presenting HPI.  Medications  sulfamethoxazole-trimethoprim (BACTRIM) 160 mg in dextrose 5 % 250 mL IVPB (not administered)    Filed Vitals:   07/15/15 0205 07/15/15 0230 07/15/15 0330  BP: 104/70 110/66 111/62  Pulse: 80 80   Temp: 98.1 F (36.7 C)     TempSrc: Oral    Resp: Height:  (1.676 m)    Weight: 253 lb (114.76 kg)    SpO2: 100% 100%     Final diagnoses:  UTI (lower urinary tract infection)  Dementia, without behavioral disturbance  Confusion    Admission/ observation were discussed with the admitting physician, patient and/or family and they are comfortable with the plan.      Blane Ohara, MD 07/15/15 [phone]

## 2015-07-15 NOTE — Progress Notes (Signed)
Amy BoundFrances Clay MVH:[id number] DOB: November 27, 1940 DOA: 07/15/2015 PCP: Colette RibasGOLDING, [name], MD  Brief narrative: 75 year old female resident of [facility] assisted [facility] Recent diagnosis right lower extremity DVT 03/24/15 + cellulitis Prior pressure ulcer stage III at that time Moderate dementia Hypertension Bipolar in the setting of dementia with suicidality endorsement the past as well as admission for this in the 90s. History of admission for anabolic encephalopathy back in 08/2014 and hypothermia as well as hypoglycemia History of baseline CTD stage II with creatinine 1.5 prior episodic presyncope and echocardiogram at the time/2016 showed technically adequate study with EF 60-65%   Admitted from the emergency room with increasing lethargy, decreased appetite, CT head no acute process chest x-ray atelectasis and creatinine 2.0 UA was concerning for pyelonephritiswith moderate leukocytes but negative nitrites and patient was admitted for metabolic encephalopathy confusion and possible urinary infection  Past medical history-As per Problem list Chart reviewed as below-   Consultants:  None  Procedures:  None  Antibiotics:  None   Subjective   The patient is disoriented and cannot give proper review of systems but can't tell me her name Review of systems is unreliable   Objective    Interim History:   Telemetry: S1-S2 no murmur rub or gallop   Objective: Filed Vitals:   07/15/15 0230 07/15/15 0330 07/15/15 0430 07/15/15 0539  BP: 110/66 111/62 113/64 111/65  Pulse: 80   75  Temp:    97.8 F (36.6 C)  TempSrc:    Oral  Resp: 17 10 11 16   Height:      Weight:    79.153 kg (174 lb 8 oz)  SpO2: 100%   96%   No intake or output data in the 24 hours ending 07/15/15 0745  Exam:  General: Alert but disoriented  Cardiovascular: S1-S2 no murmur rub or gallop Respiratory: Clinically clear no added sound Abdomen:  Soft nontender nondistended no rebound or  guarding Skin no lower extremity edema Neuro confused  Data Reviewed: Basic Metabolic Panel:  Recent Labs Lab 07/15/15 0305 07/15/15 0634  NA 141  --   K 3.7  --   CL 104  --   CO2 24  --   GLUCOSE 104*  --   BUN 35*  --   CREATININE 2.09* 2.20*  CALCIUM 10.0  --    Liver Function Tests:  Recent Labs Lab 07/15/15 0305  AST 40  ALT 22  ALKPHOS 85  BILITOT 0.7  PROT 7.0  ALBUMIN 3.4*   No results for input(s): LIPASE, AMYLASE in the last 168 hours. No results for input(s): AMMONIA in the last 168 hours. CBC:  Recent Labs Lab 07/15/15 0305 07/15/15 0634  WBC 8.2 5.3  NEUTROABS 6.3  --   HGB 11.8* 10.8*  HCT 38.1 34.5*  MCV 97.7 97.7  PLT 218 193   Cardiac Enzymes:  Recent Labs Lab 07/15/15 0305  TROPONINI <0.03   BNP: Invalid input(s): POCBNP CBG: No results for input(s): GLUCAP in the last 168 hours.  No results found for this or any previous visit (from the past 240 hour(s)).   Studies:              All Imaging reviewed and is as per above notation   Scheduled Meds: . albuterol  2.5 mg Nebulization BID  . aspirin EC  81 mg Oral BH-q7a  . benztropine  0.5 mg Oral BID  . buPROPion  300 mg Oral BH-q7a  . donepezil  5 mg Oral QHS  .  feeding supplement  120 mL Oral BID  . feeding supplement (PRO-STAT SUGAR FREE 64)  30 mL Oral BID  . heparin  5,000 Units Subcutaneous 3 times per day  . lamoTRIgine  100 mg Oral BID  . multivitamin with minerals  1 tablet Oral Daily  . oxybutynin  10 mg Oral Daily  . PARoxetine  40 mg Oral QHS  . risperiDONE  0.25 mg Oral TID  . risperiDONE  1 mg Oral QHS  . saccharomyces boulardii  250 mg Oral BID  . senna  1 tablet Oral BID  . sulfamethoxazole-trimethoprim  160 mg Intravenous Q12H   Continuous Infusions: . dextrose 5 % and 0.9% NaCl 50 mL/hr at 07/15/15 0548     Assessment/Plan:  1. Toxic metabolic encephalopathy probably secondary to multiple reasons inclusive of potential cystitis versus acute  kidney injury see below 2. ? Cystitis-treat with Bactrim DS IV for 24 hours. I'm not sure how the urine was obtained but she does not have a catheter so this may just be asymptomatic bacteriuria. I would complete 3 days and follow urine culture although not sure how to interpret 3. Acute kidney injury baseline GFR around 40 03/2015 on admission was 24. Continue D5 normal saline 50 cc/h and increase rate to 75 cc per hour for 24 hours and repeat labs in a.m. 4. History of hypertension-at present time we will hold prior to admission lisinopril 2.5 daily, Lasix 20 every other day Monday Wednesday Friday, terazosin 2 mg.  Would not add back ACE or HCTZ for blood pressure control as an outpatient. May resume clonidine 0.1 PTA this admission if blood pressure is elevated 5. EF 6065% without mention of diastolic dysfunction-stable 6. Moderate to severe dementia would continue paroxetine 40 daily, Risperdal 0.25 3 times a day in addition to 1 daily at bedtime,  Lamictal 100 twice a day 7. Depression-continue bupropion 300 daily at bedtime, benztropine 0.5 daily for EPS   DNR Called to Discuss with him Shreya, [place] [phone]  [phone]  Inpatient   Pleas Koch, MD  [facility] Pager [phone] 07/15/2015, 7:45 AM

## 2015-07-15 NOTE — H&P (Signed)
[facility] History and Physical  Amy Clay RUE:[id number] DOB: 05/05/1941    PCP:   Colette Ribas, MD   Chief Complaint: altered mental status on baseline dementia admitted for UTI and AKI.   HPI: Amy Clay is an 75 y.o. female with multiple antibiotic allergies and CKD 3, hx of dementia, SNF resident with DNR code status, hx of DVT one year ago, still on Coumadin,  Mood disorder, HTN, HLD, anxiety and depression, brought to the ER as she has been more lethargic and having altered mental status than her baseline.  She has not been eating much.  Evaluation in the ER showed head CT with no acute process, and CXR showed basilar atelectasis.  She has a Cr of 2.0 with no leukocytosis and unremarkable Hb.  Her UA was positive for a UTI.  Given her allergies, she was given IV Bactrim, adjusted for renal insufficiency, and hospitalist was asked to admit her for AMS due to UTI, with dehydration.   Rewiew of Systems: Unable.   Past Medical History  Diagnosis Date  . High blood pressure   . Arthritis   . Depression   . Anxiety   . Hemorrhoids   . Hyperlipidemia   . Bipolar 1 disorder (HCC)   . Dysphagia   . Altered mental status   . Localized edema   . Weakness   . GERD (gastroesophageal reflux disease)   . Chronic kidney disease   . Dementia   . Hypoglycemia   . Thrombocytopenia (HCC)   . Osteoarthritis     Past Surgical History  Procedure Laterality Date  . Back surgery  03/16/87  . Cholecystectomy  08/15/93  . Partial hysterectomy  04/04/95  . Bladder tacked  04/04/95  . Ankle surgery  04/28/97    right ankle fracture  . Wrist surgery  02/24/09    right wrist fracture  . Colonoscopy  10/26/2002    Dr. Karilyn Cota- small external hemorrhoids otherwise normal   . Breast biopsy  05/16/95    right side  . Savory dilation  10/31/2011    Procedure: SAVORY DILATION;  Surgeon: Corbin Ade, MD;  Location: [facility];  Service: Endoscopy;  Laterality: N/A;  Elease Hashimoto dilation  10/31/2011    Procedure: Elease Hashimoto DILATION;  Surgeon: Corbin Ade, MD;  Location: [facility];  Service: Endoscopy;  Laterality: N/A;  . Abdominal hysterectomy      Medications:  HOME MEDS: Prior to Admission medications   Medication Sig Start Date End Date Taking? Authorizing Provider  acetaminophen (TYLENOL) 500 MG tablet Take 1,000 mg by mouth every 6 (six) hours as needed for pain.    Historical Provider, MD  acetaminophen (TYLENOL) 650 MG CR tablet Take 650 mg by mouth every 6 (six) hours as needed for pain.    Historical Provider, MD  albuterol (PROVENTIL) (2.5 MG/3ML) 0.083% nebulizer solution Take 2.5 mg by nebulization 2 (two) times daily.    Historical Provider, MD  Amino Acids-Protein Hydrolys (FEEDING SUPPLEMENT, PRO-STAT SUGAR FREE 64,) LIQD Take 30 mLs by mouth 2 (two) times daily.    Historical Provider, MD  aspirin EC 81 MG tablet Take 81 mg by mouth every morning.     Historical Provider, MD  benztropine (COGENTIN) 0.5 MG tablet Take 1 tablet (0.5 mg total) by mouth 2 (two) times daily. 05/30/15 05/29/16  Myrlene Broker, MD  bisacodyl (DULCOLAX) 10 MG suppository Place 10 mg rectally every 8 (eight) hours as needed for moderate constipation.  Historical Provider, MD  buPROPion (WELLBUTRIN XL) 300 MG 24 hr tablet Take 1 tablet (300 mg total) by mouth every morning. 05/30/15 05/29/16  Myrlene Broker, MD  Calcium Carb-Vit D-C-E-Mineral (OS-CAL ULTRA) 600 MG TABS Take by mouth 2 (two) times daily.    Historical Provider, MD  Cholecalciferol (VITAMIN D) 2000 UNITS CAPS Take 1 capsule by mouth every morning.     Historical Provider, MD  clindamycin (CLEOCIN) 300 MG capsule Take 1 capsule (300 mg total) by mouth 3 (three) times daily. 03/27/15   Jerald Kief, MD  clonazePAM Scarlette Calico) 0.5 MG tablet Take daily at 5 pm 05/30/15   Myrlene Broker, MD  cloNIDine (CATAPRES) 0.1 MG tablet Take 0.1 mg by mouth as needed.    Historical Provider, MD  docusate sodium (STOOL  SOFTENER) 100 MG capsule Take 200 mg by mouth at bedtime.    Historical Provider, MD  donepezil (ARICEPT) 5 MG tablet Take 1 tablet (5 mg total) by mouth at bedtime. 05/30/15 05/29/16  Myrlene Broker, MD  enoxaparin (LOVENOX) 150 MG/ML injection Inject 0.7 mLs (105 mg total) into the skin daily. 03/28/15   Jerald Kief, MD  furosemide (LASIX) 20 MG tablet Take 20 mg by mouth every Monday, Wednesday, and Friday.  01/26/14   Historical Provider, MD  lamoTRIgine (LAMICTAL) 100 MG tablet Take 1 tablet (100 mg total) by mouth 2 (two) times daily. 05/30/15   Myrlene Broker, MD  lisinopril (PRINIVIL,ZESTRIL) 2.5 MG tablet Take 2.5 mg by mouth daily.    Historical Provider, MD  loratadine (CLARITIN) 10 MG tablet Take 10 mg by mouth every 12 (twelve) hours as needed for allergies.     Historical Provider, MD  Multiple Vitamin (MULTIVITAMIN) capsule Take 1 capsule by mouth daily.    Historical Provider, MD  multivitamin (PROSIGHT) TABS tablet Take 1 tablet by mouth daily.    Historical Provider, MD  Nutritional Supplements (RESOURCE 2.0) LIQD Take 120 mLs by mouth 2 (two) times daily.    Historical Provider, MD  omeprazole (PRILOSEC) 40 MG capsule Take 40 mg by mouth daily.    Historical Provider, MD  ondansetron (ZOFRAN) 4 MG tablet Take 4 mg by mouth every 8 (eight) hours as needed for nausea or vomiting.    Historical Provider, MD  oxybutynin (DITROPAN-XL) 10 MG 24 hr tablet Take 10 mg by mouth daily.     Historical Provider, MD  PARoxetine (PAXIL) 40 MG tablet Take 1 tablet (40 mg total) by mouth at bedtime. 05/30/15 05/29/16  Myrlene Broker, MD  polyethylene glycol [facility] Ethelene Hal) packet Take 17 g by mouth daily.     Historical Provider, MD  risperiDONE (RISPERDAL) 0.25 MG tablet Take 1 tablet (0.25 mg total) by mouth 3 (three) times daily. Takes at 8:00AM, 2:00PM, 5:00PM. 05/30/15   Myrlene Broker, MD  risperiDONE (RISPERDAL) 1 MG tablet Take 1 tablet (1 mg total) by mouth at bedtime. 05/30/15   Myrlene Broker, MD  saccharomyces boulardii (FLORASTOR) 250 MG capsule Take 1 capsule (250 mg total) by mouth 2 (two) times daily. 03/27/15   Jerald Kief, MD  senna (SENOKOT) 8.6 MG TABS tablet Take 1 tablet by mouth 2 (two) times daily.    Historical Provider, MD  terazosin (HYTRIN) 2 MG capsule Take 2 mg by mouth at bedtime.     Historical Provider, MD  warfarin (COUMADIN) 5 MG tablet Take 1 tablet (5 mg total) by mouth once. 03/27/15   Scheryl Marten  Rhona Leavens, MD     Allergies:  Allergies  Allergen Reactions  . Benzodiazepines Other (See Comments)    Memory problems on Ativan, which goes away when she is off of it.   . Ciprofloxacin     Rash at site of injection with itching  . Cephalexin Rash and Other (See Comments)    Blistering of the mouth  . Penicillins Rash    Social History:   reports that she has never smoked. She has never used smokeless tobacco. She reports that she does not drink alcohol or use illicit drugs.  Family History: Family History  Problem Relation Age of Onset  . Arthritis    . Asthma    . Diabetes    . Colon cancer Neg Hx   . Anxiety disorder Paternal Aunt   . Anxiety disorder Paternal Uncle   . Bipolar disorder Cousin      Physical Exam: Filed Vitals:   07/15/15 0205 07/15/15 0230 07/15/15 0330  BP: 104/70 110/66 111/62  Pulse: 80 80   Temp: 98.1 F (36.7 C)    TempSrc: Oral    Resp: Height:  (1.676 m)    Weight: 114.76 kg (253 lb)    SpO2: 100% 100%    Blood pressure 111/62, pulse 80, temperature 98.1 F (36.7 C), temperature source Oral, resp. rate 10, height  (1.676 m), weight 114.76 kg (253 lb), SpO2 100 %.  GEN:  Pleasant  patient lying in the stretcher in no acute distress; cooperative with exam. But lethargic.  PSYCH:  alert and oriented x2; does not appear anxious or depressed; affect is appropriate. HEENT: Mucous membranes pink and anicteric; PERRLA; EOM intact; no cervical lymphadenopathy nor thyromegaly or carotid bruit;  no JVD; There were no stridor. Neck is very supple. Breasts:: Not examined CHEST WALL: No tenderness CHEST: Normal respiration, clear to auscultation bilaterally.  HEART: Regular rate and rhythm.  There are no murmur, rub, or gallops.   BACK: No kyphosis or scoliosis; no CVA tenderness ABDOMEN: soft and non-tender; no masses, no organomegaly, normal abdominal bowel sounds; no pannus; no intertriginous candida. There is no rebound and no distention. Rectal Exam: Not done EXTREMITIES: No bone or joint deformity; age-appropriate arthropathy of the hands and knees; no edema; no ulcerations.  There is no calf tenderness. Genitalia: not examined PULSES: 2+ and symmetric SKIN: Normal hydration no rash or ulceration CNS: Cranial nerves 2-12 grossly intact no focal lateralizing neurologic deficit.  Speech is fluent; uvula elevated with phonation, facial symmetry and tongue midline. DTR are normal bilaterally, cerebella exam is intact, barbinski is negative and strengths are equaled bilaterally.  No sensory loss.   Labs on Admission:  Basic Metabolic Panel:  Recent Labs Lab 07/15/15 0305  NA 141  K 3.7  CL 104  CO2 24  GLUCOSE 104*  BUN 35*  CREATININE 2.09*  CALCIUM 10.0   Liver Function Tests:  Recent Labs Lab 07/15/15 0305  AST 40  ALT 22  ALKPHOS 85  BILITOT 0.7  PROT 7.0  ALBUMIN 3.4*   CBC:  Recent Labs Lab 07/15/15 0305  WBC 8.2  NEUTROABS 6.3  HGB 11.8*  HCT 38.1  MCV 97.7  PLT 218   Cardiac Enzymes:  Recent Labs Lab 07/15/15 0305  TROPONINI <0.03   Radiological Exams on Admission: Dg Chest 2 View  07/15/2015  CLINICAL DATA:  75 year old female with increased confusion and altered mental status. EXAM: CHEST  2 VIEW COMPARISON:  Radiograph dated  08/19/2014 and 04/28/2014 FINDINGS: Two views of the chest demonstrate mild increased linear and hazy density at the left lung base, likely atelectatic changes. Developing pneumonia is less likely but not excluded.  Clinical correlation is recommended. There is no focal consolidation, pleural effusion, or pneumothorax. There is mild eventration of the right hemidiaphragm similar to prior study. The cardiac silhouette is within normal limits. No acute osseous pathology. IMPRESSION: Left lung base subsegmental atelectatic changes. Developing pneumonia is not excluded. Electronically Signed   By: Elgie CollardArash  Radparvar M.D.   On: 07/15/2015 03:00   Ct Head Wo Contrast  07/15/2015  CLINICAL DATA:  Confusion for 3 days, not improving. Baseline dementia. EXAM: CT HEAD WITHOUT CONTRAST TECHNIQUE: Contiguous axial images were obtained from the base of the skull through the vertex without intravenous contrast. COMPARISON:  CT head August 19, 2014 FINDINGS: Mildly motion degraded examination. Moderate to severe ventriculomegaly on the basis of global parenchymal brain volume loss, similar to prior examination. No intraparenchymal hemorrhage, mass effect nor midline shift. Patchy to confluent supratentorial white matter hypodensities. No acute large vascular territory infarcts. No abnormal extra-axial fluid collections. Basal cisterns are patent. Moderate calcific atherosclerosis of the carotid siphons. No skull fracture. The included ocular globes and orbital contents are non-suspicious. Status post LEFT ocular lens implant. The mastoid aircells and included paranasal sinuses are well-aerated. Patient is edentulous. IMPRESSION: No acute intracranial process. Moderate to severe global parenchymal brain volume loss with moderate to severe chronic small vessel ischemic disease. Electronically Signed   By: Awilda Metroourtnay  Bloomer M.D.   On: 07/15/2015 03:17    EKG: Independently reviewed.   Assessment/Plan Present on Admission:  . UTI (lower urinary tract infection) . Altered mental status . Dementia . Acute on chronic renal failure (HCC)  PLAN:  Will admit her for AMS (more lethargy) on baseline dementia, felt to be due to UTI and mild  dehydration with mild AKI on CKD3.  Given her having allergy to PCN, cephalosporin, and quinolone, I have decided on renally adjusted IV Bactrim.  Her urine culture was obtained, and she will be given IVF gently as well.  Will continue with her dysphagia I diet.  With her hx of HTN, her SBP is 110 now, and I will hold her antiHTN as with her UTI, and dehydration, she is at risk for being hypotensive.  Will continue with her antipsychotic medication given for agitation and mood disorder as well. She has one episode of DVT and it was over a year ago, she is no longer require anticoagulation, so I will discontinue it.  She will be given ASA low dose, and will place her on subQ heparin.  She is stable, and I suspect she will improve.  Will continue with her DNR code status.  Updated her family tonight.  They agreed with her care plans.   Other plans as per orders. Code Status: DNR.    Houston SirenLE,Arieon Scalzo, MD. FACP [facility] Pager [phone] 7pm to 7am.  07/15/2015, 4:29 AM

## 2015-07-16 DIAGNOSIS — Z8679 Personal history of other diseases of the circulatory system: Secondary | ICD-10-CM

## 2015-07-16 DIAGNOSIS — F0390 Unspecified dementia without behavioral disturbance: Secondary | ICD-10-CM

## 2015-07-16 DIAGNOSIS — L8990 Pressure ulcer of unspecified site, unspecified stage: Secondary | ICD-10-CM | POA: Insufficient documentation

## 2015-07-16 LAB — BASIC METABOLIC PANEL
Anion gap: 8 (ref 5–15)
BUN: 32 mg/dL — AB (ref 6–20)
CHLORIDE: 104 mmol/L (ref 101–111)
CO2: 28 mmol/L (ref 22–32)
Calcium: 9.2 mg/dL (ref 8.9–10.3)
Creatinine, Ser: 2.05 mg/dL — ABNORMAL HIGH (ref 0.44–1.00)
GFR calc Af Amer: 26 mL/min — ABNORMAL LOW (ref >60)
GFR calc non Af Amer: 22 mL/min — ABNORMAL LOW (ref >60)
GLUCOSE: 81 mg/dL (ref 65–99)
POTASSIUM: 3.5 mmol/L (ref 3.5–5.1)
SODIUM: 140 mmol/L (ref 135–145)

## 2015-07-16 LAB — CBC
HEMATOCRIT: 32.4 % — AB (ref 36.0–46.0)
HEMOGLOBIN: 10.3 g/dL — AB (ref 12.0–15.0)
MCH: 30.8 pg (ref 26.0–34.0)
MCHC: 31.8 g/dL (ref 30.0–36.0)
MCV: 97 fL (ref 78.0–100.0)
Platelets: 194 10*3/uL (ref 150–400)
RBC: 3.34 MIL/uL — ABNORMAL LOW (ref 3.87–5.11)
RDW: 16 % — ABNORMAL HIGH (ref 11.5–15.5)
WBC: 4.4 10*3/uL (ref 4.0–10.5)

## 2015-07-16 NOTE — Progress Notes (Signed)
Amy Clay ZOX:[id number] DOB: 08/17/1940 DOA: 07/15/2015 PCP: Colette Ribas, MD  Brief narrative: 75 year old female resident of [facility] assisted living Recent diagnosis right lower extremity DVT 03/24/15 + cellulitis Prior pressure ulcer stage III at that time Moderate dementia Hypertension Bipolar in the setting of dementia with suicidality endorsement the past as well as admission for this in the 90s. History of admission for anabolic encephalopathy back in 08/2014 and hypothermia as well as hypoglycemia History of baseline CTD stage II with creatinine 1.5 prior episodic presyncope and echocardiogram at the time/2016 showed technically adequate study with EF 60-65%   Admitted from the emergency room with increasing lethargy, decreased appetite, CT head no acute process chest x-ray atelectasis and creatinine 2.0 UA was concerning for pyelonephritiswith moderate leukocytes but negative nitrites and patient was admitted for metabolic encephalopathy confusion and possible urinary infection  Past medical history-As per Problem list Chart reviewed as below-   Consultants:  None  Procedures:  None  Antibiotics:  None   Subjective   The patient is disoriented she seems a little less so than m prior visit with her No overt c/o RN reports some sacral decubiti   Objective    Interim History:   Telemetry:    Objective: Filed Vitals:   07/15/15 1439 07/15/15 1948 07/15/15 2149 07/16/15 0406  BP: 111/59  122/88 140/74  Pulse: 88  84 100  Temp: 97.4 F (36.3 C)  98.4 F (36.9 C) 98.4 F (36.9 C)  TempSrc: Oral  Oral Oral  Resp: Height:      Weight:      SpO2: 98% 95% 95% 94%    Intake/Output Summary (Last 24 hours) at 07/16/15 0959 Last data filed at 07/15/15 2150  Gross per 24 hour  Intake    240 ml  Output      0 ml  Net    240 ml    Exam:  General: Alert but disoriented  Cardiovascular: S1-S2 no murmur rub or  gallop Respiratory: Clinically clear no added sound Abdomen:  Soft nontender nondistended no rebound or guarding Skin no lower extremity edema, sacral decubitus and Heel ulcer Neuro confused  Data Reviewed: Basic Metabolic Panel:  Recent Labs Lab 07/15/15 0305 07/15/15 0634 07/16/15 0711  NA 141  --  140  K 3.7  --  3.5  CL 104  --  104  CO2 24  --  28  GLUCOSE 104*  --  81  BUN 35*  --  32*  CREATININE 2.09* 2.20* 2.05*  CALCIUM 10.0  --  9.2   Liver Function Tests:  Recent Labs Lab 07/15/15 0305  AST 40  ALT 22  ALKPHOS 85  BILITOT 0.7  PROT 7.0  ALBUMIN 3.4*   No results for input(s): LIPASE, AMYLASE in the last 168 hours. No results for input(s): AMMONIA in the last 168 hours. CBC:  Recent Labs Lab 07/15/15 0305 07/15/15 0634 07/16/15 0711  WBC 8.2 5.3 4.4  NEUTROABS 6.3  --   --   HGB 11.8* 10.8* 10.3*  HCT 38.1 34.5* 32.4*  MCV 97.7 97.7 97.0  PLT 218 193 194   Cardiac Enzymes:  Recent Labs Lab 07/15/15 0305  TROPONINI <0.03   BNP: Invalid input(s): POCBNP CBG: No results for input(s): GLUCAP in the last 168 hours.  Recent Results (from the past 240 hour(s))  MRSA PCR Screening     Status: None   Collection Time: 07/15/15  5:39 AM  Result Value  Ref Range Status   MRSA by PCR NEGATIVE NEGATIVE Final    Comment:        The GeneXpert MRSA Assay (FDA approved for NASAL specimens only), is one component of a comprehensive MRSA colonization surveillance program. It is not intended to diagnose MRSA infection nor to guide or monitor treatment for MRSA infections.      Studies:              All Imaging reviewed and is as per above notation   Scheduled Meds: . albuterol  2.5 mg Nebulization BID  . aspirin EC  81 mg Oral BH-q7a  . benztropine  0.5 mg Oral BID  . buPROPion  300 mg Oral BH-q7a  . donepezil  5 mg Oral QHS  . feeding supplement  120 mL Oral BID  . feeding supplement (PRO-STAT SUGAR FREE 64)  30 mL Oral BID  . heparin   5,000 Units Subcutaneous 3 times per day  . lamoTRIgine  100 mg Oral BID  . multivitamin with minerals  1 tablet Oral Daily  . oxybutynin  10 mg Oral Daily  . PARoxetine  40 mg Oral QHS  . risperiDONE  0.25 mg Oral TID  . risperiDONE  1 mg Oral QHS  . saccharomyces boulardii  250 mg Oral BID  . senna  1 tablet Oral BID  . sulfamethoxazole-trimethoprim  160 mg Intravenous Q12H   Continuous Infusions: . dextrose 5 % and 0.9% NaCl 75 mL/hr at 07/15/15 1157     Assessment/Plan:  1. Toxic metabolic encephalopathy probably secondary to multiple reasons inclusive of potential cystitis versus acute kidney injury see below 2. ? Cystitis-treat with Bactrim DS IV for 24 hours. I'm not sure how the urine was obtained-complete 3 days and follow urine culture although not sure how to interpret.  UC pending now 3. Acute kidney injury baseline GFR around 40 03/2015 on admission was 24. Continue D5 normal saline 50 cc/h and increase rate to 75 cc per hour for 24 hours.  conside rRenal US if no improvement in 24 hr.  Bladder scan 4. History of hypertension-at present time we will hold prior to admission lisinopril 2.5 daily, Lasix 20 every other day Monday Wednesday Friday, terazosin 2 mg.  Would not add back ACE or HCTZ for blood pressure control as an outpatient. May resume clonidine 0.1 PTA this admission if blood pressure is elevated 5. EF 6065% without mention of diastolic dysfunction-stable 6. Moderate to severe dementia would continue paroxetine 40 daily, Risperdal 0.25 3 times a day in addition to 1 daily at bedtime,  Lamictal 100 twice a day 7. Depression-continue bupropion 300 daily at bedtime, benztropine 0.5 daily for EPS   DNR No family + Inpatient pending resolution   Pleas KochJai Lamisha Roussell, MD  [facility] Pager [phone] 07/16/2015, 9:59 AM    LOS: 1 day

## 2015-07-17 ENCOUNTER — Inpatient Hospital Stay (HOSPITAL_COMMUNITY): Payer: Medicare Other

## 2015-07-17 DIAGNOSIS — L8990 Pressure ulcer of unspecified site, unspecified stage: Secondary | ICD-10-CM

## 2015-07-17 LAB — RENAL FUNCTION PANEL
Albumin: 3 g/dL — ABNORMAL LOW (ref 3.5–5.0)
Anion gap: 7 (ref 5–15)
BUN: 22 mg/dL — AB (ref 6–20)
CHLORIDE: 107 mmol/L (ref 101–111)
CO2: 27 mmol/L (ref 22–32)
CREATININE: 1.64 mg/dL — AB (ref 0.44–1.00)
Calcium: 9.2 mg/dL (ref 8.9–10.3)
GFR calc Af Amer: 34 mL/min — ABNORMAL LOW (ref >60)
GFR calc non Af Amer: 29 mL/min — ABNORMAL LOW (ref >60)
GLUCOSE: 88 mg/dL (ref 65–99)
Phosphorus: 1.7 mg/dL — ABNORMAL LOW (ref 2.5–4.6)
Potassium: 3.5 mmol/L (ref 3.5–5.1)
Sodium: 141 mmol/L (ref 135–145)

## 2015-07-17 NOTE — Plan of Care (Signed)
Problem: Pain Managment: Goal: General experience of comfort will improve Outcome: Progressing Pt denies pain.  Problem: Physical Regulation: Goal: Ability to maintain clinical measurements within normal limits will improve Outcome: Progressing See vital sign flowsheet.   Problem: Skin Integrity: Goal: Risk for impaired skin integrity will decrease Outcome: Not Progressing Pt continues to have skin tears on arms. Foam dressings in place.

## 2015-07-17 NOTE — Progress Notes (Signed)
Amy Clay [id number]:[id number] DOB: 1940-07-25 DOA: 07/15/2015 PCP: Colette RibasGOLDING, [name], MD  Brief narrative: 75 year old female resident of [facility] assisted [facility] Recent diagnosis right lower extremity DVT 03/24/15 + cellulitis Prior pressure ulcer stage III at that time Moderate dementia Hypertension Bipolar in the setting of dementia with suicidality endorsement the past as well as admission for this in the 90s. History of admission for anabolic encephalopathy back in 08/2014 and hypothermia as well as hypoglycemia History of baseline CTD stage II with creatinine 1.5 prior episodic presyncope and echocardiogram at the time/2016 showed technically adequate study with EF 60-65%   Admitted from the emergency room with increasing lethargy, decreased appetite, CT head no acute process chest x-ray atelectasis and creatinine 2.0 UA was concerning for pyelonephritiswith moderate leukocytes but negative nitrites and patient was admitted for metabolic encephalopathy confusion and possible urinary infection  Past medical history-As per Problem list Chart reviewed as below-   Consultants:  None  Procedures:  None  Antibiotics:  None   Subjective   The patient is disoriented  She is aggressive today and asks me to leave Other than that she looks fair She refused for me to examine her today   Objective    Interim History:   Telemetry:    Objective: Filed Vitals:   07/16/15 2119 07/16/15 2325 07/17/15 0500 07/17/15 0834  BP: 156/118 143/91 137/84   Pulse: 89 85 64   Temp: 98.5 F (36.9 C) 97.3 F (36.3 C) 98.4 F (36.9 C)   TempSrc: Oral Oral Axillary   Resp: 20  20   Height:      Weight:      SpO2: 98% 96% 92% 96%    Intake/Output Summary (Last 24 hours) at 07/17/15 1542 Last data filed at 07/17/15 0800  Gross per 24 hour  Intake 4392.5 ml  Output      0 ml  Net 4392.5 ml    Exam:  Patient refused  Data Reviewed: Basic Metabolic Panel:  Recent  Labs Lab 07/15/15 0305 07/15/15 0634 07/16/15 0711 07/17/15 0753  NA 141  --  140 141  K 3.7  --  3.5 3.5  CL 104  --  104 107  CO2 24  --  28 27  GLUCOSE 104*  --  81 88  BUN 35*  --  32* 22*  CREATININE 2.09* 2.20* 2.05* 1.64*  CALCIUM 10.0  --  9.2 9.2  PHOS  --   --   --  1.7*   Liver Function Tests:  Recent Labs Lab 07/15/15 0305 07/17/15 0753  AST 40  --   ALT 22  --   ALKPHOS 85  --   BILITOT 0.7  --   PROT 7.0  --   ALBUMIN 3.4* 3.0*   No results for input(s): LIPASE, AMYLASE in the last 168 hours. No results for input(s): AMMONIA in the last 168 hours. CBC:  Recent Labs Lab 07/15/15 0305 07/15/15 0634 07/16/15 0711  WBC 8.2 5.3 4.4  NEUTROABS 6.3  --   --   HGB 11.8* 10.8* 10.3*  HCT 38.1 34.5* 32.4*  MCV 97.7 97.7 97.0  PLT 218 193 194   Cardiac Enzymes:  Recent Labs Lab 07/15/15 0305  TROPONINI <0.03   BNP: Invalid input(s): POCBNP CBG: No results for input(s): GLUCAP in the last 168 hours.  Recent Results (from the past 240 hour(s))  Urine culture     Status: None (Preliminary result)   Collection Time: 07/15/15  2:30 AM  Result Value Ref Range Status   Specimen Description URINE, CATHETERIZED  Final   Special Requests NONE  Final   Culture   Final    >=100,000 COLONIES/mL ENTEROCOCCUS SPECIES Performed at [facility]    Report Status PENDING  Incomplete  MRSA PCR Screening     Status: None   Collection Time: 07/15/15  5:39 AM  Result Value Ref Range Status   MRSA by PCR NEGATIVE NEGATIVE Final    Comment:        The GeneXpert MRSA Assay (FDA approved for NASAL specimens only), is one component of a comprehensive MRSA colonization surveillance program. It is not intended to diagnose MRSA infection nor to guide or monitor treatment for MRSA infections.      Studies:              All Imaging reviewed and is as per above notation   Scheduled Meds: . albuterol  2.5 mg Nebulization BID  . aspirin EC  81 mg Oral  BH-q7a  . benztropine  0.5 mg Oral BID  . buPROPion  300 mg Oral BH-q7a  . donepezil  5 mg Oral QHS  . feeding supplement  120 mL Oral BID  . feeding supplement (PRO-STAT SUGAR FREE 64)  30 mL Oral BID  . heparin  5,000 Units Subcutaneous 3 times per day  . lamoTRIgine  100 mg Oral BID  . multivitamin with minerals  1 tablet Oral Daily  . oxybutynin  10 mg Oral Daily  . PARoxetine  40 mg Oral QHS  . risperiDONE  0.25 mg Oral TID  . risperiDONE  1 mg Oral QHS  . saccharomyces boulardii  250 mg Oral BID  . senna  1 tablet Oral BID  . sulfamethoxazole-trimethoprim  160 mg Intravenous Q12H   Continuous Infusions: . dextrose 5 % and 0.9% NaCl 75 mL/hr at 07/17/15 0056     Assessment/Plan:  1. Toxic metabolic encephalopathy probably secondary to multiple reasons inclusive of potential cystitis versus acute kidney injury see below 2. ? Cystitis-treat with Bactrim DS IV for 24 hours. I'm not sure how the urine was obtained-complete 3 days and follow urine culture which shows enterococcus.  Would stop Abx 3/7 am 3. Acute kidney injury baseline GFR around 40 03/2015 on admission was 24. Continue D5 normal saline 50 cc/h and increase rate to 75 cc per hour for 24 hours.  AKI improved to Creat 1.6 3.6.17 4. History of hypertension-at present time we will hold prior to admission lisinopril 2.5 daily, Lasix 20 every other day Monday Wednesday Friday, terazosin 2 mg.  Would not add back ACE or HCTZ for blood pressure control as an outpatient. May resume clonidine 0.1 PTA this admission if blood pressure is elevated 5. EF 60-65% without mention of diastolic dysfunction-stable 6. Moderate to severe dementia would continue paroxetine 40 daily, Risperdal 0.25 3 times a day in addition to 1 daily at bedtime,  Lamictal 100 twice a day 7. Depression-continue bupropion 300 daily at bedtime, benztropine 0.5 daily for EPS   DNR Called family [name] [phone], updated Inpatient pending  resolution   Pleas Koch, MD  [facility] Pager [phone] 07/17/2015, 3:42 PM    LOS: 2 days

## 2015-07-18 DIAGNOSIS — K648 Other hemorrhoids: Secondary | ICD-10-CM | POA: Diagnosis not present

## 2015-07-18 DIAGNOSIS — N179 Acute kidney failure, unspecified: Secondary | ICD-10-CM | POA: Diagnosis not present

## 2015-07-18 DIAGNOSIS — L89890 Pressure ulcer of other site, unstageable: Secondary | ICD-10-CM | POA: Diagnosis not present

## 2015-07-18 DIAGNOSIS — R7989 Other specified abnormal findings of blood chemistry: Secondary | ICD-10-CM | POA: Diagnosis not present

## 2015-07-18 DIAGNOSIS — R279 Unspecified lack of coordination: Secondary | ICD-10-CM | POA: Diagnosis not present

## 2015-07-18 DIAGNOSIS — F339 Major depressive disorder, recurrent, unspecified: Secondary | ICD-10-CM | POA: Diagnosis not present

## 2015-07-18 DIAGNOSIS — N189 Chronic kidney disease, unspecified: Secondary | ICD-10-CM | POA: Diagnosis not present

## 2015-07-18 DIAGNOSIS — I8290 Acute embolism and thrombosis of unspecified vein: Secondary | ICD-10-CM | POA: Diagnosis not present

## 2015-07-18 DIAGNOSIS — M79674 Pain in right toe(s): Secondary | ICD-10-CM | POA: Diagnosis not present

## 2015-07-18 DIAGNOSIS — D696 Thrombocytopenia, unspecified: Secondary | ICD-10-CM | POA: Diagnosis not present

## 2015-07-18 DIAGNOSIS — F331 Major depressive disorder, recurrent, moderate: Secondary | ICD-10-CM | POA: Diagnosis not present

## 2015-07-18 DIAGNOSIS — F3130 Bipolar disorder, current episode depressed, mild or moderate severity, unspecified: Secondary | ICD-10-CM | POA: Diagnosis not present

## 2015-07-18 DIAGNOSIS — K219 Gastro-esophageal reflux disease without esophagitis: Secondary | ICD-10-CM | POA: Diagnosis not present

## 2015-07-18 DIAGNOSIS — M6281 Muscle weakness (generalized): Secondary | ICD-10-CM | POA: Diagnosis not present

## 2015-07-18 DIAGNOSIS — D72829 Elevated white blood cell count, unspecified: Secondary | ICD-10-CM | POA: Diagnosis not present

## 2015-07-18 DIAGNOSIS — I4891 Unspecified atrial fibrillation: Secondary | ICD-10-CM | POA: Diagnosis not present

## 2015-07-18 DIAGNOSIS — R627 Adult failure to thrive: Secondary | ICD-10-CM | POA: Diagnosis not present

## 2015-07-18 DIAGNOSIS — I10 Essential (primary) hypertension: Secondary | ICD-10-CM | POA: Diagnosis not present

## 2015-07-18 DIAGNOSIS — M79675 Pain in left toe(s): Secondary | ICD-10-CM | POA: Diagnosis not present

## 2015-07-18 DIAGNOSIS — N390 Urinary tract infection, site not specified: Secondary | ICD-10-CM | POA: Diagnosis not present

## 2015-07-18 DIAGNOSIS — F068 Other specified mental disorders due to known physiological condition: Secondary | ICD-10-CM | POA: Diagnosis not present

## 2015-07-18 DIAGNOSIS — N183 Chronic kidney disease, stage 3 (moderate): Secondary | ICD-10-CM | POA: Diagnosis not present

## 2015-07-18 DIAGNOSIS — R0602 Shortness of breath: Secondary | ICD-10-CM | POA: Diagnosis not present

## 2015-07-18 DIAGNOSIS — I82421 Acute embolism and thrombosis of right iliac vein: Secondary | ICD-10-CM | POA: Diagnosis not present

## 2015-07-18 DIAGNOSIS — Z743 Need for continuous supervision: Secondary | ICD-10-CM | POA: Diagnosis not present

## 2015-07-18 DIAGNOSIS — R4182 Altered mental status, unspecified: Secondary | ICD-10-CM | POA: Diagnosis not present

## 2015-07-18 DIAGNOSIS — N3289 Other specified disorders of bladder: Secondary | ICD-10-CM | POA: Diagnosis not present

## 2015-07-18 DIAGNOSIS — F419 Anxiety disorder, unspecified: Secondary | ICD-10-CM | POA: Diagnosis not present

## 2015-07-18 DIAGNOSIS — F319 Bipolar disorder, unspecified: Secondary | ICD-10-CM | POA: Diagnosis not present

## 2015-07-18 DIAGNOSIS — F0280 Dementia in other diseases classified elsewhere without behavioral disturbance: Secondary | ICD-10-CM | POA: Diagnosis not present

## 2015-07-18 DIAGNOSIS — D649 Anemia, unspecified: Secondary | ICD-10-CM | POA: Diagnosis not present

## 2015-07-18 DIAGNOSIS — E785 Hyperlipidemia, unspecified: Secondary | ICD-10-CM | POA: Diagnosis not present

## 2015-07-18 DIAGNOSIS — M2550 Pain in unspecified joint: Secondary | ICD-10-CM | POA: Diagnosis not present

## 2015-07-18 DIAGNOSIS — L89623 Pressure ulcer of left heel, stage 3: Secondary | ICD-10-CM | POA: Diagnosis not present

## 2015-07-18 DIAGNOSIS — I82409 Acute embolism and thrombosis of unspecified deep veins of unspecified lower extremity: Secondary | ICD-10-CM | POA: Diagnosis not present

## 2015-07-18 DIAGNOSIS — E46 Unspecified protein-calorie malnutrition: Secondary | ICD-10-CM | POA: Diagnosis not present

## 2015-07-18 DIAGNOSIS — F0390 Unspecified dementia without behavioral disturbance: Secondary | ICD-10-CM | POA: Diagnosis not present

## 2015-07-18 DIAGNOSIS — Z7901 Long term (current) use of anticoagulants: Secondary | ICD-10-CM | POA: Diagnosis not present

## 2015-07-18 DIAGNOSIS — E559 Vitamin D deficiency, unspecified: Secondary | ICD-10-CM | POA: Diagnosis not present

## 2015-07-18 DIAGNOSIS — B351 Tinea unguium: Secondary | ICD-10-CM | POA: Diagnosis not present

## 2015-07-18 DIAGNOSIS — J9811 Atelectasis: Secondary | ICD-10-CM | POA: Diagnosis not present

## 2015-07-18 LAB — BASIC METABOLIC PANEL
ANION GAP: 5 (ref 5–15)
BUN: 19 mg/dL (ref 6–20)
CHLORIDE: 112 mmol/L — AB (ref 101–111)
CO2: 26 mmol/L (ref 22–32)
Calcium: 8.9 mg/dL (ref 8.9–10.3)
Creatinine, Ser: 1.46 mg/dL — ABNORMAL HIGH (ref 0.44–1.00)
GFR calc Af Amer: 39 mL/min — ABNORMAL LOW (ref >60)
GFR, EST NON AFRICAN AMERICAN: 34 mL/min — AB (ref >60)
GLUCOSE: 68 mg/dL (ref 65–99)
Potassium: 3.7 mmol/L (ref 3.5–5.1)
Sodium: 143 mmol/L (ref 135–145)

## 2015-07-18 LAB — URINE CULTURE: Culture: >=100000

## 2015-07-18 LAB — CBC
HCT: 31.6 % — ABNORMAL LOW (ref 36.0–46.0)
HEMOGLOBIN: 9.8 g/dL — AB (ref 12.0–15.0)
MCH: 30.1 pg (ref 26.0–34.0)
MCHC: 31 g/dL (ref 30.0–36.0)
MCV: 96.9 fL (ref 78.0–100.0)
Platelets: 229 10*3/uL (ref 150–400)
RBC: 3.26 MIL/uL — ABNORMAL LOW (ref 3.87–5.11)
RDW: 15.8 % — ABNORMAL HIGH (ref 11.5–15.5)
WBC: 6.3 10*3/uL (ref 4.0–10.5)

## 2015-07-18 MED ORDER — RISPERIDONE 0.25 MG PO TABS: 0.2500 mg | ORAL_TABLET | Freq: Three times a day (TID) | ORAL | Status: DC

## 2015-07-18 MED ORDER — SULFAMETHOXAZOLE-TRIMETHOPRIM 400-80 MG/5ML IV SOLN
INTRAVENOUS | Status: AC
  Filled 2015-07-18: qty 1

## 2015-07-18 MED ORDER — CLONAZEPAM 0.5 MG PO TABS: ORAL_TABLET | ORAL | Status: DC

## 2015-07-18 MED ORDER — PAROXETINE HCL 40 MG PO TABS: 20.0000 mg | ORAL_TABLET | Freq: Every day | ORAL | Status: DC

## 2015-07-18 MED ORDER — RISPERIDONE 1 MG PO TABS: 1.0000 mg | ORAL_TABLET | Freq: Every day | ORAL | Status: DC

## 2015-07-18 MED ORDER — BENZTROPINE MESYLATE 0.5 MG PO TABS: 0.5000 mg | ORAL_TABLET | Freq: Two times a day (BID) | ORAL | Status: DC

## 2015-07-18 MED ORDER — BUPROPION HCL ER (XL) 300 MG PO TB24: 300.0000 mg | ORAL_TABLET | ORAL | Status: DC

## 2015-07-18 NOTE — Progress Notes (Signed)
Report called and given to Ms. Myers at [facility] at 4 p.m.

## 2015-07-18 NOTE — Progress Notes (Signed)
[facility] has accepted pt back to today.  CSW called and informed pt's sister, Joyce GrossKay, re: tx who was agreeable to the d/c plan.  CSW to arrange transport when RN has pt ready to go.  Pollyann SavoyJody Loyalty [place]z, [place] Coverage [phone]

## 2015-07-18 NOTE — Discharge Summary (Signed)
Physician Discharge Summary  Amy Clay ZOX:[id number] DOB: 23-Apr-1941 DOA: 07/15/2015  PCP: Colette Ribas, MD  Admit date: 07/15/2015 Discharge date: 07/18/2015  Time spent: 40 minutes  Recommendations for Outpatient Follow-up:  1. Recommend goals of care as an outpatient given severe dementia as well as debility and poor overall prognosis 2. Have discontinued both lisinopril and Lasix this admission given acute kidney injury which is felt the cause for her metabolic encephalopathy 3. She received some antibiotics in hospital for concern of bacteria or urinary tract infection however this was unfounded and would not test unless she is clinically febrile and has concerns for the same  Discharge Diagnoses:  Principal Problem:   Altered mental status Active Problems:   History of cardiovascular disorder   Acute on chronic renal failure (HCC)   Dementia   UTI (lower urinary tract infection)   Pressure ulcer   Discharge Condition: Overall guarded  Diet recommendation: Heart healthy  Filed Weights   07/15/15 0205 07/15/15 0539  Weight: 114.76 kg (253 lb) 79.153 kg (174 lb 8 oz)    History of present illness:  75 year old female resident of [facility] assisted [facility] Recent diagnosis right lower extremity DVT 03/24/15 + cellulitis Prior pressure ulcer stage III at that time Moderate dementia Hypertension Bipolar in the setting of dementia with suicidality endorsement the past as well as admission for this in the 90s. History of admission for anabolic encephalopathy back in 08/2014 and hypothermia as well as hypoglycemia History of baseline CTD stage II with creatinine 1.5 prior episodic presyncope and echocardiogram at the time/2016 showed technically adequate study with EF 60-65%   Admitted from the emergency room with increasing lethargy, decreased appetite, CT head no acute process chest x-ray atelectasis and creatinine 2.0 UA was concerning for pyelonephritiswith moderate  leukocytes but negative nitrites and patient was admitted for metabolic encephalopathy confusion and possible urinary infection  Hospital Course:   1. Toxic metabolic encephalopathy probably secondary to multiple reasons inclusive of potential cystitis versus acute kidney injury see below--it was felt ultimately that there was no dysuria or concerns for urinary infection patient was discontinued off antibiotics 2. ? Cystitis-treat with Bactrim DS IV for 24 hours. I'm not sure how the urine was obtained-complete 3 days and follow urine culture which shows enterococcus--this was pan resistant and it was not felt that patient would benefit from further antibiotics and they were stopped 3/7 am 3. Acute kidney injury baseline GFR around 40 03/2015 on admission was 24. Continue D5 normal saline 50 cc/h and increase rate to 75 cc per hour for 24 hours. AKI improved to Creat 1.6 on 3.6.17 4. History of hypertension-at present time we will hold prior to admission lisinopril 2.5 daily, Lasix 20 every other day Monday Wednesday Friday, terazosin 2 mg. Would not add back ACE or HCTZ for blood pressure control as an outpatient. May resume clonidine 0.1 PTA this admission if blood pressure is elevated 5. EF 60-65% without mention of diastolic dysfunction-stable 6. Moderate to severe dementia would continue paroxetine 40 daily, Risperdal 0.25 3 times a day in addition to 1 daily at bedtime, Lamictal 100 twice a day heart strips were given on discharge-- 7. Depression-continue bupropion 300 daily at bedtime, benztropine 0.5 daily for EPS   DNR  Discharge Exam: Filed Vitals:   07/17/15 2300 07/18/15 0500  BP: 130/72 152/64  Pulse: 72 81  Temp: 97.2 F (36.2 C) 98.4 F (36.9 C)  Resp: 20 20    General: Alert pleasant oriented no  apparent distress tolerating diet S1-S2 no murmur rub or gallop  Respiratory: Clinically clear   Discharge Instructions    Current Discharge Medication List     CONTINUE these medications which have NOT CHANGED   Details  acetaminophen (TYLENOL) 650 MG CR tablet Take 650 mg by mouth every 6 (six) hours as needed for pain.    Amino Acids-Protein Hydrolys (FEEDING SUPPLEMENT, PRO-STAT SUGAR FREE 64,) LIQD Take 30 mLs by mouth 2 (two) times daily.    aspirin EC 81 MG tablet Take 81 mg by mouth every morning.     benztropine (COGENTIN) 0.5 MG tablet Take 1 tablet (0.5 mg total) by mouth 2 (two) times daily. Qty: 60 tablet, Refills: 2   Associated Diagnoses: Major depressive disorder, recurrent episode, moderate (HCC)    buPROPion (WELLBUTRIN XL) 300 MG 24 hr tablet Take 1 tablet (300 mg total) by mouth every morning. Qty: 30 tablet, Refills: 2    Calcium Carb-Vit D-C-E-Mineral (OS-CAL ULTRA) 600 MG TABS Take by mouth 2 (two) times daily.    Cholecalciferol (VITAMIN D) 2000 UNITS CAPS Take 1 capsule by mouth every morning.     clonazePAM (KLONOPIN) 0.5 MG tablet Take daily at 5 pm Qty: 30 tablet, Refills: 2    cloNIDine (CATAPRES) 0.1 MG tablet Take 0.1 mg by mouth as needed.    diphenhydrAMINE (BENADRYL) 25 MG tablet Take 25 mg by mouth daily.    docusate sodium (STOOL SOFTENER) 100 MG capsule Take 200 mg by mouth at bedtime.    donepezil (ARICEPT) 5 MG tablet Take 1 tablet (5 mg total) by mouth at bedtime. Qty: 30 tablet, Refills: 2    furosemide (LASIX) 20 MG tablet Take 20 mg by mouth every Monday, Wednesday, and Friday.     lamoTRIgine (LAMICTAL) 100 MG tablet Take 1 tablet (100 mg total) by mouth 2 (two) times daily. Qty: 60 tablet, Refills: 2   Associated Diagnoses: Major depressive disorder, recurrent episode, moderate (HCC)    lisinopril (PRINIVIL,ZESTRIL) 2.5 MG tablet Take 2.5 mg by mouth daily.    loratadine (CLARITIN) 10 MG tablet Take 10 mg by mouth every 12 (twelve) hours as needed for allergies.     Multiple Vitamin (MULTIVITAMIN) capsule Take 1 capsule by mouth daily.    multivitamin (PROSIGHT) TABS tablet Take 1  tablet by mouth daily.    Nutritional Supplements (RESOURCE 2.0) LIQD Take 120 mLs by mouth 2 (two) times daily.    omeprazole (PRILOSEC) 40 MG capsule Take 20 mg by mouth at bedtime.     ondansetron (ZOFRAN) 4 MG tablet Take 4 mg by mouth every 8 (eight) hours as needed for nausea or vomiting.    oxybutynin (DITROPAN-XL) 10 MG 24 hr tablet Take 10 mg by mouth daily.     PARoxetine (PAXIL) 40 MG tablet Take 1 tablet (40 mg total) by mouth at bedtime. Qty: 30 tablet, Refills: 2    polyethylene glycol (MIRALAX / GLYCOLAX) packet Take 17 g by mouth daily.     !! risperiDONE (RISPERDAL) 0.25 MG tablet Take 1 tablet (0.25 mg total) by mouth 3 (three) times daily. Takes at 8:00AM, 2:00PM, 5:00PM. Qty: 90 tablet, Refills: 2    !! risperiDONE (RISPERDAL) 1 MG tablet Take 1 tablet (1 mg total) by mouth at bedtime. Qty: 30 tablet, Refills: 2   Associated Diagnoses: Major depressive disorder, recurrent episode, moderate (HCC); Insomnia due to mental disorder    senna (SENOKOT) 8.6 MG TABS tablet Take 1 tablet by mouth 2 (two) times daily.  terazosin (HYTRIN) 2 MG capsule Take 2 mg by mouth at bedtime.     acetaminophen (TYLENOL) 500 MG tablet Take 1,000 mg by mouth every 6 (six) hours as needed for pain.    albuterol (PROVENTIL) (2.5 MG/3ML) 0.083% nebulizer solution Take 2.5 mg by nebulization 2 (two) times daily.    bisacodyl (DULCOLAX) 10 MG suppository Place 10 mg rectally every 8 (eight) hours as needed for moderate constipation. Reported on 07/17/2015    clindamycin (CLEOCIN) 300 MG capsule Take 1 capsule (300 mg total) by mouth 3 (three) times daily. Qty: 12 capsule, Refills: 0    enoxaparin (LOVENOX) 150 MG/ML injection Inject 0.7 mLs (105 mg total) into the skin daily. Qty: 3 Syringe, Refills: 0    saccharomyces boulardii (FLORASTOR) 250 MG capsule Take 1 capsule (250 mg total) by mouth 2 (two) times daily. Qty: 30 capsule, Refills: 0    warfarin (COUMADIN) 5 MG tablet Take 1  tablet (5 mg total) by mouth once. Qty: 30 tablet, Refills: 0     !! - Potential duplicate medications found. Please discuss with provider.     Allergies  Allergen Reactions  . Benzodiazepines Other (See Comments)    Memory problems on Ativan, which goes away when she is off of it.   . Ciprofloxacin     Rash at site of injection with itching  . Cephalexin Rash and Other (See Comments)    Blistering of the mouth  . Penicillins Rash      The results of significant diagnostics from this hospitalization (including imaging, microbiology, ancillary and laboratory) are listed below for reference.    Significant Diagnostic Studies: Dg Chest 1 View  07/17/2015  CLINICAL DATA:  Pneumonia EXAM: CHEST 1 VIEW COMPARISON:  07/15/2015 FINDINGS: Lung volumes are low. Right lung remains clear. There is some persistent streaky opacity at the left base, likely atelectatic. The cardiopericardial silhouette is within normal limits for size. The visualized bony structures of the thorax are intact. IMPRESSION: Low volume film with probable atelectasis at the left base. Electronically Signed   By: Kennith Center M.D.   On: 07/17/2015 09:08   Dg Chest 2 View  07/15/2015  CLINICAL DATA:  75 year old female with increased confusion and altered mental status. EXAM: CHEST  2 VIEW COMPARISON:  Radiograph dated 08/19/2014 and 04/28/2014 FINDINGS: Two views of the chest demonstrate mild increased linear and hazy density at the left lung base, likely atelectatic changes. Developing pneumonia is less likely but not excluded. Clinical correlation is recommended. There is no focal consolidation, pleural effusion, or pneumothorax. There is mild eventration of the right hemidiaphragm similar to prior study. The cardiac silhouette is within normal limits. No acute osseous pathology. IMPRESSION: Left lung base subsegmental atelectatic changes. Developing pneumonia is not excluded. Electronically Signed   By: Elgie Collard M.D.    On: 07/15/2015 03:00   Ct Head Wo Contrast  07/15/2015  CLINICAL DATA:  Confusion for 3 days, not improving. Baseline dementia. EXAM: CT HEAD WITHOUT CONTRAST TECHNIQUE: Contiguous axial images were obtained from the base of the skull through the vertex without intravenous contrast. COMPARISON:  CT head August 19, 2014 FINDINGS: Mildly motion degraded examination. Moderate to severe ventriculomegaly on the basis of global parenchymal brain volume loss, similar to prior examination. No intraparenchymal hemorrhage, mass effect nor midline shift. Patchy to confluent supratentorial white matter hypodensities. No acute large vascular territory infarcts. No abnormal extra-axial fluid collections. Basal cisterns are patent. Moderate calcific atherosclerosis of the carotid siphons. No skull  fracture. The included ocular globes and orbital contents are non-suspicious. Status post LEFT ocular lens implant. The mastoid aircells and included paranasal sinuses are well-aerated. Patient is edentulous. IMPRESSION: No acute intracranial process. Moderate to severe global parenchymal brain volume loss with moderate to severe chronic small vessel ischemic disease. Electronically Signed   By: Awilda Metroourtnay  Bloomer M.D.   On: 07/15/2015 03:17    Microbiology: Recent Results (from the past 240 hour(s))  Urine culture     Status: None   Collection Time: 07/15/15  2:30 AM  Result Value Ref Range Status   Specimen Description URINE, CATHETERIZED  Final   Special Requests NONE  Final   Culture   Final    >=100,000 COLONIES/mL ENTEROCOCCUS SPECIES [facility]formed [facility]    Report Status 07/18/2015 FINAL  Final   Organism ID, Bacteria ENTEROCOCCUS SPECIES  Final      Susceptibility   Enterococcus species - MIC*    AMPICILLIN 16 RESISTANT Resistant     LEVOFLOXACIN >=8 RESISTANT Resistant     NITROFURANTOIN 256 RESISTANT Resistant     VANCOMYCIN <=0.5 SENSITIVE Sensitive     * >=100,000 COLONIES/mL ENTEROCOCCUS  SPECIES  MRSA PCR Screening     Status: None   Collection Time: 07/15/15  5:39 AM  Result Value Ref Range Status   MRSA by PCR NEGATIVE NEGATIVE Final    Comment:        The GeneXpert MRSA Assay (FDA approved for NASAL specimens only), is one component of a comprehensive MRSA colonization surveillance program. It is not intended to diagnose MRSA infection nor to guide or monitor treatment for MRSA infections.      Labs: Basic Metabolic Panel:  Recent Labs Lab 07/15/15 0305 07/15/15 0634 07/16/15 0711 07/17/15 0753 07/18/15 0629  NA 141  --  140 141 143  K 3.7  --  3.5 3.5 3.7  CL 104  --  104 107 112*  CO2 24  --  28 27 26   GLUCOSE 104*  --  81 88 68  BUN 35*  --  32* 22* 19  CREATININE 2.09* 2.20* 2.05* 1.64* 1.46*  CALCIUM 10.0  --  9.2 9.2 8.9  PHOS  --   --   --  1.7*  --    Liver Function Tests:  Recent Labs Lab 07/15/15 0305 07/17/15 0753  AST 40  --   ALT 22  --   ALKPHOS 85  --   BILITOT 0.7  --   PROT 7.0  --   ALBUMIN 3.4* 3.0*   No results for input(s): LIPASE, AMYLASE in the last 168 hours. No results for input(s): AMMONIA in the last 168 hours. CBC:  Recent Labs Lab 07/15/15 0305 07/15/15 0634 07/16/15 0711 07/18/15 0629  WBC 8.2 5.3 4.4 6.3  NEUTROABS 6.3  --   --   --   HGB 11.8* 10.8* 10.3* 9.8*  HCT 38.1 34.5* 32.4* 31.6*  MCV 97.7 97.7 97.0 96.9  PLT 218 193 194 229   Cardiac Enzymes:  Recent Labs Lab 07/15/15 0305  TROPONINI <0.03   BNP: BNP (last 3 results) No results for input(s): BNP in the last 8760 hours.  ProBNP (last 3 results) No results for input(s): PROBNP in the last 8760 hours.  CBG: No results for input(s): GLUCAP in the last 168 hours.     SignedRhetta Mura:  [name], JAI-GURMUKH MD   [facility] 07/18/2015, 8:49 AM

## 2015-07-18 NOTE — Progress Notes (Signed)
[place] [place] [place] [facility] [place] [facility].  Pollyann SavoyJody Clema Skousen Coverage [phone]

## 2015-07-18 NOTE — [place] FL2 (Signed)
[place] MEDICAID FL2 LEVEL OF CARE SCREENING TOOL     IDENTIFICATION  Patient Name: Amy Clay Birthdate: 10/15/1940 Sex: female Admission Date (Current Location): 07/15/2015  [facility] [place]   Facility and Address:  [facility],  [address], [place] [id number]      Provider Number: [id number]  Attending Physician Name and Address:  Rhetta MuraJai-Gurmukh Samtani, MD  Relative Name and Phone Number:       Current Level of Care: Hospital Recommended Level of Care: Skilled Nursing Facility Prior Approval Number:    Date Approved/Denied:   PASRR Number:    Discharge Plan: SNF    Current Diagnoses: Patient Active Problem List   Diagnosis Date Noted  . Pressure ulcer 07/16/2015  . UTI (lower urinary tract infection) 07/15/2015  . DVT (deep venous thrombosis), unspecified laterality 03/24/2015  . Heel ulcer (HCC) 03/24/2015  . Hematoma of abdominal wall 08/20/2014  . Hypoglycemia 08/20/2014  . Morbid obesity (HCC) 08/20/2014  . Thrombocytopenia (HCC) 08/20/2014  . Altered mental status 08/19/2014  . Altered mental state 08/19/2014  . CKD (chronic kidney disease) stage 3, GFR 30-59 ml/min 08/19/2014  . Bilateral lower extremity edema 08/19/2014  . Hypothermia 08/19/2014  . Dementia 04/20/2014  . Benign essential HTN 04/20/2014  . Physical deconditioning   . Acute on chronic renal failure (HCC) 02/10/2013  . Anemia 02/10/2013  . Fall 02/09/2013  . Ingrown right big toenail 12/30/2012  . 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 Syncope 02/13/2012  . Constipation 10/01/2011  . Dysphagia 10/01/2011  . Abdominal pain 10/01/2011  . Umbilical hernia 10/01/2011  . Syncope and collapse 09/15/2011  . Major depression (HCC) 09/15/2011  . Arthritis 09/15/2011  . Rotator cuff syndrome of right shoulder 11/13/2010  . CELLULITIS/ABSCESS, LEG 03/03/2007  . History of cardiovascular disorder 02/13/2007    Orientation RESPIRATION  BLADDER Height & Weight     Self, Place  Normal Incontinent Weight: 174 lb 8 oz (79.153 kg) Height:  5\' 6"  (167.6 cm)  BEHAVIORAL SYMPTOMS/MOOD NEUROLOGICAL BOWEL NUTRITION STATUS   (n/a)  (n/a) Incontinent Diet (low sodium heart healthy)  AMBULATORY STATUS COMMUNICATION OF NEEDS Skin   Extensive Assist Verbally Normal                       Personal Care Assistance Level of Assistance  Bathing, Feeding, Dressing Bathing Assistance: Maximum assistance Feeding assistance: Limited assistance Dressing Assistance: Maximum assistance     Functional Limitations Info             SPECIAL CARE FACTORS FREQUENCY                       Contractures Contractures Info: Not present    Additional Factors Info   (pt is a dnr)               Current Medications (07/18/2015):  This is the current hospital active medication list Current Facility-Administered Medications  Medication Dose Route Frequency Provider Last Rate Last Dose  . acetaminophen (TYLENOL) tablet 650 mg  650 mg Oral Q6H PRN Houston SirenPeter Le, MD       Or  . acetaminophen (TYLENOL) suppository 650 mg  650 mg Rectal Q6H PRN Houston SirenPeter Le, MD      . albuterol (PROVENTIL) (2.5 MG/3ML) 0.083% nebulizer solution 2.5 mg  2.5 mg Nebulization BID Houston SirenPeter Le, MD   2.5 mg at 07/18/15 0727  . aspirin EC tablet 81 mg  81 mg Oral Bunnie Domino, MD   81 mg at 07/18/15 1054  . benztropine (COGENTIN) tablet 0.5 mg  0.5 mg Oral BID Houston Siren, MD   0.5 mg at 07/18/15 1053  . buPROPion (WELLBUTRIN XL) 24 hr tablet 300 mg  300 mg Oral Bunnie Domino, MD   300 mg at 07/18/15 1056  . dextrose 5 %-0.9 % sodium chloride infusion   Intravenous Continuous Rhetta Mura, MD 75 mL/hr at 07/18/15 1610    . donepezil (ARICEPT) tablet 5 mg  5 mg Oral QHS Houston Siren, MD   5 mg at 07/17/15 2039  . feeding supplement (BOOST HIGH PROTEIN) liquid 120 mL  120 mL Oral BID Houston Siren, MD   120 mL at 07/18/15 1056  . feeding supplement (PRO-STAT SUGAR FREE 64)  liquid 30 mL  30 mL Oral BID Houston Siren, MD   30 mL at 07/18/15 1052  . heparin injection 5,000 Units  5,000 Units Subcutaneous 3 times per day Houston Siren, MD   5,000 Units at 07/18/15 0600  . lamoTRIgine (LAMICTAL) tablet 100 mg  100 mg Oral BID Houston Siren, MD   100 mg at 07/18/15 1056  . multivitamin with minerals tablet 1 tablet  1 tablet Oral Daily Houston Siren, MD   1 tablet at 07/18/15 1053  . ondansetron (ZOFRAN) tablet 4 mg  4 mg Oral Q8H PRN Houston Siren, MD   4 mg at 07/15/15 1221  . oxybutynin (DITROPAN-XL) 24 hr tablet 10 mg  10 mg Oral Daily Houston Siren, MD   10 mg at 07/18/15 1054  . PARoxetine (PAXIL) tablet 40 mg  40 mg Oral QHS Houston Siren, MD   40 mg at 07/17/15 2038  . risperiDONE (RISPERDAL) tablet 0.25 mg  0.25 mg Oral TID Houston Siren, MD   0.25 mg at 07/18/15 0855  . risperiDONE (RISPERDAL) tablet 1 mg  1 mg Oral QHS Houston Siren, MD   1 mg at 07/17/15 2038  . saccharomyces boulardii (FLORASTOR) capsule 250 mg  250 mg Oral BID Houston Siren, MD   250 mg at 07/18/15 1054  . senna (SENOKOT) tablet 8.6 mg  1 tablet Oral BID Houston Siren, MD   8.6 mg at 07/18/15 1055     Discharge Medications: Please see discharge summary for a list of discharge medications.  Relevant Imaging Results:  Relevant Lab Results:   Additional Information    Dickson Kostelnik M, LCSW

## 2015-07-18 NOTE — Care Management Important Message (Signed)
Important Message  Patient Details  Name: Amy Clay MRN: [id number] Date of Birth: [birth year]   Medicare Important Message Given:  Yes    Malcolm MetroChildress, Mae Denunzio Demske, RN 07/18/2015, 1:57 PM

## 2015-07-18 NOTE — Care Management Note (Signed)
Case Management Note  Patient Details  Name: Koren BoundFrances Cadieux MRN: [id number] Date of Birth: 10-12-1940  Subjective/Objective:                  Pt admitted with AMS. Pt is from [facility]. Pt discharging today, returning to SNF. CSW is aware and has arranged for return to facility.   Action/Plan: No CM needs.   Expected Discharge Date:       07/18/2015           Expected Discharge Plan:  Skilled Nursing Facility  In-House Referral:  Clinical Social Work  Discharge planning Services  CM Consult  Post Acute Care Choice:  NA Choice offered to:  NA  DME Arranged:    DME Agency:     HH Arranged:    HH Agency:     Status of Service:  Completed, signed off  Medicare Important Message Given:  Yes Date Medicare IM Given:    Medicare IM give by:    Date Additional Medicare IM Given:    Additional Medicare Important Message give by:     If discussed at Long Length of Stay Meetings, dates discussed:    Additional Comments:  Malcolm MetroChildress, [place] Demske, RN 07/18/2015, 1:58 PM

## 2015-07-31 DIAGNOSIS — I8290 Acute embolism and thrombosis of unspecified vein: Secondary | ICD-10-CM | POA: Diagnosis not present

## 2015-08-09 DIAGNOSIS — L89623 Pressure ulcer of left heel, stage 3: Secondary | ICD-10-CM | POA: Diagnosis not present

## 2015-08-14 DIAGNOSIS — I8290 Acute embolism and thrombosis of unspecified vein: Secondary | ICD-10-CM | POA: Diagnosis not present

## 2015-08-14 DIAGNOSIS — N183 Chronic kidney disease, stage 3 (moderate): Secondary | ICD-10-CM | POA: Diagnosis not present

## 2015-08-14 DIAGNOSIS — D649 Anemia, unspecified: Secondary | ICD-10-CM | POA: Diagnosis not present

## 2015-08-14 DIAGNOSIS — R627 Adult failure to thrive: Secondary | ICD-10-CM | POA: Diagnosis not present

## 2015-08-16 DIAGNOSIS — L89890 Pressure ulcer of other site, unstageable: Secondary | ICD-10-CM | POA: Diagnosis not present

## 2015-08-21 DIAGNOSIS — I8290 Acute embolism and thrombosis of unspecified vein: Secondary | ICD-10-CM | POA: Diagnosis not present

## 2015-08-22 ENCOUNTER — Ambulatory Visit (INDEPENDENT_AMBULATORY_CARE_PROVIDER_SITE_OTHER): Payer: Medicare Other | Admitting: Psychiatry

## 2015-08-22 ENCOUNTER — Encounter (HOSPITAL_COMMUNITY): Payer: Self-pay | Admitting: Psychiatry

## 2015-08-22 VITALS — BP 98/64 | HR 80 | Ht 66.0 in

## 2015-08-22 DIAGNOSIS — F331 Major depressive disorder, recurrent, moderate: Secondary | ICD-10-CM | POA: Diagnosis not present

## 2015-08-22 MED ORDER — BENZTROPINE MESYLATE 0.5 MG PO TABS: 0.5000 mg | ORAL_TABLET | Freq: Two times a day (BID) | ORAL | Status: DC

## 2015-08-22 MED ORDER — RISPERIDONE 1 MG PO TABS: 1.0000 mg | ORAL_TABLET | Freq: Every day | ORAL | Status: DC

## 2015-08-22 MED ORDER — BUPROPION HCL ER (XL) 300 MG PO TB24: 300.0000 mg | ORAL_TABLET | ORAL | Status: DC

## 2015-08-22 MED ORDER — CLONAZEPAM 0.5 MG PO TABS: ORAL_TABLET | ORAL | Status: DC

## 2015-08-22 MED ORDER — RISPERIDONE 0.25 MG PO TABS: 0.2500 mg | ORAL_TABLET | Freq: Three times a day (TID) | ORAL | Status: DC

## 2015-08-22 MED ORDER — PAROXETINE HCL 40 MG PO TABS: 40.0000 mg | ORAL_TABLET | Freq: Every day | ORAL | Status: DC

## 2015-08-22 NOTE — Progress Notes (Signed)
Patient ID: Amy Clay, female   DOB: [birth year], 75 y.o.   MRN: [id number] Patient ID: Amy Clay, female   DOB: 31-May-1940, 75 y.o.   MRN: [id number] Patient ID: Amy Clay, female   DOB: February 09, 1941, 75 y.o.   MRN: [id number] Patient ID: Amy Clay, female   DOB: 15-Aug-1940, 75 y.o.   MRN: [id number] Patient ID: Amy Clay, female   DOB: 1941-03-22, 75 y.o.   MRN: [id number] Patient ID: Amy Clay, female   DOB: 1940-12-01, 75 y.o.   MRN: [id number] Patient ID: Amy Clay, female   DOB: December 13, 1940, 75 y.o.   MRN: [id number] Patient ID: Amy Clay, female   DOB: 06/14/1940, 75 y.o.   MRN: [id number] Patient ID: Amy Clay, female   DOB: 04-19-1941, 75 y.o.   MRN: [id number] Patient ID: Amy Clay, female   DOB: 16-Jul-1940, 75 y.o.   MRN: [id number] Patient ID: Amy Clay, female   DOB: 29-Aug-1940, 75 y.o.   MRN: [id number] Patient ID: Amy Clay, female   DOB: 26-Mar-1941, 75 y.o.   MRN: [id number] Patient ID: Amy Clay, female   DOB: 17-Nov-1940, 75 y.o.   MRN: [id number] Patient ID: Amy Clay, female   DOB: Apr 14, 1941, 75 y.o.   MRN: [id number] [facility] [id number] Progress Note Loukisha Gunnerson MRN: [id number] DOB: Sep 16, 1940 Age: 75 y.o.  Date: 08/22/2015  Chief Complaint  Patient presents with  . Depression  . Manic Behavior  . Anxiety  . Agitation  . Follow-up   History of presenting illness Patient is 75 year old Caucasian female who came with her sister for her followup appointment. She lives in the [facility] assisted care facility. She and her sister had been living together in the family farm outside of [place]. Neither one has ever been married or have had children. The patient worked until the early 80s when she began to have difficulties with her nerves.  Currently the patient is still struggling with her mental and physical health. She was admitted last year to a psychiatric hospital in [place] because she was anxious  nervous and depressed. Last time Dr. Lolly Mustache increased her Risperdal which is helped somewhat. She can't walk and it's not clear why much of it seems to be anxiety based and she seems to have a fear of falling. She's also recently developed possible renal failure and is can be referred to a nephrologist. She was on lithium for a number of years. We do not have her  recent records from her primary care physician who is outside of [place]  The patient returns after 3 months. She is here with the nursing assistant from [facility] nursing [facility]. She was again hospitalized last month for presumed UTI with encephalopathy and mental confusion. Turned out however she did not have a urinary infection but her confusion improved after short-term antibiotic treatment and IV fluids. She really doesn't remember where she lives and thinks she still living at home with her sister. She is basically oriented only to person. She states she is nervous but doesn't know why. Her assistant states that overall she's been doing fairly well and is not crying quite as much or crying out for help. She is not talked about hearing or seeing anything unusual. She is anxious but not paranoid Past psychiatric history Patient is seeing psychiatrist since 1997 in this office.  Her last psychiatric admission was at [facility] after having suicidal thoughts.  She denies any history of suicidal attempt however endorse history of hopeless feeling.  She is diagnosed with  major depressive disorder. She she was very stable on lithium until her creatinine went up and lithium was discontinued.  We had recently tried Lamictal and the dose has been increased recently.  In the past we had tried BuSpar which was discontinued at hospital.  She has another psychiatric inpatient treatment in 90s. She admitted history of mania, depression and psychosis.  Social history Patient lives in a nursing home.  She has no children.  Her husband died in  nursing home.  Patient has no other family.  Medical history Patient was recently admitted due to dehydration and UTI.  Patient has history of arthritis blood pressure and chronic pain. Her primary care physician is the provider for the nursing home  She takes medication for her blood pressure and arthritis.  She had history of jerking movements, fall and generalized pain. Her last blood work was done on 02/14/2012 which shows anemia, her creatinine was 1.33.  She was given antibiotic result UTI.  Her WBC count was 10.4.  Family History family history includes Anxiety disorder in her paternal aunt and paternal uncle; Bipolar disorder in her cousin. There is no history of Colon cancer.  ROS   Mental status examination Patient is casually dressed and fairly groomed. She is in a wheelchair. Her feet aren't pressure boots   She described her mood as ok.Her voice is high-pitched and soft.  .Her affect is blunted and she is very quiet today Her attention and concentration are poor today  She said very little today  She doesn't have auditory or visual hallucination.  There were no paranoia or delusion obsession present at this time.  Her fund of knowledge is poor. Her orientation is only to self and she doesn't know where she lives or the date.  There are no tremors and shakes in her hand.  She's alert but only oriented to person  Her insight judgment are poor and impulse control is okay.  Lab Results:  Results for orders placed or performed during the hospital encounter of 07/15/15 (from the past 8736 hour(s))  Urine culture   Collection Time: 07/15/15  2:30 AM  Result Value Ref Range   Specimen Description URINE, CATHETERIZED    Special Requests NONE    Culture      >=100,000 COLONIES/mL ENTEROCOCCUS SPECIES Performed at [facility]    Report Status 07/18/2015 FINAL    Organism ID, Bacteria ENTEROCOCCUS SPECIES       Susceptibility   Enterococcus species - MIC*    AMPICILLIN 16  RESISTANT Resistant     LEVOFLOXACIN >=8 RESISTANT Resistant     NITROFURANTOIN 256 RESISTANT Resistant     VANCOMYCIN <=0.5 SENSITIVE Sensitive     LINEZOLID 2 SENSITIVE Sensitive     * >=100,000 COLONIES/mL ENTEROCOCCUS SPECIES  Urinalysis, Routine w reflex microscopic (not at [facility])   Collection Time: 07/15/15  2:30 AM  Result Value Ref Range   Color, Urine YELLOW YELLOW   APPearance CLOUDY (A) CLEAR   Specific Gravity, Urine 1.020 1.005 - 1.030   pH 5.5 5.0 - 8.0   Glucose, UA NEGATIVE NEGATIVE mg/dL   Hgb urine dipstick TRACE (A) NEGATIVE   Bilirubin Urine NEGATIVE NEGATIVE   Ketones, ur NEGATIVE NEGATIVE mg/dL   Protein, ur NEGATIVE NEGATIVE mg/dL   Nitrite NEGATIVE NEGATIVE   Leukocytes, UA MODERATE (A) NEGATIVE  Urine microscopic-add on   Collection Time: 07/15/15  2:30 AM  Result Value Ref Range   Squamous Epithelial / LPF 0-5 (A) NONE  SEEN   WBC, UA TOO NUMEROUS TO COUNT 0 - 5 WBC/hpf   RBC / HPF 0-5 0 - 5 RBC/hpf   Bacteria, UA MANY (A) NONE SEEN  CBC with Differential/Platelet   Collection Time: 07/15/15  3:05 AM  Result Value Ref Range   WBC 8.2 4.0 - 10.5 K/uL   RBC 3.90 3.87 - 5.11 MIL/uL   Hemoglobin 11.8 (L) 12.0 - 15.0 g/dL   HCT 16.1 09.6 - 04.5 %   MCV 97.7 78.0 - 100.0 fL   MCH 30.3 26.0 - 34.0 pg   MCHC 31.0 30.0 - 36.0 g/dL   RDW 40.9 (H) 81.1 - 91.4 %   Platelets 218 150 - 400 K/uL   Neutrophils Relative % 79 %   Neutro Abs 6.3 1.7 - 7.7 K/uL   Lymphocytes Relative 13 %   Lymphs Abs 1.1 0.7 - 4.0 K/uL   Monocytes Relative 6 %   Monocytes Absolute 0.5 0.1 - 1.0 K/uL   Eosinophils Relative 2 %   Eosinophils Absolute 0.2 0.0 - 0.7 K/uL   Basophils Relative 0 %   Basophils Absolute 0.0 0.0 - 0.1 K/uL  Comprehensive metabolic panel   Collection Time: 07/15/15  3:05 AM  Result Value Ref Range   Sodium 141 135 - 145 mmol/L   Potassium 3.7 3.5 - 5.1 mmol/L   Chloride 104 101 - 111 mmol/L   CO2 24 22 - 32 mmol/L   Glucose, Bld 104 (H) 65 - 99  mg/dL   BUN 35 (H) 6 - 20 mg/dL   Creatinine, Ser 7.82 (H) 0.44 - 1.00 mg/dL   Calcium 95.6 8.9 - 21.3 mg/dL   Total Protein 7.0 6.5 - 8.1 g/dL   Albumin 3.4 (L) 3.5 - 5.0 g/dL   AST 40 15 - 41 U/L   ALT 22 14 - 54 U/L   Alkaline Phosphatase 85 38 - 126 U/L   Total Bilirubin 0.7 0.3 - 1.2 mg/dL   GFR calc non Af Amer 22 (L) >60 mL/min   GFR calc Af Amer 25 (L) >60 mL/min   Anion gap 13 5 - 15  Troponin I   Collection Time: 07/15/15  3:05 AM  Result Value Ref Range   Troponin I <0.03 <0.031 ng/mL  MRSA PCR Screening   Collection Time: 07/15/15  5:39 AM  Result Value Ref Range   MRSA by PCR NEGATIVE NEGATIVE  CBC   Collection Time: 07/15/15  6:34 AM  Result Value Ref Range   WBC 5.3 4.0 - 10.5 K/uL   RBC 3.53 (L) 3.87 - 5.11 MIL/uL   Hemoglobin 10.8 (L) 12.0 - 15.0 g/dL   HCT 08.6 (L) 57.8 - 46.9 %   MCV 97.7 78.0 - 100.0 fL   MCH 30.6 26.0 - 34.0 pg   MCHC 31.3 30.0 - 36.0 g/dL   RDW 62.9 (H) 52.8 - 41.3 %   Platelets 193 150 - 400 K/uL  Creatinine, serum   Collection Time: 07/15/15  6:34 AM  Result Value Ref Range   Creatinine, Ser 2.20 (H) 0.44 - 1.00 mg/dL   GFR calc non Af Amer 21 (L) >60 mL/min   GFR calc Af Amer 24 (L) >60 mL/min  TSH   Collection Time: 07/15/15  6:34 AM  Result Value Ref Range   TSH 0.588 0.350 - 4.500 uIU/mL  Basic metabolic panel   Collection Time: 07/16/15  7:11 AM  Result Value Ref Range   Sodium 140 135 - 145 mmol/L  Potassium 3.5 3.5 - 5.1 mmol/L   Chloride 104 101 - 111 mmol/L   CO2 28 22 - 32 mmol/L   Glucose, Bld 81 65 - 99 mg/dL   BUN 32 (H) 6 - 20 mg/dL   Creatinine, Ser 5.32 (H) 0.44 - 1.00 mg/dL   Calcium 9.2 8.9 - 99.2 mg/dL   GFR calc non Af Amer 22 (L) >60 mL/min   GFR calc Af Amer 26 (L) >60 mL/min   Anion gap 8 5 - 15  CBC   Collection Time: 07/16/15  7:11 AM  Result Value Ref Range   WBC 4.4 4.0 - 10.5 K/uL   RBC 3.34 (L) 3.87 - 5.11 MIL/uL   Hemoglobin 10.3 (L) 12.0 - 15.0 g/dL   HCT 42.6 (L) 83.4 - 19.6 %    MCV 97.0 78.0 - 100.0 fL   MCH 30.8 26.0 - 34.0 pg   MCHC 31.8 30.0 - 36.0 g/dL   RDW 22.2 (H) 97.9 - 89.2 %   Platelets 194 150 - 400 K/uL  Renal function panel   Collection Time: 07/17/15  7:53 AM  Result Value Ref Range   Sodium 141 135 - 145 mmol/L   Potassium 3.5 3.5 - 5.1 mmol/L   Chloride 107 101 - 111 mmol/L   CO2 27 22 - 32 mmol/L   Glucose, Bld 88 65 - 99 mg/dL   BUN 22 (H) 6 - 20 mg/dL   Creatinine, Ser 1.19 (H) 0.44 - 1.00 mg/dL   Calcium 9.2 8.9 - 41.7 mg/dL   Phosphorus 1.7 (L) 2.5 - 4.6 mg/dL   Albumin 3.0 (L) 3.5 - 5.0 g/dL   GFR calc non Af Amer 29 (L) >60 mL/min   GFR calc Af Amer 34 (L) >60 mL/min   Anion gap 7 5 - 15  Basic metabolic panel   Collection Time: 07/18/15  6:29 AM  Result Value Ref Range   Sodium 143 135 - 145 mmol/L   Potassium 3.7 3.5 - 5.1 mmol/L   Chloride 112 (H) 101 - 111 mmol/L   CO2 26 22 - 32 mmol/L   Glucose, Bld 68 65 - 99 mg/dL   BUN 19 6 - 20 mg/dL   Creatinine, Ser 4.08 (H) 0.44 - 1.00 mg/dL   Calcium 8.9 8.9 - 14.4 mg/dL   GFR calc non Af Amer 34 (L) >60 mL/min   GFR calc Af Amer 39 (L) >60 mL/min   Anion gap 5 5 - 15  CBC   Collection Time: 07/18/15  6:29 AM  Result Value Ref Range   WBC 6.3 4.0 - 10.5 K/uL   RBC 3.26 (L) 3.87 - 5.11 MIL/uL   Hemoglobin 9.8 (L) 12.0 - 15.0 g/dL   HCT 81.8 (L) 56.3 - 14.9 %   MCV 96.9 78.0 - 100.0 fL   MCH 30.1 26.0 - 34.0 pg   MCHC 31.0 30.0 - 36.0 g/dL   RDW 70.2 (H) 63.7 - 85.8 %   Platelets 229 150 - 400 K/uL  Results for orders placed or performed during the hospital encounter of 03/24/15 (from the past 8736 hour(s))  CBC with Differential/Platelet   Collection Time: 03/24/15 12:29 PM  Result Value Ref Range   WBC 5.4 4.0 - 10.5 K/uL   RBC 3.75 (L) 3.87 - 5.11 MIL/uL   Hemoglobin 11.5 (L) 12.0 - 15.0 g/dL   HCT 85.0 27.7 - 41.2 %   MCV 98.1 78.0 - 100.0 fL   MCH 30.7 26.0 - 34.0 pg  MCHC 31.3 30.0 - 36.0 g/dL   RDW 54.014.2 98.111.5 - 19.115.5 %   Platelets 273 150 - 400 K/uL    Neutrophils Relative % 74 %   Neutro Abs 4.0 1.7 - 7.7 K/uL   Lymphocytes Relative 15 %   Lymphs Abs 0.8 0.7 - 4.0 K/uL   Monocytes Relative 8 %   Monocytes Absolute 0.4 0.1 - 1.0 K/uL   Eosinophils Relative 3 %   Eosinophils Absolute 0.2 0.0 - 0.7 K/uL   Basophils Relative 0 %   Basophils Absolute 0.0 0.0 - 0.1 K/uL  Basic metabolic panel   Collection Time: 03/24/15 12:29 PM  Result Value Ref Range   Sodium 138 135 - 145 mmol/L   Potassium 4.2 3.5 - 5.1 mmol/L   Chloride 104 101 - 111 mmol/L   CO2 27 22 - 32 mmol/L   Glucose, Bld 105 (H) 65 - 99 mg/dL   BUN 25 (H) 6 - 20 mg/dL   Creatinine, Ser 4.781.33 (H) 0.44 - 1.00 mg/dL   Calcium 9.7 8.9 - 29.510.3 mg/dL   GFR calc non Af Amer 38 (L) >60 mL/min   GFR calc Af Amer 44 (L) >60 mL/min   Anion gap 7 5 - 15  Protime-INR   Collection Time: 03/24/15 12:29 PM  Result Value Ref Range   Prothrombin Time 14.8 11.6 - 15.2 seconds   INR 1.14 0.00 - 1.49  APTT   Collection Time: 03/24/15 12:29 PM  Result Value Ref Range   aPTT 30 24 - 37 seconds  Wound culture   Collection Time: 03/24/15  3:40 PM  Result Value Ref Range   Specimen Description LEG    Special Requests NONE    Gram Stain      RARE WBC PRESENT, PREDOMINANTLY PMN RARE SQUAMOUS EPITHELIAL CELLS PRESENT ABUNDANT GRAM POSITIVE RODS MODERATE GRAM POSITIVE COCCI IN PAIRS IN CLUSTERS    Culture      ABUNDANT METHICILLIN RESISTANT STAPHYLOCOCCUS AUREUS Note: RIFAMPIN AND GENTAMICIN SHOULD NOT BE USED AS SINGLE DRUGS FOR TREATMENT OF STAPH INFECTIONS. This organism DOES NOT demonstrate inducible Clindamycin resistance in vitro. Performed at [facility]    Report Status 03/28/2015 FINAL    Organism ID, Bacteria METHICILLIN RESISTANT STAPHYLOCOCCUS AUREUS       Susceptibility   Methicillin resistant staphylococcus aureus - MIC*    CLINDAMYCIN <=0.25 SENSITIVE Sensitive     ERYTHROMYCIN >=8 RESISTANT Resistant     GENTAMICIN <=0.5 SENSITIVE Sensitive     LEVOFLOXACIN  >=8 RESISTANT Resistant     OXACILLIN >=4 RESISTANT Resistant     RIFAMPIN <=0.5 SENSITIVE Sensitive     TRIMETH/SULFA <=10 SENSITIVE Sensitive     VANCOMYCIN <=0.5 SENSITIVE Sensitive     TETRACYCLINE <=1 SENSITIVE Sensitive     * ABUNDANT METHICILLIN RESISTANT STAPHYLOCOCCUS AUREUS  Heparin level (unfractionated)   Collection Time: 03/24/15  8:55 PM  Result Value Ref Range   Heparin Unfractionated 0.69 0.30 - 0.70 IU/mL  Heparin level (unfractionated)   Collection Time: 03/25/15  5:55 AM  Result Value Ref Range   Heparin Unfractionated 0.98 (H) 0.30 - 0.70 IU/mL  CBC   Collection Time: 03/25/15  5:55 AM  Result Value Ref Range   WBC 5.1 4.0 - 10.5 K/uL   RBC 3.69 (L) 3.87 - 5.11 MIL/uL   Hemoglobin 11.2 (L) 12.0 - 15.0 g/dL   HCT 62.136.5 30.836.0 - 65.746.0 %   MCV 98.9 78.0 - 100.0 fL   MCH 30.4 26.0 - 34.0  pg   MCHC 30.7 30.0 - 36.0 g/dL   RDW 24.4 01.0 - 27.2 %   Platelets 293 150 - 400 K/uL  Basic metabolic panel   Collection Time: 03/25/15  5:55 AM  Result Value Ref Range   Sodium 141 135 - 145 mmol/L   Potassium 3.8 3.5 - 5.1 mmol/L   Chloride 106 101 - 111 mmol/L   CO2 29 22 - 32 mmol/L   Glucose, Bld 90 65 - 99 mg/dL   BUN 22 (H) 6 - 20 mg/dL   Creatinine, Ser 5.36 (H) 0.44 - 1.00 mg/dL   Calcium 9.2 8.9 - 64.4 mg/dL   GFR calc non Af Amer 36 (L) >60 mL/min   GFR calc Af Amer 42 (L) >60 mL/min   Anion gap 6 5 - 15  Heparin level (unfractionated)   Collection Time: 03/25/15  2:25 PM  Result Value Ref Range   Heparin Unfractionated 0.97 (H) 0.30 - 0.70 IU/mL  Heparin level (unfractionated)   Collection Time: 03/25/15  9:48 PM  Result Value Ref Range   Heparin Unfractionated 0.64 0.30 - 0.70 IU/mL  Heparin level (unfractionated)   Collection Time: 03/26/15  6:14 AM  Result Value Ref Range   Heparin Unfractionated 0.54 0.30 - 0.70 IU/mL  CBC   Collection Time: 03/26/15  6:14 AM  Result Value Ref Range   WBC 5.4 4.0 - 10.5 K/uL   RBC 3.48 (L) 3.87 - 5.11 MIL/uL    Hemoglobin 10.5 (L) 12.0 - 15.0 g/dL   HCT 03.4 (L) 74.2 - 59.5 %   MCV 99.4 78.0 - 100.0 fL   MCH 30.2 26.0 - 34.0 pg   MCHC 30.3 30.0 - 36.0 g/dL   RDW 63.8 75.6 - 43.3 %   Platelets 287 150 - 400 K/uL  Protime-INR   Collection Time: 03/26/15  6:14 AM  Result Value Ref Range   Prothrombin Time 14.7 11.6 - 15.2 seconds   INR 1.13 0.00 - 1.49  Heparin level (unfractionated)   Collection Time: 03/27/15  5:45 AM  Result Value Ref Range   Heparin Unfractionated 0.54 0.30 - 0.70 IU/mL  CBC   Collection Time: 03/27/15  5:45 AM  Result Value Ref Range   WBC 5.0 4.0 - 10.5 K/uL   RBC 3.55 (L) 3.87 - 5.11 MIL/uL   Hemoglobin 10.8 (L) 12.0 - 15.0 g/dL   HCT 29.5 (L) 18.8 - 41.6 %   MCV 98.3 78.0 - 100.0 fL   MCH 30.4 26.0 - 34.0 pg   MCHC 30.9 30.0 - 36.0 g/dL   RDW 60.6 30.1 - 60.1 %   Platelets 305 150 - 400 K/uL  Protime-INR   Collection Time: 03/27/15  5:45 AM  Result Value Ref Range   Prothrombin Time 16.9 (H) 11.6 - 15.2 seconds   INR 1.36 0.00 - 1.49   she had labs done last week with her primary care physician we will get the result Diagnoses 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 We'll continue Wellbutrin and Paxil for depression, Lamictal for mood stabilization, Risperdal for agitation and Cogentin for side effects from Risperdal. She'll take clonazepam 0.5 mg daily at 5 PM for at anxiety.    Time spent 15 minutes.  More than 50% of the time spent  and psychoeducation, counseling and coordination of care. She'll return in 4 months  MEDICATIONS this encounter: Meds ordered this encounter  Medications  . Warfarin Sodium (COUMADIN PO)    Sig: Take 5 mg by mouth as directed.  . protein supplement (RESOURCE BENEPROTEIN) 6 g  POWD    Sig: Take 1 scoop by mouth 2 (two) times daily with a meal.  . furosemide (LASIX) 20 MG tablet    Sig: Take 20 mg by mouth. Taking M, W, F  . enoxaparin (LOVENOX) 150 MG/ML injection    Sig: Inject 105 mg into the skin.  Marland Kitchen solifenacin (VESICARE) 10 MG tablet    Sig: Take 10 mg by mouth daily.  Marland Kitchen loratadine (CLARITIN) 10 MG tablet    Sig: Take 10 mg by mouth daily.  Marland Kitchen PARoxetine (PAXIL) 40 MG tablet    Sig: Take 1 tablet (40 mg total) by mouth at bedtime.    Dispense:  30 tablet    Refill:  3  . buPROPion (WELLBUTRIN XL) 300 MG 24 hr tablet    Sig: Take 1 tablet (300 mg total) by mouth every morning.    Dispense:  30 tablet    Refill:  3  . benztropine (COGENTIN) 0.5 MG tablet    Sig: Take 1 tablet (0.5 mg total) by mouth 2 (two) times daily.    Dispense:  60 tablet    Refill:  3  . risperiDONE (RISPERDAL) 1 MG tablet    Sig: Take 1 tablet (1 mg total) by mouth at bedtime.    Dispense:  30 tablet    Refill:  3  . risperiDONE (RISPERDAL) 0.25 MG tablet    Sig: Take 1 tablet (0.25 mg total) by mouth 3 (three) times daily.    Dispense:  90 tablet    Refill:  3  . DISCONTD: clonazePAM (KLONOPIN) 0.5 MG tablet    Sig: Take daily at 5 pm    Dispense:  30 tablet    Refill:  3  . clonazePAM (KLONOPIN) 0.5 MG tablet    Sig: Take daily at 5 pm    Dispense:  30 tablet    Refill:  2  . clonazePAM (KLONOPIN) 0.5 MG tablet    Sig: Take daily at 5 pm    Dispense:  30 tablet    Refill:  3    Medical Decision Making Problem Points:  Established problem, stable/improving (1), Established problem, worsening (2), New problem, with additional work-up planned (4), Review of last therapy session (1) and Review of psycho-social stressors (1) Data Points:  Review of medication regiment & side effects (2) Review of new medications or change in dosage (2)  I certify that outpatient services furnished can reasonably be expected to improve the patient's condition.   Diannia Ruder,  MD

## 2015-08-23 DIAGNOSIS — L89623 Pressure ulcer of left heel, stage 3: Secondary | ICD-10-CM | POA: Diagnosis not present

## 2015-08-29 ENCOUNTER — Ambulatory Visit (HOSPITAL_COMMUNITY): Payer: Self-pay | Admitting: Psychiatry

## 2015-08-30 ENCOUNTER — Ambulatory Visit (HOSPITAL_COMMUNITY): Payer: Self-pay | Admitting: Psychiatry

## 2015-09-04 DIAGNOSIS — I4891 Unspecified atrial fibrillation: Secondary | ICD-10-CM | POA: Diagnosis not present

## 2015-09-04 DIAGNOSIS — M6281 Muscle weakness (generalized): Secondary | ICD-10-CM | POA: Diagnosis not present

## 2015-09-04 DIAGNOSIS — I10 Essential (primary) hypertension: Secondary | ICD-10-CM | POA: Diagnosis not present

## 2015-09-04 DIAGNOSIS — F0390 Unspecified dementia without behavioral disturbance: Secondary | ICD-10-CM | POA: Diagnosis not present

## 2015-09-05 DIAGNOSIS — L89890 Pressure ulcer of other site, unstageable: Secondary | ICD-10-CM | POA: Diagnosis not present

## 2015-09-05 DIAGNOSIS — I10 Essential (primary) hypertension: Secondary | ICD-10-CM | POA: Diagnosis not present

## 2015-09-05 DIAGNOSIS — L89623 Pressure ulcer of left heel, stage 3: Secondary | ICD-10-CM | POA: Diagnosis not present

## 2015-09-05 DIAGNOSIS — M6281 Muscle weakness (generalized): Secondary | ICD-10-CM | POA: Diagnosis not present

## 2015-09-05 DIAGNOSIS — F0390 Unspecified dementia without behavioral disturbance: Secondary | ICD-10-CM | POA: Diagnosis not present

## 2015-09-06 DIAGNOSIS — F0390 Unspecified dementia without behavioral disturbance: Secondary | ICD-10-CM | POA: Diagnosis not present

## 2015-09-06 DIAGNOSIS — M6281 Muscle weakness (generalized): Secondary | ICD-10-CM | POA: Diagnosis not present

## 2015-09-06 DIAGNOSIS — I10 Essential (primary) hypertension: Secondary | ICD-10-CM | POA: Diagnosis not present

## 2015-09-07 DIAGNOSIS — L89603 Pressure ulcer of unspecified heel, stage 3: Secondary | ICD-10-CM | POA: Diagnosis not present

## 2015-09-07 DIAGNOSIS — M6281 Muscle weakness (generalized): Secondary | ICD-10-CM | POA: Diagnosis not present

## 2015-09-07 DIAGNOSIS — I10 Essential (primary) hypertension: Secondary | ICD-10-CM | POA: Diagnosis not present

## 2015-09-07 DIAGNOSIS — F0390 Unspecified dementia without behavioral disturbance: Secondary | ICD-10-CM | POA: Diagnosis not present

## 2015-09-07 DIAGNOSIS — I4891 Unspecified atrial fibrillation: Secondary | ICD-10-CM | POA: Diagnosis not present

## 2015-09-07 DIAGNOSIS — I8290 Acute embolism and thrombosis of unspecified vein: Secondary | ICD-10-CM | POA: Diagnosis not present

## 2015-09-07 DIAGNOSIS — E46 Unspecified protein-calorie malnutrition: Secondary | ICD-10-CM | POA: Diagnosis not present

## 2015-09-08 DIAGNOSIS — I10 Essential (primary) hypertension: Secondary | ICD-10-CM | POA: Diagnosis not present

## 2015-09-08 DIAGNOSIS — M6281 Muscle weakness (generalized): Secondary | ICD-10-CM | POA: Diagnosis not present

## 2015-09-08 DIAGNOSIS — F0390 Unspecified dementia without behavioral disturbance: Secondary | ICD-10-CM | POA: Diagnosis not present

## 2015-09-11 DIAGNOSIS — F0390 Unspecified dementia without behavioral disturbance: Secondary | ICD-10-CM | POA: Diagnosis not present

## 2015-09-11 DIAGNOSIS — M6281 Muscle weakness (generalized): Secondary | ICD-10-CM | POA: Diagnosis not present

## 2015-09-11 DIAGNOSIS — I10 Essential (primary) hypertension: Secondary | ICD-10-CM | POA: Diagnosis not present

## 2015-09-12 DIAGNOSIS — L89623 Pressure ulcer of left heel, stage 3: Secondary | ICD-10-CM | POA: Diagnosis not present

## 2015-09-12 DIAGNOSIS — M6281 Muscle weakness (generalized): Secondary | ICD-10-CM | POA: Diagnosis not present

## 2015-09-12 DIAGNOSIS — F0390 Unspecified dementia without behavioral disturbance: Secondary | ICD-10-CM | POA: Diagnosis not present

## 2015-09-12 DIAGNOSIS — I10 Essential (primary) hypertension: Secondary | ICD-10-CM | POA: Diagnosis not present

## 2015-09-13 DIAGNOSIS — F0390 Unspecified dementia without behavioral disturbance: Secondary | ICD-10-CM | POA: Diagnosis not present

## 2015-09-13 DIAGNOSIS — M6281 Muscle weakness (generalized): Secondary | ICD-10-CM | POA: Diagnosis not present

## 2015-09-13 DIAGNOSIS — I10 Essential (primary) hypertension: Secondary | ICD-10-CM | POA: Diagnosis not present

## 2015-09-14 DIAGNOSIS — I4891 Unspecified atrial fibrillation: Secondary | ICD-10-CM | POA: Diagnosis not present

## 2015-09-14 DIAGNOSIS — I10 Essential (primary) hypertension: Secondary | ICD-10-CM | POA: Diagnosis not present

## 2015-09-14 DIAGNOSIS — F0390 Unspecified dementia without behavioral disturbance: Secondary | ICD-10-CM | POA: Diagnosis not present

## 2015-09-14 DIAGNOSIS — M6281 Muscle weakness (generalized): Secondary | ICD-10-CM | POA: Diagnosis not present

## 2015-09-15 DIAGNOSIS — I10 Essential (primary) hypertension: Secondary | ICD-10-CM | POA: Diagnosis not present

## 2015-09-15 DIAGNOSIS — M6281 Muscle weakness (generalized): Secondary | ICD-10-CM | POA: Diagnosis not present

## 2015-09-15 DIAGNOSIS — F0390 Unspecified dementia without behavioral disturbance: Secondary | ICD-10-CM | POA: Diagnosis not present

## 2015-09-18 DIAGNOSIS — I10 Essential (primary) hypertension: Secondary | ICD-10-CM | POA: Diagnosis not present

## 2015-09-18 DIAGNOSIS — M6281 Muscle weakness (generalized): Secondary | ICD-10-CM | POA: Diagnosis not present

## 2015-09-18 DIAGNOSIS — F0390 Unspecified dementia without behavioral disturbance: Secondary | ICD-10-CM | POA: Diagnosis not present

## 2015-09-19 DIAGNOSIS — I10 Essential (primary) hypertension: Secondary | ICD-10-CM | POA: Diagnosis not present

## 2015-09-19 DIAGNOSIS — F0390 Unspecified dementia without behavioral disturbance: Secondary | ICD-10-CM | POA: Diagnosis not present

## 2015-09-19 DIAGNOSIS — M6281 Muscle weakness (generalized): Secondary | ICD-10-CM | POA: Diagnosis not present

## 2015-09-19 DIAGNOSIS — L97229 Non-pressure chronic ulcer of left calf with unspecified severity: Secondary | ICD-10-CM | POA: Diagnosis not present

## 2015-09-20 DIAGNOSIS — M6281 Muscle weakness (generalized): Secondary | ICD-10-CM | POA: Diagnosis not present

## 2015-09-20 DIAGNOSIS — F0390 Unspecified dementia without behavioral disturbance: Secondary | ICD-10-CM | POA: Diagnosis not present

## 2015-09-20 DIAGNOSIS — I10 Essential (primary) hypertension: Secondary | ICD-10-CM | POA: Diagnosis not present

## 2015-09-21 DIAGNOSIS — M6281 Muscle weakness (generalized): Secondary | ICD-10-CM | POA: Diagnosis not present

## 2015-09-21 DIAGNOSIS — Z7901 Long term (current) use of anticoagulants: Secondary | ICD-10-CM | POA: Diagnosis not present

## 2015-09-21 DIAGNOSIS — I10 Essential (primary) hypertension: Secondary | ICD-10-CM | POA: Diagnosis not present

## 2015-09-21 DIAGNOSIS — F0390 Unspecified dementia without behavioral disturbance: Secondary | ICD-10-CM | POA: Diagnosis not present

## 2015-09-21 DIAGNOSIS — I4891 Unspecified atrial fibrillation: Secondary | ICD-10-CM | POA: Diagnosis not present

## 2015-09-22 DIAGNOSIS — I10 Essential (primary) hypertension: Secondary | ICD-10-CM | POA: Diagnosis not present

## 2015-09-22 DIAGNOSIS — F0390 Unspecified dementia without behavioral disturbance: Secondary | ICD-10-CM | POA: Diagnosis not present

## 2015-09-22 DIAGNOSIS — M6281 Muscle weakness (generalized): Secondary | ICD-10-CM | POA: Diagnosis not present

## 2015-09-25 DIAGNOSIS — I4891 Unspecified atrial fibrillation: Secondary | ICD-10-CM | POA: Diagnosis not present

## 2015-09-25 DIAGNOSIS — I10 Essential (primary) hypertension: Secondary | ICD-10-CM | POA: Diagnosis not present

## 2015-09-25 DIAGNOSIS — I82421 Acute embolism and thrombosis of right iliac vein: Secondary | ICD-10-CM | POA: Diagnosis not present

## 2015-09-25 DIAGNOSIS — F0390 Unspecified dementia without behavioral disturbance: Secondary | ICD-10-CM | POA: Diagnosis not present

## 2015-09-25 DIAGNOSIS — M6281 Muscle weakness (generalized): Secondary | ICD-10-CM | POA: Diagnosis not present

## 2015-09-26 DIAGNOSIS — I10 Essential (primary) hypertension: Secondary | ICD-10-CM | POA: Diagnosis not present

## 2015-09-26 DIAGNOSIS — M6281 Muscle weakness (generalized): Secondary | ICD-10-CM | POA: Diagnosis not present

## 2015-09-26 DIAGNOSIS — L89623 Pressure ulcer of left heel, stage 3: Secondary | ICD-10-CM | POA: Diagnosis not present

## 2015-09-26 DIAGNOSIS — F0390 Unspecified dementia without behavioral disturbance: Secondary | ICD-10-CM | POA: Diagnosis not present

## 2015-09-27 DIAGNOSIS — M6281 Muscle weakness (generalized): Secondary | ICD-10-CM | POA: Diagnosis not present

## 2015-09-27 DIAGNOSIS — I10 Essential (primary) hypertension: Secondary | ICD-10-CM | POA: Diagnosis not present

## 2015-09-27 DIAGNOSIS — F0390 Unspecified dementia without behavioral disturbance: Secondary | ICD-10-CM | POA: Diagnosis not present

## 2015-09-28 DIAGNOSIS — I8290 Acute embolism and thrombosis of unspecified vein: Secondary | ICD-10-CM | POA: Diagnosis not present

## 2015-09-28 DIAGNOSIS — M6281 Muscle weakness (generalized): Secondary | ICD-10-CM | POA: Diagnosis not present

## 2015-09-28 DIAGNOSIS — D696 Thrombocytopenia, unspecified: Secondary | ICD-10-CM | POA: Diagnosis not present

## 2015-09-28 DIAGNOSIS — I10 Essential (primary) hypertension: Secondary | ICD-10-CM | POA: Diagnosis not present

## 2015-09-28 DIAGNOSIS — L89603 Pressure ulcer of unspecified heel, stage 3: Secondary | ICD-10-CM | POA: Diagnosis not present

## 2015-09-28 DIAGNOSIS — I4891 Unspecified atrial fibrillation: Secondary | ICD-10-CM | POA: Diagnosis not present

## 2015-09-28 DIAGNOSIS — J9811 Atelectasis: Secondary | ICD-10-CM | POA: Diagnosis not present

## 2015-09-28 DIAGNOSIS — F0390 Unspecified dementia without behavioral disturbance: Secondary | ICD-10-CM | POA: Diagnosis not present

## 2015-09-29 DIAGNOSIS — M6281 Muscle weakness (generalized): Secondary | ICD-10-CM | POA: Diagnosis not present

## 2015-09-29 DIAGNOSIS — F0390 Unspecified dementia without behavioral disturbance: Secondary | ICD-10-CM | POA: Diagnosis not present

## 2015-09-29 DIAGNOSIS — I10 Essential (primary) hypertension: Secondary | ICD-10-CM | POA: Diagnosis not present

## 2015-10-02 DIAGNOSIS — F0390 Unspecified dementia without behavioral disturbance: Secondary | ICD-10-CM | POA: Diagnosis not present

## 2015-10-02 DIAGNOSIS — M6281 Muscle weakness (generalized): Secondary | ICD-10-CM | POA: Diagnosis not present

## 2015-10-02 DIAGNOSIS — L89603 Pressure ulcer of unspecified heel, stage 3: Secondary | ICD-10-CM | POA: Diagnosis not present

## 2015-10-02 DIAGNOSIS — I82421 Acute embolism and thrombosis of right iliac vein: Secondary | ICD-10-CM | POA: Diagnosis not present

## 2015-10-02 DIAGNOSIS — I4891 Unspecified atrial fibrillation: Secondary | ICD-10-CM | POA: Diagnosis not present

## 2015-10-02 DIAGNOSIS — R627 Adult failure to thrive: Secondary | ICD-10-CM | POA: Diagnosis not present

## 2015-10-02 DIAGNOSIS — I10 Essential (primary) hypertension: Secondary | ICD-10-CM | POA: Diagnosis not present

## 2015-10-02 DIAGNOSIS — I8290 Acute embolism and thrombosis of unspecified vein: Secondary | ICD-10-CM | POA: Diagnosis not present

## 2015-10-03 DIAGNOSIS — F0390 Unspecified dementia without behavioral disturbance: Secondary | ICD-10-CM | POA: Diagnosis not present

## 2015-10-03 DIAGNOSIS — M6281 Muscle weakness (generalized): Secondary | ICD-10-CM | POA: Diagnosis not present

## 2015-10-03 DIAGNOSIS — I10 Essential (primary) hypertension: Secondary | ICD-10-CM | POA: Diagnosis not present

## 2015-10-04 DIAGNOSIS — M6281 Muscle weakness (generalized): Secondary | ICD-10-CM | POA: Diagnosis not present

## 2015-10-04 DIAGNOSIS — F0390 Unspecified dementia without behavioral disturbance: Secondary | ICD-10-CM | POA: Diagnosis not present

## 2015-10-04 DIAGNOSIS — I10 Essential (primary) hypertension: Secondary | ICD-10-CM | POA: Diagnosis not present

## 2015-10-05 DIAGNOSIS — F0390 Unspecified dementia without behavioral disturbance: Secondary | ICD-10-CM | POA: Diagnosis not present

## 2015-10-05 DIAGNOSIS — I4891 Unspecified atrial fibrillation: Secondary | ICD-10-CM | POA: Diagnosis not present

## 2015-10-05 DIAGNOSIS — D649 Anemia, unspecified: Secondary | ICD-10-CM | POA: Diagnosis not present

## 2015-10-05 DIAGNOSIS — M6281 Muscle weakness (generalized): Secondary | ICD-10-CM | POA: Diagnosis not present

## 2015-10-05 DIAGNOSIS — D72829 Elevated white blood cell count, unspecified: Secondary | ICD-10-CM | POA: Diagnosis not present

## 2015-10-05 DIAGNOSIS — I10 Essential (primary) hypertension: Secondary | ICD-10-CM | POA: Diagnosis not present

## 2015-10-05 DIAGNOSIS — R7989 Other specified abnormal findings of blood chemistry: Secondary | ICD-10-CM | POA: Diagnosis not present

## 2015-10-06 DIAGNOSIS — F0390 Unspecified dementia without behavioral disturbance: Secondary | ICD-10-CM | POA: Diagnosis not present

## 2015-10-06 DIAGNOSIS — M6281 Muscle weakness (generalized): Secondary | ICD-10-CM | POA: Diagnosis not present

## 2015-10-06 DIAGNOSIS — I10 Essential (primary) hypertension: Secondary | ICD-10-CM | POA: Diagnosis not present

## 2015-10-09 DIAGNOSIS — F0390 Unspecified dementia without behavioral disturbance: Secondary | ICD-10-CM | POA: Diagnosis not present

## 2015-10-09 DIAGNOSIS — I10 Essential (primary) hypertension: Secondary | ICD-10-CM | POA: Diagnosis not present

## 2015-10-09 DIAGNOSIS — L89603 Pressure ulcer of unspecified heel, stage 3: Secondary | ICD-10-CM | POA: Diagnosis not present

## 2015-10-09 DIAGNOSIS — I4891 Unspecified atrial fibrillation: Secondary | ICD-10-CM | POA: Diagnosis not present

## 2015-10-09 DIAGNOSIS — I8290 Acute embolism and thrombosis of unspecified vein: Secondary | ICD-10-CM | POA: Diagnosis not present

## 2015-10-09 DIAGNOSIS — M6281 Muscle weakness (generalized): Secondary | ICD-10-CM | POA: Diagnosis not present

## 2015-10-09 DIAGNOSIS — T148 Other injury of unspecified body region: Secondary | ICD-10-CM | POA: Diagnosis not present

## 2015-10-10 DIAGNOSIS — F0390 Unspecified dementia without behavioral disturbance: Secondary | ICD-10-CM | POA: Diagnosis not present

## 2015-10-10 DIAGNOSIS — M6281 Muscle weakness (generalized): Secondary | ICD-10-CM | POA: Diagnosis not present

## 2015-10-10 DIAGNOSIS — I10 Essential (primary) hypertension: Secondary | ICD-10-CM | POA: Diagnosis not present

## 2015-10-11 DIAGNOSIS — M6281 Muscle weakness (generalized): Secondary | ICD-10-CM | POA: Diagnosis not present

## 2015-10-11 DIAGNOSIS — I10 Essential (primary) hypertension: Secondary | ICD-10-CM | POA: Diagnosis not present

## 2015-10-11 DIAGNOSIS — F0390 Unspecified dementia without behavioral disturbance: Secondary | ICD-10-CM | POA: Diagnosis not present

## 2015-10-12 DIAGNOSIS — F0390 Unspecified dementia without behavioral disturbance: Secondary | ICD-10-CM | POA: Diagnosis not present

## 2015-10-12 DIAGNOSIS — I10 Essential (primary) hypertension: Secondary | ICD-10-CM | POA: Diagnosis not present

## 2015-10-12 DIAGNOSIS — M6281 Muscle weakness (generalized): Secondary | ICD-10-CM | POA: Diagnosis not present

## 2015-10-13 DIAGNOSIS — F0390 Unspecified dementia without behavioral disturbance: Secondary | ICD-10-CM | POA: Diagnosis not present

## 2015-10-13 DIAGNOSIS — M6281 Muscle weakness (generalized): Secondary | ICD-10-CM | POA: Diagnosis not present

## 2015-10-13 DIAGNOSIS — I10 Essential (primary) hypertension: Secondary | ICD-10-CM | POA: Diagnosis not present

## 2015-10-14 DIAGNOSIS — Z79899 Other long term (current) drug therapy: Secondary | ICD-10-CM | POA: Diagnosis not present

## 2015-10-14 DIAGNOSIS — N390 Urinary tract infection, site not specified: Secondary | ICD-10-CM | POA: Diagnosis not present

## 2015-10-14 DIAGNOSIS — R319 Hematuria, unspecified: Secondary | ICD-10-CM | POA: Diagnosis not present

## 2015-10-16 DIAGNOSIS — Z7901 Long term (current) use of anticoagulants: Secondary | ICD-10-CM | POA: Diagnosis not present

## 2015-10-16 DIAGNOSIS — R627 Adult failure to thrive: Secondary | ICD-10-CM | POA: Diagnosis not present

## 2015-10-16 DIAGNOSIS — I8290 Acute embolism and thrombosis of unspecified vein: Secondary | ICD-10-CM | POA: Diagnosis not present

## 2015-10-16 DIAGNOSIS — I4891 Unspecified atrial fibrillation: Secondary | ICD-10-CM | POA: Diagnosis not present

## 2015-10-16 DIAGNOSIS — N390 Urinary tract infection, site not specified: Secondary | ICD-10-CM | POA: Diagnosis not present

## 2015-10-18 DIAGNOSIS — F0390 Unspecified dementia without behavioral disturbance: Secondary | ICD-10-CM | POA: Diagnosis not present

## 2015-10-18 DIAGNOSIS — N390 Urinary tract infection, site not specified: Secondary | ICD-10-CM | POA: Diagnosis not present

## 2015-10-18 DIAGNOSIS — I4891 Unspecified atrial fibrillation: Secondary | ICD-10-CM | POA: Diagnosis not present

## 2015-10-18 DIAGNOSIS — Z7901 Long term (current) use of anticoagulants: Secondary | ICD-10-CM | POA: Diagnosis not present

## 2015-10-18 DIAGNOSIS — M6281 Muscle weakness (generalized): Secondary | ICD-10-CM | POA: Diagnosis not present

## 2015-10-18 DIAGNOSIS — I10 Essential (primary) hypertension: Secondary | ICD-10-CM | POA: Diagnosis not present

## 2015-10-19 DIAGNOSIS — N183 Chronic kidney disease, stage 3 (moderate): Secondary | ICD-10-CM | POA: Diagnosis not present

## 2015-10-19 DIAGNOSIS — N390 Urinary tract infection, site not specified: Secondary | ICD-10-CM | POA: Diagnosis not present

## 2015-10-19 DIAGNOSIS — M6281 Muscle weakness (generalized): Secondary | ICD-10-CM | POA: Diagnosis not present

## 2015-10-19 DIAGNOSIS — F0390 Unspecified dementia without behavioral disturbance: Secondary | ICD-10-CM | POA: Diagnosis not present

## 2015-10-19 DIAGNOSIS — I10 Essential (primary) hypertension: Secondary | ICD-10-CM | POA: Diagnosis not present

## 2015-10-19 DIAGNOSIS — I8290 Acute embolism and thrombosis of unspecified vein: Secondary | ICD-10-CM | POA: Diagnosis not present

## 2015-10-21 DIAGNOSIS — I4891 Unspecified atrial fibrillation: Secondary | ICD-10-CM | POA: Diagnosis not present

## 2015-10-21 DIAGNOSIS — D696 Thrombocytopenia, unspecified: Secondary | ICD-10-CM | POA: Diagnosis not present

## 2015-10-23 DIAGNOSIS — N183 Chronic kidney disease, stage 3 (moderate): Secondary | ICD-10-CM | POA: Diagnosis not present

## 2015-10-23 DIAGNOSIS — N390 Urinary tract infection, site not specified: Secondary | ICD-10-CM | POA: Diagnosis not present

## 2015-10-23 DIAGNOSIS — I8290 Acute embolism and thrombosis of unspecified vein: Secondary | ICD-10-CM | POA: Diagnosis not present

## 2015-10-23 DIAGNOSIS — R4182 Altered mental status, unspecified: Secondary | ICD-10-CM | POA: Diagnosis not present

## 2015-10-24 DIAGNOSIS — I10 Essential (primary) hypertension: Secondary | ICD-10-CM | POA: Diagnosis not present

## 2015-10-24 DIAGNOSIS — M6281 Muscle weakness (generalized): Secondary | ICD-10-CM | POA: Diagnosis not present

## 2015-10-24 DIAGNOSIS — D72829 Elevated white blood cell count, unspecified: Secondary | ICD-10-CM | POA: Diagnosis not present

## 2015-10-24 DIAGNOSIS — Z7901 Long term (current) use of anticoagulants: Secondary | ICD-10-CM | POA: Diagnosis not present

## 2015-10-24 DIAGNOSIS — I4891 Unspecified atrial fibrillation: Secondary | ICD-10-CM | POA: Diagnosis not present

## 2015-10-24 DIAGNOSIS — F0390 Unspecified dementia without behavioral disturbance: Secondary | ICD-10-CM | POA: Diagnosis not present

## 2015-10-25 DIAGNOSIS — F0390 Unspecified dementia without behavioral disturbance: Secondary | ICD-10-CM | POA: Diagnosis not present

## 2015-10-25 DIAGNOSIS — M6281 Muscle weakness (generalized): Secondary | ICD-10-CM | POA: Diagnosis not present

## 2015-10-25 DIAGNOSIS — I10 Essential (primary) hypertension: Secondary | ICD-10-CM | POA: Diagnosis not present

## 2015-10-26 DIAGNOSIS — F0390 Unspecified dementia without behavioral disturbance: Secondary | ICD-10-CM | POA: Diagnosis not present

## 2015-10-26 DIAGNOSIS — I10 Essential (primary) hypertension: Secondary | ICD-10-CM | POA: Diagnosis not present

## 2015-10-26 DIAGNOSIS — I82421 Acute embolism and thrombosis of right iliac vein: Secondary | ICD-10-CM | POA: Diagnosis not present

## 2015-10-26 DIAGNOSIS — I4891 Unspecified atrial fibrillation: Secondary | ICD-10-CM | POA: Diagnosis not present

## 2015-10-26 DIAGNOSIS — D72829 Elevated white blood cell count, unspecified: Secondary | ICD-10-CM | POA: Diagnosis not present

## 2015-10-26 DIAGNOSIS — M6281 Muscle weakness (generalized): Secondary | ICD-10-CM | POA: Diagnosis not present

## 2015-10-30 DIAGNOSIS — N390 Urinary tract infection, site not specified: Secondary | ICD-10-CM | POA: Diagnosis not present

## 2015-10-30 DIAGNOSIS — Z7901 Long term (current) use of anticoagulants: Secondary | ICD-10-CM | POA: Diagnosis not present

## 2015-10-30 DIAGNOSIS — K219 Gastro-esophageal reflux disease without esophagitis: Secondary | ICD-10-CM | POA: Diagnosis not present

## 2015-10-30 DIAGNOSIS — I8290 Acute embolism and thrombosis of unspecified vein: Secondary | ICD-10-CM | POA: Diagnosis not present

## 2015-10-30 DIAGNOSIS — I4891 Unspecified atrial fibrillation: Secondary | ICD-10-CM | POA: Diagnosis not present

## 2015-10-30 DIAGNOSIS — K5900 Constipation, unspecified: Secondary | ICD-10-CM | POA: Diagnosis not present

## 2015-11-02 DIAGNOSIS — D696 Thrombocytopenia, unspecified: Secondary | ICD-10-CM | POA: Diagnosis not present

## 2015-11-02 DIAGNOSIS — I4891 Unspecified atrial fibrillation: Secondary | ICD-10-CM | POA: Diagnosis not present

## 2015-11-06 DIAGNOSIS — I82421 Acute embolism and thrombosis of right iliac vein: Secondary | ICD-10-CM | POA: Diagnosis not present

## 2015-11-06 DIAGNOSIS — I4891 Unspecified atrial fibrillation: Secondary | ICD-10-CM | POA: Diagnosis not present

## 2015-11-07 DIAGNOSIS — M79675 Pain in left toe(s): Secondary | ICD-10-CM | POA: Diagnosis not present

## 2015-11-07 DIAGNOSIS — B351 Tinea unguium: Secondary | ICD-10-CM | POA: Diagnosis not present

## 2015-11-07 DIAGNOSIS — M79674 Pain in right toe(s): Secondary | ICD-10-CM | POA: Diagnosis not present

## 2015-11-09 DIAGNOSIS — D696 Thrombocytopenia, unspecified: Secondary | ICD-10-CM | POA: Diagnosis not present

## 2015-11-09 DIAGNOSIS — I4891 Unspecified atrial fibrillation: Secondary | ICD-10-CM | POA: Diagnosis not present

## 2015-11-14 DIAGNOSIS — I4891 Unspecified atrial fibrillation: Secondary | ICD-10-CM | POA: Diagnosis not present

## 2015-11-14 DIAGNOSIS — I82421 Acute embolism and thrombosis of right iliac vein: Secondary | ICD-10-CM | POA: Diagnosis not present

## 2015-11-20 DIAGNOSIS — I4891 Unspecified atrial fibrillation: Secondary | ICD-10-CM | POA: Diagnosis not present

## 2015-11-20 DIAGNOSIS — D72829 Elevated white blood cell count, unspecified: Secondary | ICD-10-CM | POA: Diagnosis not present

## 2015-11-20 DIAGNOSIS — D649 Anemia, unspecified: Secondary | ICD-10-CM | POA: Diagnosis not present

## 2015-11-20 DIAGNOSIS — I10 Essential (primary) hypertension: Secondary | ICD-10-CM | POA: Diagnosis not present

## 2015-11-20 DIAGNOSIS — I82421 Acute embolism and thrombosis of right iliac vein: Secondary | ICD-10-CM | POA: Diagnosis not present

## 2015-11-23 DIAGNOSIS — I8290 Acute embolism and thrombosis of unspecified vein: Secondary | ICD-10-CM | POA: Diagnosis not present

## 2015-11-23 DIAGNOSIS — D649 Anemia, unspecified: Secondary | ICD-10-CM | POA: Diagnosis not present

## 2015-11-23 DIAGNOSIS — D696 Thrombocytopenia, unspecified: Secondary | ICD-10-CM | POA: Diagnosis not present

## 2015-11-23 DIAGNOSIS — I4891 Unspecified atrial fibrillation: Secondary | ICD-10-CM | POA: Diagnosis not present

## 2015-11-23 DIAGNOSIS — I10 Essential (primary) hypertension: Secondary | ICD-10-CM | POA: Diagnosis not present

## 2015-11-23 DIAGNOSIS — N183 Chronic kidney disease, stage 3 (moderate): Secondary | ICD-10-CM | POA: Diagnosis not present

## 2015-11-30 DIAGNOSIS — I4891 Unspecified atrial fibrillation: Secondary | ICD-10-CM | POA: Diagnosis not present

## 2015-11-30 DIAGNOSIS — I10 Essential (primary) hypertension: Secondary | ICD-10-CM | POA: Diagnosis not present

## 2015-11-30 DIAGNOSIS — I82421 Acute embolism and thrombosis of right iliac vein: Secondary | ICD-10-CM | POA: Diagnosis not present

## 2015-11-30 DIAGNOSIS — I8290 Acute embolism and thrombosis of unspecified vein: Secondary | ICD-10-CM | POA: Diagnosis not present

## 2015-12-07 DIAGNOSIS — I4891 Unspecified atrial fibrillation: Secondary | ICD-10-CM | POA: Diagnosis not present

## 2015-12-07 DIAGNOSIS — I82421 Acute embolism and thrombosis of right iliac vein: Secondary | ICD-10-CM | POA: Diagnosis not present

## 2015-12-14 DIAGNOSIS — D696 Thrombocytopenia, unspecified: Secondary | ICD-10-CM | POA: Diagnosis not present

## 2015-12-14 DIAGNOSIS — I4891 Unspecified atrial fibrillation: Secondary | ICD-10-CM | POA: Diagnosis not present

## 2015-12-18 DIAGNOSIS — K5900 Constipation, unspecified: Secondary | ICD-10-CM | POA: Diagnosis not present

## 2015-12-18 DIAGNOSIS — I8290 Acute embolism and thrombosis of unspecified vein: Secondary | ICD-10-CM | POA: Diagnosis not present

## 2015-12-18 DIAGNOSIS — Z7901 Long term (current) use of anticoagulants: Secondary | ICD-10-CM | POA: Diagnosis not present

## 2015-12-19 DIAGNOSIS — Z86718 Personal history of other venous thrombosis and embolism: Secondary | ICD-10-CM | POA: Diagnosis not present

## 2015-12-19 DIAGNOSIS — R791 Abnormal coagulation profile: Secondary | ICD-10-CM | POA: Diagnosis not present

## 2015-12-19 DIAGNOSIS — Z7901 Long term (current) use of anticoagulants: Secondary | ICD-10-CM | POA: Diagnosis not present

## 2015-12-20 DIAGNOSIS — Z7901 Long term (current) use of anticoagulants: Secondary | ICD-10-CM | POA: Diagnosis not present

## 2015-12-22 ENCOUNTER — Ambulatory Visit (INDEPENDENT_AMBULATORY_CARE_PROVIDER_SITE_OTHER): Payer: Medicare Other | Admitting: Psychiatry

## 2015-12-22 ENCOUNTER — Encounter (HOSPITAL_COMMUNITY): Payer: Self-pay | Admitting: Psychiatry

## 2015-12-22 VITALS — BP 110/55 | HR 68

## 2015-12-22 DIAGNOSIS — F331 Major depressive disorder, recurrent, moderate: Secondary | ICD-10-CM

## 2015-12-22 MED ORDER — CLONAZEPAM 0.5 MG PO TABS: ORAL_TABLET | ORAL | 3 refills | Status: DC

## 2015-12-22 MED ORDER — RISPERIDONE 1 MG PO TABS: 1.0000 mg | ORAL_TABLET | Freq: Every day | ORAL | 3 refills | Status: DC

## 2015-12-22 MED ORDER — RISPERIDONE 0.25 MG PO TABS: 0.2500 mg | ORAL_TABLET | Freq: Three times a day (TID) | ORAL | 3 refills | Status: DC

## 2015-12-22 MED ORDER — DONEPEZIL HCL 5 MG PO TABS: 5.0000 mg | ORAL_TABLET | Freq: Every day | ORAL | 2 refills | Status: DC

## 2015-12-22 MED ORDER — LAMOTRIGINE 100 MG PO TABS: 100.0000 mg | ORAL_TABLET | Freq: Two times a day (BID) | ORAL | 2 refills | Status: DC

## 2015-12-22 MED ORDER — PAROXETINE HCL 40 MG PO TABS: 40.0000 mg | ORAL_TABLET | Freq: Every day | ORAL | 3 refills | Status: DC

## 2015-12-22 MED ORDER — BENZTROPINE MESYLATE 0.5 MG PO TABS: 0.5000 mg | ORAL_TABLET | Freq: Two times a day (BID) | ORAL | 3 refills | Status: DC

## 2015-12-22 MED ORDER — BUPROPION HCL ER (XL) 300 MG PO TB24
300.0000 mg | ORAL_TABLET | ORAL | 3 refills | Status: DC
Start: 2015-12-22 — End: 2016-06-11

## 2015-12-22 NOTE — Progress Notes (Signed)
Patient ID: Amy Clay, female   DOB: 11/15/1940, 75 y.o.   MRN: [id number] Patient ID: Amy Clay, female   DOB: 01-Jul-1940, 75 y.o.   MRN: [id number] Patient ID: Amy Clay, female   DOB: 15-Sep-1940, 75 y.o.   MRN: [id number] Patient ID: Amy Clay, female   DOB: Aug 13, 1940, 75 y.o.   MRN: [id number] Patient ID: Amy Clay, female   DOB: 21-Sep-1940, 75 y.o.   MRN: [id number] Patient ID: Amy Clay, female   DOB: Sep 26, 1940, 75 y.o.   MRN: [id number] Patient ID: Amy Clay, female   DOB: Nov 16, 1940, 75 y.o.   MRN: [id number] Patient ID: Amy Clay, female   DOB: 1941/01/08, 75 y.o.   MRN: [id number] Patient ID: Amy Clay, female   DOB: [birth year], 75 y.o.   MRN: [id number] Patient ID: Amy Clay, female   DOB: 1940-05-23, 75 y.o.   MRN: [id number] Patient ID: Amy Clay, female   DOB: Jun 08, 1940, 75 y.o.   MRN: [id number] Patient ID: Amy Clay, female   DOB: [birth year], 75 y.o.   MRN: [id number] Patient ID: Amy Clay, female   DOB: Dec 13, 1940, 75 y.o.   MRN: [id number] Patient ID: Amy Clay, female   DOB: Jun 23, 1940, 75 y.o.   MRN: [id number] [facility] [id number] Progress Note Amy Clay MRN: [id number] DOB: 08-04-1940 Age: 75 y.o.  Date: 12/22/2015  Chief Complaint  Patient presents with  . Anxiety  . Depression  . Follow-up   History of presenting illness Patient is 75 year old Caucasian female who came with her sister for her followup appointment. She lives in the [facility] assisted [facility] facility. She and her sister had been living together in the family farm outside of [place]. Neither one has ever been married or have had children. The patient worked until the early 80s when she began to have difficulties with her nerves.  Currently the patient is still struggling with her mental and physical health. She was admitted last year to a psychiatric hospital in [place] because she was anxious nervous and depressed. Last time  Dr. Lolly Mustache increased her Risperdal which is helped somewhat. She can't walk and it's not clear why much of it seems to be anxiety based and she seems to have a fear of falling. She's also recently developed possible renal failure and is can be referred to a nephrologist. She was on lithium for a number of years. We do not have her  recent records from her primary care physician who is outside of [place]  The patient returns after 4 months. She's having ulcers on her feet and is unable to walk. She needs a lift to get out of bed now and is in a wheelchair. Her sister said she was doing fairly well until July and then she began to cry more cousin's anniversary of her father's death. Today she is perseverating about "losing the house." She states she is depressed at some points but not all the time. It seems as if the dementia is progressing although she still oriented to person place and situation. She gets nervous in the early evening but she is given clonazepam. She is on 2 antidepressants and Risperdal and I explained to the sister that there really is a much more we can add with medication given her age  Past psychiatric history Patient is seeing psychiatrist since 1997 in this office.  Her last psychiatric admission was at [facility] after having suicidal thoughts.  She denies any history of suicidal attempt however endorse history of hopeless feeling.  She is diagnosed with major depressive disorder. She she was very stable on lithium until her creatinine went up and lithium was discontinued.  We had recently tried Lamictal and the dose has been increased recently.  In the past we had tried BuSpar which was discontinued at hospital.  She has another psychiatric inpatient treatment in 90s. She admitted history of mania, depression and psychosis.  Social history Patient lives in a nursing home.  She has no children.  Her husband died in nursing home.  Patient has no other  family.  Medical history Patient was recently admitted due to dehydration and UTI.  Patient has history of arthritis blood pressure and chronic pain. Her primary care physician is the provider for the nursing home  She takes medication for her blood pressure and arthritis.  She had history of jerking movements, fall and generalized pain. Her last blood work was done on 02/14/2012 which shows anemia, her creatinine was 1.33.  She was given antibiotic result UTI.  Her WBC count was 10.4.  Family History family history includes Anxiety disorder in her paternal aunt and paternal uncle; Bipolar disorder in her cousin.  ROS   Mental status examination Patient is casually dressed and fairly groomed. She is in a wheelchair. Her feet are in pressure boots   She described her mood as anxious Her voice is high-pitched and soft.  .Her affect is also anxious Her attention and concentration are poor today  She said very little today  She doesn't have auditory or visual hallucination.  There were no paranoia or delusion obsession present at this time.  Her fund of knowledge is poor. Her orientation is to person and situation  There are no tremors and shakes in her hand.  She's alert  Her insight judgment are poor and impulse control is okay.  Lab Results:  Results for orders placed or performed during the hospital encounter of 07/15/15 (from the past 8736 hour(s))  Urine culture   Collection Time: 07/15/15  2:30 AM  Result Value Ref Range   Specimen Description URINE, CATHETERIZED    Special Requests NONE    Culture      >=100,000 COLONIES/mL ENTEROCOCCUS SPECIES Performed [facility]    Report Status 07/18/2015 FINAL    Organism ID, Bacteria ENTEROCOCCUS SPECIES       Susceptibility   Enterococcus species - MIC*    AMPICILLIN 16 RESISTANT Resistant     LEVOFLOXACIN >=8 RESISTANT Resistant     NITROFURANTOIN 256 RESISTANT Resistant     VANCOMYCIN <=0.5 SENSITIVE Sensitive     LINEZOLID 2  SENSITIVE Sensitive     * >=100,000 COLONIES/mL ENTEROCOCCUS SPECIES  Urinalysis, Routine w reflex microscopic (not at [facility])   Collection Time: 07/15/15  2:30 AM  Result Value Ref Range   Color, Urine YELLOW YELLOW   APPearance CLOUDY (A) CLEAR   Specific Gravity, Urine 1.020 1.005 - 1.030   pH 5.5 5.0 - 8.0   Glucose, UA NEGATIVE NEGATIVE mg/dL   Hgb urine dipstick TRACE (A) NEGATIVE   Bilirubin Urine NEGATIVE NEGATIVE   Ketones, ur NEGATIVE NEGATIVE mg/dL   Protein, ur NEGATIVE NEGATIVE mg/dL   Nitrite NEGATIVE NEGATIVE   Leukocytes, UA MODERATE (A) NEGATIVE  Urine microscopic-add on   Collection Time: 07/15/15  2:30 AM  Result Value Ref Range   Squamous Epithelial / LPF 0-5 (A) NONE SEEN   WBC, UA TOO NUMEROUS TO COUNT 0 - 5 WBC/hpf   RBC / HPF 0-5 0 -  5 RBC/hpf   Bacteria, UA MANY (A) NONE SEEN  CBC with Differential/Platelet   Collection Time: 07/15/15  3:05 AM  Result Value Ref Range   WBC 8.2 4.0 - 10.5 K/uL   RBC 3.90 3.87 - 5.11 MIL/uL   Hemoglobin 11.8 (L) 12.0 - 15.0 g/dL   HCT 16.1 09.6 - 04.5 %   MCV 97.7 78.0 - 100.0 fL   MCH 30.3 26.0 - 34.0 pg   MCHC 31.0 30.0 - 36.0 g/dL   RDW 40.9 (H) 81.1 - 91.4 %   Platelets 218 150 - 400 K/uL   Neutrophils Relative % 79 %   Neutro Abs 6.3 1.7 - 7.7 K/uL   Lymphocytes Relative 13 %   Lymphs Abs 1.1 0.7 - 4.0 K/uL   Monocytes Relative 6 %   Monocytes Absolute 0.5 0.1 - 1.0 K/uL   Eosinophils Relative 2 %   Eosinophils Absolute 0.2 0.0 - 0.7 K/uL   Basophils Relative 0 %   Basophils Absolute 0.0 0.0 - 0.1 K/uL  Comprehensive metabolic panel   Collection Time: 07/15/15  3:05 AM  Result Value Ref Range   Sodium 141 135 - 145 mmol/L   Potassium 3.7 3.5 - 5.1 mmol/L   Chloride 104 101 - 111 mmol/L   CO2 24 22 - 32 mmol/L   Glucose, Bld 104 (H) 65 - 99 mg/dL   BUN 35 (H) 6 - 20 mg/dL   Creatinine, Ser 7.82 (H) 0.44 - 1.00 mg/dL   Calcium 95.6 8.9 - 21.3 mg/dL   Total Protein 7.0 6.5 - 8.1 g/dL   Albumin 3.4 (L)  3.5 - 5.0 g/dL   AST 40 15 - 41 U/L   ALT 22 14 - 54 U/L   Alkaline Phosphatase 85 38 - 126 U/L   Total Bilirubin 0.7 0.3 - 1.2 mg/dL   GFR calc non Af Amer 22 (L) >60 mL/min   GFR calc Af Amer 25 (L) >60 mL/min   Anion gap 13 5 - 15  Troponin I   Collection Time: 07/15/15  3:05 AM  Result Value Ref Range   Troponin I <0.03 <0.031 ng/mL  MRSA PCR Screening   Collection Time: 07/15/15  5:39 AM  Result Value Ref Range   MRSA by PCR NEGATIVE NEGATIVE  CBC   Collection Time: 07/15/15  6:34 AM  Result Value Ref Range   WBC 5.3 4.0 - 10.5 K/uL   RBC 3.53 (L) 3.87 - 5.11 MIL/uL   Hemoglobin 10.8 (L) 12.0 - 15.0 g/dL   HCT 08.6 (L) 57.8 - 46.9 %   MCV 97.7 78.0 - 100.0 fL   MCH 30.6 26.0 - 34.0 pg   MCHC 31.3 30.0 - 36.0 g/dL   RDW 62.9 (H) 52.8 - 41.3 %   Platelets 193 150 - 400 K/uL  Creatinine, serum   Collection Time: 07/15/15  6:34 AM  Result Value Ref Range   Creatinine, Ser 2.20 (H) 0.44 - 1.00 mg/dL   GFR calc non Af Amer 21 (L) >60 mL/min   GFR calc Af Amer 24 (L) >60 mL/min  TSH   Collection Time: 07/15/15  6:34 AM  Result Value Ref Range   TSH 0.588 0.350 - 4.500 uIU/mL  Basic metabolic panel   Collection Time: 07/16/15  7:11 AM  Result Value Ref Range   Sodium 140 135 - 145 mmol/L   Potassium 3.5 3.5 - 5.1 mmol/L   Chloride 104 101 - 111 mmol/L   CO2 28 22 -  32 mmol/L   Glucose, Bld 81 65 - 99 mg/dL   BUN 32 (H) 6 - 20 mg/dL   Creatinine, Ser 9.60 (H) 0.44 - 1.00 mg/dL   Calcium 9.2 8.9 - 45.4 mg/dL   GFR calc non Af Amer 22 (L) >60 mL/min   GFR calc Af Amer 26 (L) >60 mL/min   Anion gap 8 5 - 15  CBC   Collection Time: 07/16/15  7:11 AM  Result Value Ref Range   WBC 4.4 4.0 - 10.5 K/uL   RBC 3.34 (L) 3.87 - 5.11 MIL/uL   Hemoglobin 10.3 (L) 12.0 - 15.0 g/dL   HCT 09.8 (L) 11.9 - 14.7 %   MCV 97.0 78.0 - 100.0 fL   MCH 30.8 26.0 - 34.0 pg   MCHC 31.8 30.0 - 36.0 g/dL   RDW 82.9 (H) 56.2 - 13.0 %   Platelets 194 150 - 400 K/uL  Renal function panel    Collection Time: 07/17/15  7:53 AM  Result Value Ref Range   Sodium 141 135 - 145 mmol/L   Potassium 3.5 3.5 - 5.1 mmol/L   Chloride 107 101 - 111 mmol/L   CO2 27 22 - 32 mmol/L   Glucose, Bld 88 65 - 99 mg/dL   BUN 22 (H) 6 - 20 mg/dL   Creatinine, Ser 8.65 (H) 0.44 - 1.00 mg/dL   Calcium 9.2 8.9 - 78.4 mg/dL   Phosphorus 1.7 (L) 2.5 - 4.6 mg/dL   Albumin 3.0 (L) 3.5 - 5.0 g/dL   GFR calc non Af Amer 29 (L) >60 mL/min   GFR calc Af Amer 34 (L) >60 mL/min   Anion gap 7 5 - 15  Basic metabolic panel   Collection Time: 07/18/15  6:29 AM  Result Value Ref Range   Sodium 143 135 - 145 mmol/L   Potassium 3.7 3.5 - 5.1 mmol/L   Chloride 112 (H) 101 - 111 mmol/L   CO2 26 22 - 32 mmol/L   Glucose, Bld 68 65 - 99 mg/dL   BUN 19 6 - 20 mg/dL   Creatinine, Ser 6.96 (H) 0.44 - 1.00 mg/dL   Calcium 8.9 8.9 - 29.5 mg/dL   GFR calc non Af Amer 34 (L) >60 mL/min   GFR calc Af Amer 39 (L) >60 mL/min   Anion gap 5 5 - 15  CBC   Collection Time: 07/18/15  6:29 AM  Result Value Ref Range   WBC 6.3 4.0 - 10.5 K/uL   RBC 3.26 (L) 3.87 - 5.11 MIL/uL   Hemoglobin 9.8 (L) 12.0 - 15.0 g/dL   HCT 28.4 (L) 13.2 - 44.0 %   MCV 96.9 78.0 - 100.0 fL   MCH 30.1 26.0 - 34.0 pg   MCHC 31.0 30.0 - 36.0 g/dL   RDW 10.2 (H) 72.5 - 36.6 %   Platelets 229 150 - 400 K/uL  Results for orders placed or performed during the hospital encounter of 03/24/15 (from the past 8736 hour(s))  CBC with Differential/Platelet   Collection Time: 03/24/15 12:29 PM  Result Value Ref Range   WBC 5.4 4.0 - 10.5 K/uL   RBC 3.75 (L) 3.87 - 5.11 MIL/uL   Hemoglobin 11.5 (L) 12.0 - 15.0 g/dL   HCT 44.0 34.7 - 42.5 %   MCV 98.1 78.0 - 100.0 fL   MCH 30.7 26.0 - 34.0 pg   MCHC 31.3 30.0 - 36.0 g/dL   RDW 95.6 38.7 - 56.4 %   Platelets 273  150 - 400 K/uL   Neutrophils Relative % 74 %   Neutro Abs 4.0 1.7 - 7.7 K/uL   Lymphocytes Relative 15 %   Lymphs Abs 0.8 0.7 - 4.0 K/uL   Monocytes Relative 8 %   Monocytes Absolute  0.4 0.1 - 1.0 K/uL   Eosinophils Relative 3 %   Eosinophils Absolute 0.2 0.0 - 0.7 K/uL   Basophils Relative 0 %   Basophils Absolute 0.0 0.0 - 0.1 K/uL  Basic metabolic panel   Collection Time: 03/24/15 12:29 PM  Result Value Ref Range   Sodium 138 135 - 145 mmol/L   Potassium 4.2 3.5 - 5.1 mmol/L   Chloride 104 101 - 111 mmol/L   CO2 27 22 - 32 mmol/L   Glucose, Bld 105 (H) 65 - 99 mg/dL   BUN 25 (H) 6 - 20 mg/dL   Creatinine, Ser 9.60 (H) 0.44 - 1.00 mg/dL   Calcium 9.7 8.9 - 45.4 mg/dL   GFR calc non Af Amer 38 (L) >60 mL/min   GFR calc Af Amer 44 (L) >60 mL/min   Anion gap 7 5 - 15  Protime-INR   Collection Time: 03/24/15 12:29 PM  Result Value Ref Range   Prothrombin Time 14.8 11.6 - 15.2 seconds   INR 1.14 0.00 - 1.49  APTT   Collection Time: 03/24/15 12:29 PM  Result Value Ref Range   aPTT 30 24 - 37 seconds  Wound culture   Collection Time: 03/24/15  3:40 PM  Result Value Ref Range   Specimen Description LEG    Special Requests NONE    Gram Stain      RARE WBC PRESENT, PREDOMINANTLY PMN RARE SQUAMOUS EPITHELIAL CELLS PRESENT ABUNDANT GRAM POSITIVE RODS MODERATE GRAM POSITIVE COCCI IN PAIRS IN CLUSTERS    Culture      ABUNDANT METHICILLIN RESISTANT STAPHYLOCOCCUS AUREUS Note: RIFAMPIN AND GENTAMICIN SHOULD NOT BE USED AS SINGLE DRUGS FOR TREATMENT OF STAPH INFECTIONS. This organism DOES NOT demonstrate inducible Clindamycin resistance in vitro. Performed at [facility]    Report Status 03/28/2015 FINAL    Organism ID, Bacteria METHICILLIN RESISTANT STAPHYLOCOCCUS AUREUS       Susceptibility   Methicillin resistant staphylococcus aureus - MIC*    CLINDAMYCIN <=0.25 SENSITIVE Sensitive     ERYTHROMYCIN >=8 RESISTANT Resistant     GENTAMICIN <=0.5 SENSITIVE Sensitive     LEVOFLOXACIN >=8 RESISTANT Resistant     OXACILLIN >=4 RESISTANT Resistant     RIFAMPIN <=0.5 SENSITIVE Sensitive     TRIMETH/SULFA <=10 SENSITIVE Sensitive     VANCOMYCIN <=0.5  SENSITIVE Sensitive     TETRACYCLINE <=1 SENSITIVE Sensitive     * ABUNDANT METHICILLIN RESISTANT STAPHYLOCOCCUS AUREUS  Heparin level (unfractionated)   Collection Time: 03/24/15  8:55 PM  Result Value Ref Range   Heparin Unfractionated 0.69 0.30 - 0.70 IU/mL  Heparin level (unfractionated)   Collection Time: 03/25/15  5:55 AM  Result Value Ref Range   Heparin Unfractionated 0.98 (H) 0.30 - 0.70 IU/mL  CBC   Collection Time: 03/25/15  5:55 AM  Result Value Ref Range   WBC 5.1 4.0 - 10.5 K/uL   RBC 3.69 (L) 3.87 - 5.11 MIL/uL   Hemoglobin 11.2 (L) 12.0 - 15.0 g/dL   HCT 09.8 11.9 - 14.7 %   MCV 98.9 78.0 - 100.0 fL   MCH 30.4 26.0 - 34.0 pg   MCHC 30.7 30.0 - 36.0 g/dL   RDW 82.9 56.2 - 13.0 %  Platelets 293 150 - 400 K/uL  Basic metabolic panel   Collection Time: 03/25/15  5:55 AM  Result Value Ref Range   Sodium 141 135 - 145 mmol/L   Potassium 3.8 3.5 - 5.1 mmol/L   Chloride 106 101 - 111 mmol/L   CO2 29 22 - 32 mmol/L   Glucose, Bld 90 65 - 99 mg/dL   BUN 22 (H) 6 - 20 mg/dL   Creatinine, Ser 1.61 (H) 0.44 - 1.00 mg/dL   Calcium 9.2 8.9 - 09.6 mg/dL   GFR calc non Af Amer 36 (L) >60 mL/min   GFR calc Af Amer 42 (L) >60 mL/min   Anion gap 6 5 - 15  Heparin level (unfractionated)   Collection Time: 03/25/15  2:25 PM  Result Value Ref Range   Heparin Unfractionated 0.97 (H) 0.30 - 0.70 IU/mL  Heparin level (unfractionated)   Collection Time: 03/25/15  9:48 PM  Result Value Ref Range   Heparin Unfractionated 0.64 0.30 - 0.70 IU/mL  Heparin level (unfractionated)   Collection Time: 03/26/15  6:14 AM  Result Value Ref Range   Heparin Unfractionated 0.54 0.30 - 0.70 IU/mL  CBC   Collection Time: 03/26/15  6:14 AM  Result Value Ref Range   WBC 5.4 4.0 - 10.5 K/uL   RBC 3.48 (L) 3.87 - 5.11 MIL/uL   Hemoglobin 10.5 (L) 12.0 - 15.0 g/dL   HCT 04.5 (L) 40.9 - 81.1 %   MCV 99.4 78.0 - 100.0 fL   MCH 30.2 26.0 - 34.0 pg   MCHC 30.3 30.0 - 36.0 g/dL   RDW 91.4 78.2 -  95.6 %   Platelets 287 150 - 400 K/uL  Protime-INR   Collection Time: 03/26/15  6:14 AM  Result Value Ref Range   Prothrombin Time 14.7 11.6 - 15.2 seconds   INR 1.13 0.00 - 1.49  Heparin level (unfractionated)   Collection Time: 03/27/15  5:45 AM  Result Value Ref Range   Heparin Unfractionated 0.54 0.30 - 0.70 IU/mL  CBC   Collection Time: 03/27/15  5:45 AM  Result Value Ref Range   WBC 5.0 4.0 - 10.5 K/uL   RBC 3.55 (L) 3.87 - 5.11 MIL/uL   Hemoglobin 10.8 (L) 12.0 - 15.0 g/dL   HCT 21.3 (L) 08.6 - 57.8 %   MCV 98.3 78.0 - 100.0 fL   MCH 30.4 26.0 - 34.0 pg   MCHC 30.9 30.0 - 36.0 g/dL   RDW 46.9 62.9 - 52.8 %   Platelets 305 150 - 400 K/uL  Protime-INR   Collection Time: 03/27/15  5:45 AM  Result Value Ref Range   Prothrombin Time 16.9 (H) 11.6 - 15.2 seconds   INR 1.36 0.00 - 1.49   she had labs done last week with her primary care physician we will get the result Diagnoses 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 We'll continue Wellbutrin and Paxil for depression, Lamictal for mood stabilization, Risperdal for agitation and Cogentin for side effects from Risperdal. She'll take clonazepam 0.5 mg daily at 5 PM for at anxiety.    Time spent 15 minutes.  More than 50% of the time spent and psychoeducation, counseling and coordination of care. She'll return in 4 months  MEDICATIONS this encounter: Meds ordered this  encounter  Medications  . buPROPion (WELLBUTRIN XL) 300 MG 24 hr tablet    Sig: Take 1 tablet (300 mg total) by mouth every morning.    Dispense:  30 tablet    Refill:  3  . benztropine (COGENTIN) 0.5 MG tablet    Sig: Take 1 tablet (0.5 mg total) by mouth 2 (two) times daily.    Dispense:  60 tablet    Refill:   3  . PARoxetine (PAXIL) 40 MG tablet    Sig: Take 1 tablet (40 mg total) by mouth at bedtime.    Dispense:  30 tablet    Refill:  3  . risperiDONE (RISPERDAL) 0.25 MG tablet    Sig: Take 1 tablet (0.25 mg total) by mouth 3 (three) times daily.    Dispense:  90 tablet    Refill:  3  . risperiDONE (RISPERDAL) 1 MG tablet    Sig: Take 1 tablet (1 mg total) by mouth at bedtime.    Dispense:  30 tablet    Refill:  3  . donepezil (ARICEPT) 5 MG tablet    Sig: Take 1 tablet (5 mg total) by mouth at bedtime.    Dispense:  30 tablet    Refill:  2  . lamoTRIgine (LAMICTAL) 100 MG tablet    Sig: Take 1 tablet (100 mg total) by mouth 2 (two) times daily.    Dispense:  60 tablet    Refill:  2  . clonazePAM (KLONOPIN) 0.5 MG tablet    Sig: Take daily at 5 pm    Dispense:  30 tablet    Refill:  3    Medical Decision Making Problem Points:  Established problem, stable/improving (1), Established problem, worsening (2), New problem, with additional work-up planned (4), Review of last therapy session (1) and Review of psycho-social stressors (1) Data Points:  Review of medication regiment & side effects (2) Review of new medications or change in dosage (2)  I certify that outpatient services furnished can reasonably be expected to improve the patient's condition.   Diannia Ruder, MD

## 2015-12-23 DIAGNOSIS — I10 Essential (primary) hypertension: Secondary | ICD-10-CM | POA: Diagnosis not present

## 2015-12-23 DIAGNOSIS — I8290 Acute embolism and thrombosis of unspecified vein: Secondary | ICD-10-CM | POA: Diagnosis not present

## 2015-12-28 DIAGNOSIS — D696 Thrombocytopenia, unspecified: Secondary | ICD-10-CM | POA: Diagnosis not present

## 2015-12-28 DIAGNOSIS — I4891 Unspecified atrial fibrillation: Secondary | ICD-10-CM | POA: Diagnosis not present

## 2016-01-01 DIAGNOSIS — Z7901 Long term (current) use of anticoagulants: Secondary | ICD-10-CM | POA: Diagnosis not present

## 2016-01-01 DIAGNOSIS — I4891 Unspecified atrial fibrillation: Secondary | ICD-10-CM | POA: Diagnosis not present

## 2016-01-04 DIAGNOSIS — I8290 Acute embolism and thrombosis of unspecified vein: Secondary | ICD-10-CM | POA: Diagnosis not present

## 2016-01-04 DIAGNOSIS — I4891 Unspecified atrial fibrillation: Secondary | ICD-10-CM | POA: Diagnosis not present

## 2016-01-04 DIAGNOSIS — R451 Restlessness and agitation: Secondary | ICD-10-CM | POA: Diagnosis not present

## 2016-01-04 DIAGNOSIS — Z7901 Long term (current) use of anticoagulants: Secondary | ICD-10-CM | POA: Diagnosis not present

## 2016-01-05 DIAGNOSIS — N390 Urinary tract infection, site not specified: Secondary | ICD-10-CM | POA: Diagnosis not present

## 2016-01-05 DIAGNOSIS — Z79899 Other long term (current) drug therapy: Secondary | ICD-10-CM | POA: Diagnosis not present

## 2016-01-05 DIAGNOSIS — R319 Hematuria, unspecified: Secondary | ICD-10-CM | POA: Diagnosis not present

## 2016-01-08 DIAGNOSIS — I8290 Acute embolism and thrombosis of unspecified vein: Secondary | ICD-10-CM | POA: Diagnosis not present

## 2016-01-08 DIAGNOSIS — R451 Restlessness and agitation: Secondary | ICD-10-CM | POA: Diagnosis not present

## 2016-01-08 DIAGNOSIS — N390 Urinary tract infection, site not specified: Secondary | ICD-10-CM | POA: Diagnosis not present

## 2016-01-08 DIAGNOSIS — I4891 Unspecified atrial fibrillation: Secondary | ICD-10-CM | POA: Diagnosis not present

## 2016-01-08 DIAGNOSIS — I82421 Acute embolism and thrombosis of right iliac vein: Secondary | ICD-10-CM | POA: Diagnosis not present

## 2016-01-11 DIAGNOSIS — I4891 Unspecified atrial fibrillation: Secondary | ICD-10-CM | POA: Diagnosis not present

## 2016-01-11 DIAGNOSIS — N390 Urinary tract infection, site not specified: Secondary | ICD-10-CM | POA: Diagnosis not present

## 2016-01-11 DIAGNOSIS — I82421 Acute embolism and thrombosis of right iliac vein: Secondary | ICD-10-CM | POA: Diagnosis not present

## 2016-01-11 DIAGNOSIS — R451 Restlessness and agitation: Secondary | ICD-10-CM | POA: Diagnosis not present

## 2016-01-11 DIAGNOSIS — I8290 Acute embolism and thrombosis of unspecified vein: Secondary | ICD-10-CM | POA: Diagnosis not present

## 2016-01-15 DIAGNOSIS — I8290 Acute embolism and thrombosis of unspecified vein: Secondary | ICD-10-CM | POA: Diagnosis not present

## 2016-01-15 DIAGNOSIS — R451 Restlessness and agitation: Secondary | ICD-10-CM | POA: Diagnosis not present

## 2016-01-15 DIAGNOSIS — I4891 Unspecified atrial fibrillation: Secondary | ICD-10-CM | POA: Diagnosis not present

## 2016-01-15 DIAGNOSIS — N390 Urinary tract infection, site not specified: Secondary | ICD-10-CM | POA: Diagnosis not present

## 2016-01-15 DIAGNOSIS — Z7901 Long term (current) use of anticoagulants: Secondary | ICD-10-CM | POA: Diagnosis not present

## 2016-01-16 DIAGNOSIS — I10 Essential (primary) hypertension: Secondary | ICD-10-CM | POA: Diagnosis not present

## 2016-01-18 DIAGNOSIS — I4891 Unspecified atrial fibrillation: Secondary | ICD-10-CM | POA: Diagnosis not present

## 2016-01-18 DIAGNOSIS — R7989 Other specified abnormal findings of blood chemistry: Secondary | ICD-10-CM | POA: Diagnosis not present

## 2016-01-18 DIAGNOSIS — I82421 Acute embolism and thrombosis of right iliac vein: Secondary | ICD-10-CM | POA: Diagnosis not present

## 2016-01-19 DIAGNOSIS — R0602 Shortness of breath: Secondary | ICD-10-CM | POA: Diagnosis not present

## 2016-01-19 DIAGNOSIS — R7989 Other specified abnormal findings of blood chemistry: Secondary | ICD-10-CM | POA: Diagnosis not present

## 2016-01-19 DIAGNOSIS — D649 Anemia, unspecified: Secondary | ICD-10-CM | POA: Diagnosis not present

## 2016-01-22 DIAGNOSIS — I4891 Unspecified atrial fibrillation: Secondary | ICD-10-CM | POA: Diagnosis not present

## 2016-01-22 DIAGNOSIS — Z7901 Long term (current) use of anticoagulants: Secondary | ICD-10-CM | POA: Diagnosis not present

## 2016-01-22 DIAGNOSIS — R451 Restlessness and agitation: Secondary | ICD-10-CM | POA: Diagnosis not present

## 2016-01-22 DIAGNOSIS — I10 Essential (primary) hypertension: Secondary | ICD-10-CM | POA: Diagnosis not present

## 2016-01-22 DIAGNOSIS — N390 Urinary tract infection, site not specified: Secondary | ICD-10-CM | POA: Diagnosis not present

## 2016-01-22 DIAGNOSIS — I8290 Acute embolism and thrombosis of unspecified vein: Secondary | ICD-10-CM | POA: Diagnosis not present

## 2016-01-29 DIAGNOSIS — I4891 Unspecified atrial fibrillation: Secondary | ICD-10-CM | POA: Diagnosis not present

## 2016-01-29 DIAGNOSIS — L219 Seborrheic dermatitis, unspecified: Secondary | ICD-10-CM | POA: Diagnosis not present

## 2016-01-29 DIAGNOSIS — I8290 Acute embolism and thrombosis of unspecified vein: Secondary | ICD-10-CM | POA: Diagnosis not present

## 2016-01-29 DIAGNOSIS — R451 Restlessness and agitation: Secondary | ICD-10-CM | POA: Diagnosis not present

## 2016-01-29 DIAGNOSIS — Z7901 Long term (current) use of anticoagulants: Secondary | ICD-10-CM | POA: Diagnosis not present

## 2016-01-30 DIAGNOSIS — M79675 Pain in left toe(s): Secondary | ICD-10-CM | POA: Diagnosis not present

## 2016-01-30 DIAGNOSIS — B351 Tinea unguium: Secondary | ICD-10-CM | POA: Diagnosis not present

## 2016-01-30 DIAGNOSIS — M79674 Pain in right toe(s): Secondary | ICD-10-CM | POA: Diagnosis not present

## 2016-02-05 DIAGNOSIS — Z7901 Long term (current) use of anticoagulants: Secondary | ICD-10-CM | POA: Diagnosis not present

## 2016-02-05 DIAGNOSIS — I4891 Unspecified atrial fibrillation: Secondary | ICD-10-CM | POA: Diagnosis not present

## 2016-02-08 DIAGNOSIS — I8290 Acute embolism and thrombosis of unspecified vein: Secondary | ICD-10-CM | POA: Diagnosis not present

## 2016-02-08 DIAGNOSIS — L89603 Pressure ulcer of unspecified heel, stage 3: Secondary | ICD-10-CM | POA: Diagnosis not present

## 2016-02-08 DIAGNOSIS — R627 Adult failure to thrive: Secondary | ICD-10-CM | POA: Diagnosis not present

## 2016-02-08 DIAGNOSIS — M6281 Muscle weakness (generalized): Secondary | ICD-10-CM | POA: Diagnosis not present

## 2016-02-08 DIAGNOSIS — I4891 Unspecified atrial fibrillation: Secondary | ICD-10-CM | POA: Diagnosis not present

## 2016-02-08 DIAGNOSIS — D696 Thrombocytopenia, unspecified: Secondary | ICD-10-CM | POA: Diagnosis not present

## 2016-02-19 DIAGNOSIS — I4891 Unspecified atrial fibrillation: Secondary | ICD-10-CM | POA: Diagnosis not present

## 2016-02-19 DIAGNOSIS — Z7901 Long term (current) use of anticoagulants: Secondary | ICD-10-CM | POA: Diagnosis not present

## 2016-02-19 DIAGNOSIS — H2511 Age-related nuclear cataract, right eye: Secondary | ICD-10-CM | POA: Diagnosis not present

## 2016-02-19 DIAGNOSIS — H353131 Nonexudative age-related macular degeneration, bilateral, early dry stage: Secondary | ICD-10-CM | POA: Diagnosis not present

## 2016-02-19 DIAGNOSIS — I10 Essential (primary) hypertension: Secondary | ICD-10-CM | POA: Diagnosis not present

## 2016-02-19 DIAGNOSIS — Z961 Presence of intraocular lens: Secondary | ICD-10-CM | POA: Diagnosis not present

## 2016-02-19 DIAGNOSIS — R0602 Shortness of breath: Secondary | ICD-10-CM | POA: Diagnosis not present

## 2016-02-19 DIAGNOSIS — D72829 Elevated white blood cell count, unspecified: Secondary | ICD-10-CM | POA: Diagnosis not present

## 2016-02-23 ENCOUNTER — Emergency Department (HOSPITAL_COMMUNITY): Payer: Medicare Other

## 2016-02-23 ENCOUNTER — Encounter (HOSPITAL_COMMUNITY): Payer: Self-pay

## 2016-02-23 ENCOUNTER — Inpatient Hospital Stay (HOSPITAL_COMMUNITY)
Admission: EM | Admit: 2016-02-23 | Discharge: 2016-02-26 | DRG: 689 | Disposition: A | Discharge status: Skilled Nursing Facility | Payer: Medicare Other | Attending: Internal Medicine | Admitting: Internal Medicine

## 2016-02-23 DIAGNOSIS — G309 Alzheimer's disease, unspecified: Secondary | ICD-10-CM | POA: Diagnosis not present

## 2016-02-23 DIAGNOSIS — Z8679 Personal history of other diseases of the circulatory system: Secondary | ICD-10-CM | POA: Diagnosis not present

## 2016-02-23 DIAGNOSIS — N183 Chronic kidney disease, stage 3 (moderate): Secondary | ICD-10-CM | POA: Diagnosis present

## 2016-02-23 DIAGNOSIS — Z888 Allergy status to other drugs, medicaments and biological substances status: Secondary | ICD-10-CM | POA: Diagnosis not present

## 2016-02-23 DIAGNOSIS — N390 Urinary tract infection, site not specified: Principal | ICD-10-CM | POA: Diagnosis present

## 2016-02-23 DIAGNOSIS — R1310 Dysphagia, unspecified: Secondary | ICD-10-CM | POA: Diagnosis not present

## 2016-02-23 DIAGNOSIS — E785 Hyperlipidemia, unspecified: Secondary | ICD-10-CM | POA: Diagnosis not present

## 2016-02-23 DIAGNOSIS — K219 Gastro-esophageal reflux disease without esophagitis: Secondary | ICD-10-CM | POA: Diagnosis present

## 2016-02-23 DIAGNOSIS — Z86718 Personal history of other venous thrombosis and embolism: Secondary | ICD-10-CM

## 2016-02-23 DIAGNOSIS — I6789 Other cerebrovascular disease: Secondary | ICD-10-CM | POA: Diagnosis not present

## 2016-02-23 DIAGNOSIS — Z881 Allergy status to other antibiotic agents status: Secondary | ICD-10-CM | POA: Diagnosis not present

## 2016-02-23 DIAGNOSIS — L8990 Pressure ulcer of unspecified site, unspecified stage: Secondary | ICD-10-CM | POA: Insufficient documentation

## 2016-02-23 DIAGNOSIS — Z23 Encounter for immunization: Secondary | ICD-10-CM

## 2016-02-23 DIAGNOSIS — R29898 Other symptoms and signs involving the musculoskeletal system: Secondary | ICD-10-CM

## 2016-02-23 DIAGNOSIS — Z825 Family history of asthma and other chronic lower respiratory diseases: Secondary | ICD-10-CM | POA: Diagnosis not present

## 2016-02-23 DIAGNOSIS — Z880 Allergy status to penicillin: Secondary | ICD-10-CM | POA: Diagnosis not present

## 2016-02-23 DIAGNOSIS — R4182 Altered mental status, unspecified: Secondary | ICD-10-CM | POA: Diagnosis not present

## 2016-02-23 DIAGNOSIS — R918 Other nonspecific abnormal finding of lung field: Secondary | ICD-10-CM | POA: Diagnosis not present

## 2016-02-23 DIAGNOSIS — Z833 Family history of diabetes mellitus: Secondary | ICD-10-CM | POA: Diagnosis not present

## 2016-02-23 DIAGNOSIS — F319 Bipolar disorder, unspecified: Secondary | ICD-10-CM | POA: Diagnosis present

## 2016-02-23 DIAGNOSIS — G9341 Metabolic encephalopathy: Secondary | ICD-10-CM | POA: Diagnosis present

## 2016-02-23 DIAGNOSIS — N179 Acute kidney failure, unspecified: Secondary | ICD-10-CM | POA: Diagnosis not present

## 2016-02-23 DIAGNOSIS — F0280 Dementia in other diseases classified elsewhere without behavioral disturbance: Secondary | ICD-10-CM | POA: Diagnosis present

## 2016-02-23 DIAGNOSIS — I129 Hypertensive chronic kidney disease with stage 1 through stage 4 chronic kidney disease, or unspecified chronic kidney disease: Secondary | ICD-10-CM | POA: Diagnosis not present

## 2016-02-23 DIAGNOSIS — Z79899 Other long term (current) drug therapy: Secondary | ICD-10-CM

## 2016-02-23 DIAGNOSIS — E860 Dehydration: Secondary | ICD-10-CM | POA: Diagnosis present

## 2016-02-23 DIAGNOSIS — F0390 Unspecified dementia without behavioral disturbance: Secondary | ICD-10-CM | POA: Diagnosis present

## 2016-02-23 DIAGNOSIS — Z7982 Long term (current) use of aspirin: Secondary | ICD-10-CM | POA: Diagnosis not present

## 2016-02-23 DIAGNOSIS — R404 Transient alteration of awareness: Secondary | ICD-10-CM | POA: Diagnosis not present

## 2016-02-23 DIAGNOSIS — I10 Essential (primary) hypertension: Secondary | ICD-10-CM | POA: Diagnosis not present

## 2016-02-23 DIAGNOSIS — Z66 Do not resuscitate: Secondary | ICD-10-CM | POA: Diagnosis present

## 2016-02-23 DIAGNOSIS — F419 Anxiety disorder, unspecified: Secondary | ICD-10-CM | POA: Diagnosis present

## 2016-02-23 DIAGNOSIS — B952 Enterococcus as the cause of diseases classified elsewhere: Secondary | ICD-10-CM | POA: Diagnosis not present

## 2016-02-23 LAB — COMPREHENSIVE METABOLIC PANEL
ALT: 12 U/L — ABNORMAL LOW (ref 14–54)
ANION GAP: 5 (ref 5–15)
AST: 18 U/L (ref 15–41)
Albumin: 2.8 g/dL — ABNORMAL LOW (ref 3.5–5.0)
Alkaline Phosphatase: 81 U/L (ref 38–126)
BILIRUBIN TOTAL: 0.5 mg/dL (ref 0.3–1.2)
BUN: 42 mg/dL — AB (ref 6–20)
CHLORIDE: 103 mmol/L (ref 101–111)
CO2: 28 mmol/L (ref 22–32)
Calcium: 9.2 mg/dL (ref 8.9–10.3)
Creatinine, Ser: 1.68 mg/dL — ABNORMAL HIGH (ref 0.44–1.00)
GFR calc Af Amer: 33 mL/min — ABNORMAL LOW (ref >60)
GFR, EST NON AFRICAN AMERICAN: 28 mL/min — AB (ref >60)
Glucose, Bld: 89 mg/dL (ref 65–99)
POTASSIUM: 4.5 mmol/L (ref 3.5–5.1)
Sodium: 136 mmol/L (ref 135–145)
TOTAL PROTEIN: 6.2 g/dL — AB (ref 6.5–8.1)

## 2016-02-23 LAB — URINALYSIS, ROUTINE W REFLEX MICROSCOPIC
BILIRUBIN URINE: NEGATIVE
GLUCOSE, UA: NEGATIVE mg/dL
KETONES UR: NEGATIVE mg/dL
NITRITE: NEGATIVE
PH: 6 (ref 5.0–8.0)
PROTEIN: NEGATIVE mg/dL
Specific Gravity, Urine: 1.02 (ref 1.005–1.030)

## 2016-02-23 LAB — URINE MICROSCOPIC-ADD ON: SQUAMOUS EPITHELIAL / LPF: NONE SEEN

## 2016-02-23 LAB — CBC WITH DIFFERENTIAL/PLATELET
BASOS ABS: 0 10*3/uL (ref 0.0–0.1)
BASOS PCT: 0 %
EOS ABS: 0.4 10*3/uL (ref 0.0–0.7)
Eosinophils Relative: 6 %
HEMATOCRIT: 34.5 % — AB (ref 36.0–46.0)
HEMOGLOBIN: 10.5 g/dL — AB (ref 12.0–15.0)
Lymphocytes Relative: 23 %
Lymphs Abs: 1.4 10*3/uL (ref 0.7–4.0)
MCH: 30.4 pg (ref 26.0–34.0)
MCHC: 30.4 g/dL (ref 30.0–36.0)
MCV: 100 fL (ref 78.0–100.0)
MONOS PCT: 10 %
Monocytes Absolute: 0.6 10*3/uL (ref 0.1–1.0)
NEUTROS ABS: 3.8 10*3/uL (ref 1.7–7.7)
NEUTROS PCT: 61 %
Platelets: 218 10*3/uL (ref 150–400)
RBC: 3.45 MIL/uL — AB (ref 3.87–5.11)
RDW: 16.3 % — ABNORMAL HIGH (ref 11.5–15.5)
WBC: 6.1 10*3/uL (ref 4.0–10.5)

## 2016-02-23 LAB — TROPONIN I

## 2016-02-23 LAB — LACTIC ACID, PLASMA: LACTIC ACID, VENOUS: 1.1 mmol/L (ref 0.5–1.9)

## 2016-02-23 LAB — PROTIME-INR
INR: 0.94
PROTHROMBIN TIME: 12.6 s (ref 11.4–15.2)

## 2016-02-23 MED ORDER — DEXTROSE 5 % IV SOLN
1.0000 g | Freq: Once | INTRAVENOUS | Status: AC
  Administered 2016-02-24: 1 g via INTRAVENOUS
  Filled 2016-02-23: qty 1

## 2016-02-23 MED ORDER — DEXTROSE 5 % IV SOLN: 1.0000 g | Freq: Once | INTRAVENOUS | Status: DC

## 2016-02-23 MED ORDER — AZTREONAM 1 G IJ SOLR
INTRAMUSCULAR | Status: AC
  Filled 2016-02-23: qty 1

## 2016-02-23 NOTE — ED Triage Notes (Signed)
Pt is a resident of [facility] where staff reportedly last noticed pt normal at approx 3 pm when she was watching a movie with some other residents.  Nurse reports she went to check on the patient approx [phone] and noticed some decreased loc at that time.  Per nurse, pt has become very stiff and rigid, but continues to follow commands per ems.

## 2016-02-23 NOTE — ED Provider Notes (Signed)
[facility] Provider Note   CSN: [id number] Arrival date & time: 02/23/16  2152     History   Chief Complaint Chief Complaint  Patient presents with  . Altered Mental Status    HPI Amy Clay is a 75 y.o. female.  The history is provided by the EMS personnel and the nursing home. The history is limited by the condition of the patient (Hx dementia, AMS).  Altered Mental Status      Pt was seen at 2215. Per EMS and NH report: Pt LKW approximately 1500 today, watching a movie with other NH residents. NH RN states pt had decreased LOC approximately 1645 today when she went to check on her. Pt was given clonopin this afternoon around this time, per NH staff. EMS states pt has been lethargic, but will follow commands.    Past Medical History:  Diagnosis Date  . Altered mental status   . Anxiety   . Arthritis   . Bipolar 1 disorder (HCC)   . Chronic kidney disease   . Dementia   . Depression   . Dysphagia   . GERD (gastroesophageal reflux disease)   . Hemorrhoids   . High blood pressure   . Hyperlipidemia   . Hypoglycemia   . Localized edema   . Osteoarthritis   . Thrombocytopenia (HCC)   . Weakness     Patient Active Problem List   Diagnosis Date Noted  . Pressure ulcer 07/16/2015  . UTI (lower urinary tract infection) 07/15/2015  . DVT (deep venous thrombosis), unspecified laterality 03/24/2015  . Heel ulcer (HCC) 03/24/2015  . Hematoma of abdominal wall 08/20/2014  . Hypoglycemia 08/20/2014  . Morbid obesity (HCC) 08/20/2014  . Thrombocytopenia (HCC) 08/20/2014  . Altered mental status 08/19/2014  . Altered mental state 08/19/2014  . CKD (chronic kidney disease) stage 3, GFR 30-59 ml/min 08/19/2014  . Bilateral lower extremity edema 08/19/2014  . Hypothermia 08/19/2014  . Dementia 04/20/2014  . Benign essential HTN 04/20/2014  . Physical deconditioning   . Acute on chronic renal failure (HCC) 02/10/2013  . Anemia 02/10/2013  . Fall 02/09/2013    . Ingrown right big toenail 12/30/2012  . 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 Syncope 02/13/2012  . Constipation 10/01/2011  . Dysphagia 10/01/2011  . Abdominal pain 10/01/2011  . Umbilical hernia 10/01/2011  . Syncope and collapse 09/15/2011  . Major depression 09/15/2011  . Arthritis 09/15/2011  . Rotator cuff syndrome of right shoulder 11/13/2010  . CELLULITIS/ABSCESS, LEG 03/03/2007  . History of cardiovascular disorder 02/13/2007    Past Surgical History:  Procedure Laterality Date  . ABDOMINAL HYSTERECTOMY    . ANKLE SURGERY  04/28/97   right ankle fracture  . BACK SURGERY  03/16/87  . bladder tacked  04/04/95  . BREAST BIOPSY  05/16/95   right side  . CHOLECYSTECTOMY  08/15/93  . COLONOSCOPY  10/26/2002   Dr. Karilyn Cota- small external hemorrhoids otherwise normal   . MALONEY DILATION  10/31/2011   Procedure: Elease Hashimoto DILATION;  Surgeon: Corbin Ade, MD;  Location: [facility];  Service: Endoscopy;  Laterality: N/A;  . PARTIAL HYSTERECTOMY  04/04/95  . SAVORY DILATION  10/31/2011   Procedure: SAVORY DILATION;  Surgeon: Corbin Ade, MD;  Location: [facility];  Service: Endoscopy;  Laterality: N/A;  . WRIST SURGERY  02/24/09   right wrist fracture    OB History    Gravida Para Term Preterm AB Living  0   SAB TAB Ectopic Multiple Live Births                   Home Medications    Prior to Admission medications   Medication Sig Start Date End Date Taking? Authorizing Provider  acetaminophen (TYLENOL) 325 MG tablet Take 650 mg by mouth every 6 (six) hours as needed for mild pain or moderate pain.   Yes Historical Provider, MD  Amino Acids-Protein Hydrolys (FEEDING SUPPLEMENT, PRO-STAT SUGAR FREE 64,) LIQD Take 30 mLs by mouth 2 (two) times daily.   Yes Historical Provider, MD  aspirin EC 81 MG tablet Take 81 mg by mouth every morning.    Yes Historical Provider, MD  benztropine (COGENTIN) 0.5 MG tablet Take 1 tablet (0.5 mg total) by mouth 2  (two) times daily. 12/22/15  Yes Myrlene Broker, MD  bisacodyl (DULCOLAX) 5 MG EC tablet Take 10 mg by mouth every 8 (eight) hours as needed for moderate constipation.   Yes Historical Provider, MD  buPROPion (WELLBUTRIN XL) 300 MG 24 hr tablet Take 1 tablet (300 mg total) by mouth every morning. 12/22/15 12/21/16 Yes Myrlene Broker, MD  Calcium Carb-Vit D-C-E-Mineral (OS-CAL ULTRA) 600 MG TABS Take by mouth 2 (two) times daily.   Yes Historical Provider, MD  Cholecalciferol (VITAMIN D) 2000 UNITS CAPS Take 1 capsule by mouth every morning.    Yes Historical Provider, MD  clonazePAM (KLONOPIN) 0.5 MG tablet Take daily at 5 pm Patient taking differently: Take 0.5 mg by mouth daily. Take daily at 5 pm. *May take every 12 hours as needed for anxiety/agitation 08/22/15  Yes Myrlene Broker, MD  donepezil (ARICEPT) 5 MG tablet Take 1 tablet (5 mg total) by mouth at bedtime. 12/22/15 12/21/16 Yes Myrlene Broker, MD  furosemide (LASIX) 20 MG tablet Take 20 mg by mouth every Monday, Wednesday, and Friday.    Yes Historical Provider, MD  guaifenesin (HUMIBID E) 400 MG TABS tablet Take 400 mg by mouth 2 (two) times daily.   Yes Historical Provider, MD  ipratropium-albuterol (DUONEB) 0.5-2.5 (3) MG/3ML SOLN Take 3 mLs by nebulization every 6 (six) hours as needed (for cough).   Yes Historical Provider, MD  ketoconazole (NIZORAL) 2 % shampoo Apply 1 application topically 2 (two) times a week. Every Sunday and Wednesday for dry scalp   Yes Historical Provider, MD  lamoTRIgine (LAMICTAL) 100 MG tablet Take 1 tablet (100 mg total) by mouth 2 (two) times daily. 12/22/15  Yes Myrlene Broker, MD  loratadine (CLARITIN) 10 MG tablet Take 10 mg by mouth daily.   Yes Historical Provider, MD  Multiple Vitamin (MULTIVITAMIN WITH MINERALS) TABS tablet Take 1 tablet by mouth daily.   Yes Historical Provider, MD  omeprazole (PRILOSEC) 20 MG capsule Take 20 mg by mouth at bedtime.    Yes Historical Provider, MD  ondansetron (ZOFRAN) 4  MG tablet Take 4 mg by mouth every 8 (eight) hours as needed for nausea or vomiting.   Yes Historical Provider, MD  oxybutynin (DITROPAN-XL) 10 MG 24 hr tablet Take 10 mg by mouth daily.    Yes Historical Provider, MD  PARoxetine (PAXIL) 40 MG tablet Take 1 tablet (40 mg total) by mouth at bedtime. 12/22/15 12/21/16 Yes Myrlene Broker, MD  polyethylene glycol [facility] E[facility]lene Hal) packet Take 17 g by mouth daily.    Yes Historical Provider, MD  risperiDONE (RISPERDAL) 0.25 MG tablet Take 1 tablet (0.25 mg total) by mouth 3 (three) times  daily. 12/22/15  Yes Myrlene Brokereborah R Ross, MD  risperiDONE (RISPERDAL) 1 MG tablet Take 1 tablet (1 mg total) by mouth at bedtime. 12/22/15  Yes Myrlene Brokereborah R Ross, MD  senna (SENOKOT) 8.6 MG TABS tablet Take 1 tablet by mouth 2 (two) times daily.   Yes Historical Provider, MD  terazosin (HYTRIN) 2 MG capsule Take 2 mg by mouth at bedtime.    Yes Historical Provider, MD    Family History Family History  Problem Relation Age of Onset  . Arthritis    . Asthma    . Diabetes    . Anxiety disorder Paternal Aunt   . Anxiety disorder Paternal Uncle   . Bipolar disorder Cousin   . Colon cancer Neg Hx     Social History Social History  Substance Use Topics  . Smoking status: Never Smoker  . Smokeless tobacco: Never Used  . Alcohol use No     Allergies   Benzodiazepines; Ciprofloxacin; Cephalexin; and Penicillins   Review of Systems Review of Systems  Unable to perform ROS: Mental status change     Physical Exam Updated Vital Signs BP 110/63   Pulse 63   Temp 97.1 F (36.2 C) (Rectal)   Resp 18   SpO2 95%   Physical Exam 2220: Physical examination:  Nursing notes reviewed; Vital signs and O2 SAT reviewed;  Constitutional: Well developed, Well nourished, In no acute distress; Head:  Normocephalic, atraumatic; Eyes: EOMI, PERRL, No scleral icterus; ENMT: Mouth and pharynx normal, Mucous membranes dry; Neck: Supple, Full range of motion, No lymphadenopathy;  Cardiovascular: Regular rate and rhythm, No gallop; Respiratory: Breath sounds clear & equal bilaterally, No wheezes. Normal respiratory effort/excursion; Chest: Nontender, Movement normal; Abdomen: Soft, Nontender, Nondistended, Normal bowel sounds; Genitourinary: No CVA tenderness; Extremities: Pulses normal, No tenderness, No edema, No calf edema or asymmetry.; Neuro: Lethargic, opens her eyes to name then closes them, follows commands. No facial droop. Moves extremities spontaneously on stretcher..; Skin: Color normal, Warm, Dry.    ED Treatments / Results  Labs (all labs ordered are listed, but only abnormal results are displayed)   EKG  EKG Interpretation  Date/Time:  Friday February 23 2016 22:02:37 EDT Ventricular Rate:  61 PR Interval:    QRS Duration: 86 QT Interval:  429 QTC Calculation: 433 R Axis:     Text Interpretation:  Sinus rhythm Prolonged PR interval Low voltage, precordial leads Abnormal R-wave progression, early transition LVH by voltage When compared with ECG of 07/15/2015 Normal sinus rhythm has replaced Accelerated junctional rhythm Confirmed by [facility]  MD, Nicholos JohnsKATHLEEN [phone]([id number]) on 02/23/2016 10:52:04 PM       Radiology   Procedures Procedures (including critical care time)  Medications Ordered in ED Medications - No data to display   Initial Impression / Assessment and Plan / ED Course  I have reviewed the triage vital signs and the nursing notes.  Pertinent labs & imaging results that were available during my care of the patient were reviewed by me and considered in my medical decision making (see chart for details).   MDM Reviewed: previous chart, nursing note and vitals Reviewed previous: labs and ECG Interpretation: labs, ECG, x-ray and CT scan   Results for orders placed or performed during the hospital encounter of 02/23/16  Urinalysis, Routine w reflex microscopic  Result Value Ref Range   Color, Urine YELLOW YELLOW   APPearance CLOUDY (A)  CLEAR   Specific Gravity, Urine 1.020 1.005 - 1.030   pH 6.0 5.0 - 8.0  Glucose, UA NEGATIVE NEGATIVE mg/dL   Hgb urine dipstick SMALL (A) NEGATIVE   Bilirubin Urine NEGATIVE NEGATIVE   Ketones, ur NEGATIVE NEGATIVE mg/dL   Protein, ur NEGATIVE NEGATIVE mg/dL   Nitrite NEGATIVE NEGATIVE   Leukocytes, UA LARGE (A) NEGATIVE  CBC with Differential  Result Value Ref Range   WBC 6.1 4.0 - 10.5 K/uL   RBC 3.45 (L) 3.87 - 5.11 MIL/uL   Hemoglobin 10.5 (L) 12.0 - 15.0 g/dL   HCT 78.2 (L) 95.6 - 21.3 %   MCV 100.0 78.0 - 100.0 fL   MCH 30.4 26.0 - 34.0 pg   MCHC 30.4 30.0 - 36.0 g/dL   RDW 08.6 (H) 57.8 - 46.9 %   Platelets 218 150 - 400 K/uL   Neutrophils Relative % 61 %   Neutro Abs 3.8 1.7 - 7.7 K/uL   Lymphocytes Relative 23 %   Lymphs Abs 1.4 0.7 - 4.0 K/uL   Monocytes Relative 10 %   Monocytes Absolute 0.6 0.1 - 1.0 K/uL   Eosinophils Relative 6 %   Eosinophils Absolute 0.4 0.0 - 0.7 K/uL   Basophils Relative 0 %   Basophils Absolute 0.0 0.0 - 0.1 K/uL  Protime-INR  Result Value Ref Range   Prothrombin Time 12.6 11.4 - 15.2 seconds   INR 0.94   Urine microscopic-add on  Result Value Ref Range   Squamous Epithelial / LPF NONE SEEN NONE SEEN   WBC, UA TOO NUMEROUS TO COUNT 0 - 5 WBC/hpf   RBC / HPF 6-30 0 - 5 RBC/hpf   Bacteria, UA MANY (A) NONE SEEN   Ct Head Wo Contrast Result Date: 02/23/2016 CLINICAL DATA:  Patient is stents and rigid, altered mental status EXAM: CT HEAD WITHOUT CONTRAST TECHNIQUE: Contiguous axial images were obtained from the base of the skull through the vertex without intravenous contrast. COMPARISON:  07/15/2015, 08/19/2014 FINDINGS: Brain: There is mild motion artifact. No large vessel territorial infarction. No hemorrhage. No mass effect. Stable degree of ventricular enlargement. Moderate atrophy. Moderate confluent white matter presumed small vessel disease. Prominent extra-axial CSF space along the left parietal convexity is unchanged allowing for  differences in head positioning. Vascular: Calcifications present within the carotid arteries at the skullbase. No hyperdense vessels. Skull: Mastoid air cells are clear.  There is no fracture. Sinuses/Orbits: Orbits similar compared to prior with left lens implant. Paranasal sinuses are grossly clear. Other: None IMPRESSION: 1. No definite CT evidence for acute intracranial abnormality allowing for motion artifact. 2. Atrophy. Moderate periventricular presumed small vessel white matter disease. Electronically Signed   By: Jasmine Pang M.D.   On: 02/23/2016 23:08   Dg Chest Port 1 View Result Date: 02/23/2016 CLINICAL DATA:  Altered mental status EXAM: PORTABLE CHEST 1 VIEW COMPARISON:  Chest radiograph 07/17/2015 FINDINGS: There is shallow lung inflation. No focal airspace consolidation. There is right basilar atelectasis, increased from the prior study. No pneumothorax or sizable pleural effusion. No pulmonary edema. Unchanged cardiomediastinal contours. IMPRESSION: Shallow lung inflation.  No focal consolidation or pulmonary edema. Electronically Signed   By: Deatra Robinson M.D.   On: 02/23/2016 22:50    0015:  +UTI, UC pending; IV aztreonam started due to multiple drug allergies. Dx and testing d/w pt's family.  Questions answered.  Verb understanding, agreeable to admit.  T/C to [facility] Dr. Conley Rolls, case discussed, including:  HPI, pertinent PM/SHx, VS/PE, dx testing, ED course and treatment:  Agreeable to admit, requests he will come to the ED for evaluation.  Final Clinical Impressions(s) / ED Diagnoses   Final diagnoses:  None    New Prescriptions New Prescriptions   No medications on file      Samuel Jester, DO 02/27/16 [id number]

## 2016-02-24 ENCOUNTER — Encounter (HOSPITAL_COMMUNITY): Payer: Self-pay | Admitting: *Deleted

## 2016-02-24 DIAGNOSIS — F0280 Dementia in other diseases classified elsewhere without behavioral disturbance: Secondary | ICD-10-CM

## 2016-02-24 DIAGNOSIS — E46 Unspecified protein-calorie malnutrition: Secondary | ICD-10-CM | POA: Diagnosis not present

## 2016-02-24 DIAGNOSIS — I10 Essential (primary) hypertension: Secondary | ICD-10-CM | POA: Diagnosis not present

## 2016-02-24 DIAGNOSIS — K219 Gastro-esophageal reflux disease without esophagitis: Secondary | ICD-10-CM | POA: Diagnosis not present

## 2016-02-24 DIAGNOSIS — Z66 Do not resuscitate: Secondary | ICD-10-CM | POA: Diagnosis present

## 2016-02-24 DIAGNOSIS — B952 Enterococcus as the cause of diseases classified elsewhere: Secondary | ICD-10-CM | POA: Diagnosis present

## 2016-02-24 DIAGNOSIS — R1310 Dysphagia, unspecified: Secondary | ICD-10-CM | POA: Diagnosis present

## 2016-02-24 DIAGNOSIS — N3289 Other specified disorders of bladder: Secondary | ICD-10-CM | POA: Diagnosis not present

## 2016-02-24 DIAGNOSIS — L89623 Pressure ulcer of left heel, stage 3: Secondary | ICD-10-CM | POA: Diagnosis not present

## 2016-02-24 DIAGNOSIS — Z743 Need for continuous supervision: Secondary | ICD-10-CM | POA: Diagnosis not present

## 2016-02-24 DIAGNOSIS — Z79899 Other long term (current) drug therapy: Secondary | ICD-10-CM | POA: Diagnosis not present

## 2016-02-24 DIAGNOSIS — N179 Acute kidney failure, unspecified: Secondary | ICD-10-CM | POA: Diagnosis present

## 2016-02-24 DIAGNOSIS — R627 Adult failure to thrive: Secondary | ICD-10-CM | POA: Diagnosis not present

## 2016-02-24 DIAGNOSIS — R279 Unspecified lack of coordination: Secondary | ICD-10-CM | POA: Diagnosis not present

## 2016-02-24 DIAGNOSIS — L8990 Pressure ulcer of unspecified site, unspecified stage: Secondary | ICD-10-CM | POA: Diagnosis present

## 2016-02-24 DIAGNOSIS — Z86718 Personal history of other venous thrombosis and embolism: Secondary | ICD-10-CM | POA: Diagnosis not present

## 2016-02-24 DIAGNOSIS — N183 Chronic kidney disease, stage 3 (moderate): Secondary | ICD-10-CM | POA: Diagnosis not present

## 2016-02-24 DIAGNOSIS — Z7982 Long term (current) use of aspirin: Secondary | ICD-10-CM | POA: Diagnosis not present

## 2016-02-24 DIAGNOSIS — N390 Urinary tract infection, site not specified: Secondary | ICD-10-CM | POA: Diagnosis not present

## 2016-02-24 DIAGNOSIS — Z833 Family history of diabetes mellitus: Secondary | ICD-10-CM | POA: Diagnosis not present

## 2016-02-24 DIAGNOSIS — R4182 Altered mental status, unspecified: Secondary | ICD-10-CM | POA: Diagnosis present

## 2016-02-24 DIAGNOSIS — I82409 Acute embolism and thrombosis of unspecified deep veins of unspecified lower extremity: Secondary | ICD-10-CM | POA: Diagnosis not present

## 2016-02-24 DIAGNOSIS — Z23 Encounter for immunization: Secondary | ICD-10-CM | POA: Diagnosis not present

## 2016-02-24 DIAGNOSIS — E559 Vitamin D deficiency, unspecified: Secondary | ICD-10-CM | POA: Diagnosis not present

## 2016-02-24 DIAGNOSIS — D696 Thrombocytopenia, unspecified: Secondary | ICD-10-CM | POA: Diagnosis not present

## 2016-02-24 DIAGNOSIS — I129 Hypertensive chronic kidney disease with stage 1 through stage 4 chronic kidney disease, or unspecified chronic kidney disease: Secondary | ICD-10-CM | POA: Diagnosis present

## 2016-02-24 DIAGNOSIS — F419 Anxiety disorder, unspecified: Secondary | ICD-10-CM | POA: Diagnosis present

## 2016-02-24 DIAGNOSIS — D649 Anemia, unspecified: Secondary | ICD-10-CM | POA: Diagnosis not present

## 2016-02-24 DIAGNOSIS — Z881 Allergy status to other antibiotic agents status: Secondary | ICD-10-CM | POA: Diagnosis not present

## 2016-02-24 DIAGNOSIS — Z888 Allergy status to other drugs, medicaments and biological substances status: Secondary | ICD-10-CM | POA: Diagnosis not present

## 2016-02-24 DIAGNOSIS — E860 Dehydration: Secondary | ICD-10-CM | POA: Diagnosis present

## 2016-02-24 DIAGNOSIS — G309 Alzheimer's disease, unspecified: Secondary | ICD-10-CM | POA: Diagnosis present

## 2016-02-24 DIAGNOSIS — Z7901 Long term (current) use of anticoagulants: Secondary | ICD-10-CM | POA: Diagnosis not present

## 2016-02-24 DIAGNOSIS — J9811 Atelectasis: Secondary | ICD-10-CM | POA: Diagnosis not present

## 2016-02-24 DIAGNOSIS — Z880 Allergy status to penicillin: Secondary | ICD-10-CM | POA: Diagnosis not present

## 2016-02-24 DIAGNOSIS — Z8679 Personal history of other diseases of the circulatory system: Secondary | ICD-10-CM | POA: Diagnosis not present

## 2016-02-24 DIAGNOSIS — Z825 Family history of asthma and other chronic lower respiratory diseases: Secondary | ICD-10-CM | POA: Diagnosis not present

## 2016-02-24 DIAGNOSIS — F319 Bipolar disorder, unspecified: Secondary | ICD-10-CM | POA: Diagnosis present

## 2016-02-24 DIAGNOSIS — G9341 Metabolic encephalopathy: Secondary | ICD-10-CM | POA: Diagnosis present

## 2016-02-24 DIAGNOSIS — E785 Hyperlipidemia, unspecified: Secondary | ICD-10-CM | POA: Diagnosis not present

## 2016-02-24 DIAGNOSIS — M1990 Unspecified osteoarthritis, unspecified site: Secondary | ICD-10-CM | POA: Diagnosis not present

## 2016-02-24 DIAGNOSIS — M6281 Muscle weakness (generalized): Secondary | ICD-10-CM | POA: Diagnosis not present

## 2016-02-24 LAB — GLUCOSE, CAPILLARY
GLUCOSE-CAPILLARY: 90 mg/dL (ref 65–99)
Glucose-Capillary: 77 mg/dL (ref 65–99)
Glucose-Capillary: 98 mg/dL (ref 65–99)
Glucose-Capillary: 98 mg/dL (ref 65–99)
Glucose-Capillary: 125 mg/dL — ABNORMAL HIGH (ref 65–99)

## 2016-02-24 LAB — COMPREHENSIVE METABOLIC PANEL
ALT: 11 U/L — AB (ref 14–54)
AST: 14 U/L — ABNORMAL LOW (ref 15–41)
Albumin: 2.7 g/dL — ABNORMAL LOW (ref 3.5–5.0)
Alkaline Phosphatase: 85 U/L (ref 38–126)
Anion gap: 4 — ABNORMAL LOW (ref 5–15)
BUN: 42 mg/dL — ABNORMAL HIGH (ref 6–20)
CHLORIDE: 105 mmol/L (ref 101–111)
CO2: 31 mmol/L (ref 22–32)
CREATININE: 1.48 mg/dL — AB (ref 0.44–1.00)
Calcium: 9.3 mg/dL (ref 8.9–10.3)
GFR, EST AFRICAN AMERICAN: 38 mL/min — AB (ref >60)
GFR, EST NON AFRICAN AMERICAN: 33 mL/min — AB (ref >60)
Glucose, Bld: 98 mg/dL (ref 65–99)
Potassium: 4.3 mmol/L (ref 3.5–5.1)
Sodium: 140 mmol/L (ref 135–145)
Total Bilirubin: 0.4 mg/dL (ref 0.3–1.2)
Total Protein: 5.8 g/dL — ABNORMAL LOW (ref 6.5–8.1)

## 2016-02-24 LAB — CBC
HCT: 35.6 % — ABNORMAL LOW (ref 36.0–46.0)
HCT: 35 % — ABNORMAL LOW (ref 36.0–46.0)
Hemoglobin: 11 g/dL — ABNORMAL LOW (ref 12.0–15.0)
Hemoglobin: 10.3 g/dL — ABNORMAL LOW (ref 12.0–15.0)
MCH: 31 pg (ref 26.0–34.0)
MCH: 29.7 pg (ref 26.0–34.0)
MCHC: 30.9 g/dL (ref 30.0–36.0)
MCHC: 29.4 g/dL — ABNORMAL LOW (ref 30.0–36.0)
MCV: 100.3 fL — ABNORMAL HIGH (ref 78.0–100.0)
MCV: 100.9 fL — ABNORMAL HIGH (ref 78.0–100.0)
Platelets: 217 10*3/uL (ref 150–400)
Platelets: 224 10*3/uL (ref 150–400)
RBC: 3.55 MIL/uL — ABNORMAL LOW (ref 3.87–5.11)
RBC: 3.47 MIL/uL — ABNORMAL LOW (ref 3.87–5.11)
RDW: 16.3 % — ABNORMAL HIGH (ref 11.5–15.5)
RDW: 16.5 % — ABNORMAL HIGH (ref 11.5–15.5)
WBC: 6.6 10*3/uL (ref 4.0–10.5)
WBC: 5.3 10*3/uL (ref 4.0–10.5)

## 2016-02-24 LAB — CREATININE, SERUM
Creatinine, Ser: 1.61 mg/dL — ABNORMAL HIGH (ref 0.44–1.00)
GFR calc Af Amer: 35 mL/min — ABNORMAL LOW (ref >60)
GFR calc non Af Amer: 30 mL/min — ABNORMAL LOW (ref >60)

## 2016-02-24 LAB — MRSA PCR SCREENING: MRSA by PCR: NEGATIVE

## 2016-02-24 LAB — LACTIC ACID, PLASMA: Lactic Acid, Venous: 1.3 mmol/L (ref 0.5–1.9)

## 2016-02-24 LAB — TSH: TSH: 0.846 u[IU]/mL (ref 0.350–4.500)

## 2016-02-24 MED ORDER — SODIUM CHLORIDE 0.9 % IV SOLN: INTRAVENOUS | Status: DC

## 2016-02-24 MED ORDER — VANCOMYCIN HCL 10 G IV SOLR
1500.0000 mg | Freq: Once | INTRAVENOUS | Status: AC
  Administered 2016-02-24: 1500 mg via INTRAVENOUS
  Filled 2016-02-24: qty 1500

## 2016-02-24 MED ORDER — PRO-STAT SUGAR FREE PO LIQD
30.0000 mL | Freq: Two times a day (BID) | ORAL | Status: DC
  Administered 2016-02-24 – 2016-02-26 (×5): 30 mL via ORAL
  Filled 2016-02-24 (×5): qty 30

## 2016-02-24 MED ORDER — DEXTROSE 5 % IV SOLN
1.0000 g | Freq: Three times a day (TID) | INTRAVENOUS | Status: DC
  Administered 2016-02-24 – 2016-02-26 (×7): 1 g via INTRAVENOUS
  Filled 2016-02-24 (×11): qty 1

## 2016-02-24 MED ORDER — ONDANSETRON HCL 4 MG/2ML IJ SOLN: 4.0000 mg | Freq: Four times a day (QID) | INTRAMUSCULAR | Status: DC | PRN

## 2016-02-24 MED ORDER — PAROXETINE HCL 20 MG PO TABS
40.0000 mg | ORAL_TABLET | Freq: Every day | ORAL | Status: DC
  Administered 2016-02-24 – 2016-02-25 (×2): 40 mg via ORAL
  Filled 2016-02-24 (×2): qty 2

## 2016-02-24 MED ORDER — LAMOTRIGINE 100 MG PO TABS
100.0000 mg | ORAL_TABLET | Freq: Two times a day (BID) | ORAL | Status: DC
  Administered 2016-02-24 – 2016-02-26 (×5): 100 mg via ORAL
  Filled 2016-02-24 (×5): qty 1

## 2016-02-24 MED ORDER — SENNA 8.6 MG PO TABS
1.0000 | ORAL_TABLET | Freq: Two times a day (BID) | ORAL | Status: DC
  Administered 2016-02-24 – 2016-02-26 (×5): 8.6 mg via ORAL
  Filled 2016-02-24 (×5): qty 1

## 2016-02-24 MED ORDER — ADULT MULTIVITAMIN W/MINERALS CH
1.0000 | ORAL_TABLET | Freq: Every day | ORAL | Status: DC
  Administered 2016-02-24 – 2016-02-26 (×3): 1 via ORAL
  Filled 2016-02-24 (×3): qty 1

## 2016-02-24 MED ORDER — BENZTROPINE MESYLATE 1 MG PO TABS
0.5000 mg | ORAL_TABLET | Freq: Two times a day (BID) | ORAL | Status: DC
  Administered 2016-02-24 – 2016-02-26 (×5): 0.5 mg via ORAL
  Filled 2016-02-24 (×5): qty 1

## 2016-02-24 MED ORDER — PANTOPRAZOLE SODIUM 40 MG PO TBEC
40.0000 mg | DELAYED_RELEASE_TABLET | Freq: Every day | ORAL | Status: DC
  Administered 2016-02-24 – 2016-02-26 (×3): 40 mg via ORAL
  Filled 2016-02-24 (×3): qty 1

## 2016-02-24 MED ORDER — POLYETHYLENE GLYCOL 3350 17 G PO PACK
17.0000 g | PACK | Freq: Every day | ORAL | Status: DC
  Administered 2016-02-24 – 2016-02-26 (×3): 17 g via ORAL
  Filled 2016-02-24 (×3): qty 1

## 2016-02-24 MED ORDER — ONDANSETRON HCL 4 MG PO TABS: 4.0000 mg | ORAL_TABLET | Freq: Three times a day (TID) | ORAL | Status: DC | PRN

## 2016-02-24 MED ORDER — RISPERIDONE 0.5 MG PO TABS
0.2500 mg | ORAL_TABLET | Freq: Three times a day (TID) | ORAL | Status: DC
  Administered 2016-02-24 – 2016-02-25 (×6): 0.25 mg via ORAL
  Administered 2016-02-26: 0.5 mg via ORAL
  Filled 2016-02-24 (×7): qty 1

## 2016-02-24 MED ORDER — BUPROPION HCL ER (XL) 300 MG PO TB24
300.0000 mg | ORAL_TABLET | Freq: Every day | ORAL | Status: DC
  Administered 2016-02-24 – 2016-02-26 (×3): 300 mg via ORAL
  Filled 2016-02-24 (×5): qty 1

## 2016-02-24 MED ORDER — INFLUENZA VAC SPLIT QUAD 0.5 ML IM SUSY
0.5000 mL | PREFILLED_SYRINGE | INTRAMUSCULAR | Status: AC
  Administered 2016-02-25: 0.5 mL via INTRAMUSCULAR
  Filled 2016-02-24: qty 0.5

## 2016-02-24 MED ORDER — HEPARIN SODIUM (PORCINE) 5000 UNIT/ML IJ SOLN
5000.0000 [IU] | Freq: Three times a day (TID) | INTRAMUSCULAR | Status: DC
  Administered 2016-02-24 – 2016-02-26 (×7): 5000 [IU] via SUBCUTANEOUS
  Filled 2016-02-24 (×7): qty 1

## 2016-02-24 MED ORDER — INSULIN ASPART 100 UNIT/ML ~~LOC~~ SOLN
0.0000 [IU] | SUBCUTANEOUS | Status: DC
  Administered 2016-02-24: 1 [IU] via SUBCUTANEOUS

## 2016-02-24 MED ORDER — DONEPEZIL HCL 5 MG PO TABS
5.0000 mg | ORAL_TABLET | Freq: Every day | ORAL | Status: DC
  Administered 2016-02-24 – 2016-02-25 (×2): 5 mg via ORAL
  Filled 2016-02-24 (×2): qty 1

## 2016-02-24 MED ORDER — KETOCONAZOLE 2 % EX SHAM
1.0000 "application " | MEDICATED_SHAMPOO | CUTANEOUS | Status: DC
  Administered 2016-02-26: 1 via TOPICAL
  Filled 2016-02-24: qty 120

## 2016-02-24 MED ORDER — IPRATROPIUM-ALBUTEROL 0.5-2.5 (3) MG/3ML IN SOLN: 3.0000 mL | Freq: Four times a day (QID) | RESPIRATORY_TRACT | Status: DC | PRN

## 2016-02-24 MED ORDER — ASPIRIN EC 81 MG PO TBEC
81.0000 mg | DELAYED_RELEASE_TABLET | ORAL | Status: DC
  Administered 2016-02-24 – 2016-02-26 (×3): 81 mg via ORAL
  Filled 2016-02-24 (×3): qty 1

## 2016-02-24 MED ORDER — ACETAMINOPHEN 325 MG PO TABS: 650.0000 mg | ORAL_TABLET | Freq: Four times a day (QID) | ORAL | Status: DC | PRN

## 2016-02-24 MED ORDER — TERAZOSIN HCL 1 MG PO CAPS
2.0000 mg | ORAL_CAPSULE | Freq: Every day | ORAL | Status: DC
  Administered 2016-02-24 – 2016-02-25 (×2): 2 mg via ORAL
  Filled 2016-02-24 (×2): qty 2

## 2016-02-24 MED ORDER — ONDANSETRON HCL 4 MG PO TABS: 4.0000 mg | ORAL_TABLET | Freq: Four times a day (QID) | ORAL | Status: DC | PRN

## 2016-02-24 MED ORDER — DEXTROSE-NACL 5-0.9 % IV SOLN
INTRAVENOUS | Status: DC
  Administered 2016-02-24 – 2016-02-25 (×4): via INTRAVENOUS

## 2016-02-24 MED ORDER — OXYBUTYNIN CHLORIDE ER 5 MG PO TB24
10.0000 mg | ORAL_TABLET | Freq: Every day | ORAL | Status: DC
  Administered 2016-02-24 – 2016-02-26 (×3): 10 mg via ORAL
  Filled 2016-02-24 (×3): qty 2

## 2016-02-24 MED ORDER — BISACODYL 5 MG PO TBEC: 10.0000 mg | DELAYED_RELEASE_TABLET | Freq: Three times a day (TID) | ORAL | Status: DC | PRN

## 2016-02-24 MED ORDER — RISPERIDONE 1 MG PO TABS
1.0000 mg | ORAL_TABLET | Freq: Every day | ORAL | Status: DC
  Administered 2016-02-24 – 2016-02-25 (×2): 1 mg via ORAL
  Filled 2016-02-24 (×2): qty 1

## 2016-02-24 MED ORDER — INSULIN ASPART 100 UNIT/ML ~~LOC~~ SOLN: 0.0000 [IU] | Freq: Every day | SUBCUTANEOUS | Status: DC

## 2016-02-24 MED ORDER — INSULIN ASPART 100 UNIT/ML ~~LOC~~ SOLN: 0.0000 [IU] | Freq: Three times a day (TID) | SUBCUTANEOUS | Status: DC

## 2016-02-24 MED ORDER — VANCOMYCIN HCL IN DEXTROSE 1-5 GM/200ML-% IV SOLN
1000.0000 mg | INTRAVENOUS | Status: DC
  Administered 2016-02-25 – 2016-02-26 (×2): 1000 mg via INTRAVENOUS
  Filled 2016-02-24 (×2): qty 200

## 2016-02-24 NOTE — H&P (Signed)
History and Physical    Amy Clay ZOX:[id number] DOB: 03-25-1941 DOA: 02/23/2016  Referring MD/NP/PA: Samuel Jester, DO PCP: Colette Ribas, MD Outpatient Specialists:  Gastroenterology: Corbin Ade, MD  Patient coming from: Nursing home  Chief Complaint: Altered mental status on baseline dementia admitted for UTI  HPI: Amy Clay is a 75 y.o. female with medical history significant of CKD stage 3, hx of dementia, hx of DVT, mood disorder, HTN, HLD, anxiety and depression presents to the ED via EMS with confusion. She makes grimaces and is very lethargic. Unable to obtain history due to patients condition.  I took care of her previously for similar presentation.  At that time, her anticoagulation was discontinued, and her code status was confirmed as DNR.   ED Course: While in the ED, BUN 42, Cr 1.68, albumin 2.8, ALT 12, Hgb 10.5, UA shows many bacteria. CT head shows no acute illnesses. CXR shows shallow lung inflation. EKG shows sinus rhythm with prolonged PR intervals. She will be admitted for treatment.  Review of Systems: As per HPI otherwise 10 point review of systems negative.   Past Medical History:  Diagnosis Date  . Altered mental status   . Anxiety   . Arthritis   . Bipolar 1 disorder (HCC)   . Chronic kidney disease   . Dementia   . Depression   . Dysphagia   . GERD (gastroesophageal reflux disease)   . Hemorrhoids   . High blood pressure   . Hyperlipidemia   . Hypoglycemia   . Localized edema   . Osteoarthritis   . Thrombocytopenia (HCC)   . Weakness     Past Surgical History:  Procedure Laterality Date  . ABDOMINAL HYSTERECTOMY    . ANKLE SURGERY  04/28/97   right ankle fracture  . BACK SURGERY  03/16/87  . bladder tacked  04/04/95  . BREAST BIOPSY  05/16/95   right side  . CHOLECYSTECTOMY  08/15/93  . COLONOSCOPY  10/26/2002   Dr. Karilyn Cota- small external hemorrhoids otherwise normal   . MALONEY DILATION  10/31/2011   Procedure:  Elease Hashimoto DILATION;  Surgeon: Corbin Ade, MD;  Location: [facility];  Service: Endoscopy;  Laterality: N/A;  . PARTIAL HYSTERECTOMY  04/04/95  . SAVORY DILATION  10/31/2011   Procedure: SAVORY DILATION;  Surgeon: Corbin Ade, MD;  Location: [facility];  Service: Endoscopy;  Laterality: N/A;  . WRIST SURGERY  02/24/09   right wrist fracture     reports that she has never smoked. She has never used smokeless tobacco. She reports that she does not drink alcohol or use drugs.  Allergies  Allergen Reactions  . Benzodiazepines Other (See Comments)    Memory problems on Ativan, which goes away when she is off of it.   . Ciprofloxacin     Rash at site of injection with itching  . Cephalexin Rash and Other (See Comments)    Blistering of the mouth  . Penicillins Rash    Family History  Problem Relation Age of Onset  . Arthritis    . Asthma    . Diabetes    . Anxiety disorder Paternal Aunt   . Anxiety disorder Paternal Uncle   . Bipolar disorder Cousin   . Colon cancer Neg Hx     Prior to Admission medications   Medication Sig Start Date End Date Taking? Authorizing Provider  acetaminophen (TYLENOL) 325 MG tablet Take 650 mg by mouth every 6 (six) hours as needed for  mild pain or moderate pain.   Yes Historical Provider, MD  Amino Acids-Protein Hydrolys (FEEDING SUPPLEMENT, PRO-STAT SUGAR FREE 64,) LIQD Take 30 mLs by mouth 2 (two) times daily.   Yes Historical Provider, MD  aspirin EC 81 MG tablet Take 81 mg by mouth every morning.    Yes Historical Provider, MD  benztropine (COGENTIN) 0.5 MG tablet Take 1 tablet (0.5 mg total) by mouth 2 (two) times daily. 12/22/15  Yes Myrlene Broker, MD  bisacodyl (DULCOLAX) 5 MG EC tablet Take 10 mg by mouth every 8 (eight) hours as needed for moderate constipation.   Yes Historical Provider, MD  buPROPion (WELLBUTRIN XL) 300 MG 24 hr tablet Take 1 tablet (300 mg total) by mouth every morning. 12/22/15 12/21/16 Yes Myrlene Broker, MD    Calcium Carb-Vit D-C-E-Mineral (OS-CAL ULTRA) 600 MG TABS Take by mouth 2 (two) times daily.   Yes Historical Provider, MD  Cholecalciferol (VITAMIN D) 2000 UNITS CAPS Take 1 capsule by mouth every morning.    Yes Historical Provider, MD  clonazePAM (KLONOPIN) 0.5 MG tablet Take daily at 5 pm Patient taking differently: Take 0.5 mg by mouth daily. Take daily at 5 pm. *May take every 12 hours as needed for anxiety/agitation 08/22/15  Yes Myrlene Broker, MD  donepezil (ARICEPT) 5 MG tablet Take 1 tablet (5 mg total) by mouth at bedtime. 12/22/15 12/21/16 Yes Myrlene Broker, MD  furosemide (LASIX) 20 MG tablet Take 20 mg by mouth every Monday, Wednesday, and Friday.    Yes Historical Provider, MD  guaifenesin (HUMIBID E) 400 MG TABS tablet Take 400 mg by mouth 2 (two) times daily.   Yes Historical Provider, MD  ipratropium-albuterol (DUONEB) 0.5-2.5 (3) MG/3ML SOLN Take 3 mLs by nebulization every 6 (six) hours as needed (for cough).   Yes Historical Provider, MD  ketoconazole (NIZORAL) 2 % shampoo Apply 1 application topically 2 (two) times a week. Every Sunday and Wednesday for dry scalp   Yes Historical Provider, MD  lamoTRIgine (LAMICTAL) 100 MG tablet Take 1 tablet (100 mg total) by mouth 2 (two) times daily. 12/22/15  Yes Myrlene Broker, MD  loratadine (CLARITIN) 10 MG tablet Take 10 mg by mouth daily.   Yes Historical Provider, MD  Multiple Vitamin (MULTIVITAMIN WITH MINERALS) TABS tablet Take 1 tablet by mouth daily.   Yes Historical Provider, MD  omeprazole (PRILOSEC) 20 MG capsule Take 20 mg by mouth at bedtime.    Yes Historical Provider, MD  ondansetron (ZOFRAN) 4 MG tablet Take 4 mg by mouth every 8 (eight) hours as needed for nausea or vomiting.   Yes Historical Provider, MD  oxybutynin (DITROPAN-XL) 10 MG 24 hr tablet Take 10 mg by mouth daily.    Yes Historical Provider, MD  PARoxetine (PAXIL) 40 MG tablet Take 1 tablet (40 mg total) by mouth at bedtime. 12/22/15 12/21/16 Yes Myrlene Broker,  MD  polyethylene glycol [facility] / Ethelene Hal) packet Take 17 g by mouth daily.    Yes Historical Provider, MD  risperiDONE (RISPERDAL) 0.25 MG tablet Take 1 tablet (0.25 mg total) by mouth 3 (three) times daily. 12/22/15  Yes Myrlene Broker, MD  risperiDONE (RISPERDAL) 1 MG tablet Take 1 tablet (1 mg total) by mouth at bedtime. 12/22/15  Yes Myrlene Broker, MD  senna (SENOKOT) 8.6 MG TABS tablet Take 1 tablet by mouth 2 (two) times daily.   Yes Historical Provider, MD  terazosin (HYTRIN) 2 MG capsule Take 2 mg by mouth  at bedtime.    Yes Historical Provider, MD    Physical Exam: Vitals:   02/23/16 2205 02/23/16 2215 02/23/16 2255 02/23/16 2330  BP: 139/75  110/63 113/63  Pulse: 63   61  Resp:  18  17  Temp: 97.1 F (36.2 C)     TempSrc: Rectal     SpO2: 97%  95% 96%      Constitutional: NAD, calm, comfortable Vitals:   02/23/16 2205 02/23/16 2215 02/23/16 2255 02/23/16 2330  BP: 139/75  110/63 113/63  Pulse: 63   61  Resp:  18  17  Temp: 97.1 F (36.2 C)     TempSrc: Rectal     SpO2: 97%  95% 96%   Eyes: PERRL, lids and conjunctivae normal ENMT: Mucous membranes are moist. Posterior pharynx clear of any exudate or lesions.Normal dentition.  Neck: normal, supple, no masses, no thyromegaly Respiratory: clear to auscultation bilaterally, no wheezing, no crackles. Normal respiratory effort. No accessory muscle use.  Cardiovascular: Regular rate and rhythm, no murmurs / rubs / gallops. No extremity edema. 2+ pedal pulses. No carotid bruits.  Abdomen: no tenderness, no masses palpated. No hepatosplenomegaly. Bowel sounds positive.  Musculoskeletal: no clubbing / cyanosis. No joint deformity upper and lower extremities. Good ROM, no contractures. Normal muscle tone.  Skin: no rashes, lesions, ulcers. No induration Neurologic: CN 2-12 grossly intact. Sensation intact, DTR normal. Strength 5/5 in all 4.  Psychiatric: Normal judgment and insight. Alert and oriented x 3. Normal mood.    Labs on Admission: I have personally reviewed following labs and imaging studies  CBC:  Recent Labs Lab 02/23/16 2248  WBC 6.1  NEUTROABS 3.8  HGB 10.5*  HCT 34.5*  MCV 100.0  PLT 218   Basic Metabolic Panel:  Recent Labs Lab 02/23/16 2248  NA 136  K 4.5  CL 103  CO2 28  GLUCOSE 89  BUN 42*  CREATININE 1.68*  CALCIUM 9.2   GFR: CrCl cannot be calculated (Unknown ideal weight.). Liver Function Tests:  Recent Labs Lab 02/23/16 2248  AST 18  ALT 12*  ALKPHOS 81  BILITOT 0.5  PROT 6.2*  ALBUMIN 2.8*   Coagulation Profile:  Recent Labs Lab 02/23/16 2248  INR 0.94   Cardiac Enzymes:  Recent Labs Lab 02/23/16 2248  TROPONINI <0.03    Urine analysis:    Component Value Date/Time   COLORURINE YELLOW 02/23/2016 2225   APPEARANCEUR CLOUDY (A) 02/23/2016 2225   LABSPEC 1.020 02/23/2016 2225   PHURINE 6.0 02/23/2016 2225   GLUCOSEU NEGATIVE 02/23/2016 2225   HGBUR SMALL (A) 02/23/2016 2225   BILIRUBINUR NEGATIVE 02/23/2016 2225   KETONESUR NEGATIVE 02/23/2016 2225   PROTEINUR NEGATIVE 02/23/2016 2225   UROBILINOGEN 0.2 08/19/2014 0032   NITRITE NEGATIVE 02/23/2016 2225   LEUKOCYTESUR LARGE (A) 02/23/2016 2225    Radiological Exams on Admission: Ct Head Wo Contrast  Result Date: 02/23/2016 CLINICAL DATA:  Patient is stents and rigid, altered mental status EXAM: CT HEAD WITHOUT CONTRAST TECHNIQUE: Contiguous axial images were obtained from the base of the skull through the vertex without intravenous contrast. COMPARISON:  07/15/2015, 08/19/2014 FINDINGS: Brain: There is mild motion artifact. No large vessel territorial infarction. No hemorrhage. No mass effect. Stable degree of ventricular enlargement. Moderate atrophy. Moderate confluent white matter presumed small vessel disease. Prominent extra-axial CSF space along the left parietal convexity is unchanged allowing for differences in head positioning. Vascular: Calcifications present within the  carotid arteries at the skullbase. No hyperdense vessels. Skull: Mastoid  air cells are clear.  There is no fracture. Sinuses/Orbits: Orbits similar compared to prior with left lens implant. Paranasal sinuses are grossly clear. Other: None IMPRESSION: 1. No definite CT evidence for acute intracranial abnormality allowing for motion artifact. 2. Atrophy. Moderate periventricular presumed small vessel white matter disease. Electronically Signed   By: Jasmine Pang M.D.   On: 02/23/2016 23:08   Dg Chest Port 1 View  Result Date: 02/23/2016 CLINICAL DATA:  Altered mental status EXAM: PORTABLE CHEST 1 VIEW COMPARISON:  Chest radiograph 07/17/2015 FINDINGS: There is shallow lung inflation. No focal airspace consolidation. There is right basilar atelectasis, increased from the prior study. No pneumothorax or sizable pleural effusion. No pulmonary edema. Unchanged cardiomediastinal contours. IMPRESSION: Shallow lung inflation.  No focal consolidation or pulmonary edema. Electronically Signed   By: Deatra Robinson M.D.   On: 02/23/2016 22:50    EKG: Independently reviewed. Sinus rhythm with prolonged PR interval  Assessment/Plan Principal Problem:   Altered mental status Active Problems:   History of cardiovascular disorder   Dementia   Benign essential HTN   Lower urinary tract infectious disease    Will admit her for AMS (more lethargy) on baseline dementia, felt to be due to UTI and mild dehydration with mild AKI on CKD3.  Given her having allergy to PCN, cephalosporin, and quinolone, I have decided to continue with Azactam with pharmacy dosing.   Her urine culture was obtained, and she will be given IVF gently as well. She is too lethargic to eat, so she was made NPO at this time.  With her hx of HTN, her SBP is 92 now, and I will hold her antiHTN as with her UTI, and dehydration, she is at risk for being hypotensive.  Will continue with her antipsychotic medication given for agitation and mood disorder  as well. She will be given ASA low dose, and will place her on subQ heparin.    DVT prophylaxis: Heparin Code Status: DNR Family Communication: No family at bedside  Disposition Plan: Discharge once improved Consults called: None Admission status: Admit to observation   Houston Siren, MD   FACP [facility] If 7PM-7AM, please contact night-coverage [email or web address] Password [email or web address]  02/24/2016, 12:13 AM   By signing my name below, I, Bobbie Stack, attest that this documentation has been prepared under the direction and in the presence of Houston Siren, MD. Electronically signed: Bobbie Stack, Scribe.  02/24/16, 12:35 AM

## 2016-02-24 NOTE — ED Notes (Signed)
Pt is more alert at this time.  Eyes are open and pt makes eye contact when name is called.

## 2016-02-24 NOTE — Progress Notes (Signed)
PROGRESS NOTE  Amy Clay  ZOX:[id number] DOB: [birth year] DOA: 02/23/2016 PCP: Colette Ribas, MD Outpatient Specialists:  Subjective: Follow commands, awake and alert.  Brief Narrative:  Amy Clay is a 75 y.o. female with medical history significant of CKD stage 3, hx of dementia, hx of DVT, mood disorder, HTN, HLD, anxiety and depression presents to the ED via EMS with confusion. She makes grimaces and is very lethargic. Unable to obtain history due to patients condition.  I took care of her previously for similar presentation.  At that time, her anticoagulation was discontinued, and her code status was confirmed as DNR.   Assessment & Plan:   Principal Problem:   Altered mental status Active Problems:   History of cardiovascular disorder   Dementia   Benign essential HTN   Lower urinary tract infectious disease   Pressure injury of skin   This is a no charge note, patient seen earlier today by my colleague Dr. Nedra Hai. Patient seen and examined, and data base reviewed. Came in with UTI and lethargy and penicillin/cephalexin and ciprofloxacin allergy. Started on aztreonam, appears to be doing better, restart oral medications and advance diet as tolerated.   DVT prophylaxis: Subcutaneous heparin Code Status: DNR Family Communication:  Disposition Plan:  Diet: Diet NPO time specified  Consultants:   None  Procedures:   None  Antimicrobials:   Aztreonam  Objective: Vitals:   02/24/16 0036 02/24/16 0146 02/24/16 0300 02/24/16 0422  BP:  132/69  (!) 117/55  Pulse:  (!) 57  (!) 56  Resp: 16   17  Temp:  97.5 F (36.4 C)  97.6 F (36.4 C)  TempSrc:  Axillary  Axillary  SpO2:  98%  98%  Weight:  84.1 kg (185 lb 4.8 oz)    Height:   5\' 6"  (1.676 m)     Intake/Output Summary (Last 24 hours) at 02/24/16 0847 Last data filed at 02/24/16 0424  Gross per 24 hour  Intake           251.25 ml  Output                0 ml  Net           251.25 ml    Filed Weights   02/24/16 0146  Weight: 84.1 kg (185 lb 4.8 oz)    Examination: General exam: Appears calm and comfortable  Respiratory system: Clear to auscultation. Respiratory effort normal. Cardiovascular system: S1 & S2 heard, RRR. No JVD, murmurs, rubs, gallops or clicks. No pedal edema. Gastrointestinal system: Abdomen is nondistended, soft and nontender. No organomegaly or masses felt. Normal bowel sounds heard. Central nervous system: Alert and oriented. No focal neurological deficits. Extremities: Symmetric 5 x 5 power. Skin: No rashes, lesions or ulcers Psychiatry: Judgement and insight appear normal. Mood & affect appropriate.   Data Reviewed: I have personally reviewed following labs and imaging studies  CBC:  Recent Labs Lab 02/23/16 2248 02/24/16 0145 02/24/16 0544  WBC 6.1 6.6 5.3  NEUTROABS 3.8  --   --   HGB 10.5* 11.0* 10.3*  HCT 34.5* 35.6* 35.0*  MCV 100.0 100.3* 100.9*  PLT 218 217 224   Basic Metabolic Panel:  Recent Labs Lab 02/23/16 2248 02/24/16 0145 02/24/16 0544  NA 136  --  140  K 4.5  --  4.3  CL 103  --  105  CO2 28  --  31  GLUCOSE 89  --  98  BUN 42*  --  42*  CREATININE 1.68* 1.61* 1.48*  CALCIUM 9.2  --  9.3   GFR: Estimated Creatinine Clearance: 35.3 mL/min (by C-G formula based on SCr of 1.48 mg/dL (H)). Liver Function Tests:  Recent Labs Lab 02/23/16 2248 02/24/16 0544  AST 18 14*  ALT 12* 11*  ALKPHOS 81 85  BILITOT 0.5 0.4  PROT 6.2* 5.8*  ALBUMIN 2.8* 2.7*   No results for input(s): LIPASE, AMYLASE in the last 168 hours. No results for input(s): AMMONIA in the last 168 hours. Coagulation Profile:  Recent Labs Lab 02/23/16 2248  INR 0.94   Cardiac Enzymes:  Recent Labs Lab 02/23/16 2248  TROPONINI <0.03   BNP (last 3 results) No results for input(s): PROBNP in the last 8760 hours. HbA1C: No results for input(s): HGBA1C in the last 72 hours. CBG:  Recent Labs Lab 02/24/16 0435  02/24/16 0734  GLUCAP 90 77   Lipid Profile: No results for input(s): CHOL, HDL, LDLCALC, TRIG, CHOLHDL, LDLDIRECT in the last 72 hours. Thyroid Function Tests:  Recent Labs  02/24/16 0145  TSH 0.846   Anemia Panel: No results for input(s): VITAMINB12, FOLATE, FERRITIN, TIBC, IRON, RETICCTPCT in the last 72 hours. Urine analysis:    Component Value Date/Time   COLORURINE YELLOW 02/23/2016 2225   APPEARANCEUR CLOUDY (A) 02/23/2016 2225   LABSPEC 1.020 02/23/2016 2225   PHURINE 6.0 02/23/2016 2225   GLUCOSEU NEGATIVE 02/23/2016 2225   HGBUR SMALL (A) 02/23/2016 2225   BILIRUBINUR NEGATIVE 02/23/2016 2225   KETONESUR NEGATIVE 02/23/2016 2225   PROTEINUR NEGATIVE 02/23/2016 2225   UROBILINOGEN 0.2 08/19/2014 0032   NITRITE NEGATIVE 02/23/2016 2225   LEUKOCYTESUR LARGE (A) 02/23/2016 2225   Sepsis Labs: @LABRCNTIP (procalcitonin:4,lacticidven:4)  ) Recent Results (from the past 240 hour(s))  MRSA PCR Screening     Status: None   Collection Time: 02/24/16  1:50 AM  Result Value Ref Range Status   MRSA by PCR NEGATIVE NEGATIVE Final    Comment:        The GeneXpert MRSA Assay (FDA approved for NASAL specimens only), is one component of a comprehensive MRSA colonization surveillance program. It is not intended to diagnose MRSA infection nor to guide or monitor treatment for MRSA infections.      Invalid input(s): PROCALCITONIN, LACTICACIDVEN   Radiology Studies: Ct Head Wo Contrast  Result Date: 02/23/2016 CLINICAL DATA:  Patient is stents and rigid, altered mental status EXAM: CT HEAD WITHOUT CONTRAST TECHNIQUE: Contiguous axial images were obtained from the base of the skull through the vertex without intravenous contrast. COMPARISON:  07/15/2015, 08/19/2014 FINDINGS: Brain: There is mild motion artifact. No large vessel territorial infarction. No hemorrhage. No mass effect. Stable degree of ventricular enlargement. Moderate atrophy. Moderate confluent white  matter presumed small vessel disease. Prominent extra-axial CSF space along the left parietal convexity is unchanged allowing for differences in head positioning. Vascular: Calcifications present within the carotid arteries at the skullbase. No hyperdense vessels. Skull: Mastoid air cells are clear.  There is no fracture. Sinuses/Orbits: Orbits similar compared to prior with left lens implant. Paranasal sinuses are grossly clear. Other: None IMPRESSION: 1. No definite CT evidence for acute intracranial abnormality allowing for motion artifact. 2. Atrophy. Moderate periventricular presumed small vessel white matter disease. Electronically Signed   By: Jasmine Pang M.D.   On: 02/23/2016 23:08   Dg Chest Port 1 View  Result Date: 02/23/2016 CLINICAL DATA:  Altered mental status EXAM: PORTABLE CHEST 1 VIEW COMPARISON:  Chest radiograph 07/17/2015 FINDINGS: There is shallow  lung inflation. No focal airspace consolidation. There is right basilar atelectasis, increased from the prior study. No pneumothorax or sizable pleural effusion. No pulmonary edema. Unchanged cardiomediastinal contours. IMPRESSION: Shallow lung inflation.  No focal consolidation or pulmonary edema. Electronically Signed   By: Deatra RobinsonKevin  Herman M.D.   On: 02/23/2016 22:50        Scheduled Meds: . aspirin EC  81 mg Oral [id number]  . aztreonam  1 g Intravenous Q8H  . benztropine  0.5 mg Oral BID  . buPROPion  300 mg Oral Daily  . donepezil  5 mg Oral QHS  . feeding supplement (PRO-STAT SUGAR FREE 64)  30 mL Oral BID  . heparin  5,000 Units Subcutaneous Q8H  . [START ON 02/25/2016] Influenza vac split quadrivalent PF  0.5 mL Intramuscular Tomorrow-1000  . insulin aspart  0-9 Units Subcutaneous Q4H  . [START ON 02/26/2016] ketoconazole  1 application Topical Once per day on Mon Thu  . lamoTRIgine  100 mg Oral BID  . multivitamin with minerals  1 tablet Oral Daily  . oxybutynin  10 mg Oral Daily  . pantoprazole  40 mg Oral Daily  .  PARoxetine  40 mg Oral QHS  . polyethylene glycol  17 g Oral Daily  . risperiDONE  0.25 mg Oral TID  . risperiDONE  1 mg Oral QHS  . senna  1 tablet Oral BID  . terazosin  2 mg Oral QHS   Continuous Infusions: . dextrose 5 % and 0.9% NaCl 75 mL/hr at 02/24/16 0223     LOS: 0 days    Time spent: 35 minutes    [place] Shannahan A, MD [facility] Pager [phone]  If 7PM-7AM, please contact night-coverage [email or web address] Password [email or web address] 02/24/2016, 8:47 AM

## 2016-02-24 NOTE — Progress Notes (Signed)
Pharmacy Antibiotic Note  Amy Clay is a 75 y.o. female admitted on 02/23/2016 with UTI.  Pharmacy has been consulted for aztreonam and vancomycin dosing.  Plan: Aztreonam 1gm IV q8 hours Vancomycin 1500 mg IV x 1 then 1gm IV q24 hours F/u renal function, cultures and clinical course  Height: 5\' 6"  (167.6 cm) Weight: 185 lb 4.8 oz (84.1 kg) IBW/kg (Calculated) : 59.3  Temp (24hrs), Avg:97.4 F (36.3 C), Min:97.1 F (36.2 C), Max:97.6 F (36.4 C)   Recent Labs Lab 02/23/16 2248 02/24/16 0127 02/24/16 0145 02/24/16 0544  WBC 6.1  --  6.6 5.3  CREATININE 1.68*  --  1.61* 1.48*  LATICACIDVEN 1.1 1.3  --   --     Estimated Creatinine Clearance: 35.3 mL/min (by C-G formula based on SCr of 1.48 mg/dL (H)).    Allergies  Allergen Reactions  . Benzodiazepines Other (See Comments)    Memory problems on Ativan, which goes away when she is off of it.   . Ciprofloxacin     Rash at site of injection with itching  . Cephalexin Rash and Other (See Comments)    Blistering of the mouth  . Penicillins Rash    Antimicrobials this admission: Aztreonam  10/14 >>  vancomycin 10/14 >>   Thank you for allowing pharmacy to be a part of this patient's care.  Talbert CageSeay, Louvenia Golomb P[place]et 02/24/2016 9:12 AM

## 2016-02-25 DIAGNOSIS — Z8679 Personal history of other diseases of the circulatory system: Secondary | ICD-10-CM

## 2016-02-25 DIAGNOSIS — N390 Urinary tract infection, site not specified: Principal | ICD-10-CM

## 2016-02-25 LAB — CBC
HCT: 31.6 % — ABNORMAL LOW (ref 36.0–46.0)
Hemoglobin: 9.7 g/dL — ABNORMAL LOW (ref 12.0–15.0)
MCH: 30.8 pg (ref 26.0–34.0)
MCHC: 30.7 g/dL (ref 30.0–36.0)
MCV: 100.3 fL — ABNORMAL HIGH (ref 78.0–100.0)
PLATELETS: 203 10*3/uL (ref 150–400)
RBC: 3.15 MIL/uL — ABNORMAL LOW (ref 3.87–5.11)
RDW: 16.2 % — ABNORMAL HIGH (ref 11.5–15.5)
WBC: 4.1 10*3/uL (ref 4.0–10.5)

## 2016-02-25 LAB — BASIC METABOLIC PANEL
ANION GAP: 3 — AB (ref 5–15)
BUN: 35 mg/dL — ABNORMAL HIGH (ref 6–20)
CALCIUM: 8.8 mg/dL — AB (ref 8.9–10.3)
CO2: 27 mmol/L (ref 22–32)
CREATININE: 1.31 mg/dL — AB (ref 0.44–1.00)
Chloride: 109 mmol/L (ref 101–111)
GFR calc Af Amer: 45 mL/min — ABNORMAL LOW (ref >60)
GFR, EST NON AFRICAN AMERICAN: 38 mL/min — AB (ref >60)
GLUCOSE: 94 mg/dL (ref 65–99)
POTASSIUM: 4.1 mmol/L (ref 3.5–5.1)
SODIUM: 139 mmol/L (ref 135–145)

## 2016-02-25 LAB — GLUCOSE, CAPILLARY: GLUCOSE-CAPILLARY: 96 mg/dL (ref 65–99)

## 2016-02-25 NOTE — Progress Notes (Signed)
PROGRESS NOTE  Amy Clay  UJW:[id number] DOB: 1940-12-25 DOA: 02/23/2016 PCP: Colette Ribas, MD Outpatient Specialists:  Subjective: Follow commands, awake and alert.  Brief Narrative:  Amy Clay is a 75 y.o. female with medical history significant of CKD stage 3, hx of dementia, hx of DVT, mood disorder, HTN, HLD, anxiety and depression presents to the ED via EMS with confusion. She makes grimaces and is very lethargic. Unable to obtain history due to patients condition.  I took care of her previously for similar presentation.  At that time, her anticoagulation was discontinued, and her code status was confirmed as DNR.   Assessment & Plan:   Principal Problem:   Altered mental status Active Problems:   History of cardiovascular disorder   Dementia   Benign essential HTN   Lower urinary tract infectious disease   Pressure injury of skin   UTI -Urinalysis consistent with UTI, started on antibiotics.. -Urine culture obtained, patient started on aztreonam and vancomycin. -Allergic to penicillins, ciprofloxacin and cephalexin. Last urine culture showed enterococcus.  Acute encephalopathy -Came in with confusion/lethargy on top of her dementia, likely secondary to UTI. -This is resolved.  Dementia -Patient is demented, continued home medications.  CKD stage III, baseline creatinine 1.4 -Presented with mild achy with creatinine 1.6, activated baseline 1.4.  HTN -Controlled continue home medications.    DVT prophylaxis: Subcutaneous heparin Code Status: DNR Family Communication:  Disposition Plan:  Diet: DIET SOFT Room service appropriate? Yes; Fluid consistency: Thin  Consultants:   None  Procedures:   None  Antimicrobials:   Aztreonam and vancomycin  Objective: Vitals:   02/24/16 1700 02/24/16 2055 02/24/16 2201 02/25/16 0624  BP: (!) 125/59 106/62 106/63 (!) 113/52  Pulse: 76 70 70 60  Resp: 17  17 17   Temp: 97.7 F (36.5 C)  98.4 F  (36.9 C) 97.7 F (36.5 C)  TempSrc: Oral  Oral Oral  SpO2: 95%  97% 96%  Weight:      Height:        Intake/Output Summary (Last 24 hours) at 02/25/16 1056 Last data filed at 02/25/16 0900  Gross per 24 hour  Intake          3381.25 ml  Output                0 ml  Net          3381.25 ml   Filed Weights   02/24/16 0146  Weight: 84.1 kg (185 lb 4.8 oz)    Examination: General exam: Appears calm and comfortable  Respiratory system: Clear to auscultation. Respiratory effort normal. Cardiovascular system: S1 & S2 heard, RRR. No JVD, murmurs, rubs, gallops or clicks. No pedal edema. Gastrointestinal system: Abdomen is nondistended, soft and nontender. No organomegaly or masses felt. Normal bowel sounds heard. Central nervous system: Alert and oriented. No focal neurological deficits. Extremities: Symmetric 5 x 5 power. Skin: No rashes, lesions or ulcers Psychiatry: Judgement and insight appear normal. Mood & affect appropriate.   Data Reviewed: I have personally reviewed following labs and imaging studies  CBC:  Recent Labs Lab 02/23/16 2248 02/24/16 0145 02/24/16 0544 02/25/16 0644  WBC 6.1 6.6 5.3 4.1  NEUTROABS 3.8  --   --   --   HGB 10.5* 11.0* 10.3* 9.7*  HCT 34.5* 35.6* 35.0* 31.6*  MCV 100.0 100.3* 100.9* 100.3*  PLT 218 217 224 203   Basic Metabolic Panel:  Recent Labs Lab 02/23/16 2248 02/24/16 0145 02/24/16 0544 02/25/16 5621  NA 136  --  140 139  K 4.5  --  4.3 4.1  CL 103  --  105 109  CO2 28  --  31 27  GLUCOSE 89  --  98 94  BUN 42*  --  42* 35*  CREATININE 1.68* 1.61* 1.48* 1.31*  CALCIUM 9.2  --  9.3 8.8*   GFR: Estimated Creatinine Clearance: 39.9 mL/min (by C-G formula based on SCr of 1.31 mg/dL (H)). Liver Function Tests:  Recent Labs Lab 02/23/16 2248 02/24/16 0544  AST 18 14*  ALT 12* 11*  ALKPHOS 81 85  BILITOT 0.5 0.4  PROT 6.2* 5.8*  ALBUMIN 2.8* 2.7*   No results for input(s): LIPASE, AMYLASE in the last 168  hours. No results for input(s): AMMONIA in the last 168 hours. Coagulation Profile:  Recent Labs Lab 02/23/16 2248  INR 0.94   Cardiac Enzymes:  Recent Labs Lab 02/23/16 2248  TROPONINI <0.03   BNP (last 3 results) No results for input(s): PROBNP in the last 8760 hours. HbA1C: No results for input(s): HGBA1C in the last 72 hours. CBG:  Recent Labs Lab 02/24/16 0734 02/24/16 1127 02/24/16 1622 02/24/16 2033 02/25/16 0712  GLUCAP 77 98 98 125* 96   Lipid Profile: No results for input(s): CHOL, HDL, LDLCALC, TRIG, CHOLHDL, LDLDIRECT in the last 72 hours. Thyroid Function Tests:  Recent Labs  02/24/16 0145  TSH 0.846   Anemia Panel: No results for input(s): VITAMINB12, FOLATE, FERRITIN, TIBC, IRON, RETICCTPCT in the last 72 hours. Urine analysis:    Component Value Date/Time   COLORURINE YELLOW 02/23/2016 2225   APPEARANCEUR CLOUDY (A) 02/23/2016 2225   LABSPEC 1.020 02/23/2016 2225   PHURINE 6.0 02/23/2016 2225   GLUCOSEU NEGATIVE 02/23/2016 2225   HGBUR SMALL (A) 02/23/2016 2225   BILIRUBINUR NEGATIVE 02/23/2016 2225   KETONESUR NEGATIVE 02/23/2016 2225   PROTEINUR NEGATIVE 02/23/2016 2225   UROBILINOGEN 0.2 08/19/2014 0032   NITRITE NEGATIVE 02/23/2016 2225   LEUKOCYTESUR LARGE (A) 02/23/2016 2225   Sepsis Labs: @LABRCNTIP (procalcitonin:4,lacticidven:4)  ) Recent Results (from the past 240 hour(s))  MRSA PCR Screening     Status: None   Collection Time: 02/24/16  1:50 AM  Result Value Ref Range Status   MRSA by PCR NEGATIVE NEGATIVE Final    Comment:        The GeneXpert MRSA Assay (FDA approved for NASAL specimens only), is one component of a comprehensive MRSA colonization surveillance program. It is not intended to diagnose MRSA infection nor to guide or monitor treatment for MRSA infections.      Invalid input(s): PROCALCITONIN, LACTICACIDVEN   Radiology Studies: Ct Head Wo Contrast  Result Date: 02/23/2016 CLINICAL DATA:   Patient is stents and rigid, altered mental status EXAM: CT HEAD WITHOUT CONTRAST TECHNIQUE: Contiguous axial images were obtained from the base of the skull through the vertex without intravenous contrast. COMPARISON:  07/15/2015, 08/19/2014 FINDINGS: Brain: There is mild motion artifact. No large vessel territorial infarction. No hemorrhage. No mass effect. Stable degree of ventricular enlargement. Moderate atrophy. Moderate confluent white matter presumed small vessel disease. Prominent extra-axial CSF space along the left parietal convexity is unchanged allowing for differences in head positioning. Vascular: Calcifications present within the carotid arteries at the skullbase. No hyperdense vessels. Skull: Mastoid air cells are clear.  There is no fracture. Sinuses/Orbits: Orbits similar compared to prior with left lens implant. Paranasal sinuses are grossly clear. Other: None IMPRESSION: 1. No definite CT evidence for acute intracranial abnormality allowing for  motion artifact. 2. Atrophy. Moderate periventricular presumed small vessel white matter disease. Electronically Signed   By: Jasmine PangKim  Fujinaga M.D.   On: 02/23/2016 23:08   Dg Chest Port 1 View  Result Date: 02/23/2016 CLINICAL DATA:  Altered mental status EXAM: PORTABLE CHEST 1 VIEW COMPARISON:  Chest radiograph 07/17/2015 FINDINGS: There is shallow lung inflation. No focal airspace consolidation. There is right basilar atelectasis, increased from the prior study. No pneumothorax or sizable pleural effusion. No pulmonary edema. Unchanged cardiomediastinal contours. IMPRESSION: Shallow lung inflation.  No focal consolidation or pulmonary edema. Electronically Signed   By: Deatra RobinsonKevin  Herman M.D.   On: 02/23/2016 22:50        Scheduled Meds: . aspirin EC  81 mg Oral BH-q7a  . aztreonam  1 g Intravenous Q8H  . benztropine  0.5 mg Oral BID  . buPROPion  300 mg Oral Daily  . donepezil  5 mg Oral QHS  . feeding supplement (PRO-STAT SUGAR FREE 64)  30  mL Oral BID  . heparin  5,000 Units Subcutaneous Q8H  . [START ON 02/26/2016] ketoconazole  1 application Topical Once per day on Mon Thu  . lamoTRIgine  100 mg Oral BID  . multivitamin with minerals  1 tablet Oral Daily  . oxybutynin  10 mg Oral Daily  . pantoprazole  40 mg Oral Daily  . PARoxetine  40 mg Oral QHS  . polyethylene glycol  17 g Oral Daily  . risperiDONE  0.25 mg Oral TID  . risperiDONE  1 mg Oral QHS  . senna  1 tablet Oral BID  . terazosin  2 mg Oral QHS  . vancomycin  1,000 mg Intravenous Q24H   Continuous Infusions: . dextrose 5 % and 0.9% NaCl 75 mL/hr at 02/25/16 0823     LOS: 1 day    Time spent: 35 minutes    Karn Derk A, MD [facility] Pager [phone]  If 7PM-7AM, please contact night-coverage [email or web address] Password [email or web address] 02/25/2016, 10:56 AM

## 2016-02-26 DIAGNOSIS — G9341 Metabolic encephalopathy: Secondary | ICD-10-CM | POA: Diagnosis present

## 2016-02-26 DIAGNOSIS — E785 Hyperlipidemia, unspecified: Secondary | ICD-10-CM | POA: Diagnosis not present

## 2016-02-26 DIAGNOSIS — R05 Cough: Secondary | ICD-10-CM | POA: Diagnosis not present

## 2016-02-26 DIAGNOSIS — M6281 Muscle weakness (generalized): Secondary | ICD-10-CM | POA: Diagnosis not present

## 2016-02-26 DIAGNOSIS — B952 Enterococcus as the cause of diseases classified elsewhere: Secondary | ICD-10-CM | POA: Diagnosis present

## 2016-02-26 DIAGNOSIS — L8990 Pressure ulcer of unspecified site, unspecified stage: Secondary | ICD-10-CM | POA: Diagnosis present

## 2016-02-26 DIAGNOSIS — E46 Unspecified protein-calorie malnutrition: Secondary | ICD-10-CM | POA: Diagnosis not present

## 2016-02-26 DIAGNOSIS — E559 Vitamin D deficiency, unspecified: Secondary | ICD-10-CM | POA: Diagnosis not present

## 2016-02-26 DIAGNOSIS — R279 Unspecified lack of coordination: Secondary | ICD-10-CM | POA: Diagnosis not present

## 2016-02-26 DIAGNOSIS — N183 Chronic kidney disease, stage 3 (moderate): Secondary | ICD-10-CM | POA: Diagnosis not present

## 2016-02-26 DIAGNOSIS — Z743 Need for continuous supervision: Secondary | ICD-10-CM | POA: Diagnosis not present

## 2016-02-26 DIAGNOSIS — Z8679 Personal history of other diseases of the circulatory system: Secondary | ICD-10-CM | POA: Diagnosis not present

## 2016-02-26 DIAGNOSIS — F319 Bipolar disorder, unspecified: Secondary | ICD-10-CM | POA: Diagnosis present

## 2016-02-26 DIAGNOSIS — Z86718 Personal history of other venous thrombosis and embolism: Secondary | ICD-10-CM | POA: Diagnosis not present

## 2016-02-26 DIAGNOSIS — F0280 Dementia in other diseases classified elsewhere without behavioral disturbance: Secondary | ICD-10-CM | POA: Diagnosis present

## 2016-02-26 DIAGNOSIS — F419 Anxiety disorder, unspecified: Secondary | ICD-10-CM | POA: Diagnosis present

## 2016-02-26 DIAGNOSIS — N390 Urinary tract infection, site not specified: Secondary | ICD-10-CM | POA: Diagnosis not present

## 2016-02-26 DIAGNOSIS — Z66 Do not resuscitate: Secondary | ICD-10-CM | POA: Diagnosis present

## 2016-02-26 DIAGNOSIS — Z23 Encounter for immunization: Secondary | ICD-10-CM | POA: Diagnosis not present

## 2016-02-26 DIAGNOSIS — J9811 Atelectasis: Secondary | ICD-10-CM | POA: Diagnosis not present

## 2016-02-26 DIAGNOSIS — R0789 Other chest pain: Secondary | ICD-10-CM | POA: Diagnosis not present

## 2016-02-26 DIAGNOSIS — D696 Thrombocytopenia, unspecified: Secondary | ICD-10-CM | POA: Diagnosis not present

## 2016-02-26 DIAGNOSIS — D649 Anemia, unspecified: Secondary | ICD-10-CM | POA: Diagnosis not present

## 2016-02-26 DIAGNOSIS — N3289 Other specified disorders of bladder: Secondary | ICD-10-CM | POA: Diagnosis not present

## 2016-02-26 DIAGNOSIS — M1990 Unspecified osteoarthritis, unspecified site: Secondary | ICD-10-CM | POA: Diagnosis not present

## 2016-02-26 DIAGNOSIS — R0981 Nasal congestion: Secondary | ICD-10-CM | POA: Diagnosis not present

## 2016-02-26 DIAGNOSIS — Z7901 Long term (current) use of anticoagulants: Secondary | ICD-10-CM | POA: Diagnosis not present

## 2016-02-26 DIAGNOSIS — R093 Abnormal sputum: Secondary | ICD-10-CM | POA: Diagnosis not present

## 2016-02-26 DIAGNOSIS — I10 Essential (primary) hypertension: Secondary | ICD-10-CM | POA: Diagnosis not present

## 2016-02-26 DIAGNOSIS — I82409 Acute embolism and thrombosis of unspecified deep veins of unspecified lower extremity: Secondary | ICD-10-CM | POA: Diagnosis not present

## 2016-02-26 DIAGNOSIS — E860 Dehydration: Secondary | ICD-10-CM | POA: Diagnosis present

## 2016-02-26 DIAGNOSIS — G309 Alzheimer's disease, unspecified: Secondary | ICD-10-CM | POA: Diagnosis present

## 2016-02-26 DIAGNOSIS — R627 Adult failure to thrive: Secondary | ICD-10-CM | POA: Diagnosis not present

## 2016-02-26 DIAGNOSIS — K219 Gastro-esophageal reflux disease without esophagitis: Secondary | ICD-10-CM | POA: Diagnosis not present

## 2016-02-26 DIAGNOSIS — R1310 Dysphagia, unspecified: Secondary | ICD-10-CM | POA: Diagnosis present

## 2016-02-26 DIAGNOSIS — B370 Candidal stomatitis: Secondary | ICD-10-CM | POA: Diagnosis not present

## 2016-02-26 DIAGNOSIS — R4182 Altered mental status, unspecified: Secondary | ICD-10-CM | POA: Diagnosis not present

## 2016-02-26 DIAGNOSIS — N179 Acute kidney failure, unspecified: Secondary | ICD-10-CM | POA: Diagnosis present

## 2016-02-26 DIAGNOSIS — L89623 Pressure ulcer of left heel, stage 3: Secondary | ICD-10-CM | POA: Diagnosis not present

## 2016-02-26 DIAGNOSIS — I129 Hypertensive chronic kidney disease with stage 1 through stage 4 chronic kidney disease, or unspecified chronic kidney disease: Secondary | ICD-10-CM | POA: Diagnosis present

## 2016-02-26 DIAGNOSIS — D72829 Elevated white blood cell count, unspecified: Secondary | ICD-10-CM | POA: Diagnosis not present

## 2016-02-26 DIAGNOSIS — R079 Chest pain, unspecified: Secondary | ICD-10-CM | POA: Diagnosis not present

## 2016-02-26 LAB — BASIC METABOLIC PANEL
ANION GAP: 4 — AB (ref 5–15)
BUN: 34 mg/dL — ABNORMAL HIGH (ref 6–20)
CHLORIDE: 110 mmol/L (ref 101–111)
CO2: 26 mmol/L (ref 22–32)
Calcium: 8.8 mg/dL — ABNORMAL LOW (ref 8.9–10.3)
Creatinine, Ser: 1.35 mg/dL — ABNORMAL HIGH (ref 0.44–1.00)
GFR calc non Af Amer: 37 mL/min — ABNORMAL LOW (ref >60)
GFR, EST AFRICAN AMERICAN: 43 mL/min — AB (ref >60)
Glucose, Bld: 91 mg/dL (ref 65–99)
POTASSIUM: 4.1 mmol/L (ref 3.5–5.1)
Sodium: 140 mmol/L (ref 135–145)

## 2016-02-26 LAB — CBC
HCT: 31.6 % — ABNORMAL LOW (ref 36.0–46.0)
HEMOGLOBIN: 9.4 g/dL — AB (ref 12.0–15.0)
MCH: 29.7 pg (ref 26.0–34.0)
MCHC: 29.7 g/dL — ABNORMAL LOW (ref 30.0–36.0)
MCV: 99.7 fL (ref 78.0–100.0)
Platelets: 231 10*3/uL (ref 150–400)
RBC: 3.17 MIL/uL — AB (ref 3.87–5.11)
RDW: 15.8 % — ABNORMAL HIGH (ref 11.5–15.5)
WBC: 3.8 10*3/uL — ABNORMAL LOW (ref 4.0–10.5)

## 2016-02-26 MED ORDER — CEFUROXIME AXETIL 500 MG PO TABS: 500.0000 mg | ORAL_TABLET | Freq: Two times a day (BID) | ORAL | 0 refills | Status: AC

## 2016-02-26 MED ORDER — CLONAZEPAM 0.5 MG PO TABS: ORAL_TABLET | ORAL | 0 refills | Status: DC

## 2016-02-26 NOTE — Clinical Social Work Note (Signed)
Clinical Social Work Assessment  Patient Details  Name: Amy Clay MRN: [id number] Date of Birth: 1940-08-23  Date of referral:  02/26/16               Reason for consult:  Discharge Planning                Permission sought to share information with:    Permission granted to share information::     Name::        Agency::     Relationship::     Contact Information:     Housing/Transportation Living arrangements for the past 2 months:  Skilled Nursing Facility Source of Information:  Other (Comment Required) (sister- Amy Clay) Patient Interpreter Needed:  None Criminal Activity/Legal Involvement Pertinent to Current Situation/Hospitalization:  No - Comment as needed Significant Relationships:  Siblings Lives with:  Facility Resident Do you feel safe going back to the place where you live?  Yes Need for family participation in patient care:  No (Coment)  Care giving concerns:  None reported. [facility]    Social Worker assessment / plan:  CSW attempted to meet with pt at bedside. Pt oriented to self only. CSW spoke with pt's sister, Amy Clay on phone. Pt has been a resident at [facility] for about 2 years. Per Amy Blaseebbie at facility, pt is nursing level of care and okay to return. Aware of d/c today. Pt will transport via [facility].   Employment status:  Retired Database administratornsurance information:  Managed Medicare PT Recommendations:  Not assessed at this time Information / Referral to community resources:  Other (Comment Required) (Return to [facility])  Patient/Family's Response to care:  Pt's sister requests return to [facility].   Patient/Family's Understanding of and Emotional Response to Diagnosis, Current Treatment, and Prognosis:  Pt's sister is aware of d/c today.  Emotional Assessment Appearance:  Appears stated age Attitude/Demeanor/Rapport:  Unable to Assess Affect (typically observed):  Unable to Assess Orientation:  Oriented to Self Alcohol / Substance use:  Not  Applicable Psych involvement (Current and /or in the community):  No (Comment)  Discharge Needs  Concerns to be addressed:  Discharge Planning Concerns Readmission within the last 30 days:  No Current discharge risk:    Barriers to Discharge:  No Barriers Identified   Karn CassisStultz, Moon Budde Shanaberger, LCSW 02/26/2016, 1:27 PM [phone]

## 2016-02-26 NOTE — Care Management Important Message (Deleted)
Important Message  Patient Details  Name: Amy Clay MRN: [id number] Date of Birth: 06/09/1940   Medicare Important Message Given:  Yes    Derrich Gaby Demske, RN 02/26/2016, 12:56 PM 

## 2016-02-26 NOTE — Care Management Important Message (Signed)
Important Message  Patient Details  Name: Amy Clay MRN: [id number] Date of Birth: [birth year]   Medicare Important Message Given:  Yes    Malcolm MetroChildress, Hadi Dubin Demske, RN 02/26/2016, 12:52 PM

## 2016-02-26 NOTE — NC FL2 (Signed)
[place] MEDICAID FL2 LEVEL OF CARE SCREENING TOOL     IDENTIFICATION  Patient Name: Amy BoundFrances Hochstetler Birthdate: 1940-08-16 Sex: female Admission Date (Current Location): 02/23/2016  [place]:  [place] [id number] R Facility and Address:  [facility],  [address], [place] [id number]      Provider Number: [id number]  Attending Physician Name and Address:  Clydia LlanoMutaz Elmahi, MD  Relative Name and Phone Number:       Current Level of Care: Hospital Recommended Level of Care: Nursing Facility Prior Approval Number:    Date Approved/Denied:   PASRR Number: [id number]  Discharge Plan: SNF    Current Diagnoses: Patient Active Problem List   Diagnosis Date Noted  . Pressure injury of skin 02/24/2016  . Pressure ulcer 07/16/2015  . Lower urinary tract infectious disease 07/15/2015  . DVT (deep venous thrombosis), unspecified laterality 03/24/2015  . Heel ulcer (HCC) 03/24/2015  . Hematoma of abdominal wall 08/20/2014  . Hypoglycemia 08/20/2014  . Morbid obesity (HCC) 08/20/2014  . Thrombocytopenia (HCC) 08/20/2014  . Altered mental status 08/19/2014  . Altered mental state 08/19/2014  . CKD (chronic kidney disease) stage 3, GFR 30-59 ml/min 08/19/2014  . Bilateral lower extremity edema 08/19/2014  . Hypothermia 08/19/2014  . Dementia 04/20/2014  . Benign essential HTN 04/20/2014  . Physical deconditioning   . Acute on chronic renal failure (HCC) 02/10/2013  . Anemia 02/10/2013  . Fall 02/09/2013  . Ingrown right big toenail 12/30/2012  . 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 Syncope 02/13/2012  . Constipation 10/01/2011  . Dysphagia 10/01/2011  . Abdominal pain 10/01/2011  . Umbilical hernia 10/01/2011  . Syncope and collapse 09/15/2011  . Major depression 09/15/2011  . Arthritis 09/15/2011  . Rotator cuff syndrome of right shoulder 11/13/2010  . CELLULITIS/ABSCESS, LEG 03/03/2007  . History of cardiovascular disorder  02/13/2007    Orientation RESPIRATION BLADDER Height & Weight     Self  Normal Incontinent Weight: 185 lb 4.8 oz (84.1 kg) Height:  5\' 6"  (167.6 cm)  BEHAVIORAL SYMPTOMS/MOOD NEUROLOGICAL BOWEL NUTRITION STATUS  Other (Comment) (none)  (n/a) Incontinent Diet (Soft diet. Low sodium heart healthy)  AMBULATORY STATUS COMMUNICATION OF NEEDS Skin   Total Care Verbally Bruising, Skin abrasions, Other (Comment) (Stage II left leg with foam dressing)                       Personal Care Assistance Level of Assistance  Bathing, Feeding, Dressing Bathing Assistance: Maximum assistance Feeding assistance: Maximum assistance Dressing Assistance: Maximum assistance     Functional Limitations Info  Sight, Hearing, Speech Sight Info: Adequate Hearing Info: Adequate Speech Info: Adequate    SPECIAL CARE FACTORS FREQUENCY                       Contractures      Additional Factors Info  Psychotropic Code Status Info: DNR Allergies Info: Benzodiazepines, Ciprofloxacin, Cephalexin, Penicillins Psychotropic Info: Risperdal   Isolation Precautions Info: 03/24/15 Culture of leg wound grew MRSA      Current Medications (02/26/2016):  This is the current hospital active medication list Current Facility-Administered Medications  Medication Dose Route Frequency Provider Last Rate Last Dose  . acetaminophen (TYLENOL) tablet 650 mg  650 mg Oral Q6H PRN Houston SirenPeter Le, MD      . aspirin EC tablet 81 mg  81 mg Oral Bunnie DominoBH-q7a Peter Le, MD   81 mg at 02/26/16 0607  . aztreonam (AZACTAM)  1 g in dextrose 5 % 50 mL IVPB  1 g Intravenous Q8H Clydia Llano, MD   1 g at 02/26/16 0615  . benztropine (COGENTIN) tablet 0.5 mg  0.5 mg Oral BID Houston Siren, MD   0.5 mg at 02/26/16 0955  . bisacodyl (DULCOLAX) EC tablet 10 mg  10 mg Oral Q8H PRN Houston Siren, MD      . buPROPion (WELLBUTRIN XL) 24 hr tablet 300 mg  300 mg Oral Daily Houston Siren, MD   300 mg at 02/26/16 1000  . dextrose 5 %-0.9 % sodium chloride  infusion   Intravenous Continuous Houston Siren, MD 75 mL/hr at 02/25/16 2158    . donepezil (ARICEPT) tablet 5 mg  5 mg Oral QHS Houston Siren, MD   5 mg at 02/25/16 2146  . feeding supplement (PRO-STAT SUGAR FREE 64) liquid 30 mL  30 mL Oral BID Houston Siren, MD   30 mL at 02/26/16 0954  . heparin injection 5,000 Units  5,000 Units Subcutaneous Q8H Houston Siren, MD   5,000 Units at 02/26/16 0607  . ipratropium-albuterol (DUONEB) 0.5-2.5 (3) MG/3ML nebulizer solution 3 mL  3 mL Nebulization Q6H PRN Houston Siren, MD      . ketoconazole (NIZORAL) 2 % shampoo 1 application  1 application Topical Once per day on Mon Thu Houston Siren, MD   1 application at 02/26/16 0900  . lamoTRIgine (LAMICTAL) tablet 100 mg  100 mg Oral BID Houston Siren, MD   100 mg at 02/26/16 0956  . multivitamin with minerals tablet 1 tablet  1 tablet Oral Daily Houston Siren, MD   1 tablet at 02/26/16 0955  . ondansetron (ZOFRAN) tablet 4 mg  4 mg Oral Q6H PRN Houston Siren, MD       Or  . ondansetron [facility]) injection 4 mg  4 mg Intravenous Q6H PRN Houston Siren, MD      . ondansetron [facility]) tablet 4 mg  4 mg Oral Q8H PRN Houston Siren, MD      . oxybutynin (DITROPAN-XL) 24 hr tablet 10 mg  10 mg Oral Daily Houston Siren, MD   10 mg at 02/26/16 0955  . pantoprazole (PROTONIX) EC tablet 40 mg  40 mg Oral Daily Houston Siren, MD   40 mg at 02/26/16 0955  . PARoxetine (PAXIL) tablet 40 mg  40 mg Oral QHS Houston Siren, MD   40 mg at 02/25/16 2146  . polyethylene glycol (MIRALAX / GLYCOLAX) packet 17 g  17 g Oral Daily Houston Siren, MD   17 g at 02/26/16 1000  . risperiDONE (RISPERDAL) tablet 0.25 mg  0.25 mg Oral TID Houston Siren, MD   0.5 mg at 02/26/16 0856  . risperiDONE (RISPERDAL) tablet 1 mg  1 mg Oral QHS Houston Siren, MD   1 mg at 02/25/16 2146  . senna (SENOKOT) tablet 8.6 mg  1 tablet Oral BID Houston Siren, MD   8.6 mg at 02/26/16 0955  . terazosin (HYTRIN) capsule 2 mg  2 mg Oral QHS Houston Siren, MD   2 mg at 02/25/16 2146  . vancomycin (VANCOCIN) IVPB 1000 mg/200 mL premix  1,000 mg Intravenous  Q24H Clydia Llano, MD   1,000 mg at 02/26/16 1610     Discharge Medications: Please see discharge summary for a list of discharge medications.  Relevant Imaging Results:  Relevant Lab Results:   Additional Information    Karn Cassis, [place] [phone]

## 2016-02-26 NOTE — Evaluation (Signed)
Occupational Therapy Evaluation Patient Details Name: Amy BoundFrances Clay MRN: [id number] DOB: 04-22-1941 Today's Date: 02/26/2016    History of Present Illness Amy Clay is a 75 y.o. female with medical history significant of CKD stage 3, hx of dementia, hx of DVT, mood disorder, HTN, HLD, anxiety and depression presents to the ED via EMS with confusion. She makes grimaces and is very lethargic. Unable to obtain history due to patients condition.  I took care of her previously for similar presentation.  At that time, her anticoagulation was discontinued, and her code status was confirmed as DNR.   Clinical Impression   Pt in bed upon therapy arrival. Agreeable to participate in OT evaluation. Pt unable to provide any background information regarding prior level of function. Pt with a large skin tear on right calf. Nursing made aware and it was tended to prior to transfer to recliner. Pt required increased assistance of 2 people. Hoyer lift placed under patient while in recliner. Recommended patient return to [facility] at discharge to receive OT services.     Follow Up Recommendations  SNF    Equipment Recommendations  None recommended by OT       Precautions / Restrictions Precautions Precautions: Fall      Mobility Bed Mobility Overal bed mobility: Needs Assistance Bed Mobility: Supine to Sit     Supine to sit: Total assist        Transfers Overall transfer level: Needs assistance Equipment used: None Transfers: Stand Pivot Transfers   Stand pivot transfers: +2 physical assistance       General transfer comment: Due to patient's need for increased physical assist during transfers patient will require a hoyer lift for transfers for safety.    Balance Overall balance assessment: Needs assistance Sitting-balance support: Single extremity supported   Sitting balance - Comments: Pt required max assist to maintain sitting balance. Postural control: Posterior  lean;Right lateral lean                                  ADL Overall ADL's : Needs assistance/impaired Eating/Feeding: Set up;Sitting   Grooming: Wash/dry face;Set up;Bed level           Upper Body Dressing : Total assistance;Sitting   Lower Body Dressing: Total assistance;Bed level               Functional mobility during ADLs: +2 for physical assistance;Cueing for safety;Cueing for sequencing                 Pertinent Vitals/Pain Pain Assessment: No/denies pain     Hand Dominance     Extremity/Trunk Assessment Upper Extremity Assessment Upper Extremity Assessment: Generalized weakness   Lower Extremity Assessment Lower Extremity Assessment: Defer to PT evaluation       Communication Communication Communication: No difficulties   Cognition Arousal/Alertness: Awake/alert Behavior During Therapy: WFL for tasks assessed/performed Overall Cognitive Status: History of cognitive impairments - at baseline                                Home Living Family/patient expects to be discharged to:: Skilled nursing facility                                 Additional Comments: Patient is unable to give any concret background information regarding  prior level of function.      Prior Functioning/Environment Level of Independence: Needs assistance                 OT Problem List: Decreased strength;Impaired balance (sitting and/or standing)   OT Treatment/Interventions: Self-care/ADL training;Therapeutic exercise;Therapeutic activities;Patient/family education;DME and/or AE instruction    OT Goals(Current goals can be found in the care plan section) Acute Rehab OT Goals Patient Stated Goal: None stated OT Goal Formulation: With patient Time For Goal Achievement: 03/11/16 Potential to Achieve Goals: Good  OT Frequency: Min 2X/week    End of Session Equipment Utilized During Treatment: Gait belt Nurse  Communication: Mobility status  Activity Tolerance: Patient tolerated treatment well Patient left: in chair;with nursing/sitter in room   Time: 0840-0930 OT Time Calculation (min): 50 min Charges:  OT General Charges $OT Visit: 1 Procedure OT Evaluation $OT Eval Low Complexity: 1 Procedure G-Codes:    13-Mar-2016, 10:44 AM Limmie Patricia, OTR/L,CBIS  [phone]

## 2016-02-26 NOTE — Discharge Summary (Signed)
Physician Discharge Summary  Amy Clay ZOX:[id number] DOB: 03-18-1941 DOA: 02/23/2016  PCP: Colette Ribas, MD  Admit date: 02/23/2016 Discharge date: 02/26/2016  Admitted From: SNF Disposition: [facility]  Recommendations for Outpatient Follow-up:  1. Follow up with PCP in 1-2 weeks 2. Please obtain BMP/CBC in one week 3. Ceftin for 5 more days  Home Health: NA  Equipment/Devices: NA  Discharge Condition: Stable CODE STATUS: DNR/DNI Diet recommendation: Heart Healthy / Carb Modified  Brief/Interim Summary: Amy Clay a 75 y.o.femalewith medical history significant of CKD stage 3, hx of dementia, hx of DVT, mood disorder, HTN, HLD, anxiety and depression presents to the ED via EMS with confusion. She makes grimaces and is very lethargic. Unable to obtain history due to patients condition. I took care of her previously for similar presentation. At that time, her anticoagulation was discontinued, and her code status was confirmed as DNR.   Discharge Diagnoses:  Principal Problem:   Altered mental status Active Problems:   History of cardiovascular disorder   Dementia   Benign essential HTN   Lower urinary tract infectious disease   Pressure injury of skin   UTI -Urinalysis consistent with UTI, started on antibiotics.. -Urine culture obtained, patient started on aztreonam and vancomycin. -Allergic to penicillins, ciprofloxacin and cephalexin. Last urine culture showed enterococcus. -Culture showed GNR, but no identification. Discharge home on 5 more days of Ceftin. -She is listed that she is allergic to cephalexin, when she has more she described what appears to be oral fungal infection. -Monitor patient closely as she is on Ceftin.  Acute encephalopathy -Came in with confusion/lethargy on top of her dementia, likely secondary to UTI. -This is resolved, back to baseline.  Dementia -Patient is demented, continued home medications.  CKD stage III,  baseline creatinine 1.4 -Presented with mild AKI with creatinine 1.6, she is back to baseline, creatinine today is 135.  HTN -Controlled continue home medications.  Discharge Instructions  Discharge Instructions    Diet - low sodium heart healthy    Complete by:  As directed    Increase activity slowly    Complete by:  As directed        Medication List    TAKE these medications   acetaminophen 325 MG tablet Commonly known as:  TYLENOL Take 650 mg by mouth every 6 (six) hours as needed for mild pain or moderate pain.   aspirin EC 81 MG tablet Take 81 mg by mouth every morning.   benztropine 0.5 MG tablet Commonly known as:  COGENTIN Take 1 tablet (0.5 mg total) by mouth 2 (two) times daily.   buPROPion 300 MG 24 hr tablet Commonly known as:  WELLBUTRIN XL Take 1 tablet (300 mg total) by mouth every morning.   cefUROXime 500 MG tablet Commonly known as:  CEFTIN Take 1 tablet (500 mg total) by mouth 2 (two) times daily with a meal.   clonazePAM 0.5 MG tablet Commonly known as:  KLONOPIN Take daily at 5 pm What changed:  how much to take  how to take this  when to take this  additional instructions   donepezil 5 MG tablet Commonly known as:  ARICEPT Take 1 tablet (5 mg total) by mouth at bedtime.   DULCOLAX 5 MG EC tablet Generic drug:  bisacodyl Take 10 mg by mouth every 8 (eight) hours as needed for moderate constipation.   feeding supplement (PRO-STAT SUGAR FREE 64) Liqd Take 30 mLs by mouth 2 (two) times daily.   furosemide 20  MG tablet Commonly known as:  LASIX Take 20 mg by mouth every Monday, Wednesday, and Friday.   guaifenesin 400 MG Tabs tablet Commonly known as:  HUMIBID E Take 400 mg by mouth 2 (two) times daily.   ipratropium-albuterol 0.5-2.5 (3) MG/3ML Soln Commonly known as:  DUONEB Take 3 mLs by nebulization every 6 (six) hours as needed (for cough).   ketoconazole 2 % shampoo Commonly known as:  NIZORAL Apply 1 application  topically 2 (two) times a week. Every Sunday and Wednesday for dry scalp   lamoTRIgine 100 MG tablet Commonly known as:  LAMICTAL Take 1 tablet (100 mg total) by mouth 2 (two) times daily.   loratadine 10 MG tablet Commonly known as:  CLARITIN Take 10 mg by mouth daily.   multivitamin with minerals Tabs tablet Take 1 tablet by mouth daily.   omeprazole 20 MG capsule Commonly known as:  PRILOSEC Take 20 mg by mouth at bedtime.   ondansetron 4 MG tablet Commonly known as:  ZOFRAN Take 4 mg by mouth every 8 (eight) hours as needed for nausea or vomiting.   OS-CAL ULTRA 600 MG Tabs Take by mouth 2 (two) times daily.   oxybutynin 10 MG 24 hr tablet Commonly known as:  DITROPAN-XL Take 10 mg by mouth daily.   PARoxetine 40 MG tablet Commonly known as:  PAXIL Take 1 tablet (40 mg total) by mouth at bedtime.   polyethylene glycol packet Commonly known as:  MIRALAX / GLYCOLAX Take 17 g by mouth daily.   risperiDONE 0.25 MG tablet Commonly known as:  RISPERDAL Take 1 tablet (0.25 mg total) by mouth 3 (three) times daily.   risperiDONE 1 MG tablet Commonly known as:  RISPERDAL Take 1 tablet (1 mg total) by mouth at bedtime.   senna 8.6 MG Tabs tablet Commonly known as:  SENOKOT Take 1 tablet by mouth 2 (two) times daily.   terazosin 2 MG capsule Commonly known as:  HYTRIN Take 2 mg by mouth at bedtime.   Vitamin D 2000 units Caps Take 1 capsule by mouth every morning.       Allergies  Allergen Reactions  . Benzodiazepines Other (See Comments)    Memory problems on Ativan, which goes away when she is off of it.   . Ciprofloxacin     Rash at site of injection with itching  . Cephalexin Rash and Other (See Comments)    Blistering of the mouth  . Penicillins Rash    Consultations:  None.   Procedures/Studies: Ct Head Wo Contrast  Result Date: 02/23/2016 CLINICAL DATA:  Patient is stents and rigid, altered mental status EXAM: CT HEAD WITHOUT CONTRAST  TECHNIQUE: Contiguous axial images were obtained from the base of the skull through the vertex without intravenous contrast. COMPARISON:  07/15/2015, 08/19/2014 FINDINGS: Brain: There is mild motion artifact. No large vessel territorial infarction. No hemorrhage. No mass effect. Stable degree of ventricular enlargement. Moderate atrophy. Moderate confluent white matter presumed small vessel disease. Prominent extra-axial CSF space along the left parietal convexity is unchanged allowing for differences in head positioning. Vascular: Calcifications present within the carotid arteries at the skullbase. No hyperdense vessels. Skull: Mastoid air cells are clear.  There is no fracture. Sinuses/Orbits: Orbits similar compared to prior with left lens implant. Paranasal sinuses are grossly clear. Other: None IMPRESSION: 1. No definite CT evidence for acute intracranial abnormality allowing for motion artifact. 2. Atrophy. Moderate periventricular presumed small vessel white matter disease. Electronically Signed   By: Selena Batten  Jake SamplesFujinaga M.D.   On: 02/23/2016 23:08   Dg Chest Port 1 View  Result Date: 02/23/2016 CLINICAL DATA:  Altered mental status EXAM: PORTABLE CHEST 1 VIEW COMPARISON:  Chest radiograph 07/17/2015 FINDINGS: There is shallow lung inflation. No focal airspace consolidation. There is right basilar atelectasis, increased from the prior study. No pneumothorax or sizable pleural effusion. No pulmonary edema. Unchanged cardiomediastinal contours. IMPRESSION: Shallow lung inflation.  No focal consolidation or pulmonary edema. Electronically Signed   By: Deatra RobinsonKevin  Herman M.D.   On: 02/23/2016 22:50    (Echo, Carotid, EGD, Colonoscopy, ERCP)    Subjective:   Discharge Exam: Vitals:   02/25/16 2126 02/26/16 0530  BP: 132/90 125/77  Pulse: 60 74  Resp: 17 17  Temp: 97.8 F (36.6 C) 97.9 F (36.6 C)   Vitals:   02/25/16 0624 02/25/16 1445 02/25/16 2126 02/26/16 0530  BP: (!) 113/52 (!) 96/47 132/90  125/77  Pulse: 60 62 60 74  Resp: 17 17 17 17   Temp: 97.7 F (36.5 C) 98 F (36.7 C) 97.8 F (36.6 C) 97.9 F (36.6 C)  TempSrc: Oral Oral Oral Oral  SpO2: 96% 98% 97% 99%  Weight:      Height:        General: Pt is alert, awake, not in acute distress Cardiovascular: RRR, S1/S2 +, no rubs, no gallops Respiratory: CTA bilaterally, no wheezing, no rhonchi Abdominal: Soft, NT, ND, bowel sounds + Extremities: no edema, no cyanosis    The results of significant diagnostics from this hospitalization (including imaging, microbiology, ancillary and laboratory) are listed below for reference.     Microbiology: Recent Results (from the past 240 hour(s))  Urine culture     Status: Abnormal (Preliminary result)   Collection Time: 02/23/16 10:25 PM  Result Value Ref Range Status   Specimen Description URINE, CATHETERIZED  Final   Special Requests NONE  Final   Culture >=100,000 COLONIES/mL GRAM NEGATIVE RODS (A)  Final   Report Status PENDING  Incomplete  MRSA PCR Screening     Status: None   Collection Time: 02/24/16  1:50 AM  Result Value Ref Range Status   MRSA by PCR NEGATIVE NEGATIVE Final    Comment:        The GeneXpert MRSA Assay (FDA approved for NASAL specimens only), is one component of a comprehensive MRSA colonization surveillance program. It is not intended to diagnose MRSA infection nor to guide or monitor treatment for MRSA infections.      Labs: BNP (last 3 results) No results for input(s): BNP in the last 8760 hours. Basic Metabolic Panel:  Recent Labs Lab 02/23/16 2248 02/24/16 0145 02/24/16 0544 02/25/16 0644 02/26/16 0616  NA 136  --  140 139 140  K 4.5  --  4.3 4.1 4.1  CL 103  --  105 109 110  CO2 28  --  31 27 26   GLUCOSE 89  --  98 94 91  BUN 42*  --  42* 35* 34*  CREATININE 1.68* 1.61* 1.48* 1.31* 1.35*  CALCIUM 9.2  --  9.3 8.8* 8.8*   Liver Function Tests:  Recent Labs Lab 02/23/16 2248 02/24/16 0544  AST 18 14*  ALT 12*  11*  ALKPHOS 81 85  BILITOT 0.5 0.4  PROT 6.2* 5.8*  ALBUMIN 2.8* 2.7*   No results for input(s): LIPASE, AMYLASE in the last 168 hours. No results for input(s): AMMONIA in the last 168 hours. CBC:  Recent Labs Lab 02/23/16 2248 02/24/16 0145 02/24/16  1610 02/25/16 0644 02/26/16 0616  WBC 6.1 6.6 5.3 4.1 3.8*  NEUTROABS 3.8  --   --   --   --   HGB 10.5* 11.0* 10.3* 9.7* 9.4*  HCT 34.5* 35.6* 35.0* 31.6* 31.6*  MCV 100.0 100.3* 100.9* 100.3* 99.7  PLT 218 217 224 203 231   Cardiac Enzymes:  Recent Labs Lab 02/23/16 2248  TROPONINI <0.03   BNP: Invalid input(s): POCBNP CBG:  Recent Labs Lab 02/24/16 0734 02/24/16 1127 02/24/16 1622 02/24/16 2033 02/25/16 0712  GLUCAP 77 98 98 125* 96   D-Dimer No results for input(s): DDIMER in the last 72 hours. Hgb A1c No results for input(s): HGBA1C in the last 72 hours. Lipid Profile No results for input(s): CHOL, HDL, LDLCALC, TRIG, CHOLHDL, LDLDIRECT in the last 72 hours. Thyroid function studies  Recent Labs  02/24/16 0145  TSH 0.846   Anemia work up No results for input(s): VITAMINB12, FOLATE, FERRITIN, TIBC, IRON, RETICCTPCT in the last 72 hours. Urinalysis    Component Value Date/Time   COLORURINE YELLOW 02/23/2016 2225   APPEARANCEUR CLOUDY (A) 02/23/2016 2225   LABSPEC 1.020 02/23/2016 2225   PHURINE 6.0 02/23/2016 2225   GLUCOSEU NEGATIVE 02/23/2016 2225   HGBUR SMALL (A) 02/23/2016 2225   BILIRUBINUR NEGATIVE 02/23/2016 2225   KETONESUR NEGATIVE 02/23/2016 2225   PROTEINUR NEGATIVE 02/23/2016 2225   UROBILINOGEN 0.2 08/19/2014 0032   NITRITE NEGATIVE 02/23/2016 2225   LEUKOCYTESUR LARGE (A) 02/23/2016 2225   Sepsis Labs Invalid input(s): PROCALCITONIN,  WBC,  LACTICIDVEN Microbiology Recent Results (from the past 240 hour(s))  Urine culture     Status: Abnormal (Preliminary result)   Collection Time: 02/23/16 10:25 PM  Result Value Ref Range Status   Specimen Description URINE,  CATHETERIZED  Final   Special Requests NONE  Final   Culture >=100,000 COLONIES/mL GRAM NEGATIVE RODS (A)  Final   Report Status PENDING  Incomplete  MRSA PCR Screening     Status: None   Collection Time: 02/24/16  1:50 AM  Result Value Ref Range Status   MRSA by PCR NEGATIVE NEGATIVE Final    Comment:        The GeneXpert MRSA Assay (FDA approved for NASAL specimens only), is one component of a comprehensive MRSA colonization surveillance program. It is not intended to diagnose MRSA infection nor to guide or monitor treatment for MRSA infections.      Time coordinating discharge: Over 30 minutes  SIGNED:   Clint Lipps, MD  [facility] 02/26/2016, 11:15 AM Pager   If 7PM-7AM, please contact night-coverage [email or web address] Password [email or web address]

## 2016-02-26 NOTE — Care Management Important Message (Deleted)
Important Message  Patient Details  Name: Amy Clay MRN: [id number] Date of Birth: 1941-02-14   Medicare Important Message Given:  Yes    Malcolm MetroChildress, Sneha Willig Demske, RN 02/26/2016, 12:56 PM

## 2016-02-26 NOTE — Care Management Note (Signed)
Case Management Note  Patient Details  Name: Amy Clay MRN: [id number] Date of Birth: June 21, 1940  Subjective/Objective:                  Pt admitted with AMS from [facility]. Pt discharging today back to SNF. CSW is aware of DC and will make arrangements for return to facility.   Action/Plan: No CM needs.   Expected Discharge Date:    02/26/2016              Expected Discharge Plan:  Home/Self Care  In-House Referral:  Clinical Social Work  Discharge planning Services  CM Consult  Post Acute Care Choice:  NA Choice offered to:  NA  DME Arranged:    DME Agency:     HH Arranged:    HH Agency:     Status of Service:  Completed, signed off  Malcolm MetroChildress, Katora Fini Demske, RN 02/26/2016, 12:53 PM

## 2016-02-27 LAB — URINE CULTURE

## 2016-03-07 DIAGNOSIS — K219 Gastro-esophageal reflux disease without esophagitis: Secondary | ICD-10-CM | POA: Diagnosis not present

## 2016-03-07 DIAGNOSIS — D696 Thrombocytopenia, unspecified: Secondary | ICD-10-CM | POA: Diagnosis not present

## 2016-03-07 DIAGNOSIS — B370 Candidal stomatitis: Secondary | ICD-10-CM | POA: Diagnosis not present

## 2016-03-07 DIAGNOSIS — D649 Anemia, unspecified: Secondary | ICD-10-CM | POA: Diagnosis not present

## 2016-03-08 DIAGNOSIS — E46 Unspecified protein-calorie malnutrition: Secondary | ICD-10-CM | POA: Diagnosis not present

## 2016-03-08 DIAGNOSIS — R05 Cough: Secondary | ICD-10-CM | POA: Diagnosis not present

## 2016-03-08 DIAGNOSIS — K219 Gastro-esophageal reflux disease without esophagitis: Secondary | ICD-10-CM | POA: Diagnosis not present

## 2016-03-08 DIAGNOSIS — R093 Abnormal sputum: Secondary | ICD-10-CM | POA: Diagnosis not present

## 2016-03-11 DIAGNOSIS — R0981 Nasal congestion: Secondary | ICD-10-CM | POA: Diagnosis not present

## 2016-03-11 DIAGNOSIS — R093 Abnormal sputum: Secondary | ICD-10-CM | POA: Diagnosis not present

## 2016-03-13 DIAGNOSIS — E559 Vitamin D deficiency, unspecified: Secondary | ICD-10-CM | POA: Diagnosis not present

## 2016-03-13 DIAGNOSIS — R0789 Other chest pain: Secondary | ICD-10-CM | POA: Diagnosis not present

## 2016-03-13 DIAGNOSIS — K219 Gastro-esophageal reflux disease without esophagitis: Secondary | ICD-10-CM | POA: Diagnosis not present

## 2016-03-19 DIAGNOSIS — I10 Essential (primary) hypertension: Secondary | ICD-10-CM | POA: Diagnosis not present

## 2016-03-19 DIAGNOSIS — D72829 Elevated white blood cell count, unspecified: Secondary | ICD-10-CM | POA: Diagnosis not present

## 2016-03-21 DIAGNOSIS — L0291 Cutaneous abscess, unspecified: Secondary | ICD-10-CM | POA: Diagnosis not present

## 2016-03-21 DIAGNOSIS — N183 Chronic kidney disease, stage 3 (moderate): Secondary | ICD-10-CM | POA: Diagnosis not present

## 2016-03-21 DIAGNOSIS — D649 Anemia, unspecified: Secondary | ICD-10-CM | POA: Diagnosis not present

## 2016-03-26 DIAGNOSIS — N179 Acute kidney failure, unspecified: Secondary | ICD-10-CM | POA: Diagnosis not present

## 2016-04-02 DIAGNOSIS — D72829 Elevated white blood cell count, unspecified: Secondary | ICD-10-CM | POA: Diagnosis not present

## 2016-04-02 DIAGNOSIS — R7989 Other specified abnormal findings of blood chemistry: Secondary | ICD-10-CM | POA: Diagnosis not present

## 2016-04-09 DIAGNOSIS — N179 Acute kidney failure, unspecified: Secondary | ICD-10-CM | POA: Diagnosis not present

## 2016-04-09 DIAGNOSIS — R7989 Other specified abnormal findings of blood chemistry: Secondary | ICD-10-CM | POA: Diagnosis not present

## 2016-04-11 DIAGNOSIS — J9811 Atelectasis: Secondary | ICD-10-CM | POA: Diagnosis not present

## 2016-04-11 DIAGNOSIS — N183 Chronic kidney disease, stage 3 (moderate): Secondary | ICD-10-CM | POA: Diagnosis not present

## 2016-04-11 DIAGNOSIS — R062 Wheezing: Secondary | ICD-10-CM | POA: Diagnosis not present

## 2016-04-11 DIAGNOSIS — R05 Cough: Secondary | ICD-10-CM | POA: Diagnosis not present

## 2016-04-13 IMAGING — CR DG CHEST 2V
3 series · 3 of 3 positions shown · non-contrast
Comparison: 07/18/2013.

CLINICAL DATA: Headache.  Chest pain.

EXAM:
CHEST  2 VIEW

[view not recorded (1 of 3)]
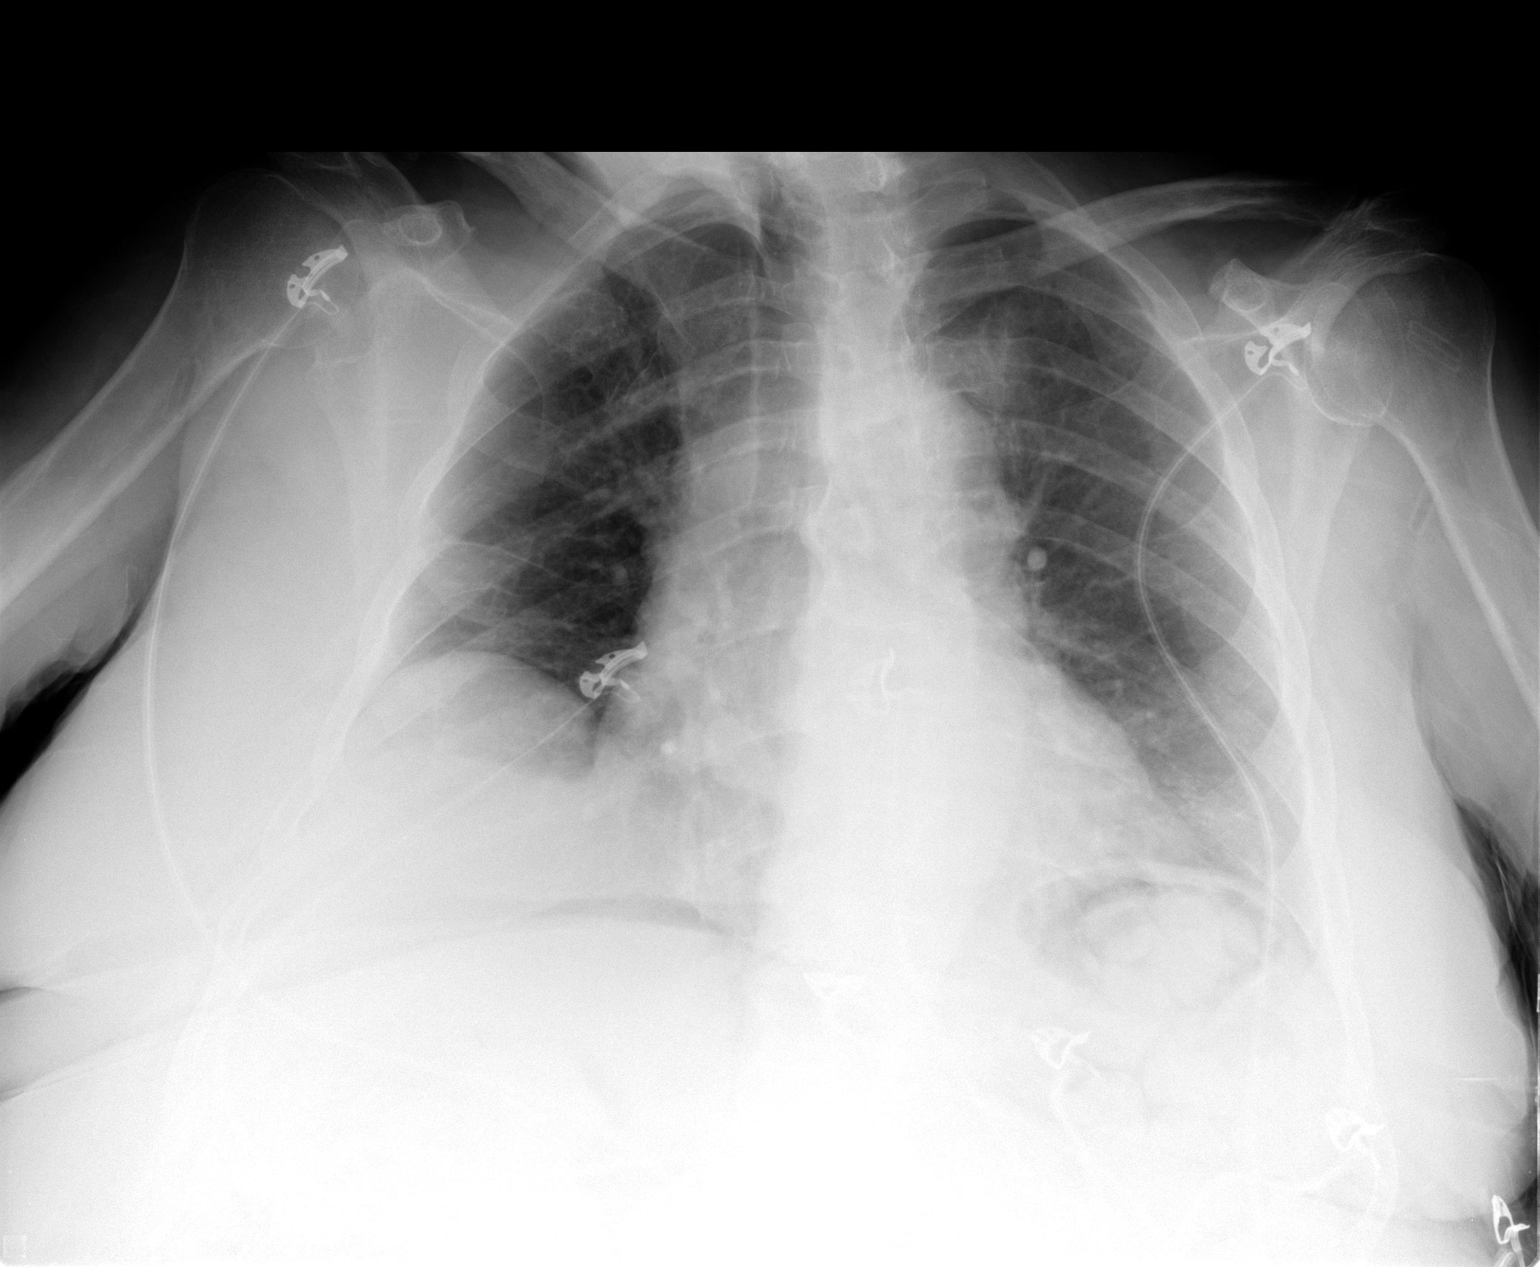

[view not recorded (2 of 3)]
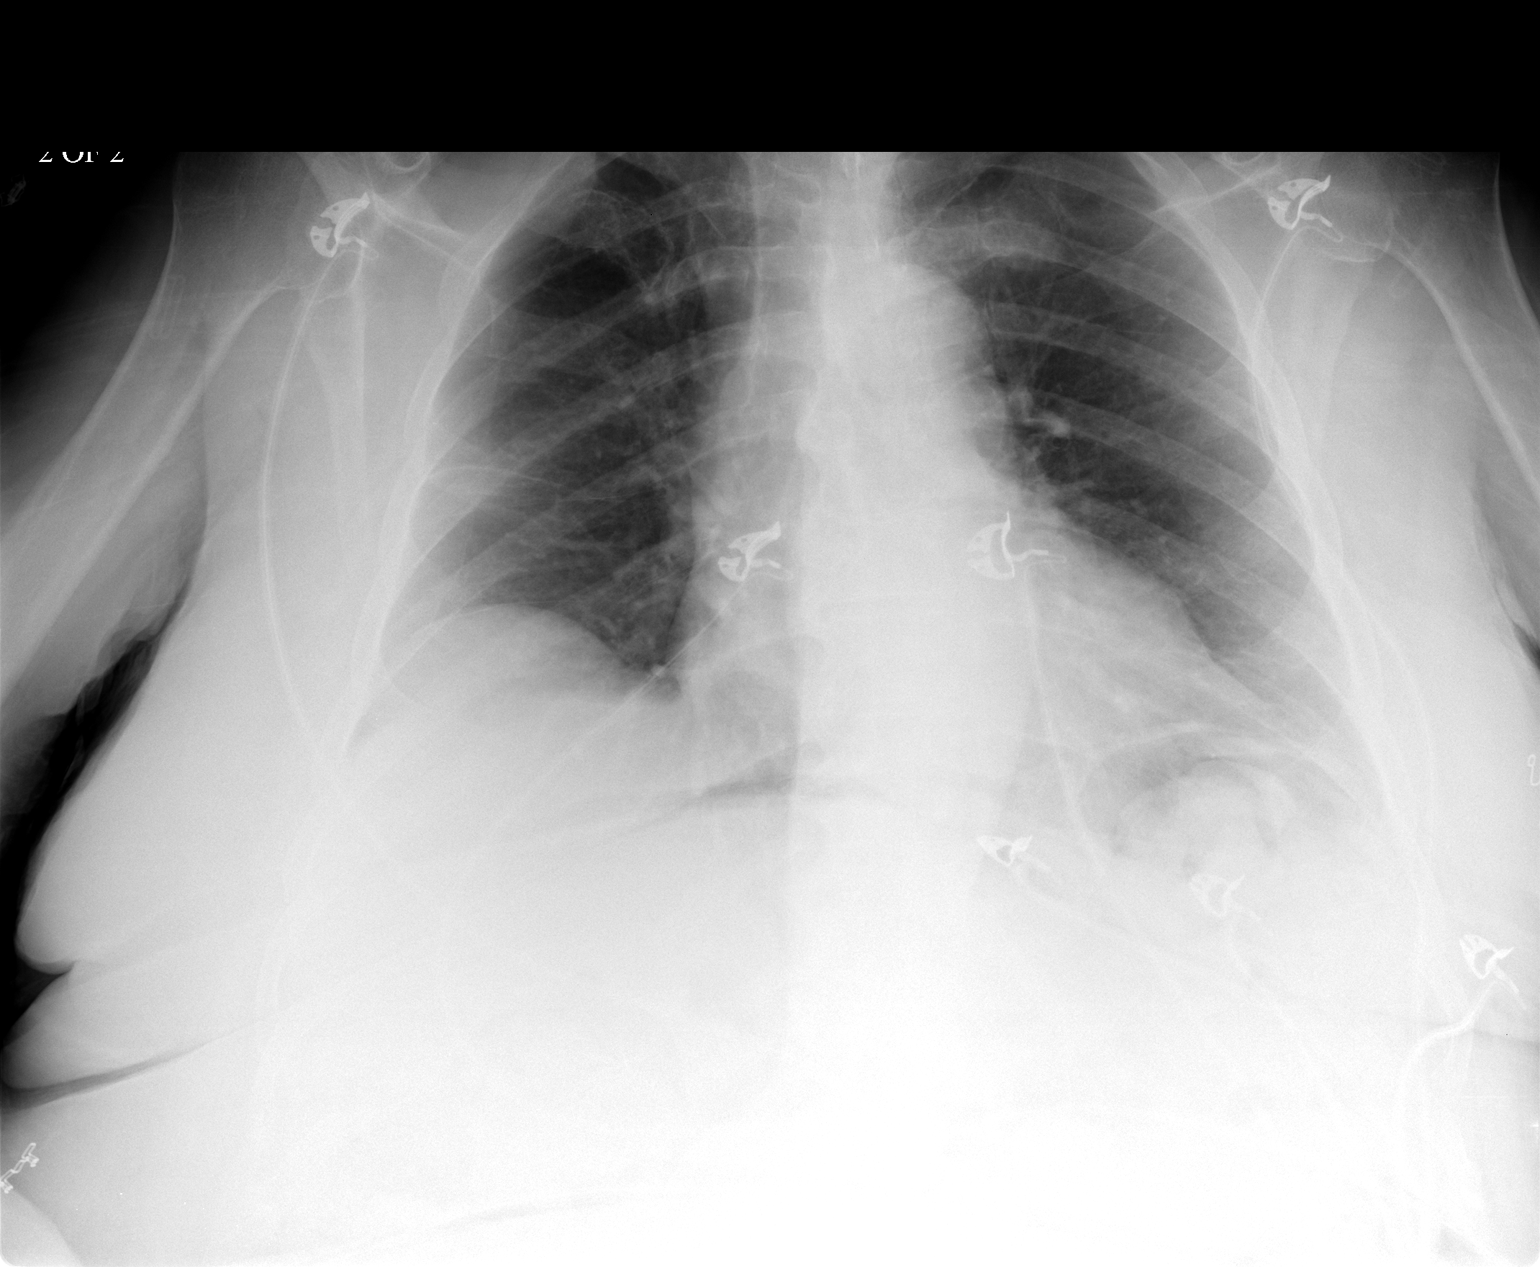

[view not recorded (3 of 3)]
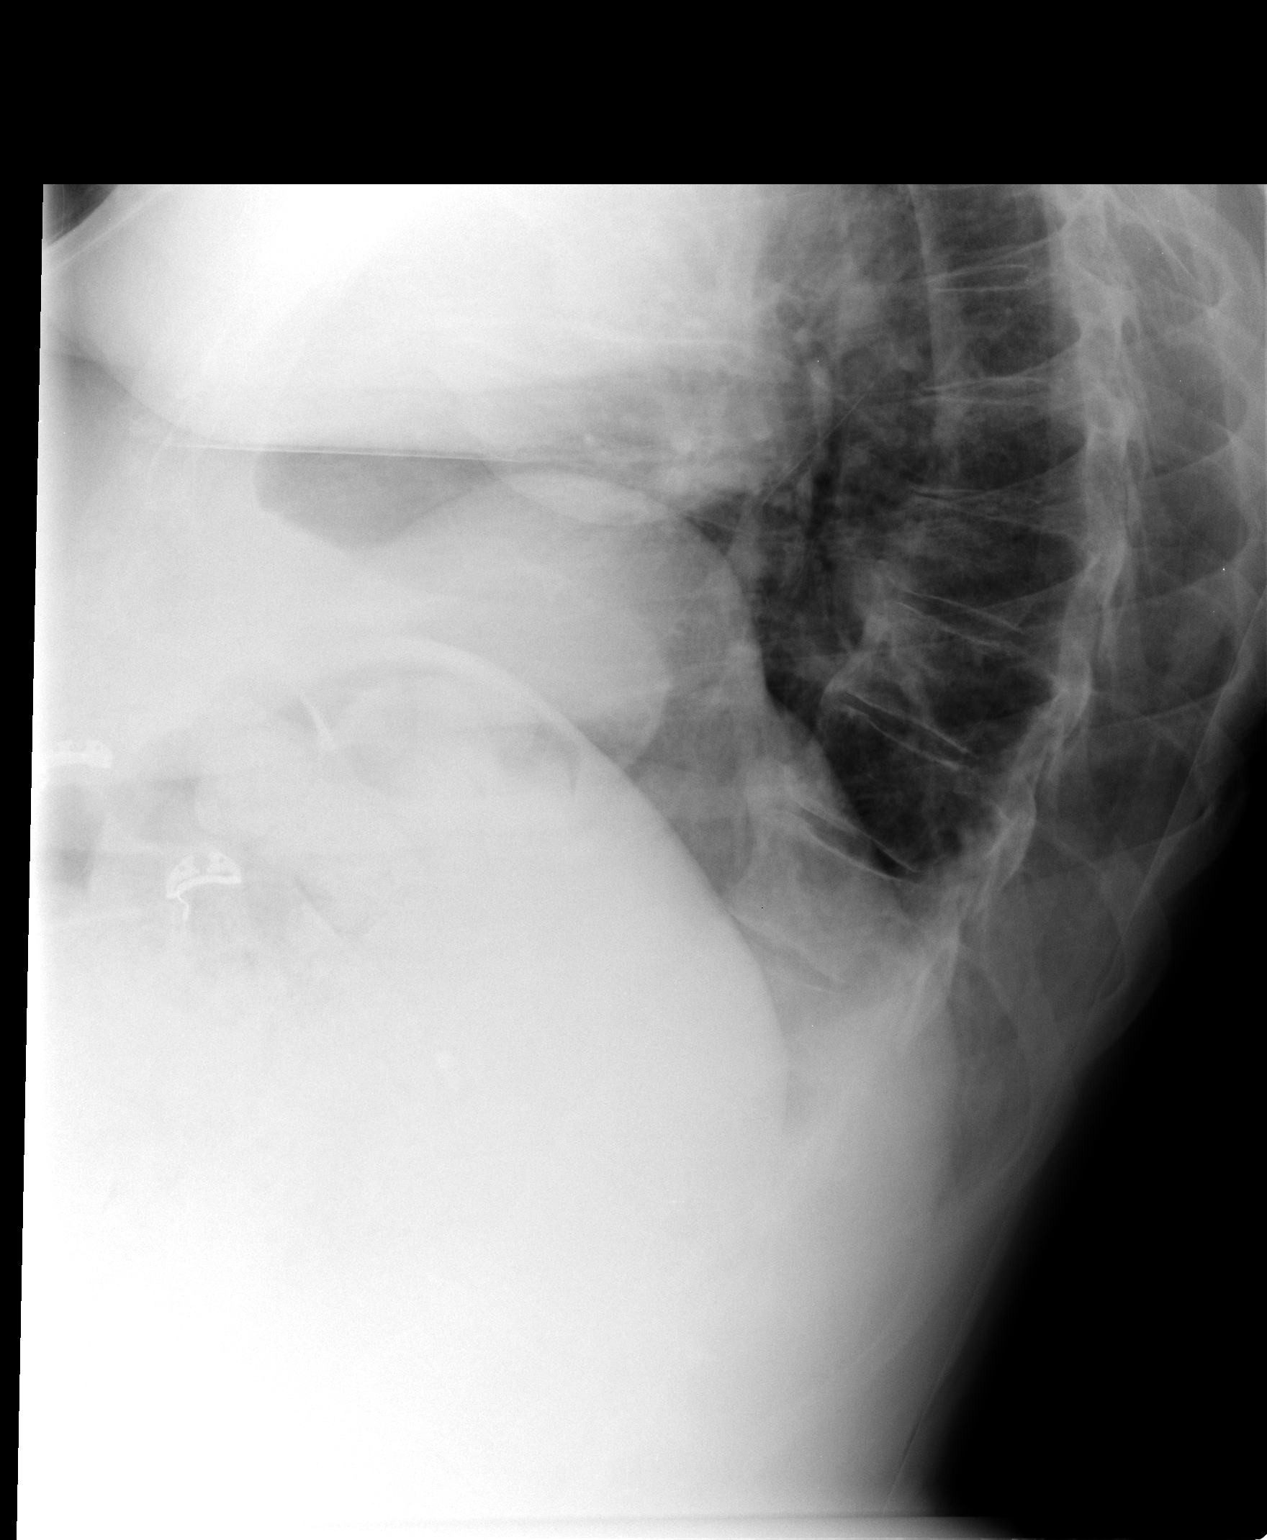

[3 of 3 positions shown; findings below may reference images not displayed]

FINDINGS: Mediastinum and hilar structures are normal . Prominent bilateral
first rib costochondral calcification. No change. Mild developing
infiltrate left lower lobe cannot be excluded. No pleural effusion
or pneumothorax. Cardiomegaly, no pulmonary venous congestion. No
acute bony abnormality.
IMPRESSION: Very mild developing infiltrate left lower lobe cannot be excluded.

## 2016-04-15 DIAGNOSIS — I517 Cardiomegaly: Secondary | ICD-10-CM | POA: Diagnosis not present

## 2016-04-15 DIAGNOSIS — J9811 Atelectasis: Secondary | ICD-10-CM | POA: Diagnosis not present

## 2016-04-15 DIAGNOSIS — R05 Cough: Secondary | ICD-10-CM | POA: Diagnosis not present

## 2016-04-15 DIAGNOSIS — R062 Wheezing: Secondary | ICD-10-CM | POA: Diagnosis not present

## 2016-04-16 DIAGNOSIS — N179 Acute kidney failure, unspecified: Secondary | ICD-10-CM | POA: Diagnosis not present

## 2016-04-16 DIAGNOSIS — R7989 Other specified abnormal findings of blood chemistry: Secondary | ICD-10-CM | POA: Diagnosis not present

## 2016-04-16 DIAGNOSIS — B351 Tinea unguium: Secondary | ICD-10-CM | POA: Diagnosis not present

## 2016-04-16 DIAGNOSIS — M79675 Pain in left toe(s): Secondary | ICD-10-CM | POA: Diagnosis not present

## 2016-04-16 DIAGNOSIS — M79674 Pain in right toe(s): Secondary | ICD-10-CM | POA: Diagnosis not present

## 2016-04-18 DIAGNOSIS — N183 Chronic kidney disease, stage 3 (moderate): Secondary | ICD-10-CM | POA: Diagnosis not present

## 2016-04-18 DIAGNOSIS — J9811 Atelectasis: Secondary | ICD-10-CM | POA: Diagnosis not present

## 2016-04-18 DIAGNOSIS — R05 Cough: Secondary | ICD-10-CM | POA: Diagnosis not present

## 2016-04-18 DIAGNOSIS — R062 Wheezing: Secondary | ICD-10-CM | POA: Diagnosis not present

## 2016-04-22 ENCOUNTER — Telehealth (HOSPITAL_COMMUNITY): Payer: Self-pay | Admitting: *Deleted

## 2016-04-22 ENCOUNTER — Encounter (HOSPITAL_COMMUNITY): Payer: Self-pay

## 2016-04-22 ENCOUNTER — Ambulatory Visit (HOSPITAL_COMMUNITY): Payer: Self-pay | Admitting: Psychiatry

## 2016-04-22 NOTE — Telephone Encounter (Signed)
VOICE MESSAGE FROM [facility] [place] TO CANCEL MONDAY, NO ONE TO COME WITH HER TO APPOINTMENT.

## 2016-04-22 NOTE — Telephone Encounter (Signed)
Returned phone call regarding an appointment, no answer, no voice mail option.

## 2016-04-23 DIAGNOSIS — N179 Acute kidney failure, unspecified: Secondary | ICD-10-CM | POA: Diagnosis not present

## 2016-04-23 DIAGNOSIS — D72829 Elevated white blood cell count, unspecified: Secondary | ICD-10-CM | POA: Diagnosis not present

## 2016-04-25 ENCOUNTER — Ambulatory Visit (HOSPITAL_COMMUNITY): Payer: Self-pay | Admitting: Psychiatry

## 2016-05-01 DIAGNOSIS — I10 Essential (primary) hypertension: Secondary | ICD-10-CM | POA: Diagnosis not present

## 2016-05-01 DIAGNOSIS — R6889 Other general symptoms and signs: Secondary | ICD-10-CM | POA: Diagnosis not present

## 2016-05-09 DIAGNOSIS — N183 Chronic kidney disease, stage 3 (moderate): Secondary | ICD-10-CM | POA: Diagnosis not present

## 2016-05-09 DIAGNOSIS — I10 Essential (primary) hypertension: Secondary | ICD-10-CM | POA: Diagnosis not present

## 2016-05-09 DIAGNOSIS — D649 Anemia, unspecified: Secondary | ICD-10-CM | POA: Diagnosis not present

## 2016-05-14 DIAGNOSIS — I10 Essential (primary) hypertension: Secondary | ICD-10-CM | POA: Diagnosis not present

## 2016-05-14 DIAGNOSIS — N183 Chronic kidney disease, stage 3 (moderate): Secondary | ICD-10-CM | POA: Diagnosis not present

## 2016-05-15 DIAGNOSIS — D72829 Elevated white blood cell count, unspecified: Secondary | ICD-10-CM | POA: Diagnosis not present

## 2016-05-15 DIAGNOSIS — D649 Anemia, unspecified: Secondary | ICD-10-CM | POA: Diagnosis not present

## 2016-05-15 DIAGNOSIS — D696 Thrombocytopenia, unspecified: Secondary | ICD-10-CM | POA: Diagnosis not present

## 2016-05-15 DIAGNOSIS — I10 Essential (primary) hypertension: Secondary | ICD-10-CM | POA: Diagnosis not present

## 2016-05-22 DIAGNOSIS — I10 Essential (primary) hypertension: Secondary | ICD-10-CM | POA: Diagnosis not present

## 2016-05-22 DIAGNOSIS — D649 Anemia, unspecified: Secondary | ICD-10-CM | POA: Diagnosis not present

## 2016-05-22 DIAGNOSIS — N179 Acute kidney failure, unspecified: Secondary | ICD-10-CM | POA: Diagnosis not present

## 2016-05-27 DIAGNOSIS — N179 Acute kidney failure, unspecified: Secondary | ICD-10-CM | POA: Diagnosis not present

## 2016-05-27 DIAGNOSIS — D649 Anemia, unspecified: Secondary | ICD-10-CM | POA: Diagnosis not present

## 2016-05-27 DIAGNOSIS — R6889 Other general symptoms and signs: Secondary | ICD-10-CM | POA: Diagnosis not present

## 2016-05-27 DIAGNOSIS — R627 Adult failure to thrive: Secondary | ICD-10-CM | POA: Diagnosis not present

## 2016-05-27 DIAGNOSIS — R319 Hematuria, unspecified: Secondary | ICD-10-CM | POA: Diagnosis not present

## 2016-05-27 DIAGNOSIS — Z79899 Other long term (current) drug therapy: Secondary | ICD-10-CM | POA: Diagnosis not present

## 2016-05-27 DIAGNOSIS — N390 Urinary tract infection, site not specified: Secondary | ICD-10-CM | POA: Diagnosis not present

## 2016-05-29 ENCOUNTER — Ambulatory Visit (HOSPITAL_COMMUNITY): Payer: Self-pay | Admitting: Psychiatry

## 2016-05-30 DIAGNOSIS — N183 Chronic kidney disease, stage 3 (moderate): Secondary | ICD-10-CM | POA: Diagnosis not present

## 2016-05-30 DIAGNOSIS — N390 Urinary tract infection, site not specified: Secondary | ICD-10-CM | POA: Diagnosis not present

## 2016-06-03 DIAGNOSIS — N183 Chronic kidney disease, stage 3 (moderate): Secondary | ICD-10-CM | POA: Diagnosis not present

## 2016-06-03 DIAGNOSIS — R6889 Other general symptoms and signs: Secondary | ICD-10-CM | POA: Diagnosis not present

## 2016-06-03 DIAGNOSIS — I82421 Acute embolism and thrombosis of right iliac vein: Secondary | ICD-10-CM | POA: Diagnosis not present

## 2016-06-03 DIAGNOSIS — N390 Urinary tract infection, site not specified: Secondary | ICD-10-CM | POA: Diagnosis not present

## 2016-06-03 DIAGNOSIS — R7989 Other specified abnormal findings of blood chemistry: Secondary | ICD-10-CM | POA: Diagnosis not present

## 2016-06-03 DIAGNOSIS — M79606 Pain in leg, unspecified: Secondary | ICD-10-CM | POA: Diagnosis not present

## 2016-06-05 DIAGNOSIS — Z7901 Long term (current) use of anticoagulants: Secondary | ICD-10-CM | POA: Diagnosis not present

## 2016-06-05 DIAGNOSIS — I4891 Unspecified atrial fibrillation: Secondary | ICD-10-CM | POA: Diagnosis not present

## 2016-06-06 DIAGNOSIS — I82409 Acute embolism and thrombosis of unspecified deep veins of unspecified lower extremity: Secondary | ICD-10-CM | POA: Diagnosis not present

## 2016-06-06 DIAGNOSIS — M79606 Pain in leg, unspecified: Secondary | ICD-10-CM | POA: Diagnosis not present

## 2016-06-06 DIAGNOSIS — N390 Urinary tract infection, site not specified: Secondary | ICD-10-CM | POA: Diagnosis not present

## 2016-06-10 DIAGNOSIS — N179 Acute kidney failure, unspecified: Secondary | ICD-10-CM | POA: Diagnosis not present

## 2016-06-10 DIAGNOSIS — I10 Essential (primary) hypertension: Secondary | ICD-10-CM | POA: Diagnosis not present

## 2016-06-10 DIAGNOSIS — Z7901 Long term (current) use of anticoagulants: Secondary | ICD-10-CM | POA: Diagnosis not present

## 2016-06-10 DIAGNOSIS — I4891 Unspecified atrial fibrillation: Secondary | ICD-10-CM | POA: Diagnosis not present

## 2016-06-10 DIAGNOSIS — E162 Hypoglycemia, unspecified: Secondary | ICD-10-CM | POA: Diagnosis not present

## 2016-06-10 DIAGNOSIS — I82409 Acute embolism and thrombosis of unspecified deep veins of unspecified lower extremity: Secondary | ICD-10-CM | POA: Diagnosis not present

## 2016-06-10 DIAGNOSIS — M79606 Pain in leg, unspecified: Secondary | ICD-10-CM | POA: Diagnosis not present

## 2016-06-11 ENCOUNTER — Ambulatory Visit (INDEPENDENT_AMBULATORY_CARE_PROVIDER_SITE_OTHER): Payer: Medicare Other | Admitting: Psychiatry

## 2016-06-11 ENCOUNTER — Encounter (HOSPITAL_COMMUNITY): Payer: Self-pay | Admitting: Psychiatry

## 2016-06-11 VITALS — BP 93/62 | HR 92 | Ht 66.0 in

## 2016-06-11 DIAGNOSIS — F0391 Unspecified dementia with behavioral disturbance: Secondary | ICD-10-CM | POA: Diagnosis not present

## 2016-06-11 DIAGNOSIS — F419 Anxiety disorder, unspecified: Secondary | ICD-10-CM

## 2016-06-11 DIAGNOSIS — F331 Major depressive disorder, recurrent, moderate: Secondary | ICD-10-CM | POA: Diagnosis not present

## 2016-06-11 MED ORDER — RISPERIDONE 1 MG PO TABS: 1.0000 mg | ORAL_TABLET | Freq: Every day | ORAL | 3 refills | Status: DC

## 2016-06-11 MED ORDER — CLONAZEPAM 0.5 MG PO TABS: ORAL_TABLET | ORAL | 3 refills | Status: DC

## 2016-06-11 MED ORDER — BENZTROPINE MESYLATE 0.5 MG PO TABS: 0.5000 mg | ORAL_TABLET | Freq: Two times a day (BID) | ORAL | 3 refills | Status: AC

## 2016-06-11 MED ORDER — LAMOTRIGINE 100 MG PO TABS: 100.0000 mg | ORAL_TABLET | Freq: Two times a day (BID) | ORAL | 2 refills | Status: DC

## 2016-06-11 MED ORDER — DONEPEZIL HCL 5 MG PO TABS: 5.0000 mg | ORAL_TABLET | Freq: Every day | ORAL | 2 refills | Status: AC

## 2016-06-11 MED ORDER — BUPROPION HCL ER (XL) 300 MG PO TB24: 300.0000 mg | ORAL_TABLET | ORAL | 3 refills | Status: DC

## 2016-06-11 MED ORDER — PAROXETINE HCL 40 MG PO TABS: 40.0000 mg | ORAL_TABLET | Freq: Every day | ORAL | 3 refills | Status: DC

## 2016-06-11 MED ORDER — RISPERIDONE 0.25 MG PO TABS: 0.2500 mg | ORAL_TABLET | Freq: Three times a day (TID) | ORAL | 3 refills | Status: DC

## 2016-06-11 NOTE — Progress Notes (Signed)
Patient ID: Sarika Baldini, female   DOB: 1940-08-06, 76 y.o.   MRN: [id number] Patient ID: Deannah Rossi, female   DOB: July 26, 1940, 76 y.o.   MRN: [id number] Patient ID: Kosisochukwu Goldberg, female   DOB: 1941-05-08, 76 y.o.   MRN: [id number] Patient ID: Nathalie Cavendish, female   DOB: [birth year], 76 y.o.   MRN: [id number] Patient ID: Duana Benedict, female   DOB: Aug 14, 1940, 76 y.o.   MRN: [id number] Patient ID: Laquinta Hazell, female   DOB: 1941/03/03, 76 y.o.   MRN: [id number] Patient ID: Franchon Ketterman, female   DOB: March 12, 1941, 76 y.o.   MRN: [id number] Patient ID: Matalynn Graff, female   DOB: [birth year], 76 y.o.   MRN: [id number] Patient ID: Milagro Belmares, female   DOB: March 27, 1941, 76 y.o.   MRN: [id number] Patient ID: Sophiya Morello, female   DOB: 08/30/1940, 76 y.o.   MRN: [id number] Patient ID: Jacquie Lukes, female   DOB: 11/10/1940, 76 y.o.   MRN: [id number] Patient ID: Samanta Gal, female   DOB: [birth year], 76 y.o.   MRN: [id number] Patient ID: Nicle Connole, female   DOB: [birth year], 76 y.o.   MRN: [id number] Patient ID: Laquitha Heslin, female   DOB: [birth year], 76 y.o.   MRN: [id number] [facility] [id number] Progress Note Junell Cullifer MRN: [id number] DOB: Dec 01, 1940 Age: 76 y.o.  Date: 06/11/2016  Chief Complaint  Patient presents with  . Depression  . Memory Loss  . Anxiety  . Follow-up   History of presenting illness Patient is 76 year old Caucasian female who came with her sister for her followup appointment. She lives in the [place] assisted care facility. She and her sister had been living together in the family farm outside of [place]. Neither one has ever been married or have had children. The patient worked until the early 80s when she began to have difficulties with her nerves.  Currently the patient is still struggling with her mental and physical health. She was admitted last year to a psychiatric hospital in [place] because she was anxious nervous and  depressed. Last time Dr. Lolly Mustache increased her Risperdal which is helped somewhat. She can't walk and it's not clear why much of it seems to be anxiety based and she seems to have a fear of falling. She's also recently developed possible renal failure and is can be referred to a nephrologist. She was on lithium for a number of years. We do not have her  recent records from her primary care physician who is outside of [place]  The patient returns after 4 months. She is here with her nursing assistant Lupita Leash. She states that she cannot talk today. She is in her wheelchair and has not totally wheelchair or bed bound. Lupita Leash states that she does participate in activities and is eating all her meals. Her weight on the nursing home chart is 176 which is not changed much since March. She's not crying out at the nursing home are asking for help all the time like she use to. She seems to be calmer but on the other hand she is more confused and disoriented.  Past psychiatric history Patient is seeing psychiatrist since 1997 in this office.  Her last psychiatric admission was at [facility] after having suicidal thoughts.  She denies any history of suicidal attempt however endorse history of hopeless feeling.  She is diagnosed with major depressive disorder. She she was very stable on lithium until her creatinine went up and lithium was discontinued.  We had recently tried Lamictal and  the dose has been increased recently.  In the past we had tried BuSpar which was discontinued at hospital.  She has another psychiatric inpatient treatment in 90s. She admitted history of mania, depression and psychosis.  Social history Patient lives in a nursing home.  She has no children.  Her husband died in nursing home.  Patient has no other family other than a sister who visits when she can  Medical history Patient was recently admitted due to dehydration and UTI.  Patient has history of arthritis blood  pressure and chronic pain. Her primary care physician is the provider for the nursing home  She takes medication for her blood pressure and arthritis.  She had history of jerking movements, fall and generalized pain. Her last blood work was done on 02/14/2012 which shows anemia, her creatinine was 1.33.  She was given antibiotic result UTI.  Her WBC count was 10.4.  Family History family history includes Anxiety disorder in her paternal aunt and paternal uncle; Bipolar disorder in her cousin.  ROS   Mental status examination Patient is casually dressed and fairly groomed. She is in a wheelchair. Her feet are in pressure boots   She described her mood as okay Her voice is high-pitched and soft.  .Her affect is also anxious Her attention and concentration are poor today  She said very little today  She doesn't have auditory or visual hallucination.  There were no paranoia or delusion obsession present at this time.  Her fund of knowledge is poor. Her orientation is to person and situation  There are no tremors and shakes in her hand.  She's alert  Her insight judgment are poor and impulse control is okay.  Lab Results:  Results for orders placed or performed during the hospital encounter of 02/23/16 (from the past 8736 hour(s))  Urine culture   Collection Time: 02/23/16 10:25 PM  Result Value Ref Range   Specimen Description URINE, CATHETERIZED    Special Requests NONE    Culture >=100,000 COLONIES/mL ESCHERICHIA COLI (A)    Report Status 02/27/2016 FINAL    Organism ID, Bacteria ESCHERICHIA COLI (A)       Susceptibility   Escherichia coli - MIC*    AMPICILLIN >=32 RESISTANT Resistant     CEFAZOLIN <=4 SENSITIVE Sensitive     CEFTRIAXONE <=1 SENSITIVE Sensitive     CIPROFLOXACIN <=0.25 SENSITIVE Sensitive     GENTAMICIN <=1 SENSITIVE Sensitive     IMIPENEM <=0.25 SENSITIVE Sensitive     NITROFURANTOIN <=16 SENSITIVE Sensitive     TRIMETH/SULFA <=20 SENSITIVE Sensitive      AMPICILLIN/SULBACTAM >=32 RESISTANT Resistant     PIP/TAZO <=4 SENSITIVE Sensitive     Extended ESBL NEGATIVE Sensitive     * >=100,000 COLONIES/mL ESCHERICHIA COLI  Urinalysis, Routine w reflex microscopic   Collection Time: 02/23/16 10:25 PM  Result Value Ref Range   Color, Urine YELLOW YELLOW   APPearance CLOUDY (A) CLEAR   Specific Gravity, Urine 1.020 1.005 - 1.030   pH 6.0 5.0 - 8.0   Glucose, UA NEGATIVE NEGATIVE mg/dL   Hgb urine dipstick SMALL (A) NEGATIVE   Bilirubin Urine NEGATIVE NEGATIVE   Ketones, ur NEGATIVE NEGATIVE mg/dL   Protein, ur NEGATIVE NEGATIVE mg/dL   Nitrite NEGATIVE NEGATIVE   Leukocytes, UA LARGE (A) NEGATIVE  Urine microscopic-add on   Collection Time: 02/23/16 10:25 PM  Result Value Ref Range   Squamous Epithelial / LPF NONE SEEN NONE SEEN   WBC, UA TOO NUMEROUS  TO COUNT 0 - 5 WBC/hpf   RBC / HPF 6-30 0 - 5 RBC/hpf   Bacteria, UA MANY (A) NONE SEEN  Comprehensive metabolic panel   Collection Time: 02/23/16 10:48 PM  Result Value Ref Range   Sodium 136 135 - 145 mmol/L   Potassium 4.5 3.5 - 5.1 mmol/L   Chloride 103 101 - 111 mmol/L   CO2 28 22 - 32 mmol/L   Glucose, Bld 89 65 - 99 mg/dL   BUN 42 (H) 6 - 20 mg/dL   Creatinine, Ser 1.61 (H) 0.44 - 1.00 mg/dL   Calcium 9.2 8.9 - 09.6 mg/dL   Total Protein 6.2 (L) 6.5 - 8.1 g/dL   Albumin 2.8 (L) 3.5 - 5.0 g/dL   AST 18 15 - 41 U/L   ALT 12 (L) 14 - 54 U/L   Alkaline Phosphatase 81 38 - 126 U/L   Total Bilirubin 0.5 0.3 - 1.2 mg/dL   GFR calc non Af Amer 28 (L) >60 mL/min   GFR calc Af Amer 33 (L) >60 mL/min   Anion gap 5 5 - 15  Troponin I   Collection Time: 02/23/16 10:48 PM  Result Value Ref Range   Troponin I <0.03 <0.03 ng/mL  Lactic acid, plasma   Collection Time: 02/23/16 10:48 PM  Result Value Ref Range   Lactic Acid, Venous 1.1 0.5 - 1.9 mmol/L  CBC with Differential   Collection Time: 02/23/16 10:48 PM  Result Value Ref Range   WBC 6.1 4.0 - 10.5 K/uL   RBC 3.45 (L) 3.87 -  5.11 MIL/uL   Hemoglobin 10.5 (L) 12.0 - 15.0 g/dL   HCT 04.5 (L) 40.9 - 81.1 %   MCV 100.0 78.0 - 100.0 fL   MCH 30.4 26.0 - 34.0 pg   MCHC 30.4 30.0 - 36.0 g/dL   RDW 91.4 (H) 78.2 - 95.6 %   Platelets 218 150 - 400 K/uL   Neutrophils Relative % 61 %   Neutro Abs 3.8 1.7 - 7.7 K/uL   Lymphocytes Relative 23 %   Lymphs Abs 1.4 0.7 - 4.0 K/uL   Monocytes Relative 10 %   Monocytes Absolute 0.6 0.1 - 1.0 K/uL   Eosinophils Relative 6 %   Eosinophils Absolute 0.4 0.0 - 0.7 K/uL   Basophils Relative 0 %   Basophils Absolute 0.0 0.0 - 0.1 K/uL  Protime-INR   Collection Time: 02/23/16 10:48 PM  Result Value Ref Range   Prothrombin Time 12.6 11.4 - 15.2 seconds   INR 0.94   Lactic acid, plasma   Collection Time: 02/24/16  1:27 AM  Result Value Ref Range   Lactic Acid, Venous 1.3 0.5 - 1.9 mmol/L  CBC   Collection Time: 02/24/16  1:45 AM  Result Value Ref Range   WBC 6.6 4.0 - 10.5 K/uL   RBC 3.55 (L) 3.87 - 5.11 MIL/uL   Hemoglobin 11.0 (L) 12.0 - 15.0 g/dL   HCT 21.3 (L) 08.6 - 57.8 %   MCV 100.3 (H) 78.0 - 100.0 fL   MCH 31.0 26.0 - 34.0 pg   MCHC 30.9 30.0 - 36.0 g/dL   RDW 46.9 (H) 62.9 - 52.8 %   Platelets 217 150 - 400 K/uL  Creatinine, serum   Collection Time: 02/24/16  1:45 AM  Result Value Ref Range   Creatinine, Ser 1.61 (H) 0.44 - 1.00 mg/dL   GFR calc non Af Amer 30 (L) >60 mL/min   GFR calc Af Amer 35 (L) >  60 mL/min  TSH   Collection Time: 02/24/16  1:45 AM  Result Value Ref Range   TSH 0.846 0.350 - 4.500 uIU/mL  MRSA PCR Screening   Collection Time: 02/24/16  1:50 AM  Result Value Ref Range   MRSA by PCR NEGATIVE NEGATIVE  Glucose, capillary   Collection Time: 02/24/16  4:35 AM  Result Value Ref Range   Glucose-Capillary 90 65 - 99 mg/dL   Comment 1 Notify RN   Comprehensive metabolic panel   Collection Time: 02/24/16  5:44 AM  Result Value Ref Range   Sodium 140 135 - 145 mmol/L   Potassium 4.3 3.5 - 5.1 mmol/L   Chloride 105 101 - 111 mmol/L    CO2 31 22 - 32 mmol/L   Glucose, Bld 98 65 - 99 mg/dL   BUN 42 (H) 6 - 20 mg/dL   Creatinine, Ser 4.09 (H) 0.44 - 1.00 mg/dL   Calcium 9.3 8.9 - 81.1 mg/dL   Total Protein 5.8 (L) 6.5 - 8.1 g/dL   Albumin 2.7 (L) 3.5 - 5.0 g/dL   AST 14 (L) 15 - 41 U/L   ALT 11 (L) 14 - 54 U/L   Alkaline Phosphatase 85 38 - 126 U/L   Total Bilirubin 0.4 0.3 - 1.2 mg/dL   GFR calc non Af Amer 33 (L) >60 mL/min   GFR calc Af Amer 38 (L) >60 mL/min   Anion gap 4 (L) 5 - 15  CBC   Collection Time: 02/24/16  5:44 AM  Result Value Ref Range   WBC 5.3 4.0 - 10.5 K/uL   RBC 3.47 (L) 3.87 - 5.11 MIL/uL   Hemoglobin 10.3 (L) 12.0 - 15.0 g/dL   HCT 91.4 (L) 78.2 - 95.6 %   MCV 100.9 (H) 78.0 - 100.0 fL   MCH 29.7 26.0 - 34.0 pg   MCHC 29.4 (L) 30.0 - 36.0 g/dL   RDW 21.3 (H) 08.6 - 57.8 %   Platelets 224 150 - 400 K/uL  Glucose, capillary   Collection Time: 02/24/16  7:34 AM  Result Value Ref Range   Glucose-Capillary 77 65 - 99 mg/dL   Comment 1 Notify RN    Comment 2 Document in Chart   Glucose, capillary   Collection Time: 02/24/16 11:27 AM  Result Value Ref Range   Glucose-Capillary 98 65 - 99 mg/dL  Glucose, capillary   Collection Time: 02/24/16  4:22 PM  Result Value Ref Range   Glucose-Capillary 98 65 - 99 mg/dL  Glucose, capillary   Collection Time: 02/24/16  8:33 PM  Result Value Ref Range   Glucose-Capillary 125 (H) 65 - 99 mg/dL   Comment 1 Notify RN    Comment 2 Document in Chart   Basic metabolic panel   Collection Time: 02/25/16  6:44 AM  Result Value Ref Range   Sodium 139 135 - 145 mmol/L   Potassium 4.1 3.5 - 5.1 mmol/L   Chloride 109 101 - 111 mmol/L   CO2 27 22 - 32 mmol/L   Glucose, Bld 94 65 - 99 mg/dL   BUN 35 (H) 6 - 20 mg/dL   Creatinine, Ser 4.69 (H) 0.44 - 1.00 mg/dL   Calcium 8.8 (L) 8.9 - 10.3 mg/dL   GFR calc non Af Amer 38 (L) >60 mL/min   GFR calc Af Amer 45 (L) >60 mL/min   Anion gap 3 (L) 5 - 15  CBC   Collection Time: 02/25/16  6:44 AM  Result Value  Ref Range   WBC 4.1 4.0 - 10.5 K/uL   RBC 3.15 (L) 3.87 - 5.11 MIL/uL   Hemoglobin 9.7 (L) 12.0 - 15.0 g/dL   HCT 16.1 (L) 09.6 - 04.5 %   MCV 100.3 (H) 78.0 - 100.0 fL   MCH 30.8 26.0 - 34.0 pg   MCHC 30.7 30.0 - 36.0 g/dL   RDW 40.9 (H) 81.1 - 91.4 %   Platelets 203 150 - 400 K/uL  Glucose, capillary   Collection Time: 02/25/16  7:12 AM  Result Value Ref Range   Glucose-Capillary 96 65 - 99 mg/dL  Basic metabolic panel   Collection Time: 02/26/16  6:16 AM  Result Value Ref Range   Sodium 140 135 - 145 mmol/L   Potassium 4.1 3.5 - 5.1 mmol/L   Chloride 110 101 - 111 mmol/L   CO2 26 22 - 32 mmol/L   Glucose, Bld 91 65 - 99 mg/dL   BUN 34 (H) 6 - 20 mg/dL   Creatinine, Ser 7.82 (H) 0.44 - 1.00 mg/dL   Calcium 8.8 (L) 8.9 - 10.3 mg/dL   GFR calc non Af Amer 37 (L) >60 mL/min   GFR calc Af Amer 43 (L) >60 mL/min   Anion gap 4 (L) 5 - 15  CBC   Collection Time: 02/26/16  6:16 AM  Result Value Ref Range   WBC 3.8 (L) 4.0 - 10.5 K/uL   RBC 3.17 (L) 3.87 - 5.11 MIL/uL   Hemoglobin 9.4 (L) 12.0 - 15.0 g/dL   HCT 95.6 (L) 21.3 - 08.6 %   MCV 99.7 78.0 - 100.0 fL   MCH 29.7 26.0 - 34.0 pg   MCHC 29.7 (L) 30.0 - 36.0 g/dL   RDW 57.8 (H) 46.9 - 62.9 %   Platelets 231 150 - 400 K/uL  Results for orders placed or performed during the hospital encounter of 07/15/15 (from the past 8736 hour(s))  Urine culture   Collection Time: 07/15/15  2:30 AM  Result Value Ref Range   Specimen Description URINE, CATHETERIZED    Special Requests NONE    Culture      >=100,000 COLONIES/mL ENTEROCOCCUS SPECIES Performed at [facility]    Report Status 07/18/2015 FINAL    Organism ID, Bacteria ENTEROCOCCUS SPECIES       Susceptibility   Enterococcus species - MIC*    AMPICILLIN 16 RESISTANT Resistant     LEVOFLOXACIN >=8 RESISTANT Resistant     NITROFURANTOIN 256 RESISTANT Resistant     VANCOMYCIN <=0.5 SENSITIVE Sensitive     LINEZOLID 2 SENSITIVE Sensitive     * >=100,000  COLONIES/mL ENTEROCOCCUS SPECIES  Urinalysis, Routine w reflex microscopic (not at [facility])   Collection Time: 07/15/15  2:30 AM  Result Value Ref Range   Color, Urine YELLOW YELLOW   APPearance CLOUDY (A) CLEAR   Specific Gravity, Urine 1.020 1.005 - 1.030   pH 5.5 5.0 - 8.0   Glucose, UA NEGATIVE NEGATIVE mg/dL   Hgb urine dipstick TRACE (A) NEGATIVE   Bilirubin Urine NEGATIVE NEGATIVE   Ketones, ur NEGATIVE NEGATIVE mg/dL   Protein, ur NEGATIVE NEGATIVE mg/dL   Nitrite NEGATIVE NEGATIVE   Leukocytes, UA MODERATE (A) NEGATIVE  Urine microscopic-add on   Collection Time: 07/15/15  2:30 AM  Result Value Ref Range   Squamous Epithelial / LPF 0-5 (A) NONE SEEN   WBC, UA TOO NUMEROUS TO COUNT 0 - 5 WBC/hpf   RBC / HPF 0-5 0 - 5 RBC/hpf  Bacteria, UA MANY (A) NONE SEEN  CBC with Differential/Platelet   Collection Time: 07/15/15  3:05 AM  Result Value Ref Range   WBC 8.2 4.0 - 10.5 K/uL   RBC 3.90 3.87 - 5.11 MIL/uL   Hemoglobin 11.8 (L) 12.0 - 15.0 g/dL   HCT 16.1 09.6 - 04.5 %   MCV 97.7 78.0 - 100.0 fL   MCH 30.3 26.0 - 34.0 pg   MCHC 31.0 30.0 - 36.0 g/dL   RDW 40.9 (H) 81.1 - 91.4 %   Platelets 218 150 - 400 K/uL   Neutrophils Relative % 79 %   Neutro Abs 6.3 1.7 - 7.7 K/uL   Lymphocytes Relative 13 %   Lymphs Abs 1.1 0.7 - 4.0 K/uL   Monocytes Relative 6 %   Monocytes Absolute 0.5 0.1 - 1.0 K/uL   Eosinophils Relative 2 %   Eosinophils Absolute 0.2 0.0 - 0.7 K/uL   Basophils Relative 0 %   Basophils Absolute 0.0 0.0 - 0.1 K/uL  Comprehensive metabolic panel   Collection Time: 07/15/15  3:05 AM  Result Value Ref Range   Sodium 141 135 - 145 mmol/L   Potassium 3.7 3.5 - 5.1 mmol/L   Chloride 104 101 - 111 mmol/L   CO2 24 22 - 32 mmol/L   Glucose, Bld 104 (H) 65 - 99 mg/dL   BUN 35 (H) 6 - 20 mg/dL   Creatinine, Ser 7.82 (H) 0.44 - 1.00 mg/dL   Calcium 95.6 8.9 - 21.3 mg/dL   Total Protein 7.0 6.5 - 8.1 g/dL   Albumin 3.4 (L) 3.5 - 5.0 g/dL   AST 40 15 - 41 U/L    ALT 22 14 - 54 U/L   Alkaline Phosphatase 85 38 - 126 U/L   Total Bilirubin 0.7 0.3 - 1.2 mg/dL   GFR calc non Af Amer 22 (L) >60 mL/min   GFR calc Af Amer 25 (L) >60 mL/min   Anion gap 13 5 - 15  Troponin I   Collection Time: 07/15/15  3:05 AM  Result Value Ref Range   Troponin I <0.03 <0.031 ng/mL  MRSA PCR Screening   Collection Time: 07/15/15  5:39 AM  Result Value Ref Range   MRSA by PCR NEGATIVE NEGATIVE  CBC   Collection Time: 07/15/15  6:34 AM  Result Value Ref Range   WBC 5.3 4.0 - 10.5 K/uL   RBC 3.53 (L) 3.87 - 5.11 MIL/uL   Hemoglobin 10.8 (L) 12.0 - 15.0 g/dL   HCT 08.6 (L) 57.8 - 46.9 %   MCV 97.7 78.0 - 100.0 fL   MCH 30.6 26.0 - 34.0 pg   MCHC 31.3 30.0 - 36.0 g/dL   RDW 62.9 (H) 52.8 - 41.3 %   Platelets 193 150 - 400 K/uL  Creatinine, serum   Collection Time: 07/15/15  6:34 AM  Result Value Ref Range   Creatinine, Ser 2.20 (H) 0.44 - 1.00 mg/dL   GFR calc non Af Amer 21 (L) >60 mL/min   GFR calc Af Amer 24 (L) >60 mL/min  TSH   Collection Time: 07/15/15  6:34 AM  Result Value Ref Range   TSH 0.588 0.350 - 4.500 uIU/mL  Basic metabolic panel   Collection Time: 07/16/15  7:11 AM  Result Value Ref Range   Sodium 140 135 - 145 mmol/L   Potassium 3.5 3.5 - 5.1 mmol/L   Chloride 104 101 - 111 mmol/L   CO2 28 22 - 32 mmol/L  Glucose, Bld 81 65 - 99 mg/dL   BUN 32 (H) 6 - 20 mg/dL   Creatinine, Ser 9.60 (H) 0.44 - 1.00 mg/dL   Calcium 9.2 8.9 - 45.4 mg/dL   GFR calc non Af Amer 22 (L) >60 mL/min   GFR calc Af Amer 26 (L) >60 mL/min   Anion gap 8 5 - 15  CBC   Collection Time: 07/16/15  7:11 AM  Result Value Ref Range   WBC 4.4 4.0 - 10.5 K/uL   RBC 3.34 (L) 3.87 - 5.11 MIL/uL   Hemoglobin 10.3 (L) 12.0 - 15.0 g/dL   HCT 09.8 (L) 11.9 - 14.7 %   MCV 97.0 78.0 - 100.0 fL   MCH 30.8 26.0 - 34.0 pg   MCHC 31.8 30.0 - 36.0 g/dL   RDW 82.9 (H) 56.2 - 13.0 %   Platelets 194 150 - 400 K/uL  Renal function panel   Collection Time: 07/17/15  7:53 AM   Result Value Ref Range   Sodium 141 135 - 145 mmol/L   Potassium 3.5 3.5 - 5.1 mmol/L   Chloride 107 101 - 111 mmol/L   CO2 27 22 - 32 mmol/L   Glucose, Bld 88 65 - 99 mg/dL   BUN 22 (H) 6 - 20 mg/dL   Creatinine, Ser 8.65 (H) 0.44 - 1.00 mg/dL   Calcium 9.2 8.9 - 78.4 mg/dL   Phosphorus 1.7 (L) 2.5 - 4.6 mg/dL   Albumin 3.0 (L) 3.5 - 5.0 g/dL   GFR calc non Af Amer 29 (L) >60 mL/min   GFR calc Af Amer 34 (L) >60 mL/min   Anion gap 7 5 - 15  Basic metabolic panel   Collection Time: 07/18/15  6:29 AM  Result Value Ref Range   Sodium 143 135 - 145 mmol/L   Potassium 3.7 3.5 - 5.1 mmol/L   Chloride 112 (H) 101 - 111 mmol/L   CO2 26 22 - 32 mmol/L   Glucose, Bld 68 65 - 99 mg/dL   BUN 19 6 - 20 mg/dL   Creatinine, Ser 6.96 (H) 0.44 - 1.00 mg/dL   Calcium 8.9 8.9 - 29.5 mg/dL   GFR calc non Af Amer 34 (L) >60 mL/min   GFR calc Af Amer 39 (L) >60 mL/min   Anion gap 5 5 - 15  CBC   Collection Time: 07/18/15  6:29 AM  Result Value Ref Range   WBC 6.3 4.0 - 10.5 K/uL   RBC 3.26 (L) 3.87 - 5.11 MIL/uL   Hemoglobin 9.8 (L) 12.0 - 15.0 g/dL   HCT 28.4 (L) 13.2 - 44.0 %   MCV 96.9 78.0 - 100.0 fL   MCH 30.1 26.0 - 34.0 pg   MCHC 31.0 30.0 - 36.0 g/dL   RDW 10.2 (H) 72.5 - 36.6 %   Platelets 229 150 - 400 K/uL   she had labs done last week with her primary care physician we will get the result Diagnoses 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 We'll continue Wellbutrin and Paxil for depression, Lamictal for mood stabilization, Risperdal for agitation and Cogentin for side effects from Risperdal. She'll take clonazepam 0.5 mg daily at 5 PM for at anxiety.    Time spent 15 minutes.  More than 50%  of the time spent and psychoeducation,  counseling and coordination of care. She'll return in 4 months  MEDICATIONS this encounter: Meds ordered this encounter  Medications  . solifenacin (VESICARE) 10 MG tablet    Sig: Take 10 mg by mouth daily.  . Warfarin Sodium (COUMADIN PO)    Sig: Take 7 mg by mouth daily.  Marland Kitchen. alum & mag hydroxide-simeth (MAALOX PLUS) 400-400-40 MG/5ML suspension    Sig: Take by mouth every 6 (six) hours as needed for indigestion.  . Calcium Carbonate (CALCIUM 600 PO)    Sig: Take 600 mg by mouth daily.  . benztropine (COGENTIN) 0.5 MG tablet    Sig: Take 1 tablet (0.5 mg total) by mouth 2 (two) times daily.    Dispense:  60 tablet    Refill:  3  . buPROPion (WELLBUTRIN XL) 300 MG 24 hr tablet    Sig: Take 1 tablet (300 mg total) by mouth every morning.    Dispense:  30 tablet    Refill:  3  . donepezil (ARICEPT) 5 MG tablet    Sig: Take 1 tablet (5 mg total) by mouth at bedtime.    Dispense:  30 tablet    Refill:  2  . lamoTRIgine (LAMICTAL) 100 MG tablet    Sig: Take 1 tablet (100 mg total) by mouth 2 (two) times daily.    Dispense:  60 tablet    Refill:  2  . PARoxetine (PAXIL) 40 MG tablet    Sig: Take 1 tablet (40 mg total) by mouth at bedtime.    Dispense:  30 tablet    Refill:  3  . risperiDONE (RISPERDAL) 0.25 MG tablet    Sig: Take 1 tablet (0.25 mg total) by mouth 3 (three) times daily.    Dispense:  90 tablet    Refill:  3  . risperiDONE (RISPERDAL) 1 MG tablet    Sig: Take 1 tablet (1 mg total) by mouth at bedtime.    Dispense:  30 tablet    Refill:  3  . clonazePAM (KLONOPIN) 0.5 MG tablet    Sig: Take daily at 5 pm    Dispense:  30 tablet    Refill:  3    Medical Decision Making Problem Points:  Established problem, stable/improving (1), Established problem, worsening (2), New problem, with additional work-up planned (4), Review of last therapy session (1) and Review of psycho-social stressors (1) Data Points:  Review of medication regiment & side effects (2) Review of  new medications or change in dosage (2)  I certify that outpatient services furnished can reasonably be expected to improve the patient's condition.   Diannia RuderOSS, Constantina Laseter, MD

## 2016-06-13 DIAGNOSIS — I82421 Acute embolism and thrombosis of right iliac vein: Secondary | ICD-10-CM | POA: Diagnosis not present

## 2016-06-13 DIAGNOSIS — R627 Adult failure to thrive: Secondary | ICD-10-CM | POA: Diagnosis not present

## 2016-06-13 DIAGNOSIS — I4891 Unspecified atrial fibrillation: Secondary | ICD-10-CM | POA: Diagnosis not present

## 2016-06-17 DIAGNOSIS — I4891 Unspecified atrial fibrillation: Secondary | ICD-10-CM | POA: Diagnosis not present

## 2016-06-17 DIAGNOSIS — E162 Hypoglycemia, unspecified: Secondary | ICD-10-CM | POA: Diagnosis not present

## 2016-06-17 DIAGNOSIS — I82421 Acute embolism and thrombosis of right iliac vein: Secondary | ICD-10-CM | POA: Diagnosis not present

## 2016-06-17 DIAGNOSIS — M79606 Pain in leg, unspecified: Secondary | ICD-10-CM | POA: Diagnosis not present

## 2016-06-17 DIAGNOSIS — N179 Acute kidney failure, unspecified: Secondary | ICD-10-CM | POA: Diagnosis not present

## 2016-06-20 DIAGNOSIS — I4891 Unspecified atrial fibrillation: Secondary | ICD-10-CM | POA: Diagnosis not present

## 2016-06-20 DIAGNOSIS — I82421 Acute embolism and thrombosis of right iliac vein: Secondary | ICD-10-CM | POA: Diagnosis not present

## 2016-06-20 DIAGNOSIS — I82409 Acute embolism and thrombosis of unspecified deep veins of unspecified lower extremity: Secondary | ICD-10-CM | POA: Diagnosis not present

## 2016-06-20 DIAGNOSIS — Z7901 Long term (current) use of anticoagulants: Secondary | ICD-10-CM | POA: Diagnosis not present

## 2016-06-20 DIAGNOSIS — M79606 Pain in leg, unspecified: Secondary | ICD-10-CM | POA: Diagnosis not present

## 2016-06-20 DIAGNOSIS — R627 Adult failure to thrive: Secondary | ICD-10-CM | POA: Diagnosis not present

## 2016-06-24 DIAGNOSIS — Z79899 Other long term (current) drug therapy: Secondary | ICD-10-CM | POA: Diagnosis not present

## 2016-06-24 DIAGNOSIS — M79606 Pain in leg, unspecified: Secondary | ICD-10-CM | POA: Diagnosis not present

## 2016-06-24 DIAGNOSIS — R319 Hematuria, unspecified: Secondary | ICD-10-CM | POA: Diagnosis not present

## 2016-06-24 DIAGNOSIS — R6889 Other general symptoms and signs: Secondary | ICD-10-CM | POA: Diagnosis not present

## 2016-06-24 DIAGNOSIS — N390 Urinary tract infection, site not specified: Secondary | ICD-10-CM | POA: Diagnosis not present

## 2016-06-24 DIAGNOSIS — Z7901 Long term (current) use of anticoagulants: Secondary | ICD-10-CM | POA: Diagnosis not present

## 2016-06-24 DIAGNOSIS — I82409 Acute embolism and thrombosis of unspecified deep veins of unspecified lower extremity: Secondary | ICD-10-CM | POA: Diagnosis not present

## 2016-06-24 DIAGNOSIS — R627 Adult failure to thrive: Secondary | ICD-10-CM | POA: Diagnosis not present

## 2016-06-24 DIAGNOSIS — I4891 Unspecified atrial fibrillation: Secondary | ICD-10-CM | POA: Diagnosis not present

## 2016-06-25 DIAGNOSIS — D72829 Elevated white blood cell count, unspecified: Secondary | ICD-10-CM | POA: Diagnosis not present

## 2016-06-25 DIAGNOSIS — I82421 Acute embolism and thrombosis of right iliac vein: Secondary | ICD-10-CM | POA: Diagnosis not present

## 2016-06-25 DIAGNOSIS — Z7901 Long term (current) use of anticoagulants: Secondary | ICD-10-CM | POA: Diagnosis not present

## 2016-06-25 DIAGNOSIS — I4891 Unspecified atrial fibrillation: Secondary | ICD-10-CM | POA: Diagnosis not present

## 2016-06-27 DIAGNOSIS — R627 Adult failure to thrive: Secondary | ICD-10-CM | POA: Diagnosis not present

## 2016-06-27 DIAGNOSIS — M79606 Pain in leg, unspecified: Secondary | ICD-10-CM | POA: Diagnosis not present

## 2016-06-27 DIAGNOSIS — D649 Anemia, unspecified: Secondary | ICD-10-CM | POA: Diagnosis not present

## 2016-06-27 DIAGNOSIS — I82409 Acute embolism and thrombosis of unspecified deep veins of unspecified lower extremity: Secondary | ICD-10-CM | POA: Diagnosis not present

## 2016-06-27 DIAGNOSIS — I82421 Acute embolism and thrombosis of right iliac vein: Secondary | ICD-10-CM | POA: Diagnosis not present

## 2016-06-27 DIAGNOSIS — Z7901 Long term (current) use of anticoagulants: Secondary | ICD-10-CM | POA: Diagnosis not present

## 2016-06-27 DIAGNOSIS — N390 Urinary tract infection, site not specified: Secondary | ICD-10-CM | POA: Diagnosis not present

## 2016-06-27 DIAGNOSIS — I4891 Unspecified atrial fibrillation: Secondary | ICD-10-CM | POA: Diagnosis not present

## 2016-07-01 DIAGNOSIS — R6889 Other general symptoms and signs: Secondary | ICD-10-CM | POA: Diagnosis not present

## 2016-07-01 DIAGNOSIS — R627 Adult failure to thrive: Secondary | ICD-10-CM | POA: Diagnosis not present

## 2016-07-01 DIAGNOSIS — I82409 Acute embolism and thrombosis of unspecified deep veins of unspecified lower extremity: Secondary | ICD-10-CM | POA: Diagnosis not present

## 2016-07-01 DIAGNOSIS — N390 Urinary tract infection, site not specified: Secondary | ICD-10-CM | POA: Diagnosis not present

## 2016-07-01 DIAGNOSIS — D696 Thrombocytopenia, unspecified: Secondary | ICD-10-CM | POA: Diagnosis not present

## 2016-07-01 DIAGNOSIS — I4891 Unspecified atrial fibrillation: Secondary | ICD-10-CM | POA: Diagnosis not present

## 2016-07-01 DIAGNOSIS — N179 Acute kidney failure, unspecified: Secondary | ICD-10-CM | POA: Diagnosis not present

## 2016-07-01 DIAGNOSIS — N183 Chronic kidney disease, stage 3 (moderate): Secondary | ICD-10-CM | POA: Diagnosis not present

## 2016-07-01 DIAGNOSIS — I82421 Acute embolism and thrombosis of right iliac vein: Secondary | ICD-10-CM | POA: Diagnosis not present

## 2016-07-02 DIAGNOSIS — B351 Tinea unguium: Secondary | ICD-10-CM | POA: Diagnosis not present

## 2016-07-02 DIAGNOSIS — M79674 Pain in right toe(s): Secondary | ICD-10-CM | POA: Diagnosis not present

## 2016-07-02 DIAGNOSIS — M79675 Pain in left toe(s): Secondary | ICD-10-CM | POA: Diagnosis not present

## 2016-07-04 DIAGNOSIS — I4891 Unspecified atrial fibrillation: Secondary | ICD-10-CM | POA: Diagnosis not present

## 2016-07-04 DIAGNOSIS — Z7901 Long term (current) use of anticoagulants: Secondary | ICD-10-CM | POA: Diagnosis not present

## 2016-07-08 DIAGNOSIS — N390 Urinary tract infection, site not specified: Secondary | ICD-10-CM | POA: Diagnosis not present

## 2016-07-08 DIAGNOSIS — I82409 Acute embolism and thrombosis of unspecified deep veins of unspecified lower extremity: Secondary | ICD-10-CM | POA: Diagnosis not present

## 2016-07-08 DIAGNOSIS — R627 Adult failure to thrive: Secondary | ICD-10-CM | POA: Diagnosis not present

## 2016-07-08 DIAGNOSIS — Z7901 Long term (current) use of anticoagulants: Secondary | ICD-10-CM | POA: Diagnosis not present

## 2016-07-08 DIAGNOSIS — N183 Chronic kidney disease, stage 3 (moderate): Secondary | ICD-10-CM | POA: Diagnosis not present

## 2016-07-08 DIAGNOSIS — R6889 Other general symptoms and signs: Secondary | ICD-10-CM | POA: Diagnosis not present

## 2016-07-08 DIAGNOSIS — I4891 Unspecified atrial fibrillation: Secondary | ICD-10-CM | POA: Diagnosis not present

## 2016-07-15 DIAGNOSIS — Z7901 Long term (current) use of anticoagulants: Secondary | ICD-10-CM | POA: Diagnosis not present

## 2016-07-15 DIAGNOSIS — I4891 Unspecified atrial fibrillation: Secondary | ICD-10-CM | POA: Diagnosis not present

## 2016-07-15 DIAGNOSIS — R6889 Other general symptoms and signs: Secondary | ICD-10-CM | POA: Diagnosis not present

## 2016-07-16 DIAGNOSIS — I4891 Unspecified atrial fibrillation: Secondary | ICD-10-CM | POA: Diagnosis not present

## 2016-07-16 DIAGNOSIS — I82421 Acute embolism and thrombosis of right iliac vein: Secondary | ICD-10-CM | POA: Diagnosis not present

## 2016-07-16 DIAGNOSIS — Z7901 Long term (current) use of anticoagulants: Secondary | ICD-10-CM | POA: Diagnosis not present

## 2016-07-17 DIAGNOSIS — I82421 Acute embolism and thrombosis of right iliac vein: Secondary | ICD-10-CM | POA: Diagnosis not present

## 2016-07-17 DIAGNOSIS — I4891 Unspecified atrial fibrillation: Secondary | ICD-10-CM | POA: Diagnosis not present

## 2016-07-18 DIAGNOSIS — R1310 Dysphagia, unspecified: Secondary | ICD-10-CM | POA: Diagnosis not present

## 2016-07-19 DIAGNOSIS — R1310 Dysphagia, unspecified: Secondary | ICD-10-CM | POA: Diagnosis not present

## 2016-07-20 DIAGNOSIS — I4891 Unspecified atrial fibrillation: Secondary | ICD-10-CM | POA: Diagnosis not present

## 2016-07-20 DIAGNOSIS — Z7901 Long term (current) use of anticoagulants: Secondary | ICD-10-CM | POA: Diagnosis not present

## 2016-07-20 DIAGNOSIS — I82421 Acute embolism and thrombosis of right iliac vein: Secondary | ICD-10-CM | POA: Diagnosis not present

## 2016-07-20 DIAGNOSIS — D696 Thrombocytopenia, unspecified: Secondary | ICD-10-CM | POA: Diagnosis not present

## 2016-07-21 DIAGNOSIS — R1310 Dysphagia, unspecified: Secondary | ICD-10-CM | POA: Diagnosis not present

## 2016-07-22 DIAGNOSIS — R1310 Dysphagia, unspecified: Secondary | ICD-10-CM | POA: Diagnosis not present

## 2016-07-23 DIAGNOSIS — R6889 Other general symptoms and signs: Secondary | ICD-10-CM | POA: Diagnosis not present

## 2016-07-23 DIAGNOSIS — R1310 Dysphagia, unspecified: Secondary | ICD-10-CM | POA: Diagnosis not present

## 2016-07-23 DIAGNOSIS — I4891 Unspecified atrial fibrillation: Secondary | ICD-10-CM | POA: Diagnosis not present

## 2016-07-23 DIAGNOSIS — Z7901 Long term (current) use of anticoagulants: Secondary | ICD-10-CM | POA: Diagnosis not present

## 2016-07-25 DIAGNOSIS — R1310 Dysphagia, unspecified: Secondary | ICD-10-CM | POA: Diagnosis not present

## 2016-07-27 DIAGNOSIS — R1310 Dysphagia, unspecified: Secondary | ICD-10-CM | POA: Diagnosis not present

## 2016-07-28 DIAGNOSIS — R1310 Dysphagia, unspecified: Secondary | ICD-10-CM | POA: Diagnosis not present

## 2016-07-30 DIAGNOSIS — R1310 Dysphagia, unspecified: Secondary | ICD-10-CM | POA: Diagnosis not present

## 2016-07-30 DIAGNOSIS — I82421 Acute embolism and thrombosis of right iliac vein: Secondary | ICD-10-CM | POA: Diagnosis not present

## 2016-07-30 DIAGNOSIS — I4891 Unspecified atrial fibrillation: Secondary | ICD-10-CM | POA: Diagnosis not present

## 2016-07-30 DIAGNOSIS — N179 Acute kidney failure, unspecified: Secondary | ICD-10-CM | POA: Diagnosis not present

## 2016-07-30 DIAGNOSIS — D649 Anemia, unspecified: Secondary | ICD-10-CM | POA: Diagnosis not present

## 2016-07-30 DIAGNOSIS — I10 Essential (primary) hypertension: Secondary | ICD-10-CM | POA: Diagnosis not present

## 2016-07-31 DIAGNOSIS — R1310 Dysphagia, unspecified: Secondary | ICD-10-CM | POA: Diagnosis not present

## 2016-08-02 DIAGNOSIS — R1310 Dysphagia, unspecified: Secondary | ICD-10-CM | POA: Diagnosis not present

## 2016-08-03 DIAGNOSIS — R1310 Dysphagia, unspecified: Secondary | ICD-10-CM | POA: Diagnosis not present

## 2016-08-04 DIAGNOSIS — R1310 Dysphagia, unspecified: Secondary | ICD-10-CM | POA: Diagnosis not present

## 2016-08-05 DIAGNOSIS — N179 Acute kidney failure, unspecified: Secondary | ICD-10-CM | POA: Diagnosis not present

## 2016-08-05 DIAGNOSIS — D696 Thrombocytopenia, unspecified: Secondary | ICD-10-CM | POA: Diagnosis not present

## 2016-08-05 DIAGNOSIS — D649 Anemia, unspecified: Secondary | ICD-10-CM | POA: Diagnosis not present

## 2016-08-05 DIAGNOSIS — Z7901 Long term (current) use of anticoagulants: Secondary | ICD-10-CM | POA: Diagnosis not present

## 2016-08-05 DIAGNOSIS — R6889 Other general symptoms and signs: Secondary | ICD-10-CM | POA: Diagnosis not present

## 2016-08-05 DIAGNOSIS — I4891 Unspecified atrial fibrillation: Secondary | ICD-10-CM | POA: Diagnosis not present

## 2016-08-05 DIAGNOSIS — I82409 Acute embolism and thrombosis of unspecified deep veins of unspecified lower extremity: Secondary | ICD-10-CM | POA: Diagnosis not present

## 2016-08-05 DIAGNOSIS — N183 Chronic kidney disease, stage 3 (moderate): Secondary | ICD-10-CM | POA: Diagnosis not present

## 2016-08-06 DIAGNOSIS — R1310 Dysphagia, unspecified: Secondary | ICD-10-CM | POA: Diagnosis not present

## 2016-08-07 DIAGNOSIS — R1310 Dysphagia, unspecified: Secondary | ICD-10-CM | POA: Diagnosis not present

## 2016-08-08 DIAGNOSIS — R7989 Other specified abnormal findings of blood chemistry: Secondary | ICD-10-CM | POA: Diagnosis not present

## 2016-08-08 DIAGNOSIS — Z7901 Long term (current) use of anticoagulants: Secondary | ICD-10-CM | POA: Diagnosis not present

## 2016-08-08 DIAGNOSIS — D649 Anemia, unspecified: Secondary | ICD-10-CM | POA: Diagnosis not present

## 2016-08-08 DIAGNOSIS — R1310 Dysphagia, unspecified: Secondary | ICD-10-CM | POA: Diagnosis not present

## 2016-08-08 DIAGNOSIS — I82421 Acute embolism and thrombosis of right iliac vein: Secondary | ICD-10-CM | POA: Diagnosis not present

## 2016-08-08 DIAGNOSIS — I4891 Unspecified atrial fibrillation: Secondary | ICD-10-CM | POA: Diagnosis not present

## 2016-08-09 DIAGNOSIS — R1310 Dysphagia, unspecified: Secondary | ICD-10-CM | POA: Diagnosis not present

## 2016-08-11 DIAGNOSIS — R1310 Dysphagia, unspecified: Secondary | ICD-10-CM | POA: Diagnosis not present

## 2016-08-12 DIAGNOSIS — R7989 Other specified abnormal findings of blood chemistry: Secondary | ICD-10-CM | POA: Diagnosis not present

## 2016-08-12 DIAGNOSIS — N179 Acute kidney failure, unspecified: Secondary | ICD-10-CM | POA: Diagnosis not present

## 2016-08-12 DIAGNOSIS — I82409 Acute embolism and thrombosis of unspecified deep veins of unspecified lower extremity: Secondary | ICD-10-CM | POA: Diagnosis not present

## 2016-08-12 DIAGNOSIS — R6889 Other general symptoms and signs: Secondary | ICD-10-CM | POA: Diagnosis not present

## 2016-08-12 DIAGNOSIS — D696 Thrombocytopenia, unspecified: Secondary | ICD-10-CM | POA: Diagnosis not present

## 2016-08-12 DIAGNOSIS — D649 Anemia, unspecified: Secondary | ICD-10-CM | POA: Diagnosis not present

## 2016-08-12 DIAGNOSIS — N183 Chronic kidney disease, stage 3 (moderate): Secondary | ICD-10-CM | POA: Diagnosis not present

## 2016-08-12 DIAGNOSIS — I4891 Unspecified atrial fibrillation: Secondary | ICD-10-CM | POA: Diagnosis not present

## 2016-08-12 DIAGNOSIS — Z7901 Long term (current) use of anticoagulants: Secondary | ICD-10-CM | POA: Diagnosis not present

## 2016-08-13 DIAGNOSIS — R1310 Dysphagia, unspecified: Secondary | ICD-10-CM | POA: Diagnosis not present

## 2016-08-14 DIAGNOSIS — R1310 Dysphagia, unspecified: Secondary | ICD-10-CM | POA: Diagnosis not present

## 2016-08-15 DIAGNOSIS — R7989 Other specified abnormal findings of blood chemistry: Secondary | ICD-10-CM | POA: Diagnosis not present

## 2016-08-15 DIAGNOSIS — Z7901 Long term (current) use of anticoagulants: Secondary | ICD-10-CM | POA: Diagnosis not present

## 2016-08-15 DIAGNOSIS — I4891 Unspecified atrial fibrillation: Secondary | ICD-10-CM | POA: Diagnosis not present

## 2016-08-15 DIAGNOSIS — I82421 Acute embolism and thrombosis of right iliac vein: Secondary | ICD-10-CM | POA: Diagnosis not present

## 2016-08-15 DIAGNOSIS — I82409 Acute embolism and thrombosis of unspecified deep veins of unspecified lower extremity: Secondary | ICD-10-CM | POA: Diagnosis not present

## 2016-08-15 DIAGNOSIS — L89609 Pressure ulcer of unspecified heel, unspecified stage: Secondary | ICD-10-CM | POA: Diagnosis not present

## 2016-08-15 DIAGNOSIS — E46 Unspecified protein-calorie malnutrition: Secondary | ICD-10-CM | POA: Diagnosis not present

## 2016-08-16 DIAGNOSIS — R1310 Dysphagia, unspecified: Secondary | ICD-10-CM | POA: Diagnosis not present

## 2016-08-17 DIAGNOSIS — R1310 Dysphagia, unspecified: Secondary | ICD-10-CM | POA: Diagnosis not present

## 2016-08-18 DIAGNOSIS — R1310 Dysphagia, unspecified: Secondary | ICD-10-CM | POA: Diagnosis not present

## 2016-08-19 DIAGNOSIS — R7989 Other specified abnormal findings of blood chemistry: Secondary | ICD-10-CM | POA: Diagnosis not present

## 2016-08-19 DIAGNOSIS — R1310 Dysphagia, unspecified: Secondary | ICD-10-CM | POA: Diagnosis not present

## 2016-08-19 DIAGNOSIS — R6889 Other general symptoms and signs: Secondary | ICD-10-CM | POA: Diagnosis not present

## 2016-08-19 DIAGNOSIS — D649 Anemia, unspecified: Secondary | ICD-10-CM | POA: Diagnosis not present

## 2016-08-19 DIAGNOSIS — I4891 Unspecified atrial fibrillation: Secondary | ICD-10-CM | POA: Diagnosis not present

## 2016-08-19 DIAGNOSIS — Z7901 Long term (current) use of anticoagulants: Secondary | ICD-10-CM | POA: Diagnosis not present

## 2016-08-20 DIAGNOSIS — R1310 Dysphagia, unspecified: Secondary | ICD-10-CM | POA: Diagnosis not present

## 2016-08-21 DIAGNOSIS — R1310 Dysphagia, unspecified: Secondary | ICD-10-CM | POA: Diagnosis not present

## 2016-08-23 DIAGNOSIS — R1310 Dysphagia, unspecified: Secondary | ICD-10-CM | POA: Diagnosis not present

## 2016-08-25 DIAGNOSIS — R1310 Dysphagia, unspecified: Secondary | ICD-10-CM | POA: Diagnosis not present

## 2016-08-26 DIAGNOSIS — I4891 Unspecified atrial fibrillation: Secondary | ICD-10-CM | POA: Diagnosis not present

## 2016-08-26 DIAGNOSIS — D649 Anemia, unspecified: Secondary | ICD-10-CM | POA: Diagnosis not present

## 2016-08-26 DIAGNOSIS — R6889 Other general symptoms and signs: Secondary | ICD-10-CM | POA: Diagnosis not present

## 2016-08-26 DIAGNOSIS — Z7901 Long term (current) use of anticoagulants: Secondary | ICD-10-CM | POA: Diagnosis not present

## 2016-08-26 DIAGNOSIS — R1310 Dysphagia, unspecified: Secondary | ICD-10-CM | POA: Diagnosis not present

## 2016-08-26 DIAGNOSIS — R7989 Other specified abnormal findings of blood chemistry: Secondary | ICD-10-CM | POA: Diagnosis not present

## 2016-08-27 DIAGNOSIS — R1310 Dysphagia, unspecified: Secondary | ICD-10-CM | POA: Diagnosis not present

## 2016-08-29 DIAGNOSIS — R1310 Dysphagia, unspecified: Secondary | ICD-10-CM | POA: Diagnosis not present

## 2016-08-30 DIAGNOSIS — R1310 Dysphagia, unspecified: Secondary | ICD-10-CM | POA: Diagnosis not present

## 2016-09-02 DIAGNOSIS — R1310 Dysphagia, unspecified: Secondary | ICD-10-CM | POA: Diagnosis not present

## 2016-09-02 DIAGNOSIS — D649 Anemia, unspecified: Secondary | ICD-10-CM | POA: Diagnosis not present

## 2016-09-02 DIAGNOSIS — R6889 Other general symptoms and signs: Secondary | ICD-10-CM | POA: Diagnosis not present

## 2016-09-02 DIAGNOSIS — I4891 Unspecified atrial fibrillation: Secondary | ICD-10-CM | POA: Diagnosis not present

## 2016-09-02 DIAGNOSIS — Z7901 Long term (current) use of anticoagulants: Secondary | ICD-10-CM | POA: Diagnosis not present

## 2016-09-02 DIAGNOSIS — E46 Unspecified protein-calorie malnutrition: Secondary | ICD-10-CM | POA: Diagnosis not present

## 2016-09-02 DIAGNOSIS — I82409 Acute embolism and thrombosis of unspecified deep veins of unspecified lower extremity: Secondary | ICD-10-CM | POA: Diagnosis not present

## 2016-09-02 DIAGNOSIS — R7989 Other specified abnormal findings of blood chemistry: Secondary | ICD-10-CM | POA: Diagnosis not present

## 2016-09-02 DIAGNOSIS — L89609 Pressure ulcer of unspecified heel, unspecified stage: Secondary | ICD-10-CM | POA: Diagnosis not present

## 2016-09-03 DIAGNOSIS — R1310 Dysphagia, unspecified: Secondary | ICD-10-CM | POA: Diagnosis not present

## 2016-09-04 DIAGNOSIS — R1310 Dysphagia, unspecified: Secondary | ICD-10-CM | POA: Diagnosis not present

## 2016-09-05 DIAGNOSIS — R1310 Dysphagia, unspecified: Secondary | ICD-10-CM | POA: Diagnosis not present

## 2016-09-06 DIAGNOSIS — R1310 Dysphagia, unspecified: Secondary | ICD-10-CM | POA: Diagnosis not present

## 2016-09-08 DIAGNOSIS — R1310 Dysphagia, unspecified: Secondary | ICD-10-CM | POA: Diagnosis not present

## 2016-09-09 DIAGNOSIS — R6889 Other general symptoms and signs: Secondary | ICD-10-CM | POA: Diagnosis not present

## 2016-09-09 DIAGNOSIS — D649 Anemia, unspecified: Secondary | ICD-10-CM | POA: Diagnosis not present

## 2016-09-09 DIAGNOSIS — I4891 Unspecified atrial fibrillation: Secondary | ICD-10-CM | POA: Diagnosis not present

## 2016-09-09 DIAGNOSIS — R7989 Other specified abnormal findings of blood chemistry: Secondary | ICD-10-CM | POA: Diagnosis not present

## 2016-09-09 DIAGNOSIS — Z7901 Long term (current) use of anticoagulants: Secondary | ICD-10-CM | POA: Diagnosis not present

## 2016-09-10 DIAGNOSIS — R1310 Dysphagia, unspecified: Secondary | ICD-10-CM | POA: Diagnosis not present

## 2016-09-11 DIAGNOSIS — R1310 Dysphagia, unspecified: Secondary | ICD-10-CM | POA: Diagnosis not present

## 2016-09-12 DIAGNOSIS — R1310 Dysphagia, unspecified: Secondary | ICD-10-CM | POA: Diagnosis not present

## 2016-09-14 DIAGNOSIS — R1310 Dysphagia, unspecified: Secondary | ICD-10-CM | POA: Diagnosis not present

## 2016-09-15 DIAGNOSIS — R1310 Dysphagia, unspecified: Secondary | ICD-10-CM | POA: Diagnosis not present

## 2016-09-16 DIAGNOSIS — I82409 Acute embolism and thrombosis of unspecified deep veins of unspecified lower extremity: Secondary | ICD-10-CM | POA: Diagnosis not present

## 2016-09-16 DIAGNOSIS — L304 Erythema intertrigo: Secondary | ICD-10-CM | POA: Diagnosis not present

## 2016-09-16 DIAGNOSIS — D649 Anemia, unspecified: Secondary | ICD-10-CM | POA: Diagnosis not present

## 2016-09-16 DIAGNOSIS — R6889 Other general symptoms and signs: Secondary | ICD-10-CM | POA: Diagnosis not present

## 2016-09-16 DIAGNOSIS — Z7901 Long term (current) use of anticoagulants: Secondary | ICD-10-CM | POA: Diagnosis not present

## 2016-09-16 DIAGNOSIS — R7989 Other specified abnormal findings of blood chemistry: Secondary | ICD-10-CM | POA: Diagnosis not present

## 2016-09-16 DIAGNOSIS — I4891 Unspecified atrial fibrillation: Secondary | ICD-10-CM | POA: Diagnosis not present

## 2016-09-17 DIAGNOSIS — M79675 Pain in left toe(s): Secondary | ICD-10-CM | POA: Diagnosis not present

## 2016-09-17 DIAGNOSIS — B351 Tinea unguium: Secondary | ICD-10-CM | POA: Diagnosis not present

## 2016-09-17 DIAGNOSIS — M79674 Pain in right toe(s): Secondary | ICD-10-CM | POA: Diagnosis not present

## 2016-09-17 DIAGNOSIS — R1310 Dysphagia, unspecified: Secondary | ICD-10-CM | POA: Diagnosis not present

## 2016-09-18 DIAGNOSIS — R1310 Dysphagia, unspecified: Secondary | ICD-10-CM | POA: Diagnosis not present

## 2016-09-19 DIAGNOSIS — I4891 Unspecified atrial fibrillation: Secondary | ICD-10-CM | POA: Diagnosis not present

## 2016-09-19 DIAGNOSIS — R791 Abnormal coagulation profile: Secondary | ICD-10-CM | POA: Diagnosis not present

## 2016-09-20 DIAGNOSIS — R1310 Dysphagia, unspecified: Secondary | ICD-10-CM | POA: Diagnosis not present

## 2016-09-21 DIAGNOSIS — R1310 Dysphagia, unspecified: Secondary | ICD-10-CM | POA: Diagnosis not present

## 2016-09-22 DIAGNOSIS — R1310 Dysphagia, unspecified: Secondary | ICD-10-CM | POA: Diagnosis not present

## 2016-09-23 DIAGNOSIS — R1310 Dysphagia, unspecified: Secondary | ICD-10-CM | POA: Diagnosis not present

## 2016-09-24 DIAGNOSIS — I10 Essential (primary) hypertension: Secondary | ICD-10-CM | POA: Diagnosis not present

## 2016-09-24 DIAGNOSIS — D72829 Elevated white blood cell count, unspecified: Secondary | ICD-10-CM | POA: Diagnosis not present

## 2016-09-24 DIAGNOSIS — Z7901 Long term (current) use of anticoagulants: Secondary | ICD-10-CM | POA: Diagnosis not present

## 2016-09-24 DIAGNOSIS — N179 Acute kidney failure, unspecified: Secondary | ICD-10-CM | POA: Diagnosis not present

## 2016-09-24 DIAGNOSIS — D649 Anemia, unspecified: Secondary | ICD-10-CM | POA: Diagnosis not present

## 2016-09-26 DIAGNOSIS — I4891 Unspecified atrial fibrillation: Secondary | ICD-10-CM | POA: Diagnosis not present

## 2016-09-26 DIAGNOSIS — Z7901 Long term (current) use of anticoagulants: Secondary | ICD-10-CM | POA: Diagnosis not present

## 2016-09-30 DIAGNOSIS — Z7901 Long term (current) use of anticoagulants: Secondary | ICD-10-CM | POA: Diagnosis not present

## 2016-09-30 DIAGNOSIS — I82421 Acute embolism and thrombosis of right iliac vein: Secondary | ICD-10-CM | POA: Diagnosis not present

## 2016-09-30 DIAGNOSIS — I4891 Unspecified atrial fibrillation: Secondary | ICD-10-CM | POA: Diagnosis not present

## 2016-10-01 ENCOUNTER — Encounter (HOSPITAL_COMMUNITY): Payer: Self-pay | Admitting: Psychiatry

## 2016-10-01 ENCOUNTER — Ambulatory Visit (INDEPENDENT_AMBULATORY_CARE_PROVIDER_SITE_OTHER): Payer: Medicare Other | Admitting: Psychiatry

## 2016-10-01 VITALS — BP 103/71 | HR 76

## 2016-10-01 DIAGNOSIS — Z818 Family history of other mental and behavioral disorders: Secondary | ICD-10-CM

## 2016-10-01 DIAGNOSIS — F0391 Unspecified dementia with behavioral disturbance: Secondary | ICD-10-CM

## 2016-10-01 DIAGNOSIS — F419 Anxiety disorder, unspecified: Secondary | ICD-10-CM

## 2016-10-01 DIAGNOSIS — F09 Unspecified mental disorder due to known physiological condition: Secondary | ICD-10-CM

## 2016-10-01 DIAGNOSIS — I10 Essential (primary) hypertension: Secondary | ICD-10-CM | POA: Diagnosis not present

## 2016-10-01 DIAGNOSIS — D72829 Elevated white blood cell count, unspecified: Secondary | ICD-10-CM | POA: Diagnosis not present

## 2016-10-01 DIAGNOSIS — F331 Major depressive disorder, recurrent, moderate: Secondary | ICD-10-CM | POA: Diagnosis not present

## 2016-10-01 DIAGNOSIS — D649 Anemia, unspecified: Secondary | ICD-10-CM | POA: Diagnosis not present

## 2016-10-01 DIAGNOSIS — N179 Acute kidney failure, unspecified: Secondary | ICD-10-CM | POA: Diagnosis not present

## 2016-10-01 DIAGNOSIS — Z7901 Long term (current) use of anticoagulants: Secondary | ICD-10-CM | POA: Diagnosis not present

## 2016-10-01 MED ORDER — RISPERIDONE 0.25 MG PO TABS: 0.2500 mg | ORAL_TABLET | Freq: Three times a day (TID) | ORAL | 5 refills | Status: AC

## 2016-10-01 MED ORDER — BUPROPION HCL ER (XL) 300 MG PO TB24: 300.0000 mg | ORAL_TABLET | ORAL | 5 refills | Status: AC

## 2016-10-01 MED ORDER — PAROXETINE HCL 40 MG PO TABS: 40.0000 mg | ORAL_TABLET | Freq: Every day | ORAL | 5 refills | Status: AC

## 2016-10-01 MED ORDER — RISPERIDONE 1 MG PO TABS: 1.0000 mg | ORAL_TABLET | Freq: Every day | ORAL | 5 refills | Status: AC

## 2016-10-01 MED ORDER — CLONAZEPAM 0.5 MG PO TABS: ORAL_TABLET | ORAL | 5 refills | Status: AC

## 2016-10-01 MED ORDER — LAMOTRIGINE 100 MG PO TABS: 100.0000 mg | ORAL_TABLET | Freq: Two times a day (BID) | ORAL | 5 refills | Status: AC

## 2016-10-01 NOTE — Progress Notes (Signed)
Patient ID: Amy Clay, female   DOB: Aug 15, 1940, 76 y.o.   MRN: [id number] Patient ID: Amy Clay, female   DOB: 1940-06-18, 76 y.o.   MRN: [id number] Patient ID: Amy Clay, female   DOB: 29-Oct-1940, 76 y.o.   MRN: [id number] Patient ID: Amy Clay, female   DOB: 1940-09-22, 76 y.o.   MRN: [id number] Patient ID: Amy Clay, female   DOB: [birth year], 76 y.o.   MRN: [id number] Patient ID: Amy Clay, female   DOB: [birth year], 76 y.o.   MRN: [id number] Patient ID: Amy Clay, female   DOB: 07/01/1940, 76 y.o.   MRN: [id number] Patient ID: Amy Clay, female   DOB: April 01, 1941, 76 y.o.   MRN: [id number] Patient ID: Amy Clay, female   DOB: 10/09/1940, 76 y.o.   MRN: [id number] Patient ID: Amy Clay, female   DOB: 22-Jan-1941, 76 y.o.   MRN: [id number] Patient ID: Amy Clay, female   DOB: [birth year], 76 y.o.   MRN: [id number] Patient ID: Amy Clay, female   DOB: March 07, 1941, 76 y.o.   MRN: [id number] Patient ID: Amy Clay, female   DOB: February 25, 1941, 76 y.o.   MRN: [id number] Patient ID: Amy Clay, female   DOB: December 06, 1940, 76 y.o.   MRN: [id number] [facility] [id number] Progress Note Amy Clay MRN: [id number] DOB: [birth year] Age: 76 y.o.  Date: 10/01/2016  Chief Complaint  Patient presents with  . Depression  . Anxiety  . Follow-up   History of presenting illness Patient is 76 year old Caucasian female who came with her sister for her followup appointment. She lives in the [facility] assisted care facility. She and her sister had been living together in the family farm outside of [place]. Neither one has ever been married or have had children. The patient worked until the early 80s when she began to have difficulties with her nerves.  Currently the patient is still struggling with her mental and physical health. She was admitted last year to a psychiatric hospital in [place] because she was anxious nervous and depressed. Last time  Dr. Lolly Mustache increased her Risperdal which is helped somewhat. She can't walk and it's not clear why much of it seems to be anxiety based and she seems to have a fear of falling. She's also recently developed possible renal failure and is can be referred to a nephrologist. She was on lithium for a number of years. We do not have her  recent records from her primary care physician who is outside of [place]  The patient returns after 4 months. She is here with her nursing assistant Lupita Leash. She states that she cannot talk today and her voice is very faint. Her sister [name] is with her and states that she has also moved to the same nursing home. She seems to recognize her sister and her aid. Both deny any recent agitation or calling out for help like she use to. Her mood seems to be stable and she is sleeping well. Her dementia is progressing as her voice is diminishing and she's having trouble at times with chewing and swallowing her food. She didn't denies any auditory or visual hallucinations but is sleeping more than she was in the past  Past psychiatric history Patient is seeing psychiatrist since 1997 in this office.  Her last psychiatric admission was at [facility] after having suicidal thoughts.  She denies any history of suicidal attempt however endorse history of hopeless feeling.  She is diagnosed with major depressive disorder. She she was very stable on lithium until  her creatinine went up and lithium was discontinued.  We had recently tried Lamictal and the dose has been increased recently.  In the past we had tried BuSpar which was discontinued at hospital.  She has another psychiatric inpatient treatment in 90s. She admitted history of mania, depression and psychosis.  Social history Patient lives in a nursing home.  She has no children.  Her husband died in nursing home.  Patient has no other family other than a sister who visits when she can  Medical history Patient was  recently admitted due to dehydration and UTI.  Patient has history of arthritis blood pressure and chronic pain. Her primary care physician is the provider for the nursing home  She takes medication for her blood pressure and arthritis.  She had history of jerking movements, fall and generalized pain. Her last blood work was done on 02/14/2012 which shows anemia, her creatinine was 1.33.  She was given antibiotic result UTI.  Her WBC count was 10.4.  Family History family history includes Anxiety disorder in her paternal aunt and paternal uncle; Bipolar disorder in her cousin.  ROS   Mental status examination Patient is casually dressed and fairly groomed. She is in a wheelchair. Her skin looks very thin and she has multiple bruises She described her mood as okay Her voice is high-pitched and soft.  .Her affect is fairly bright and she is able to smile with her sister present Her attention and concentration are poor today  She said very little today  She doesn't have auditory or visual hallucination.  There were no paranoia or delusion obsession present at this time.  Her fund of knowledge is poor. Her orientation is to person and situation  There are no tremors and shakes in her hand.  She's alert  Her insight judgment are poor and impulse control is okay.  Lab Results:  Results for orders placed or performed during the hospital encounter of 02/23/16 (from the past 8736 hour(s))  Urine culture   Collection Time: 02/23/16 10:25 PM  Result Value Ref Range   Specimen Description URINE, CATHETERIZED    Special Requests NONE    Culture >=100,000 COLONIES/mL ESCHERICHIA COLI (A)    Report Status 02/27/2016 FINAL    Organism ID, Bacteria ESCHERICHIA COLI (A)       Susceptibility   Escherichia coli - MIC*    AMPICILLIN >=32 RESISTANT Resistant     CEFAZOLIN <=4 SENSITIVE Sensitive     CEFTRIAXONE <=1 SENSITIVE Sensitive     CIPROFLOXACIN <=0.25 SENSITIVE Sensitive     GENTAMICIN <=1 SENSITIVE  Sensitive     IMIPENEM <=0.25 SENSITIVE Sensitive     NITROFURANTOIN <=16 SENSITIVE Sensitive     TRIMETH/SULFA <=20 SENSITIVE Sensitive     AMPICILLIN/SULBACTAM >=32 RESISTANT Resistant     PIP/TAZO <=4 SENSITIVE Sensitive     Extended ESBL NEGATIVE Sensitive     * >=100,000 COLONIES/mL ESCHERICHIA COLI  Urinalysis, Routine w reflex microscopic   Collection Time: 02/23/16 10:25 PM  Result Value Ref Range   Color, Urine YELLOW YELLOW   APPearance CLOUDY (A) CLEAR   Specific Gravity, Urine 1.020 1.005 - 1.030   pH 6.0 5.0 - 8.0   Glucose, UA NEGATIVE NEGATIVE mg/dL   Hgb urine dipstick SMALL (A) NEGATIVE   Bilirubin Urine NEGATIVE NEGATIVE   Ketones, ur NEGATIVE NEGATIVE mg/dL   Protein, ur NEGATIVE NEGATIVE mg/dL   Nitrite NEGATIVE NEGATIVE   Leukocytes, UA LARGE (A) NEGATIVE  Urine microscopic-add on  Collection Time: 02/23/16 10:25 PM  Result Value Ref Range   Squamous Epithelial / LPF NONE SEEN NONE SEEN   WBC, UA TOO NUMEROUS TO COUNT 0 - 5 WBC/hpf   RBC / HPF 6-30 0 - 5 RBC/hpf   Bacteria, UA MANY (A) NONE SEEN  Comprehensive metabolic panel   Collection Time: 02/23/16 10:48 PM  Result Value Ref Range   Sodium 136 135 - 145 mmol/L   Potassium 4.5 3.5 - 5.1 mmol/L   Chloride 103 101 - 111 mmol/L   CO2 28 22 - 32 mmol/L   Glucose, Bld 89 65 - 99 mg/dL   BUN 42 (H) 6 - 20 mg/dL   Creatinine, Ser 1.61 (H) 0.44 - 1.00 mg/dL   Calcium 9.2 8.9 - 09.6 mg/dL   Total Protein 6.2 (L) 6.5 - 8.1 g/dL   Albumin 2.8 (L) 3.5 - 5.0 g/dL   AST 18 15 - 41 U/L   ALT 12 (L) 14 - 54 U/L   Alkaline Phosphatase 81 38 - 126 U/L   Total Bilirubin 0.5 0.3 - 1.2 mg/dL   GFR calc non Af Amer 28 (L) >60 mL/min   GFR calc Af Amer 33 (L) >60 mL/min   Anion gap 5 5 - 15  Troponin I   Collection Time: 02/23/16 10:48 PM  Result Value Ref Range   Troponin I <0.03 <0.03 ng/mL  Lactic acid, plasma   Collection Time: 02/23/16 10:48 PM  Result Value Ref Range   Lactic Acid, Venous 1.1 0.5 - 1.9  mmol/L  CBC with Differential   Collection Time: 02/23/16 10:48 PM  Result Value Ref Range   WBC 6.1 4.0 - 10.5 K/uL   RBC 3.45 (L) 3.87 - 5.11 MIL/uL   Hemoglobin 10.5 (L) 12.0 - 15.0 g/dL   HCT 04.5 (L) 40.9 - 81.1 %   MCV 100.0 78.0 - 100.0 fL   MCH 30.4 26.0 - 34.0 pg   MCHC 30.4 30.0 - 36.0 g/dL   RDW 91.4 (H) 78.2 - 95.6 %   Platelets 218 150 - 400 K/uL   Neutrophils Relative % 61 %   Neutro Abs 3.8 1.7 - 7.7 K/uL   Lymphocytes Relative 23 %   Lymphs Abs 1.4 0.7 - 4.0 K/uL   Monocytes Relative 10 %   Monocytes Absolute 0.6 0.1 - 1.0 K/uL   Eosinophils Relative 6 %   Eosinophils Absolute 0.4 0.0 - 0.7 K/uL   Basophils Relative 0 %   Basophils Absolute 0.0 0.0 - 0.1 K/uL  Protime-INR   Collection Time: 02/23/16 10:48 PM  Result Value Ref Range   Prothrombin Time 12.6 11.4 - 15.2 seconds   INR 0.94   Lactic acid, plasma   Collection Time: 02/24/16  1:27 AM  Result Value Ref Range   Lactic Acid, Venous 1.3 0.5 - 1.9 mmol/L  CBC   Collection Time: 02/24/16  1:45 AM  Result Value Ref Range   WBC 6.6 4.0 - 10.5 K/uL   RBC 3.55 (L) 3.87 - 5.11 MIL/uL   Hemoglobin 11.0 (L) 12.0 - 15.0 g/dL   HCT 21.3 (L) 08.6 - 57.8 %   MCV 100.3 (H) 78.0 - 100.0 fL   MCH 31.0 26.0 - 34.0 pg   MCHC 30.9 30.0 - 36.0 g/dL   RDW 46.9 (H) 62.9 - 52.8 %   Platelets 217 150 - 400 K/uL  Creatinine, serum   Collection Time: 02/24/16  1:45 AM  Result Value Ref Range   Creatinine,  Ser 1.61 (H) 0.44 - 1.00 mg/dL   GFR calc non Af Amer 30 (L) >60 mL/min   GFR calc Af Amer 35 (L) >60 mL/min  TSH   Collection Time: 02/24/16  1:45 AM  Result Value Ref Range   TSH 0.846 0.350 - 4.500 uIU/mL  MRSA PCR Screening   Collection Time: 02/24/16  1:50 AM  Result Value Ref Range   MRSA by PCR NEGATIVE NEGATIVE  Glucose, capillary   Collection Time: 02/24/16  4:35 AM  Result Value Ref Range   Glucose-Capillary 90 65 - 99 mg/dL   Comment 1 Notify RN   Comprehensive metabolic panel   Collection Time:  02/24/16  5:44 AM  Result Value Ref Range   Sodium 140 135 - 145 mmol/L   Potassium 4.3 3.5 - 5.1 mmol/L   Chloride 105 101 - 111 mmol/L   CO2 31 22 - 32 mmol/L   Glucose, Bld 98 65 - 99 mg/dL   BUN 42 (H) 6 - 20 mg/dL   Creatinine, Ser 1.61 (H) 0.44 - 1.00 mg/dL   Calcium 9.3 8.9 - 09.6 mg/dL   Total Protein 5.8 (L) 6.5 - 8.1 g/dL   Albumin 2.7 (L) 3.5 - 5.0 g/dL   AST 14 (L) 15 - 41 U/L   ALT 11 (L) 14 - 54 U/L   Alkaline Phosphatase 85 38 - 126 U/L   Total Bilirubin 0.4 0.3 - 1.2 mg/dL   GFR calc non Af Amer 33 (L) >60 mL/min   GFR calc Af Amer 38 (L) >60 mL/min   Anion gap 4 (L) 5 - 15  CBC   Collection Time: 02/24/16  5:44 AM  Result Value Ref Range   WBC 5.3 4.0 - 10.5 K/uL   RBC 3.47 (L) 3.87 - 5.11 MIL/uL   Hemoglobin 10.3 (L) 12.0 - 15.0 g/dL   HCT 04.5 (L) 40.9 - 81.1 %   MCV 100.9 (H) 78.0 - 100.0 fL   MCH 29.7 26.0 - 34.0 pg   MCHC 29.4 (L) 30.0 - 36.0 g/dL   RDW 91.4 (H) 78.2 - 95.6 %   Platelets 224 150 - 400 K/uL  Glucose, capillary   Collection Time: 02/24/16  7:34 AM  Result Value Ref Range   Glucose-Capillary 77 65 - 99 mg/dL   Comment 1 Notify RN    Comment 2 Document in Chart   Glucose, capillary   Collection Time: 02/24/16 11:27 AM  Result Value Ref Range   Glucose-Capillary 98 65 - 99 mg/dL  Glucose, capillary   Collection Time: 02/24/16  4:22 PM  Result Value Ref Range   Glucose-Capillary 98 65 - 99 mg/dL  Glucose, capillary   Collection Time: 02/24/16  8:33 PM  Result Value Ref Range   Glucose-Capillary 125 (H) 65 - 99 mg/dL   Comment 1 Notify RN    Comment 2 Document in Chart   Basic metabolic panel   Collection Time: 02/25/16  6:44 AM  Result Value Ref Range   Sodium 139 135 - 145 mmol/L   Potassium 4.1 3.5 - 5.1 mmol/L   Chloride 109 101 - 111 mmol/L   CO2 27 22 - 32 mmol/L   Glucose, Bld 94 65 - 99 mg/dL   BUN 35 (H) 6 - 20 mg/dL   Creatinine, Ser 2.13 (H) 0.44 - 1.00 mg/dL   Calcium 8.8 (L) 8.9 - 10.3 mg/dL   GFR calc non Af  Amer 38 (L) >60 mL/min   GFR calc Af Denyse Dago  45 (L) >60 mL/min   Anion gap 3 (L) 5 - 15  CBC   Collection Time: 02/25/16  6:44 AM  Result Value Ref Range   WBC 4.1 4.0 - 10.5 K/uL   RBC 3.15 (L) 3.87 - 5.11 MIL/uL   Hemoglobin 9.7 (L) 12.0 - 15.0 g/dL   HCT 16.131.6 (L) 09.636.0 - 04.546.0 %   MCV 100.3 (H) 78.0 - 100.0 fL   MCH 30.8 26.0 - 34.0 pg   MCHC 30.7 30.0 - 36.0 g/dL   RDW 40.916.2 (H) 81.111.5 - 91.415.5 %   Platelets 203 150 - 400 K/uL  Glucose, capillary   Collection Time: 02/25/16  7:12 AM  Result Value Ref Range   Glucose-Capillary 96 65 - 99 mg/dL  Basic metabolic panel   Collection Time: 02/26/16  6:16 AM  Result Value Ref Range   Sodium 140 135 - 145 mmol/L   Potassium 4.1 3.5 - 5.1 mmol/L   Chloride 110 101 - 111 mmol/L   CO2 26 22 - 32 mmol/L   Glucose, Bld 91 65 - 99 mg/dL   BUN 34 (H) 6 - 20 mg/dL   Creatinine, Ser 7.821.35 (H) 0.44 - 1.00 mg/dL   Calcium 8.8 (L) 8.9 - 10.3 mg/dL   GFR calc non Af Amer 37 (L) >60 mL/min   GFR calc Af Amer 43 (L) >60 mL/min   Anion gap 4 (L) 5 - 15  CBC   Collection Time: 02/26/16  6:16 AM  Result Value Ref Range   WBC 3.8 (L) 4.0 - 10.5 K/uL   RBC 3.17 (L) 3.87 - 5.11 MIL/uL   Hemoglobin 9.4 (L) 12.0 - 15.0 g/dL   HCT 95.631.6 (L) 21.336.0 - 08.646.0 %   MCV 99.7 78.0 - 100.0 fL   MCH 29.7 26.0 - 34.0 pg   MCHC 29.7 (L) 30.0 - 36.0 g/dL   RDW 57.815.8 (H) 46.911.5 - 62.915.5 %   Platelets 231 150 - 400 K/uL   she had labs done last week with her primary care physician we will get the result Diagnoses 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 We'll continue Wellbutrin and Paxil for depression, Lamictal for mood stabilization, Risperdal for agitation and Cogentin for side effects from Risperdal. She'll take clonazepam  0.5 mg daily at 5 PM for at anxiety.    Time spent 15 minutes.  More than 50% of the time spent and psychoeducation, counseling and coordination of care. She'll return in 6 months  MEDICATIONS this encounter: Meds ordered this encounter  Medications  . Calcium Polycarbophil (CONCENTRATED FIBER PO)    Sig: Take 5 mg by mouth every 4 (four) hours as needed.  Marland Kitchen. HYDROcodone-acetaminophen (NORCO/VICODIN) 5-325 MG tablet    Sig: Take 1 tablet by mouth every 6 (six) hours as needed for moderate pain.  Marland Kitchen. risperiDONE (RISPERDAL) 1 MG tablet    Sig: Take 1 tablet (1 mg total) by mouth at bedtime.    Dispense:  30 tablet    Refill:  5  . risperiDONE (RISPERDAL) 0.25 MG tablet    Sig: Take 1 tablet (0.25 mg total) by mouth 3 (three) times daily.    Dispense:  90 tablet    Refill:  5  . PARoxetine (PAXIL) 40 MG tablet  Sig: Take 1 tablet (40 mg total) by mouth at bedtime.    Dispense:  30 tablet    Refill:  5  . lamoTRIgine (LAMICTAL) 100 MG tablet    Sig: Take 1 tablet (100 mg total) by mouth 2 (two) times daily.    Dispense:  60 tablet    Refill:  5  . buPROPion (WELLBUTRIN XL) 300 MG 24 hr tablet    Sig: Take 1 tablet (300 mg total) by mouth every morning.    Dispense:  30 tablet    Refill:  5  . clonazePAM (KLONOPIN) 0.5 MG tablet    Sig: Take daily at 5 pm    Dispense:  30 tablet    Refill:  5    Medical Decision Making Problem Points:  Established problem, stable/improving (1), Established problem, worsening (2), New problem, with additional work-up planned (4), Review of last therapy session (1) and Review of psycho-social stressors (1) Data Points:  Review of medication regiment & side effects (2) Review of new medications or change in dosage (2)  I certify that outpatient services furnished can reasonably be expected to improve the patient's condition.   Diannia Ruder, MD

## 2016-10-03 ENCOUNTER — Telehealth (HOSPITAL_COMMUNITY): Payer: Self-pay | Admitting: *Deleted

## 2016-10-03 DIAGNOSIS — Z7901 Long term (current) use of anticoagulants: Secondary | ICD-10-CM | POA: Diagnosis not present

## 2016-10-03 DIAGNOSIS — I4891 Unspecified atrial fibrillation: Secondary | ICD-10-CM | POA: Diagnosis not present

## 2016-10-03 DIAGNOSIS — I82421 Acute embolism and thrombosis of right iliac vein: Secondary | ICD-10-CM | POA: Diagnosis not present

## 2016-10-03 NOTE — Telephone Encounter (Signed)
Jones Balesavita Myers [facility] called stating they received a script for pt Klonopin for QD at 5pm. Per nurse Izola PriceMyers, they have been giving pt her Klonopin BID. [facility] nurses would like to know what SIG to give pt. Their number is [phone] and ask for [place]o AyeHall nurse.

## 2016-10-03 NOTE — Telephone Encounter (Signed)
Both are noted in the records but if it helps her to take it bid, please call pharmacy to increase qyantity to 60 per month

## 2016-10-04 ENCOUNTER — Telehealth (HOSPITAL_COMMUNITY): Payer: Self-pay

## 2016-10-04 ENCOUNTER — Telehealth (HOSPITAL_COMMUNITY): Payer: Self-pay | Admitting: *Deleted

## 2016-10-04 NOTE — Telephone Encounter (Signed)
[facility] with [facility] called stating they received a script for pt Klonopin for QD at 5pm. Per nurse Amy Clay, they have been giving pt her Klonopin BID. [facility] nurses would like to know what SIG to give pt. Their number is [phone] and ask for Amy Clay nurse. Per Dr. Tenny Crawoss, Both are noted in the records but if it helps her to take it bid. Called [facility] and spoke with Amy SheldonAshley the nurse and informed her with what Dr. Tenny Crawoss stated about the BID for pt Klonopin and Amy Sheldonshley verbalized understanding and stated okay thank you.

## 2016-10-04 NOTE — Telephone Encounter (Signed)
Called [facility] and spoke with Morrie SheldonAshley and informed her with what provider stated and she verbalized understanding.

## 2016-10-07 DIAGNOSIS — E46 Unspecified protein-calorie malnutrition: Secondary | ICD-10-CM | POA: Diagnosis not present

## 2016-10-07 DIAGNOSIS — I82409 Acute embolism and thrombosis of unspecified deep veins of unspecified lower extremity: Secondary | ICD-10-CM | POA: Diagnosis not present

## 2016-10-07 DIAGNOSIS — R627 Adult failure to thrive: Secondary | ICD-10-CM | POA: Diagnosis not present

## 2016-10-08 DIAGNOSIS — R319 Hematuria, unspecified: Secondary | ICD-10-CM | POA: Diagnosis not present

## 2016-10-08 DIAGNOSIS — Z79899 Other long term (current) drug therapy: Secondary | ICD-10-CM | POA: Diagnosis not present

## 2016-10-08 DIAGNOSIS — N390 Urinary tract infection, site not specified: Secondary | ICD-10-CM | POA: Diagnosis not present

## 2016-10-08 DIAGNOSIS — I4891 Unspecified atrial fibrillation: Secondary | ICD-10-CM | POA: Diagnosis not present

## 2016-10-08 DIAGNOSIS — I82421 Acute embolism and thrombosis of right iliac vein: Secondary | ICD-10-CM | POA: Diagnosis not present

## 2016-10-08 DIAGNOSIS — Z7901 Long term (current) use of anticoagulants: Secondary | ICD-10-CM | POA: Diagnosis not present

## 2016-10-10 DIAGNOSIS — R627 Adult failure to thrive: Secondary | ICD-10-CM | POA: Diagnosis not present

## 2016-10-10 DIAGNOSIS — Z7901 Long term (current) use of anticoagulants: Secondary | ICD-10-CM | POA: Diagnosis not present

## 2016-10-10 DIAGNOSIS — I4891 Unspecified atrial fibrillation: Secondary | ICD-10-CM | POA: Diagnosis not present

## 2016-10-10 DIAGNOSIS — N390 Urinary tract infection, site not specified: Secondary | ICD-10-CM | POA: Diagnosis not present

## 2016-10-10 DIAGNOSIS — N179 Acute kidney failure, unspecified: Secondary | ICD-10-CM | POA: Diagnosis not present

## 2016-10-12 ENCOUNTER — Encounter (HOSPITAL_COMMUNITY): Payer: Self-pay | Admitting: Emergency Medicine

## 2016-10-12 ENCOUNTER — Emergency Department (HOSPITAL_COMMUNITY)
Admission: EM | Admit: 2016-10-12 | Discharge: 2016-10-12 | Disposition: A | Discharge status: Skilled Nursing Facility | Payer: Medicare Other | Attending: Emergency Medicine | Admitting: Emergency Medicine

## 2016-10-12 DIAGNOSIS — I129 Hypertensive chronic kidney disease with stage 1 through stage 4 chronic kidney disease, or unspecified chronic kidney disease: Secondary | ICD-10-CM | POA: Insufficient documentation

## 2016-10-12 DIAGNOSIS — Z79899 Other long term (current) drug therapy: Secondary | ICD-10-CM | POA: Diagnosis not present

## 2016-10-12 DIAGNOSIS — N183 Chronic kidney disease, stage 3 (moderate): Secondary | ICD-10-CM | POA: Insufficient documentation

## 2016-10-12 DIAGNOSIS — Z743 Need for continuous supervision: Secondary | ICD-10-CM | POA: Diagnosis not present

## 2016-10-12 DIAGNOSIS — N390 Urinary tract infection, site not specified: Secondary | ICD-10-CM | POA: Diagnosis not present

## 2016-10-12 DIAGNOSIS — R509 Fever, unspecified: Secondary | ICD-10-CM | POA: Diagnosis not present

## 2016-10-12 DIAGNOSIS — Z7901 Long term (current) use of anticoagulants: Secondary | ICD-10-CM | POA: Insufficient documentation

## 2016-10-12 DIAGNOSIS — R404 Transient alteration of awareness: Secondary | ICD-10-CM | POA: Diagnosis not present

## 2016-10-12 DIAGNOSIS — R279 Unspecified lack of coordination: Secondary | ICD-10-CM | POA: Diagnosis not present

## 2016-10-12 DIAGNOSIS — D649 Anemia, unspecified: Secondary | ICD-10-CM | POA: Diagnosis not present

## 2016-10-12 DIAGNOSIS — F0390 Unspecified dementia without behavioral disturbance: Secondary | ICD-10-CM | POA: Insufficient documentation

## 2016-10-12 MED ORDER — SODIUM CHLORIDE 0.9 % IV SOLN
INTRAVENOUS | Status: AC
  Filled 2016-10-12: qty 500

## 2016-10-12 MED ORDER — SODIUM CHLORIDE 0.9 % IV SOLN
500.0000 mg | Freq: Once | INTRAVENOUS | Status: AC
  Administered 2016-10-12: 500 mg via INTRAVENOUS

## 2016-10-12 NOTE — Discharge Instructions (Signed)
Make arrangements for PICC line to be inserted so you can continue your antibiotics.

## 2016-10-12 NOTE — ED Triage Notes (Signed)
Pt with AMS sent here from [facility] d/t inability to obtain new IV antibiotic (Imipenem) to treat newly diagnosed  UTI. Per EMS, POA of family wanted pt transferred here to receive the medicine tonight.

## 2016-10-12 NOTE — ED Notes (Signed)
Pt came from [facility] with an IV in Right AC that was infiltrated. D/C IV and when removing dressing, pt had small skin tear. Non-stick dsg applied to skin tear and wrapped in kling.

## 2016-10-12 NOTE — ED Provider Notes (Signed)
[facility] Provider Note   CSN: [id number] Arrival date & time: 10/12/16  0035     History   Chief Complaint Chief Complaint  Patient presents with  . IV Medication    HPI Amy Clay is a 76 y.o. female.  The history is provided by the nursing home and a relative. The history is limited by the condition of the patient (Dementia).  She was sent from the nursing home where she resides because she had a urine culture showing an infection. Infection is with a gram-negative rod which is sensitive to some cephalosporins, but patient is allergic to cephalosporin. It is also sensitive to proper so on, but she is allergic to ampicillin. Nursing home and made decision to treated with imipenem, but were not able to get the medication tonight. She was transferred here to get the medication. Apparently, she has had a general decline in functional status over the last week and this was felt to be due to her urinary tract infection. She has had multiple urinary tract infections in the past. She is DO NOT RESUSCITATE.  Past Medical History:  Diagnosis Date  . Altered mental status   . Anxiety   . Arthritis   . Bipolar 1 disorder (HCC)   . Chronic kidney disease   . Dementia   . Depression   . Dysphagia   . GERD (gastroesophageal reflux disease)   . Hemorrhoids   . High blood pressure   . Hyperlipidemia   . Hypoglycemia   . Localized edema   . Osteoarthritis   . Thrombocytopenia (HCC)   . Weakness     Patient Active Problem List   Diagnosis Date Noted  . Pressure injury of skin 02/24/2016  . Pressure ulcer 07/16/2015  . Lower urinary tract infectious disease 07/15/2015  . DVT (deep venous thrombosis), unspecified laterality 03/24/2015  . Heel ulcer (HCC) 03/24/2015  . Hematoma of abdominal wall 08/20/2014  . Hypoglycemia 08/20/2014  . Morbid obesity (HCC) 08/20/2014  . Thrombocytopenia (HCC) 08/20/2014  . Altered mental status 08/19/2014  . Altered mental state  08/19/2014  . CKD (chronic kidney disease) stage 3, GFR 30-59 ml/min 08/19/2014  . Bilateral lower extremity edema 08/19/2014  . Hypothermia 08/19/2014  . Dementia 04/20/2014  . Benign essential HTN 04/20/2014  . Physical deconditioning   . Acute on chronic renal failure (HCC) 02/10/2013  . Anemia 02/10/2013  . Fall 02/09/2013  . Ingrown right big toenail 12/30/2012  . 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 Syncope 02/13/2012  . Constipation 10/01/2011  . Dysphagia 10/01/2011  . Abdominal pain 10/01/2011  . Umbilical hernia 10/01/2011  . Syncope and collapse 09/15/2011  . Major depression 09/15/2011  . Arthritis 09/15/2011  . Rotator cuff syndrome of right shoulder 11/13/2010  . CELLULITIS/ABSCESS, LEG 03/03/2007  . History of cardiovascular disorder 02/13/2007    Past Surgical History:  Procedure Laterality Date  . ABDOMINAL HYSTERECTOMY    . ANKLE SURGERY  04/28/97   right ankle fracture  . BACK SURGERY  03/16/87  . bladder tacked  04/04/95  . BREAST BIOPSY  05/16/95   right side  . CHOLECYSTECTOMY  08/15/93  . COLONOSCOPY  10/26/2002   Dr. Karilyn Cotaehman- small external hemorrhoids otherwise normal   . MALONEY DILATION  10/31/2011   Procedure: Elease HashimotoMALONEY DILATION;  Surgeon: Corbin Adeobert M Rourk, MD;  Location: [facility];  Service: Endoscopy;  Laterality: N/A;  . PARTIAL HYSTERECTOMY  04/04/95  . SAVORY DILATION  10/31/2011   Procedure: SAVORY DILATION;  Surgeon: Corbin Ade, MD;  Location: [facility];  Service: Endoscopy;  Laterality: N/A;  . WRIST SURGERY  02/24/09   right wrist fracture    OB History    Gravida Para Term Preterm AB Living             0   SAB TAB Ectopic Multiple Live Births                   Home Medications    Prior to Admission medications   Medication Sig Start Date End Date Taking? Authorizing Provider  acetaminophen (TYLENOL) 325 MG tablet Take 650 mg by mouth every 6 (six) hours as needed for mild pain or moderate pain.    [provider]  alum & mag hydroxide-simeth (MAALOX PLUS) 400-400-40 MG/5ML suspension Take by mouth every 6 (six) hours as needed for indigestion.    [provider]  Amino Acids-Protein Hydrolys (FEEDING SUPPLEMENT, PRO-STAT SUGAR FREE 64,) LIQD Take 30 mLs by mouth 2 (two) times daily.    [provider]  aspirin EC 81 MG tablet Take 81 mg by mouth every morning.     [provider]  benztropine (COGENTIN) 0.5 MG tablet Take 1 tablet (0.5 mg total) by mouth 2 (two) times daily. 06/11/16   Myrlene Broker, MD  bisacodyl (DULCOLAX) 5 MG EC tablet Take 10 mg by mouth every 8 (eight) hours as needed for moderate constipation.    [provider]  buPROPion (WELLBUTRIN XL) 300 MG 24 hr tablet Take 1 tablet (300 mg total) by mouth every morning. 10/01/16 10/01/17  Myrlene Broker, MD  Calcium Carb-Vit D-C-E-Mineral (OS-CAL ULTRA) 600 MG TABS Take by mouth 2 (two) times daily.    [provider]  Calcium Carbonate (CALCIUM 600 PO) Take 600 mg by mouth daily.    [provider]  Calcium Polycarbophil (CONCENTRATED FIBER PO) Take 5 mg by mouth every 4 (four) hours as needed.    [provider]  cefUROXime (CEFTIN) 500 MG tablet Take 1 tablet (500 mg total) by mouth 2 (two) times daily with a meal. 02/26/16   Clydia Llano, MD  Cholecalciferol (VITAMIN D) 2000 UNITS CAPS Take 1 capsule by mouth every morning.     [provider]  clonazePAM Scarlette Calico) 0.5 MG tablet Take daily at 5 pm 10/01/16   Myrlene Broker, MD  donepezil (ARICEPT) 5 MG tablet Take 1 tablet (5 mg total) by mouth at bedtime. 06/11/16 06/11/17  Myrlene Broker, MD  furosemide (LASIX) 20 MG tablet Take 20 mg by mouth every Monday, Wednesday, and Friday.     [provider]  guaifenesin (HUMIBID E) 400 MG TABS tablet Take 400 mg by mouth 2 (two) times daily.    [provider]  HYDROcodone-acetaminophen (NORCO/VICODIN) 5-325 MG tablet Take 1 tablet by mouth  every 6 (six) hours as needed for moderate pain.    [provider]  ipratropium-albuterol (DUONEB) 0.5-2.5 (3) MG/3ML SOLN Take 3 mLs by nebulization every 6 (six) hours as needed (for cough).    [provider]  ketoconazole (NIZORAL) 2 % shampoo Apply 1 application topically 2 (two) times a week. Every Sunday and Wednesday for dry scalp    [provider]  lamoTRIgine (LAMICTAL) 100 MG tablet Take 1 tablet (100 mg total) by mouth 2 (two) times daily. 10/01/16   Myrlene Broker, MD  loratadine (CLARITIN) 10 MG tablet Take 10 mg by mouth daily.  [provider]  Multiple Vitamin (MULTIVITAMIN WITH MINERALS) TABS tablet Take 1 tablet by mouth daily.    [provider]  omeprazole (PRILOSEC) 20 MG capsule Take 20 mg by mouth at bedtime.     [provider]  ondansetron (ZOFRAN) 4 MG tablet Take 4 mg by mouth every 8 (eight) hours as needed for nausea or vomiting.    [provider]  oxybutynin (DITROPAN-XL) 10 MG 24 hr tablet Take 10 mg by mouth daily.     [provider]  PARoxetine (PAXIL) 40 MG tablet Take 1 tablet (40 mg total) by mouth at bedtime. 10/01/16 10/01/17  Myrlene Broker, MD  polyethylene glycol [facility] Ethelene Hal) packet Take 17 g by mouth daily.     [provider]  risperiDONE (RISPERDAL) 0.25 MG tablet Take 1 tablet (0.25 mg total) by mouth 3 (three) times daily. 10/01/16   Myrlene Broker, MD  risperiDONE (RISPERDAL) 1 MG tablet Take 1 tablet (1 mg total) by mouth at bedtime. 10/01/16   Myrlene Broker, MD  senna (SENOKOT) 8.6 MG TABS tablet Take 1 tablet by mouth 2 (two) times daily.    [provider]  solifenacin (VESICARE) 10 MG tablet Take 10 mg by mouth daily.    [provider]  terazosin (HYTRIN) 2 MG capsule Take 2 mg by mouth at bedtime.     [provider]  Warfarin Sodium (COUMADIN PO) Take 7 mg by mouth daily.    [provider]    Family  History Family History  Problem Relation Age of Onset  . Arthritis Unknown   . Asthma Unknown   . Diabetes Unknown   . Anxiety disorder Paternal Aunt   . Anxiety disorder Paternal Uncle   . Bipolar disorder Cousin   . Colon cancer Neg Hx     Social History Social History  Substance Use Topics  . Smoking status: Never Smoker  . Smokeless tobacco: Never Used  . Alcohol use No     Allergies   Benzodiazepines; Ciprofloxacin; Cephalexin; and Penicillins   Review of Systems Review of Systems  Unable to perform ROS: Dementia     Physical Exam Updated Vital Signs BP 103/63   Pulse 82   Resp 16   Wt 84 kg (185 lb 4 oz)   SpO2 95%   BMI 29.90 kg/m   Physical Exam  Nursing note and vitals reviewed.  76 year old female, poorly responsive, and in no acute distress. Vital signs are normal. Oxygen saturation is 95%, which is normal. Head is normocephalic and atraumatic. PERRLA, EOMI. Oropharynx is clear. Neck is nontender and supple without adenopathy or JVD. Back is nontender and there is no CVA tenderness. Lungs are clear without rales, wheezes, or rhonchi. Chest is nontender. Heart has regular rate and rhythm without murmur. Abdomen is soft, flat, nontender without masses or hepatosplenomegaly and peristalsis is normoactive. Extremities have no cyanosis or edema, full range of motion is present. Skin is warm and dry without rash. Neurologic: She is somnolent but arousable, nonverbal when a rales. She does have purposeful movement. There are no focal neurologic findings.  ED Treatments / Results   Procedures Procedures (including critical care time)  Medications Ordered in ED Medications  imipenem-cilastatin (PRIMAXIN) 500 mg in sodium chloride 0.9 % 100 mL IVPB (0 mg Intravenous Stopped 10/12/16 0245)     Initial Impression / Assessment and Plan / ED Course  I have reviewed the triage vital signs and the  nursing notes.  Urinary tract infection with patient  transferred here to get initial dose of imipenem. Old records are reviewed, and she has no relevant past visits. She has very limited options for IV access. We will attempt to start an IV and give her her initial dose. Following that, will return to nursing home where elective placement of PICC line can be accomplished.  Final Clinical Impressions(s) / ED Diagnoses   Final diagnoses:  Urinary tract infection without hematuria, site unspecified    New Prescriptions New Prescriptions   No medications on file     Dione Booze, MD 10/12/16 [phone]

## 2016-10-14 DIAGNOSIS — Z7901 Long term (current) use of anticoagulants: Secondary | ICD-10-CM | POA: Diagnosis not present

## 2016-10-14 DIAGNOSIS — I4891 Unspecified atrial fibrillation: Secondary | ICD-10-CM | POA: Diagnosis not present

## 2016-10-14 DIAGNOSIS — D649 Anemia, unspecified: Secondary | ICD-10-CM | POA: Diagnosis not present

## 2016-10-15 DIAGNOSIS — R627 Adult failure to thrive: Secondary | ICD-10-CM | POA: Diagnosis not present

## 2016-10-15 DIAGNOSIS — I82409 Acute embolism and thrombosis of unspecified deep veins of unspecified lower extremity: Secondary | ICD-10-CM | POA: Diagnosis not present

## 2016-10-15 DIAGNOSIS — D649 Anemia, unspecified: Secondary | ICD-10-CM | POA: Diagnosis not present

## 2016-10-15 DIAGNOSIS — N390 Urinary tract infection, site not specified: Secondary | ICD-10-CM | POA: Diagnosis not present

## 2016-10-21 DIAGNOSIS — R791 Abnormal coagulation profile: Secondary | ICD-10-CM | POA: Diagnosis not present

## 2016-10-21 DIAGNOSIS — Z7901 Long term (current) use of anticoagulants: Secondary | ICD-10-CM | POA: Diagnosis not present

## 2016-10-21 DIAGNOSIS — R627 Adult failure to thrive: Secondary | ICD-10-CM | POA: Diagnosis not present

## 2016-10-21 DIAGNOSIS — I4891 Unspecified atrial fibrillation: Secondary | ICD-10-CM | POA: Diagnosis not present

## 2016-10-21 DIAGNOSIS — N390 Urinary tract infection, site not specified: Secondary | ICD-10-CM | POA: Diagnosis not present

## 2016-10-21 DIAGNOSIS — D649 Anemia, unspecified: Secondary | ICD-10-CM | POA: Diagnosis not present

## 2016-10-22 DIAGNOSIS — D649 Anemia, unspecified: Secondary | ICD-10-CM | POA: Diagnosis not present

## 2016-10-22 DIAGNOSIS — R7989 Other specified abnormal findings of blood chemistry: Secondary | ICD-10-CM | POA: Diagnosis not present

## 2016-10-24 DIAGNOSIS — R21 Rash and other nonspecific skin eruption: Secondary | ICD-10-CM | POA: Diagnosis not present

## 2016-10-24 DIAGNOSIS — R627 Adult failure to thrive: Secondary | ICD-10-CM | POA: Diagnosis not present

## 2016-10-24 DIAGNOSIS — L89609 Pressure ulcer of unspecified heel, unspecified stage: Secondary | ICD-10-CM | POA: Diagnosis not present

## 2016-10-24 DIAGNOSIS — I4891 Unspecified atrial fibrillation: Secondary | ICD-10-CM | POA: Diagnosis not present

## 2016-10-24 DIAGNOSIS — Z7901 Long term (current) use of anticoagulants: Secondary | ICD-10-CM | POA: Diagnosis not present

## 2016-10-24 DIAGNOSIS — E46 Unspecified protein-calorie malnutrition: Secondary | ICD-10-CM | POA: Diagnosis not present

## 2016-10-24 DIAGNOSIS — T148XXD Other injury of unspecified body region, subsequent encounter: Secondary | ICD-10-CM | POA: Diagnosis not present

## 2016-10-24 DIAGNOSIS — R7989 Other specified abnormal findings of blood chemistry: Secondary | ICD-10-CM | POA: Diagnosis not present

## 2016-10-24 DIAGNOSIS — D649 Anemia, unspecified: Secondary | ICD-10-CM | POA: Diagnosis not present

## 2016-10-25 DIAGNOSIS — E46 Unspecified protein-calorie malnutrition: Secondary | ICD-10-CM | POA: Diagnosis not present

## 2016-10-26 DIAGNOSIS — E46 Unspecified protein-calorie malnutrition: Secondary | ICD-10-CM | POA: Diagnosis not present

## 2016-10-27 DIAGNOSIS — E46 Unspecified protein-calorie malnutrition: Secondary | ICD-10-CM | POA: Diagnosis not present

## 2016-10-28 DIAGNOSIS — E46 Unspecified protein-calorie malnutrition: Secondary | ICD-10-CM | POA: Diagnosis not present

## 2016-10-28 DIAGNOSIS — R21 Rash and other nonspecific skin eruption: Secondary | ICD-10-CM | POA: Diagnosis not present

## 2016-10-28 DIAGNOSIS — R627 Adult failure to thrive: Secondary | ICD-10-CM | POA: Diagnosis not present

## 2016-10-29 DIAGNOSIS — E46 Unspecified protein-calorie malnutrition: Secondary | ICD-10-CM | POA: Diagnosis not present

## 2016-10-30 DIAGNOSIS — E46 Unspecified protein-calorie malnutrition: Secondary | ICD-10-CM | POA: Diagnosis not present

## 2016-10-31 DIAGNOSIS — E46 Unspecified protein-calorie malnutrition: Secondary | ICD-10-CM | POA: Diagnosis not present

## 2016-11-01 DIAGNOSIS — R627 Adult failure to thrive: Secondary | ICD-10-CM | POA: Diagnosis not present

## 2016-11-01 DIAGNOSIS — M6281 Muscle weakness (generalized): Secondary | ICD-10-CM | POA: Diagnosis not present

## 2016-11-01 DIAGNOSIS — E46 Unspecified protein-calorie malnutrition: Secondary | ICD-10-CM | POA: Diagnosis not present

## 2016-11-01 DIAGNOSIS — R21 Rash and other nonspecific skin eruption: Secondary | ICD-10-CM | POA: Diagnosis not present

## 2016-11-02 DIAGNOSIS — E46 Unspecified protein-calorie malnutrition: Secondary | ICD-10-CM | POA: Diagnosis not present

## 2016-11-03 DIAGNOSIS — E46 Unspecified protein-calorie malnutrition: Secondary | ICD-10-CM | POA: Diagnosis not present

## 2016-11-04 DIAGNOSIS — R21 Rash and other nonspecific skin eruption: Secondary | ICD-10-CM | POA: Diagnosis not present

## 2016-11-04 DIAGNOSIS — R627 Adult failure to thrive: Secondary | ICD-10-CM | POA: Diagnosis not present

## 2016-11-04 DIAGNOSIS — E46 Unspecified protein-calorie malnutrition: Secondary | ICD-10-CM | POA: Diagnosis not present

## 2016-11-05 DIAGNOSIS — E46 Unspecified protein-calorie malnutrition: Secondary | ICD-10-CM | POA: Diagnosis not present

## 2016-11-06 DIAGNOSIS — E46 Unspecified protein-calorie malnutrition: Secondary | ICD-10-CM | POA: Diagnosis not present

## 2016-11-07 DIAGNOSIS — E46 Unspecified protein-calorie malnutrition: Secondary | ICD-10-CM | POA: Diagnosis not present

## 2016-11-08 DIAGNOSIS — E46 Unspecified protein-calorie malnutrition: Secondary | ICD-10-CM | POA: Diagnosis not present

## 2016-11-09 DIAGNOSIS — E46 Unspecified protein-calorie malnutrition: Secondary | ICD-10-CM | POA: Diagnosis not present

## 2016-11-10 DIAGNOSIS — E46 Unspecified protein-calorie malnutrition: Secondary | ICD-10-CM | POA: Diagnosis not present

## 2016-11-11 DIAGNOSIS — E46 Unspecified protein-calorie malnutrition: Secondary | ICD-10-CM | POA: Diagnosis not present

## 2016-11-12 DIAGNOSIS — E46 Unspecified protein-calorie malnutrition: Secondary | ICD-10-CM | POA: Diagnosis not present

## 2016-11-13 DIAGNOSIS — E46 Unspecified protein-calorie malnutrition: Secondary | ICD-10-CM | POA: Diagnosis not present

## 2016-11-14 DIAGNOSIS — E46 Unspecified protein-calorie malnutrition: Secondary | ICD-10-CM | POA: Diagnosis not present

## 2016-11-15 DIAGNOSIS — E46 Unspecified protein-calorie malnutrition: Secondary | ICD-10-CM | POA: Diagnosis not present

## 2016-11-16 DIAGNOSIS — E46 Unspecified protein-calorie malnutrition: Secondary | ICD-10-CM | POA: Diagnosis not present

## 2016-11-17 DIAGNOSIS — E46 Unspecified protein-calorie malnutrition: Secondary | ICD-10-CM | POA: Diagnosis not present

## 2016-11-18 DIAGNOSIS — E46 Unspecified protein-calorie malnutrition: Secondary | ICD-10-CM | POA: Diagnosis not present

## 2016-11-19 DIAGNOSIS — E46 Unspecified protein-calorie malnutrition: Secondary | ICD-10-CM | POA: Diagnosis not present

## 2016-11-20 DIAGNOSIS — E46 Unspecified protein-calorie malnutrition: Secondary | ICD-10-CM | POA: Diagnosis not present

## 2016-11-21 DIAGNOSIS — E46 Unspecified protein-calorie malnutrition: Secondary | ICD-10-CM | POA: Diagnosis not present

## 2016-11-21 DIAGNOSIS — R627 Adult failure to thrive: Secondary | ICD-10-CM | POA: Diagnosis not present

## 2016-11-22 DIAGNOSIS — E46 Unspecified protein-calorie malnutrition: Secondary | ICD-10-CM | POA: Diagnosis not present

## 2016-11-23 DIAGNOSIS — E46 Unspecified protein-calorie malnutrition: Secondary | ICD-10-CM | POA: Diagnosis not present

## 2016-11-24 DIAGNOSIS — E46 Unspecified protein-calorie malnutrition: Secondary | ICD-10-CM | POA: Diagnosis not present

## 2016-11-25 DIAGNOSIS — E46 Unspecified protein-calorie malnutrition: Secondary | ICD-10-CM | POA: Diagnosis not present

## 2016-11-26 DIAGNOSIS — E46 Unspecified protein-calorie malnutrition: Secondary | ICD-10-CM | POA: Diagnosis not present

## 2016-11-27 DIAGNOSIS — E46 Unspecified protein-calorie malnutrition: Secondary | ICD-10-CM | POA: Diagnosis not present

## 2016-11-28 DIAGNOSIS — E46 Unspecified protein-calorie malnutrition: Secondary | ICD-10-CM | POA: Diagnosis not present

## 2016-11-29 DIAGNOSIS — E46 Unspecified protein-calorie malnutrition: Secondary | ICD-10-CM | POA: Diagnosis not present

## 2016-11-30 DIAGNOSIS — E46 Unspecified protein-calorie malnutrition: Secondary | ICD-10-CM | POA: Diagnosis not present

## 2016-12-01 DIAGNOSIS — E46 Unspecified protein-calorie malnutrition: Secondary | ICD-10-CM | POA: Diagnosis not present

## 2016-12-02 DIAGNOSIS — E46 Unspecified protein-calorie malnutrition: Secondary | ICD-10-CM | POA: Diagnosis not present

## 2016-12-03 DIAGNOSIS — E46 Unspecified protein-calorie malnutrition: Secondary | ICD-10-CM | POA: Diagnosis not present

## 2016-12-04 DIAGNOSIS — E46 Unspecified protein-calorie malnutrition: Secondary | ICD-10-CM | POA: Diagnosis not present

## 2016-12-05 DIAGNOSIS — E46 Unspecified protein-calorie malnutrition: Secondary | ICD-10-CM | POA: Diagnosis not present

## 2016-12-05 DIAGNOSIS — B351 Tinea unguium: Secondary | ICD-10-CM | POA: Diagnosis not present

## 2016-12-05 DIAGNOSIS — M79674 Pain in right toe(s): Secondary | ICD-10-CM | POA: Diagnosis not present

## 2016-12-05 DIAGNOSIS — M79675 Pain in left toe(s): Secondary | ICD-10-CM | POA: Diagnosis not present

## 2016-12-06 DIAGNOSIS — E46 Unspecified protein-calorie malnutrition: Secondary | ICD-10-CM | POA: Diagnosis not present

## 2016-12-07 DIAGNOSIS — E46 Unspecified protein-calorie malnutrition: Secondary | ICD-10-CM | POA: Diagnosis not present

## 2016-12-08 DIAGNOSIS — E46 Unspecified protein-calorie malnutrition: Secondary | ICD-10-CM | POA: Diagnosis not present

## 2016-12-09 DIAGNOSIS — E46 Unspecified protein-calorie malnutrition: Secondary | ICD-10-CM | POA: Diagnosis not present

## 2016-12-10 DIAGNOSIS — E46 Unspecified protein-calorie malnutrition: Secondary | ICD-10-CM | POA: Diagnosis not present

## 2016-12-11 DIAGNOSIS — E46 Unspecified protein-calorie malnutrition: Secondary | ICD-10-CM | POA: Diagnosis not present

## 2016-12-12 DIAGNOSIS — E46 Unspecified protein-calorie malnutrition: Secondary | ICD-10-CM | POA: Diagnosis not present

## 2016-12-13 DIAGNOSIS — E46 Unspecified protein-calorie malnutrition: Secondary | ICD-10-CM | POA: Diagnosis not present

## 2016-12-14 DIAGNOSIS — E46 Unspecified protein-calorie malnutrition: Secondary | ICD-10-CM | POA: Diagnosis not present

## 2016-12-15 DIAGNOSIS — E46 Unspecified protein-calorie malnutrition: Secondary | ICD-10-CM | POA: Diagnosis not present

## 2016-12-16 DIAGNOSIS — E46 Unspecified protein-calorie malnutrition: Secondary | ICD-10-CM | POA: Diagnosis not present

## 2016-12-16 DIAGNOSIS — D696 Thrombocytopenia, unspecified: Secondary | ICD-10-CM | POA: Diagnosis not present

## 2016-12-16 DIAGNOSIS — R627 Adult failure to thrive: Secondary | ICD-10-CM | POA: Diagnosis not present

## 2016-12-17 DIAGNOSIS — R7989 Other specified abnormal findings of blood chemistry: Secondary | ICD-10-CM | POA: Diagnosis not present

## 2016-12-17 DIAGNOSIS — D649 Anemia, unspecified: Secondary | ICD-10-CM | POA: Diagnosis not present

## 2017-01-11 DEATH — deceased

## 2017-03-31 ENCOUNTER — Ambulatory Visit (HOSPITAL_COMMUNITY): Payer: Self-pay | Admitting: Psychiatry

## 2018-03-27 IMAGING — CT CT HEAD W/O CM
3 series · 15 of 47 positions shown, 18 images · non-contrast
Comparison: 07/15/2015, 08/19/2014

CLINICAL DATA: Patient is stents and rigid, altered mental status

EXAM:
CT HEAD WITHOUT CONTRAST
TECHNIQUE: Contiguous axial images were obtained from the base of the skull
through the vertex without intravenous contrast.

[Series 2: head wo · axial · 0.40mm/px · z∈[+247,+377]mm · 9 of 32 slices shown, 12 images]
[im 3/32  brain]
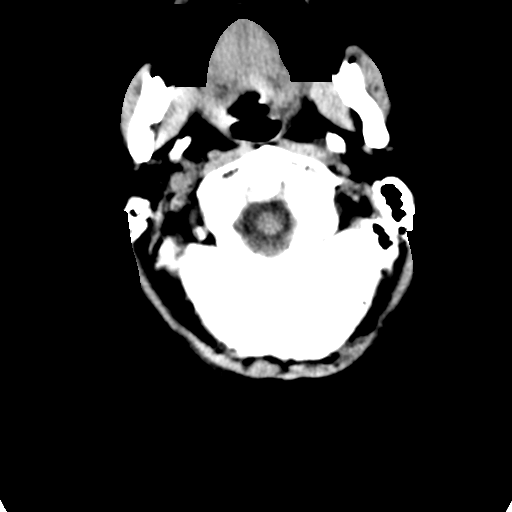
[im 3/32  bone]
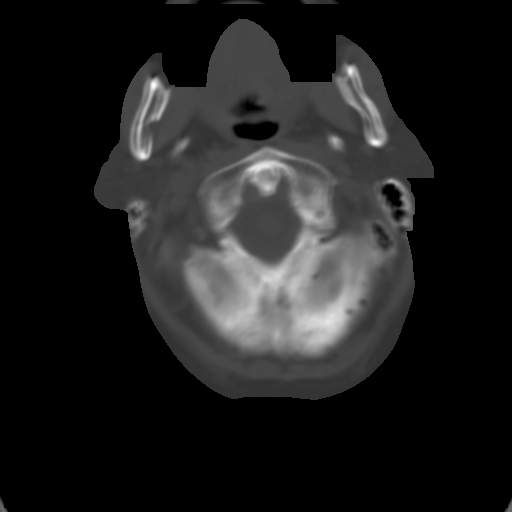
[im 6/32  brain]
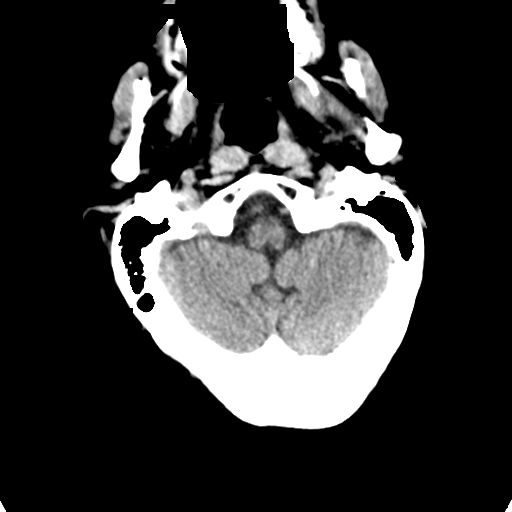
[im 9/32  brain]
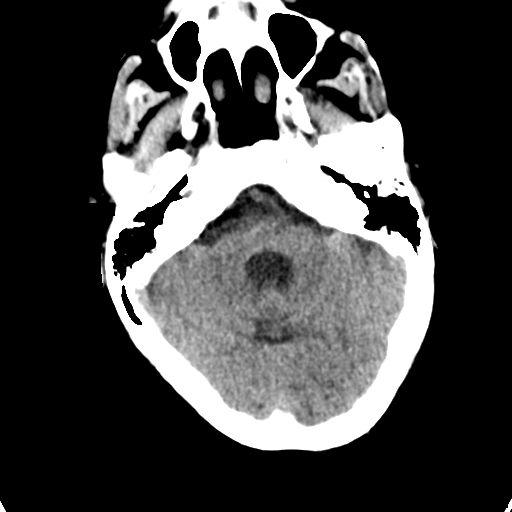
[im 12/32  brain]
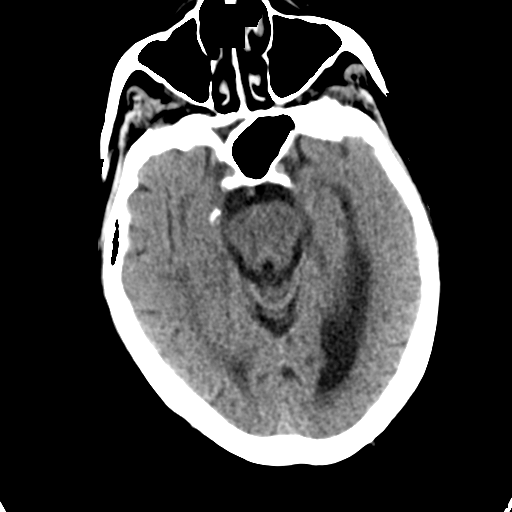
[im 17/32  brain]
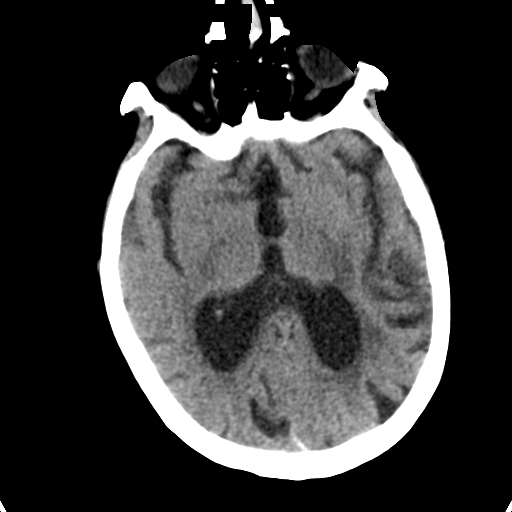
[im 17/32  bone]
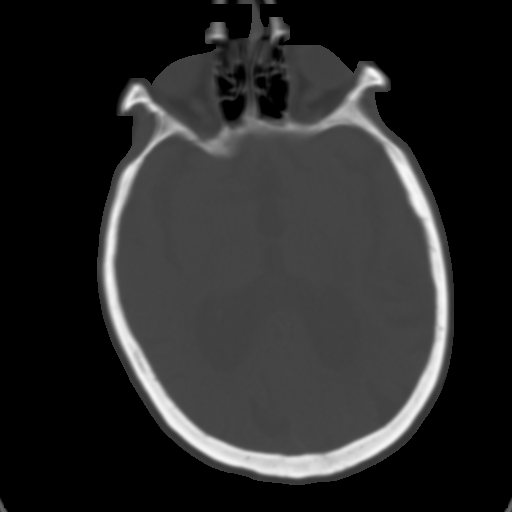
[im 20/32  brain]
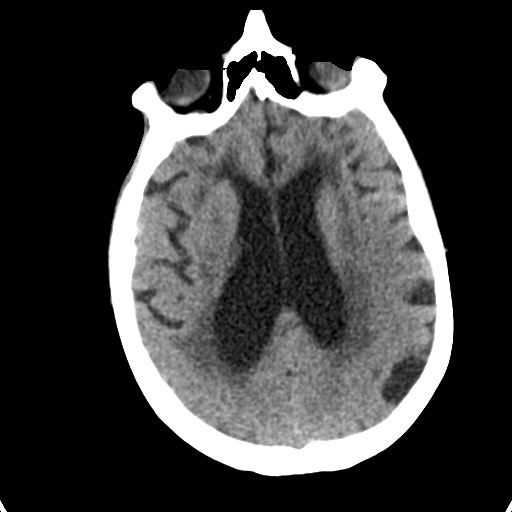
[im 23/32  brain]
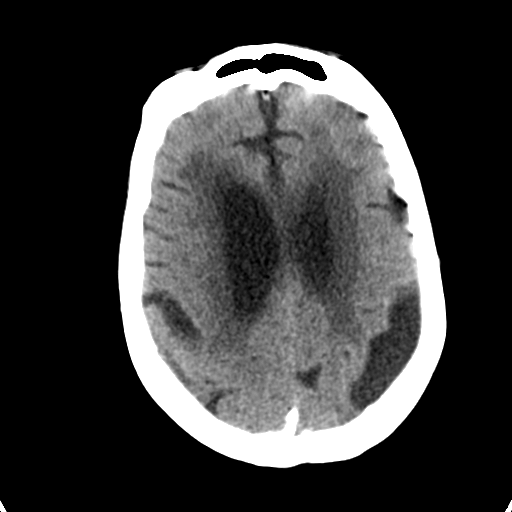
[im 26/32  brain]
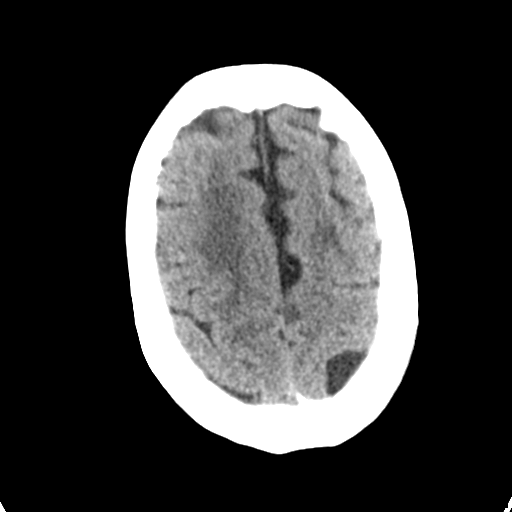
[im 29/32  brain]
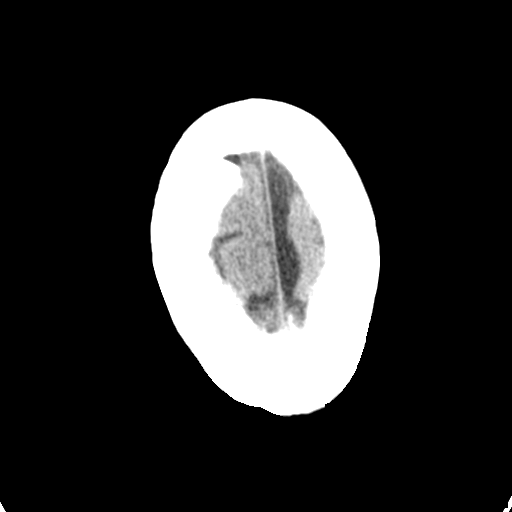
[im 29/32  bone]
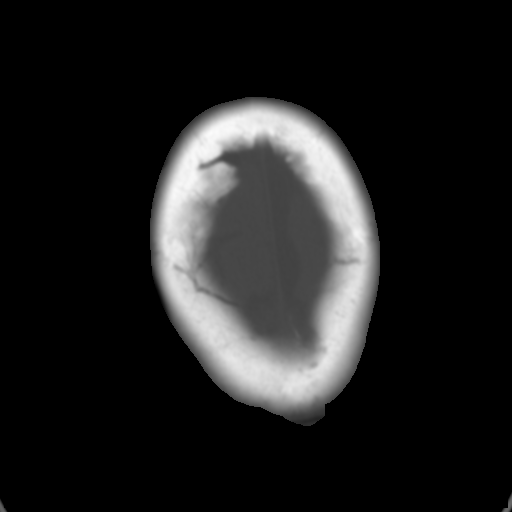

[Series 4: coronal soft tissue · coronal · 0.35mm/px · 3 of 70 slices shown]
[im 24/70  brain]
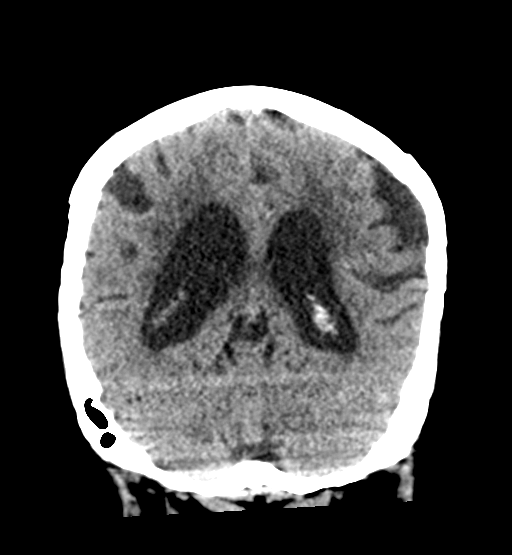
[im 31/70  brain]
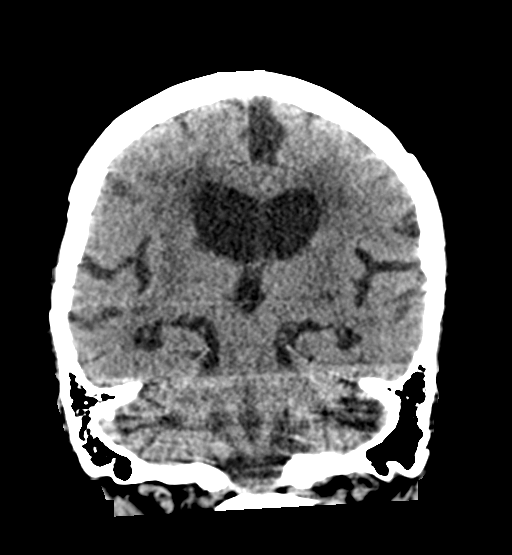
[im 39/70  brain]
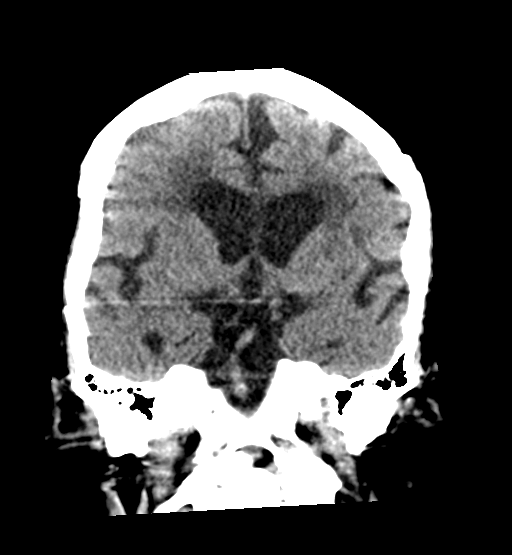

[Series 5: sagittal soft tissue · sagittal · 0.37mm/px · 3 of 61 slices shown]
[im 21/61  brain]
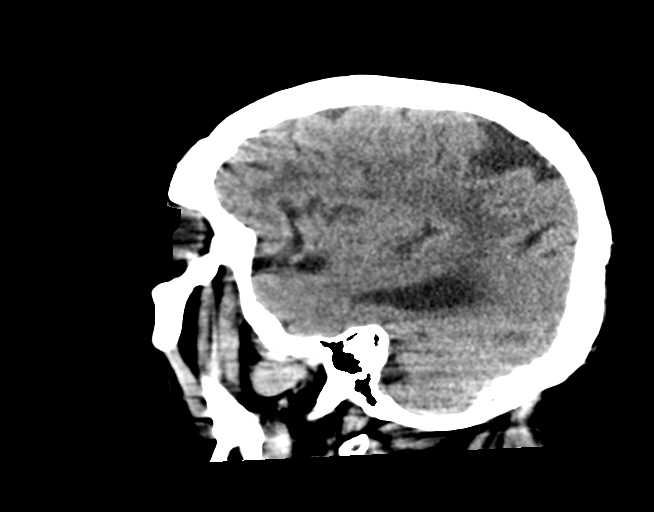
[im 31/61  brain]
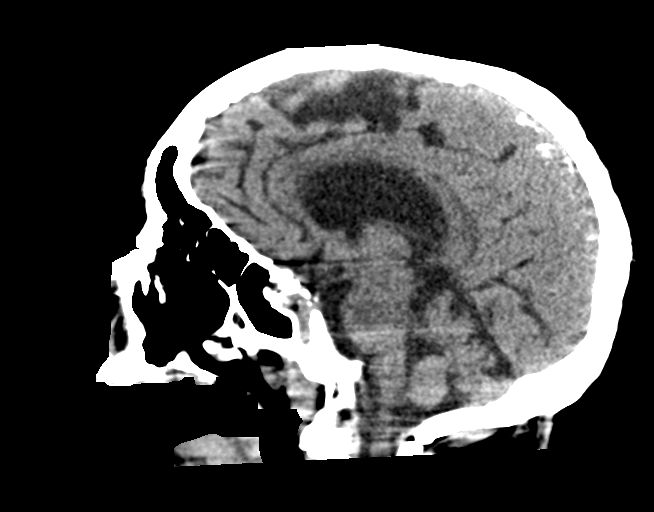
[im 41/61  brain]
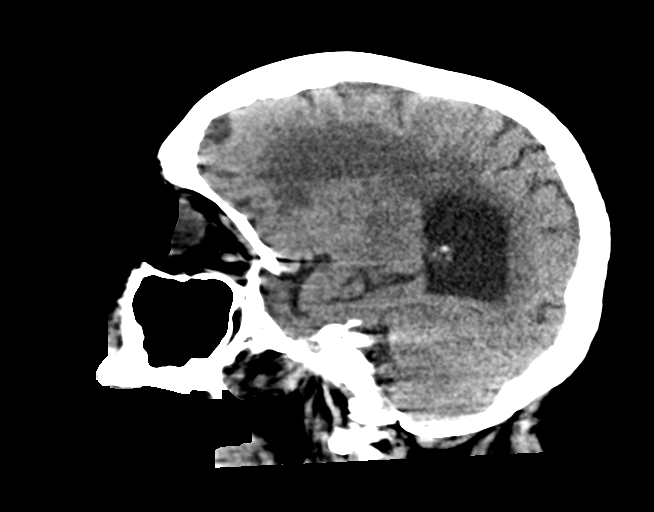

[15 of 47 positions shown; findings below may reference images not displayed]

FINDINGS: Brain: There is mild motion artifact. No large vessel territorial
infarction. No hemorrhage. No mass effect. Stable degree of
ventricular enlargement. Moderate atrophy. Moderate confluent white
matter presumed small vessel disease. Prominent extra-axial CSF
space along the left parietal convexity is unchanged allowing for
differences in head positioning.

Vascular: Calcifications present within the carotid arteries at the
skullbase. No hyperdense vessels.

Skull: Mastoid air cells are clear.  There is no fracture.

Sinuses/Orbits: Orbits similar compared to prior with left lens
implant.

Paranasal sinuses are grossly clear.

Other: None
IMPRESSION: 1. No definite CT evidence for acute intracranial abnormality
allowing for motion artifact.
2. Atrophy. Moderate periventricular presumed small vessel white
matter disease.

## 2018-03-27 IMAGING — DX DG CHEST 1V PORT
1 series · 1 of 1 positions shown · non-contrast
Comparison: Chest radiograph 07/17/2015

CLINICAL DATA: Altered mental status

EXAM:
PORTABLE CHEST 1 VIEW

[chest ap]
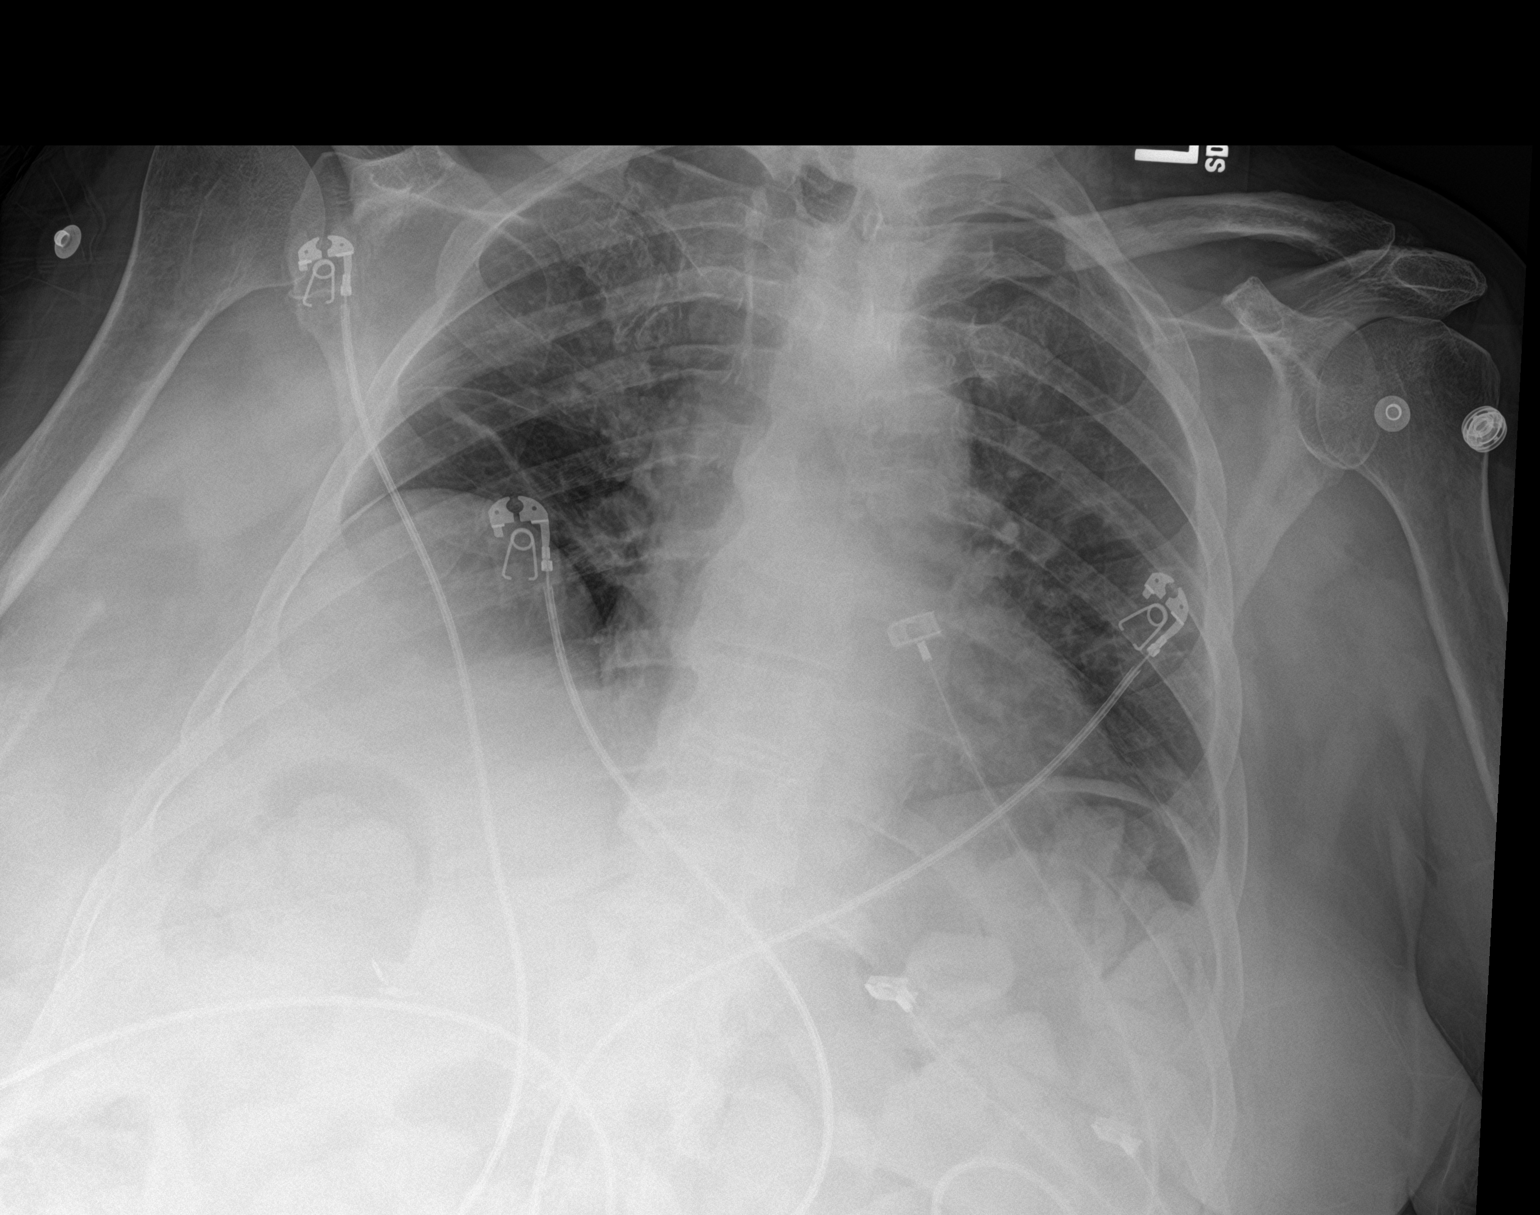

[1 of 1 positions shown; findings below may reference images not displayed]

FINDINGS: There is shallow lung inflation. No focal airspace consolidation.
There is right basilar atelectasis, increased from the prior study.
No pneumothorax or sizable pleural effusion. No pulmonary edema.
Unchanged cardiomediastinal contours.
IMPRESSION: Shallow lung inflation.  No focal consolidation or pulmonary edema.
